# Patient Record
Sex: Male | Born: 1950
Health system: Southern US, Community
[De-identification: ages and names within clinical notes are randomized; demographics above are authoritative.]

## PROBLEM LIST (undated history)

## (undated) DIAGNOSIS — H9319 Tinnitus, unspecified ear: Secondary | ICD-10-CM

## (undated) DIAGNOSIS — K219 Gastro-esophageal reflux disease without esophagitis: Secondary | ICD-10-CM

## (undated) DIAGNOSIS — L989 Disorder of the skin and subcutaneous tissue, unspecified: Secondary | ICD-10-CM

## (undated) DIAGNOSIS — I4891 Unspecified atrial fibrillation: Secondary | ICD-10-CM

## (undated) DIAGNOSIS — J45909 Unspecified asthma, uncomplicated: Secondary | ICD-10-CM

## (undated) DIAGNOSIS — H409 Unspecified glaucoma: Secondary | ICD-10-CM

## (undated) DIAGNOSIS — R519 Headache, unspecified: Secondary | ICD-10-CM

## (undated) DIAGNOSIS — K439 Ventral hernia without obstruction or gangrene: Secondary | ICD-10-CM

## (undated) DIAGNOSIS — R51 Headache: Secondary | ICD-10-CM

## (undated) DIAGNOSIS — E785 Hyperlipidemia, unspecified: Secondary | ICD-10-CM

## (undated) DIAGNOSIS — I428 Other cardiomyopathies: Secondary | ICD-10-CM

## (undated) DIAGNOSIS — I454 Nonspecific intraventricular block: Secondary | ICD-10-CM

## (undated) DIAGNOSIS — G459 Transient cerebral ischemic attack, unspecified: Secondary | ICD-10-CM

## (undated) HISTORY — DX: Tinnitus, unspecified ear: H93.19

## (undated) HISTORY — DX: Unspecified glaucoma: H40.9

## (undated) HISTORY — DX: Unspecified asthma, uncomplicated: J45.909

## (undated) HISTORY — DX: Unspecified atrial fibrillation: I48.91

## (undated) HISTORY — DX: Nonspecific intraventricular block: I45.4

## (undated) HISTORY — PX: TONSILLECTOMY: SUR1361

## (undated) HISTORY — DX: Gastro-esophageal reflux disease without esophagitis: K21.9

## (undated) HISTORY — DX: Disorder of the skin and subcutaneous tissue, unspecified: L98.9

## (undated) HISTORY — PX: MOLE REMOVAL: SHX2046

---
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
## 1998-11-02 ENCOUNTER — Encounter: Payer: Self-pay | Admitting: Emergency Medicine

## 1998-11-02 ENCOUNTER — Inpatient Hospital Stay (HOSPITAL_COMMUNITY): Admission: EM | Admit: 1998-11-02 | Discharge: 1998-11-11 | Payer: Self-pay | Admitting: Emergency Medicine

## 1998-11-03 ENCOUNTER — Encounter: Payer: Self-pay | Admitting: Pulmonary Disease

## 1998-11-05 ENCOUNTER — Encounter: Payer: Self-pay | Admitting: Neurology

## 1998-11-05 ENCOUNTER — Encounter: Payer: Self-pay | Admitting: Pulmonary Disease

## 1999-02-27 ENCOUNTER — Ambulatory Visit (HOSPITAL_COMMUNITY): Admission: RE | Admit: 1999-02-27 | Discharge: 1999-02-27 | Payer: Self-pay | Admitting: Unknown Physician Specialty

## 2003-07-28 ENCOUNTER — Emergency Department (HOSPITAL_COMMUNITY): Admission: EM | Admit: 2003-07-28 | Discharge: 2003-07-28 | Payer: Self-pay | Admitting: Emergency Medicine

## 2003-07-28 ENCOUNTER — Encounter: Payer: Self-pay | Admitting: Emergency Medicine

## 2016-08-04 DIAGNOSIS — Z23 Encounter for immunization: Secondary | ICD-10-CM | POA: Diagnosis not present

## 2016-12-11 HISTORY — PX: OTHER SURGICAL HISTORY: SHX169

## 2017-01-21 DIAGNOSIS — H527 Unspecified disorder of refraction: Secondary | ICD-10-CM | POA: Diagnosis not present

## 2017-01-21 DIAGNOSIS — H524 Presbyopia: Secondary | ICD-10-CM | POA: Diagnosis not present

## 2017-05-27 DIAGNOSIS — K439 Ventral hernia without obstruction or gangrene: Secondary | ICD-10-CM | POA: Diagnosis not present

## 2017-06-25 ENCOUNTER — Other Ambulatory Visit: Payer: Self-pay | Admitting: General Surgery

## 2017-06-25 DIAGNOSIS — K429 Umbilical hernia without obstruction or gangrene: Secondary | ICD-10-CM | POA: Diagnosis not present

## 2017-07-01 DIAGNOSIS — Z0000 Encounter for general adult medical examination without abnormal findings: Secondary | ICD-10-CM | POA: Diagnosis not present

## 2017-07-01 DIAGNOSIS — Z1329 Encounter for screening for other suspected endocrine disorder: Secondary | ICD-10-CM | POA: Diagnosis not present

## 2017-07-01 DIAGNOSIS — E559 Vitamin D deficiency, unspecified: Secondary | ICD-10-CM | POA: Diagnosis not present

## 2017-07-01 DIAGNOSIS — E785 Hyperlipidemia, unspecified: Secondary | ICD-10-CM | POA: Diagnosis not present

## 2017-07-01 DIAGNOSIS — N400 Enlarged prostate without lower urinary tract symptoms: Secondary | ICD-10-CM | POA: Diagnosis not present

## 2017-07-09 DIAGNOSIS — R972 Elevated prostate specific antigen [PSA]: Secondary | ICD-10-CM | POA: Diagnosis not present

## 2017-07-17 NOTE — Pre-Procedure Instructions (Signed)
Randall Best  07/17/2017     No Pharmacies Listed   Your procedure is scheduled on July 22, 2017.  Report to [facility] Admitting at 800 AM.  Call this number if you have problems the morning of surgery:  [phone]   Remember:  Do not eat food or drink liquids after midnight.  Take these medicines the morning of surgery with A SIP OF WATER diphendhydramine (benadryl)-if needed, fluticasone (flonase) nasal spray-if needed  7 days prior to surgery STOP taking any Aspirin, Aleve, Naproxen, Ibuprofen, Motrin, Advil, Goody's, BC's, all herbal medications, fish oil, and all vitamins   Do not wear jewelry, make-up or nail polish.  Do not wear lotions, powders, or perfumes, or deoderant.             Men may shave face and neck.  Do not bring valuables to the hospital.  [facility] is not responsible for any belongings or valuables.  Contacts, dentures or bridgework may not be worn into surgery.  Leave your suitcase in the car.  After surgery it may be brought to your room.  For patients admitted to the hospital, discharge time will be determined by your treatment team.  Patients discharged the day of surgery will not be allowed to drive home.   Special instructions:   - Preparing For Surgery  Before surgery, you can play an important role. Because skin is not sterile, your skin needs to be as free of germs as possible. You can reduce the number of germs on your skin by washing with CHG (chlorahexidine gluconate) Soap before surgery.  CHG is an antiseptic cleaner which kills germs and bonds with the skin to continue killing germs even after washing.  Please do not use if you have an allergy to CHG or antibacterial soaps. If your skin becomes reddened/irritated stop using the CHG.  Do not shave (including legs and underarms) for at least 48 hours prior to first CHG shower. It is OK to shave your face.  Please follow these instructions  carefully.   1. Shower the NIGHT BEFORE SURGERY and the MORNING OF SURGERY with CHG.   2. If you chose to wash your hair, wash your hair first as usual with your normal shampoo.  3. After you shampoo, rinse your hair and body thoroughly to remove the shampoo.  4. Use CHG as you would any other liquid soap. You can apply CHG directly to the skin and wash gently with a scrungie or a clean washcloth.   5. Apply the CHG Soap to your body ONLY FROM THE NECK DOWN.  Do not use on open wounds or open sores. Avoid contact with your eyes, ears, mouth and genitals (private parts). Wash genitals (private parts) with your normal soap.  6. Wash thoroughly, paying special attention to the area where your surgery will be performed.  7. Thoroughly rinse your body with warm water from the neck down.  8. DO NOT shower/wash with your normal soap after using and rinsing off the CHG Soap.  9. Pat yourself dry with a CLEAN TOWEL.   10. Wear CLEAN PAJAMAS   11. Place CLEAN SHEETS on your bed the night of your first shower and DO NOT SLEEP WITH PETS.    Day of Surgery: Do not apply any deodorants/lotions. Please wear clean clothes to the hospital/surgery center.     Please read over the following fact sheets that you were given. Pain Booklet, Coughing and Deep Breathing and  Surgical Site Infection Prevention

## 2017-07-20 ENCOUNTER — Encounter (HOSPITAL_COMMUNITY)
Admission: RE | Admit: 2017-07-20 | Discharge: 2017-07-20 | Disposition: A | Discharge status: Home / Self Care | Payer: BLUE CROSS/BLUE SHIELD | Source: Ambulatory Visit | Attending: General Surgery | Admitting: General Surgery

## 2017-07-20 ENCOUNTER — Encounter (HOSPITAL_COMMUNITY): Payer: Self-pay

## 2017-07-20 DIAGNOSIS — Z888 Allergy status to other drugs, medicaments and biological substances status: Secondary | ICD-10-CM | POA: Diagnosis not present

## 2017-07-20 DIAGNOSIS — Z23 Encounter for immunization: Secondary | ICD-10-CM | POA: Diagnosis not present

## 2017-07-20 DIAGNOSIS — K567 Ileus, unspecified: Secondary | ICD-10-CM | POA: Diagnosis not present

## 2017-07-20 DIAGNOSIS — K439 Ventral hernia without obstruction or gangrene: Secondary | ICD-10-CM | POA: Diagnosis not present

## 2017-07-20 DIAGNOSIS — G8918 Other acute postprocedural pain: Secondary | ICD-10-CM | POA: Diagnosis not present

## 2017-07-20 DIAGNOSIS — E785 Hyperlipidemia, unspecified: Secondary | ICD-10-CM | POA: Diagnosis not present

## 2017-07-20 HISTORY — DX: Hyperlipidemia, unspecified: E78.5

## 2017-07-20 HISTORY — DX: Headache, unspecified: R51.9

## 2017-07-20 HISTORY — DX: Headache: R51

## 2017-07-20 LAB — CBC
HCT: 44.5 % (ref 39.0–52.0)
Hemoglobin: 14.3 g/dL (ref 13.0–17.0)
MCH: 29.2 pg (ref 26.0–34.0)
MCHC: 32.1 g/dL (ref 30.0–36.0)
MCV: 91 fL (ref 78.0–100.0)
PLATELETS: 191 10*3/uL (ref 150–400)
RBC: 4.89 MIL/uL (ref 4.22–5.81)
RDW: 13.6 % (ref 11.5–15.5)
WBC: 7.1 10*3/uL (ref 4.0–10.5)

## 2017-07-20 LAB — BASIC METABOLIC PANEL
Anion gap: 7 (ref 5–15)
BUN: 12 mg/dL (ref 6–20)
CO2: 25 mmol/L (ref 22–32)
CREATININE: 1.04 mg/dL (ref 0.61–1.24)
Calcium: 9.1 mg/dL (ref 8.9–10.3)
Chloride: 108 mmol/L (ref 101–111)
GFR calc Af Amer: >60 mL/min (ref >60)
GLUCOSE: 103 mg/dL — AB (ref 65–99)
Potassium: 4 mmol/L (ref 3.5–5.1)
SODIUM: 140 mmol/L (ref 135–145)

## 2017-07-20 NOTE — Pre-Procedure Instructions (Addendum)
Randall Best  07/20/2017     No Pharmacies Listed   Your procedure is scheduled on July 22, 2017.  Report to [facility] Admitting at 800 AM.  Call this number if you have problems the morning of surgery:  [phone]   Remember:  Do not eat food or drink liquids after midnight. drink boost breeze drink am of surgery at 600am  Take these medicines the morning of surgery with A SIP OF WATER diphendhydramine (benadryl)-if needed, fluticasone (flonase) nasal spray-if needed  Prior surgery STOP(today) taking any Aspirin, Aleve, Naproxen, Ibuprofen, Motrin, Advil, Goody's, BC's, all herbal medications, fish oil, and all vitamins   Do not wear jewelry, make-up or nail polish.  Do not wear lotions, powders, or perfumes, or deoderant.             Men may shave face and neck.  Do not bring valuables to the hospital.  [facility] is not responsible for any belongings or valuables.  Contacts, dentures or bridgework may not be worn into surgery.  Leave your suitcase in the car.  After surgery it may be brought to your room.  For patients admitted to the hospital, discharge time will be determined by your treatment team.  Patients discharged the day of surgery will not be allowed to drive home.   Special instructions:   [place]- Preparing For Surgery  Before surgery, you can play an important role. Because skin is not sterile, your skin needs to be as free of germs as possible. You can reduce the number of germs on your skin by washing with CHG (chlorahexidine gluconate) Soap before surgery.  CHG is an antiseptic cleaner which kills germs and bonds with the skin to continue killing germs even after washing.  Please do not use if you have an allergy to CHG or antibacterial soaps. If your skin becomes reddened/irritated stop using the CHG.  Do not shave (including legs and underarms) for at least 48 hours prior to first CHG shower. It is OK to shave your  face.  Please follow these instructions carefully.   1. Shower the NIGHT BEFORE SURGERY and the MORNING OF SURGERY with CHG.   2. If you chose to wash your hair, wash your hair first as usual with your normal shampoo.  3. After you shampoo, rinse your hair and body thoroughly to remove the shampoo.  4. Use CHG as you would any other liquid soap. You can apply CHG directly to the skin and wash gently with a scrungie or a clean washcloth.   5. Apply the CHG Soap to your body ONLY FROM THE NECK DOWN.  Do not use on open wounds or open sores. Avoid contact with your eyes, ears, mouth and genitals (private parts). Wash genitals (private parts) with your normal soap.  6. Wash thoroughly, paying special attention to the area where your surgery will be performed.  7. Thoroughly rinse your body with warm water from the neck down.  8. DO NOT shower/wash with your normal soap after using and rinsing off the CHG Soap.  9. Pat yourself dry with a CLEAN TOWEL.   10. Wear CLEAN PAJAMAS   11. Place CLEAN SHEETS on your bed the night of your first shower and DO NOT SLEEP WITH PETS.    Day of Surgery: Do not apply any deodorants/lotions. Please wear clean clothes to the hospital/surgery center.     Please read over the following fact sheets that you were given. Pain  Booklet, Coughing and Deep Breathing and Surgical Site Infection Prevention

## 2017-07-21 ENCOUNTER — Encounter (HOSPITAL_COMMUNITY): Payer: Self-pay

## 2017-07-21 NOTE — Progress Notes (Signed)
Anesthesia Chart Review:  Pt is a 66 year old male scheduled for open ventral hernia repair with mesh on 07/22/2017 with [name], MD  - PCP is Starlyn Skeans, PA  PMH includes:  Headache. Never smoker. BMI 30  Medications reviewed.  BP 117/84   Pulse 73   Temp 36.8 C (Oral)   Resp 18   Ht 5\' 10"  (1.778 m)   Wt 207 lb 4 oz (94 kg)   SpO2 100%   BMI 29.74 kg/m    Preoperative labs reviewed.  EKG 07/20/17: Sinus bradycardia (50 bpm) with 1st degree A-V block. Incomplete LBBB. Nonspecific T wave abnormality  If no changes, I anticipate pt can proceed with surgery as scheduled.   Willeen Cass, FNP-BC [facility] Short Stay Surgical Center/Anesthesiology Phone: [phone] 07/21/2017 10:14 AM

## 2017-07-22 ENCOUNTER — Inpatient Hospital Stay (HOSPITAL_COMMUNITY): Payer: BLUE CROSS/BLUE SHIELD | Admitting: Emergency Medicine

## 2017-07-22 ENCOUNTER — Encounter (HOSPITAL_COMMUNITY)
Admission: RE | Disposition: A | Discharge status: Home / Self Care | Payer: Self-pay | Source: Ambulatory Visit | Attending: General Surgery

## 2017-07-22 ENCOUNTER — Inpatient Hospital Stay (HOSPITAL_COMMUNITY)
Admission: RE | Admit: 2017-07-22 | Discharge: 2017-07-27 | DRG: 354 | Disposition: A | Discharge status: Home / Self Care | Payer: BLUE CROSS/BLUE SHIELD | Source: Ambulatory Visit | Attending: General Surgery | Admitting: General Surgery

## 2017-07-22 ENCOUNTER — Inpatient Hospital Stay (HOSPITAL_COMMUNITY): Payer: BLUE CROSS/BLUE SHIELD | Admitting: Anesthesiology

## 2017-07-22 ENCOUNTER — Encounter (HOSPITAL_COMMUNITY): Payer: Self-pay

## 2017-07-22 DIAGNOSIS — K567 Ileus, unspecified: Secondary | ICD-10-CM | POA: Diagnosis not present

## 2017-07-22 DIAGNOSIS — Z888 Allergy status to other drugs, medicaments and biological substances status: Secondary | ICD-10-CM | POA: Diagnosis not present

## 2017-07-22 DIAGNOSIS — Z23 Encounter for immunization: Secondary | ICD-10-CM

## 2017-07-22 DIAGNOSIS — G8918 Other acute postprocedural pain: Secondary | ICD-10-CM | POA: Diagnosis not present

## 2017-07-22 DIAGNOSIS — K439 Ventral hernia without obstruction or gangrene: Principal | ICD-10-CM | POA: Diagnosis present

## 2017-07-22 DIAGNOSIS — E785 Hyperlipidemia, unspecified: Secondary | ICD-10-CM | POA: Diagnosis present

## 2017-07-22 HISTORY — DX: Ventral hernia without obstruction or gangrene: K43.9

## 2017-07-22 HISTORY — PX: VENTRAL HERNIA REPAIR: SHX424

## 2017-07-22 HISTORY — PX: INSERTION OF MESH: SHX5868

## 2017-07-22 SURGERY — REPAIR, HERNIA, VENTRAL
Anesthesia: General | Site: Abdomen

## 2017-07-22 MED ORDER — SUGAMMADEX SODIUM 200 MG/2ML IV SOLN
INTRAVENOUS | Status: DC | PRN
  Administered 2017-07-22: 350 mg via INTRAVENOUS

## 2017-07-22 MED ORDER — LACTATED RINGERS IV SOLN
INTRAVENOUS | Status: DC | PRN
  Administered 2017-07-22 (×2): via INTRAVENOUS

## 2017-07-22 MED ORDER — MIDAZOLAM HCL 5 MG/5ML IJ SOLN
INTRAMUSCULAR | Status: DC | PRN
  Administered 2017-07-22: 2 mg via INTRAVENOUS

## 2017-07-22 MED ORDER — OXYCODONE HCL 5 MG PO TABS: 5.0000 mg | ORAL_TABLET | Freq: Once | ORAL | Status: DC | PRN

## 2017-07-22 MED ORDER — LIDOCAINE 2% (20 MG/ML) 5 ML SYRINGE
INTRAMUSCULAR | Status: AC
  Filled 2017-07-22: qty 25

## 2017-07-22 MED ORDER — PHENYLEPHRINE 40 MCG/ML (10ML) SYRINGE FOR IV PUSH (FOR BLOOD PRESSURE SUPPORT)
PREFILLED_SYRINGE | INTRAVENOUS | Status: AC
  Filled 2017-07-22: qty 20

## 2017-07-22 MED ORDER — ONDANSETRON 4 MG PO TBDP: 4.0000 mg | ORAL_TABLET | Freq: Four times a day (QID) | ORAL | Status: DC | PRN

## 2017-07-22 MED ORDER — ONDANSETRON HCL 4 MG/2ML IJ SOLN
INTRAMUSCULAR | Status: AC
  Filled 2017-07-22: qty 8

## 2017-07-22 MED ORDER — GABAPENTIN 300 MG PO CAPS
300.0000 mg | ORAL_CAPSULE | ORAL | Status: AC
  Administered 2017-07-22: 300 mg via ORAL
  Filled 2017-07-22: qty 1

## 2017-07-22 MED ORDER — FENTANYL CITRATE (PF) 100 MCG/2ML IJ SOLN
INTRAMUSCULAR | Status: AC
  Administered 2017-07-22: 100 ug
  Filled 2017-07-22: qty 2

## 2017-07-22 MED ORDER — SIMETHICONE 80 MG PO CHEW
40.0000 mg | CHEWABLE_TABLET | Freq: Four times a day (QID) | ORAL | Status: DC | PRN
  Administered 2017-07-23 – 2017-07-24 (×2): 40 mg via ORAL
  Filled 2017-07-22 (×2): qty 1

## 2017-07-22 MED ORDER — PHENYLEPHRINE HCL 10 MG/ML IJ SOLN
INTRAMUSCULAR | Status: DC | PRN
  Administered 2017-07-22 (×2): 80 ug via INTRAVENOUS

## 2017-07-22 MED ORDER — ONDANSETRON HCL 4 MG/2ML IJ SOLN
INTRAMUSCULAR | Status: DC | PRN
  Administered 2017-07-22: 4 mg via INTRAVENOUS

## 2017-07-22 MED ORDER — FLUTICASONE PROPIONATE 50 MCG/ACT NA SUSP: 1.0000 | Freq: Every day | NASAL | Status: DC | PRN

## 2017-07-22 MED ORDER — MIDAZOLAM HCL 2 MG/2ML IJ SOLN
INTRAMUSCULAR | Status: AC
  Filled 2017-07-22: qty 2

## 2017-07-22 MED ORDER — PROPOFOL 10 MG/ML IV BOLUS
INTRAVENOUS | Status: DC | PRN
  Administered 2017-07-22: 160 mg via INTRAVENOUS

## 2017-07-22 MED ORDER — 0.9 % SODIUM CHLORIDE (POUR BTL) OPTIME
TOPICAL | Status: DC | PRN
  Administered 2017-07-22: 1000 mL

## 2017-07-22 MED ORDER — OXYCODONE HCL 5 MG PO TABS
5.0000 mg | ORAL_TABLET | ORAL | Status: DC | PRN
  Administered 2017-07-23 – 2017-07-26 (×14): 10 mg via ORAL
  Filled 2017-07-22 (×16): qty 2

## 2017-07-22 MED ORDER — FENTANYL CITRATE (PF) 100 MCG/2ML IJ SOLN
INTRAMUSCULAR | Status: DC | PRN
  Administered 2017-07-22: 100 ug via INTRAVENOUS
  Administered 2017-07-22 (×3): 50 ug via INTRAVENOUS

## 2017-07-22 MED ORDER — CEFAZOLIN SODIUM-DEXTROSE 2-4 GM/100ML-% IV SOLN
2.0000 g | INTRAVENOUS | Status: AC
  Administered 2017-07-22: 2 g via INTRAVENOUS
  Filled 2017-07-22: qty 100

## 2017-07-22 MED ORDER — LIDOCAINE HCL (CARDIAC) 20 MG/ML IV SOLN
INTRAVENOUS | Status: DC | PRN
  Administered 2017-07-22: 50 mg via INTRAVENOUS

## 2017-07-22 MED ORDER — ROCURONIUM BROMIDE 100 MG/10ML IV SOLN
INTRAVENOUS | Status: DC | PRN
  Administered 2017-07-22 (×2): 10 mg via INTRAVENOUS
  Administered 2017-07-22: 50 mg via INTRAVENOUS

## 2017-07-22 MED ORDER — DEXAMETHASONE SODIUM PHOSPHATE 10 MG/ML IJ SOLN
INTRAMUSCULAR | Status: AC
  Filled 2017-07-22: qty 3

## 2017-07-22 MED ORDER — MORPHINE SULFATE (PF) 4 MG/ML IV SOLN
2.0000 mg | INTRAVENOUS | Status: DC | PRN
  Administered 2017-07-22 – 2017-07-23 (×8): 2 mg via INTRAVENOUS
  Filled 2017-07-22 (×8): qty 1

## 2017-07-22 MED ORDER — DEXAMETHASONE SODIUM PHOSPHATE 10 MG/ML IJ SOLN
INTRAMUSCULAR | Status: DC | PRN
  Administered 2017-07-22 (×2): 10 mg via INTRAVENOUS

## 2017-07-22 MED ORDER — CHLORHEXIDINE GLUCONATE CLOTH 2 % EX PADS
6.0000 | MEDICATED_PAD | Freq: Once | CUTANEOUS | Status: DC
  Administered 2017-07-22: 6 via TOPICAL

## 2017-07-22 MED ORDER — BUPIVACAINE-EPINEPHRINE (PF) 0.5% -1:200000 IJ SOLN
INTRAMUSCULAR | Status: DC | PRN
  Administered 2017-07-22 (×2): 20 mL via PERINEURAL

## 2017-07-22 MED ORDER — SODIUM CHLORIDE 0.9 % IV SOLN
INTRAVENOUS | Status: DC
  Administered 2017-07-22: 13:00:00 via INTRAVENOUS

## 2017-07-22 MED ORDER — HEPARIN SODIUM (PORCINE) 5000 UNIT/ML IJ SOLN
5000.0000 [IU] | Freq: Once | INTRAMUSCULAR | Status: AC
  Administered 2017-07-22: 5000 [IU] via SUBCUTANEOUS
  Filled 2017-07-22: qty 1

## 2017-07-22 MED ORDER — EPHEDRINE 5 MG/ML INJ
INTRAVENOUS | Status: AC
  Filled 2017-07-22: qty 20

## 2017-07-22 MED ORDER — SODIUM CHLORIDE 0.9 % IJ SOLN
INTRAMUSCULAR | Status: AC
  Filled 2017-07-22: qty 10

## 2017-07-22 MED ORDER — ACETAMINOPHEN 500 MG PO TABS
1000.0000 mg | ORAL_TABLET | Freq: Four times a day (QID) | ORAL | Status: DC
  Administered 2017-07-22 – 2017-07-27 (×20): 1000 mg via ORAL
  Filled 2017-07-22 (×18): qty 2

## 2017-07-22 MED ORDER — ACETAMINOPHEN 500 MG PO TABS
1000.0000 mg | ORAL_TABLET | ORAL | Status: AC
  Administered 2017-07-22: 1000 mg via ORAL
  Filled 2017-07-22: qty 2

## 2017-07-22 MED ORDER — FENTANYL CITRATE (PF) 250 MCG/5ML IJ SOLN
INTRAMUSCULAR | Status: AC
  Filled 2017-07-22: qty 5

## 2017-07-22 MED ORDER — ONDANSETRON HCL 4 MG/2ML IJ SOLN: 4.0000 mg | Freq: Four times a day (QID) | INTRAMUSCULAR | Status: DC | PRN

## 2017-07-22 MED ORDER — FENTANYL CITRATE (PF) 100 MCG/2ML IJ SOLN
25.0000 ug | INTRAMUSCULAR | Status: DC | PRN
  Administered 2017-07-22 (×4): 25 ug via INTRAVENOUS

## 2017-07-22 MED ORDER — OXYCODONE HCL 5 MG/5ML PO SOLN: 5.0000 mg | Freq: Once | ORAL | Status: DC | PRN

## 2017-07-22 MED ORDER — METHOCARBAMOL 500 MG PO TABS: 500.0000 mg | ORAL_TABLET | Freq: Four times a day (QID) | ORAL | Status: DC | PRN

## 2017-07-22 MED ORDER — ENOXAPARIN SODIUM 40 MG/0.4ML ~~LOC~~ SOLN
40.0000 mg | SUBCUTANEOUS | Status: DC
  Administered 2017-07-22 – 2017-07-27 (×5): 40 mg via SUBCUTANEOUS
  Filled 2017-07-22 (×5): qty 0.4

## 2017-07-22 MED ORDER — PROPOFOL 10 MG/ML IV BOLUS
INTRAVENOUS | Status: AC
  Filled 2017-07-22: qty 20

## 2017-07-22 MED ORDER — ENSURE PRE-SURGERY PO LIQD
592.0000 mL | Freq: Once | ORAL | Status: DC
  Filled 2017-07-22: qty 592

## 2017-07-22 SURGICAL SUPPLY — 38 items
BINDER ABDOMINAL 12 ML 46-62 (SOFTGOODS) ×2 IMPLANT
BIOPATCH RED 1 DISK 7.0 (GAUZE/BANDAGES/DRESSINGS) ×2 IMPLANT
BLADE CLIPPER SURG (BLADE) ×2 IMPLANT
BLADE SURG 11 STRL SS (BLADE) ×2 IMPLANT
CANISTER SUCT 3000ML PPV (MISCELLANEOUS) ×2 IMPLANT
CHLORAPREP W/TINT 26ML (MISCELLANEOUS) ×2 IMPLANT
COVER SURGICAL LIGHT HANDLE (MISCELLANEOUS) ×2 IMPLANT
DERMABOND ADVANCED (GAUZE/BANDAGES/DRESSINGS) ×1
DERMABOND ADVANCED .7 DNX12 (GAUZE/BANDAGES/DRESSINGS) ×1 IMPLANT
DEVICE TROCAR PUNCTURE CLOSURE (ENDOMECHANICALS) ×2 IMPLANT
DRAIN CHANNEL 19F RND (DRAIN) ×2 IMPLANT
DRAPE LAPAROSCOPIC ABDOMINAL (DRAPES) ×2 IMPLANT
DRSG OPSITE POSTOP 4X8 (GAUZE/BANDAGES/DRESSINGS) ×2 IMPLANT
DRSG TEGADERM 4X4.75 (GAUZE/BANDAGES/DRESSINGS) ×4 IMPLANT
ELECT CAUTERY BLADE 6.4 (BLADE) ×2 IMPLANT
ELECT REM PT RETURN 9FT ADLT (ELECTROSURGICAL) ×2
ELECTRODE REM PT RTRN 9FT ADLT (ELECTROSURGICAL) ×1 IMPLANT
EVACUATOR SILICONE 100CC (DRAIN) ×2 IMPLANT
GLOVE BIO SURGEON STRL SZ7 (GLOVE) ×2 IMPLANT
GLOVE BIOGEL PI IND STRL 7.5 (GLOVE) ×1 IMPLANT
GLOVE BIOGEL PI INDICATOR 7.5 (GLOVE) ×1
GOWN STRL REUS W/ TWL LRG LVL3 (GOWN DISPOSABLE) ×2 IMPLANT
GOWN STRL REUS W/TWL LRG LVL3 (GOWN DISPOSABLE) ×2
KIT BASIN OR (CUSTOM PROCEDURE TRAY) ×2 IMPLANT
KIT ROOM TURNOVER OR (KITS) ×2 IMPLANT
MESH HERNIA 10X14 SHEET (Mesh General) ×2 IMPLANT
NS IRRIG 1000ML POUR BTL (IV SOLUTION) ×2 IMPLANT
PACK GENERAL/GYN (CUSTOM PROCEDURE TRAY) ×2 IMPLANT
PAD ARMBOARD 7.5X6 YLW CONV (MISCELLANEOUS) ×2 IMPLANT
STAPLER VISISTAT 35W (STAPLE) ×2 IMPLANT
SUT ETHIBOND NAB CT1 #1 30IN (SUTURE) ×4 IMPLANT
SUT ETHILON 2 0 FS 18 (SUTURE) ×2 IMPLANT
SUT PDS AB 1 TP1 96 (SUTURE) ×4 IMPLANT
SUT VIC AB 0 CT1 27 (SUTURE) ×6
SUT VIC AB 0 CT1 27XBRD ANBCTR (SUTURE) ×6 IMPLANT
SUT VIC AB 3-0 SH 18 (SUTURE) ×2 IMPLANT
TOWEL OR 17X24 6PK STRL BLUE (TOWEL DISPOSABLE) ×2 IMPLANT
TRAY FOLEY W/METER SILVER 14FR (SET/KITS/TRAYS/PACK) ×2 IMPLANT

## 2017-07-22 NOTE — Anesthesia Procedure Notes (Signed)
Procedure Name: Intubation Date/Time: 07/22/2017 8:43 AM Performed by: Neldon Newport Pre-anesthesia Checklist: Timeout performed, Patient being monitored, Suction available, Emergency Drugs available and Patient identified Patient Re-evaluated:Patient Re-evaluated prior to induction Oxygen Delivery Method: Circle system utilized Preoxygenation: Pre-oxygenation with 100% oxygen Induction Type: IV induction Ventilation: Mask ventilation without difficulty and Oral airway inserted - appropriate to patient size Laryngoscope Size: Mac and 3 Grade View: Grade I Tube type: Oral Tube size: 7.5 mm Number of attempts: 1 Placement Confirmation: breath sounds checked- equal and bilateral,  positive ETCO2 and ETT inserted through vocal cords under direct vision Secured at: 22 cm Tube secured with: Tape Dental Injury: Teeth and Oropharynx as per pre-operative assessment

## 2017-07-22 NOTE — Anesthesia Postprocedure Evaluation (Signed)
Anesthesia Post Note  Patient: Randall Best  Procedure(s) Performed: Procedure(s) (LRB): OPEN VENTRAL HERNIA REPAIR WITH MESH ERAS PATHWAY (N/A) INSERTION OF MESH (N/A)     Patient location during evaluation: PACU Anesthesia Type: General Level of consciousness: awake and alert Pain management: pain level controlled Vital Signs Assessment: post-procedure vital signs reviewed and stable Respiratory status: spontaneous breathing, nonlabored ventilation, respiratory function stable and patient connected to nasal cannula oxygen Cardiovascular status: blood pressure returned to baseline and stable Postop Assessment: no signs of nausea or vomiting Anesthetic complications: no    Last Vitals:  Vitals:   07/22/17 1208 07/22/17 1252  BP: 123/87 (!) 117/101  Pulse: (!) 49 94  Resp: 11   Temp:  36.7 C  SpO2: 95% 94%    Last Pain:  Vitals:   07/22/17 1252  TempSrc: Oral  PainSc: [place]

## 2017-07-22 NOTE — Interval H&P Note (Signed)
History and Physical Interval Note:  07/22/2017 7:44 AM  Randall Best  has presented today for surgery, with the diagnosis of ventral hernia  The various methods of treatment have been discussed with the patient and family. After consideration of risks, benefits and other options for treatment, the patient has consented to  Procedure(s) with comments: [place] (N/A) - BILATERAL TAP BLOCK INSERTION OF MESH (N/A) - BILATERAL TAP BLOCK as a surgical intervention .  The patient's history has been reviewed, patient examined, no change in status, stable for surgery.  I have reviewed the patient's chart and labs.  Questions were answered to the patient's satisfaction.     Johannes Everage

## 2017-07-22 NOTE — H&P (Signed)
Randall Best is an 66 y.o. male.   Chief Complaint: ventral hernia HPI: 58 yom referred by Randall Best for ventral hernia. this has been present for over one year. this has gotten bigger and is causing discomfort. having bms, no issues with n/v. usually reduces when lying down. he is not a smoker. has no prior abdominal surgery   Past Medical History:  Diagnosis Date  . Headache    hx  . Hyperlipidemia   . Ventral hernia     Past Surgical History:  Procedure Laterality Date  . TONSILLECTOMY      History reviewed. No pertinent family history. Social History:  reports that he has never smoked. He has never used smokeless tobacco. He reports that he drinks alcohol. He reports that he does not use drugs.  Allergies:  Allergies  Allergen Reactions  . Ezetimibe Other (See Comments)    Broke out in sores  . Omeprazole Nausea And Vomiting  . Statins Other (See Comments)    Muscle pains    Medications Prior to Admission  Medication Sig Dispense Refill  . Cholecalciferol (VITAMIN D-3) 5000 units TABS Take 5,000 Units by mouth daily.    . diphenhydrAMINE (BENADRYL) 25 MG tablet Take 25 mg by mouth daily as needed (for fluid behind eardrum).    . fluticasone (FLONASE) 50 MCG/ACT nasal spray Place 1 spray into both nostrils daily as needed (for fluid behing eardrum).    . Omega-3 Fatty Acids (FISH OIL) 1200 MG CAPS Take 1,200 mg by mouth daily.      Results for orders placed or performed during the hospital encounter of 07/20/17 (from the past 48 hour(s))  CBC     Status: None   Collection Time: 07/20/17  9:45 AM  Result Value Ref Range   WBC 7.1 4.0 - 10.5 K/uL   RBC 4.89 4.22 - 5.81 MIL/uL   Hemoglobin 14.3 13.0 - 17.0 g/dL   HCT 44.5 39.0 - 52.0 %   MCV 91.0 78.0 - 100.0 fL   MCH 29.2 26.0 - 34.0 pg   MCHC 32.1 30.0 - 36.0 g/dL   RDW 13.6 11.5 - 15.5 %   Platelets 191 150 - 400 K/uL  Basic metabolic panel     Status: Abnormal   Collection Time: 07/20/17  9:45 AM   Result Value Ref Range   Sodium 140 135 - 145 mmol/L   Potassium 4.0 3.5 - 5.1 mmol/L   Chloride 108 101 - 111 mmol/L   CO2 25 22 - 32 mmol/L   Glucose, Bld 103 (H) 65 - 99 mg/dL   BUN 12 6 - 20 mg/dL   Creatinine, Ser 1.04 0.61 - 1.24 mg/dL   Calcium 9.1 8.9 - 10.3 mg/dL   GFR calc non Af Amer >60 >60 mL/min   GFR calc Af Amer >60 >60 mL/min    Comment: (NOTE) The eGFR has been calculated using the CKD EPI equation. This calculation has not been validated in all clinical situations. eGFR's persistently <60 mL/min signify possible Chronic Kidney Disease.    Anion gap 7 5 - 15   No results found.  ROS Review of Systems ([name] RMA; 06/25/2017 1:53 PM) General Not Present- Appetite Loss, Chills, Fatigue, Fever, Night Sweats, Weight Gain and Weight Loss. Skin Present- Non-Healing Wounds. Not Present- Change in Wart/Mole, Dryness, Hives, Jaundice, New Lesions, Rash and Ulcer. HEENT Present- Earache, Ringing in the Ears, Seasonal Allergies, Sinus Pain and Wears glasses/contact lenses. Not Present- Hearing Loss, Hoarseness, Nose  Bleed, Oral Ulcers, Sore Throat, Visual Disturbances and Yellow Eyes. Respiratory Not Present- Bloody sputum, Chronic Cough, Difficulty Breathing, Snoring and Wheezing. Cardiovascular Not Present- Chest Pain, Difficulty Breathing Lying Down, Leg Cramps, Palpitations, Rapid Heart Rate, Shortness of Breath and Swelling of Extremities. Gastrointestinal Not Present- Abdominal Pain, Bloating, Bloody Stool, Change in Bowel Habits, Chronic diarrhea, Constipation, Difficulty Swallowing, Excessive gas, Gets full quickly at meals, Hemorrhoids, Indigestion, Nausea, Rectal Pain and Vomiting. Male Genitourinary Not Present- Blood in Urine, Change in Urinary Stream, Frequency, Impotence, Nocturia, Painful Urination, Urgency and Urine Leakage. Musculoskeletal Not Present- Back Pain, Joint Pain, Joint Stiffness, Muscle Pain, Muscle Weakness and Swelling of  Extremities. Neurological Not Present- Decreased Memory, Fainting, Headaches, Numbness, Seizures, Tingling, Tremor, Trouble walking and Weakness. Psychiatric Not Present- Anxiety, Bipolar, Change in Sleep Pattern, Depression, Fearful and Frequent crying. Endocrine Not Present- Cold Intolerance, Excessive Hunger, Hair Changes, Heat Intolerance, Hot flashes and New Diabetes. Hematology Not Present- Blood Thinners, Easy Bruising, Excessive bleeding, Gland problems, HIV and Persistent Infections.  Blood pressure 116/81, pulse 63, temperature 98.7 F (37.1 C), temperature source Oral, resp. rate 18, height '5\' 10"'$  (1.778 m), weight 93.9 kg (207 lb), SpO2 99 %. Physical Exam  Vitals ([name] RMA; 06/25/2017 1:54 PM) 06/25/2017 1:53 PM Weight: 207.2 lb Height: 70in Body Surface Area: 2.12 m Body Mass Index: 29.73 kg/m  Temp.: 97.71F  Pulse: 79 (Regular)  P.OX: 97% (Room air) BP: 122/84 (Sitting, Left Arm, Standard Physical Exam [name] MD; 06/25/2017 2:29 PM) General Mental Status-Alert. Orientation-Oriented X3. Eye Sclera/Conjunctiva - Bilateral-No scleral icterus. Chest and Lung Exam Chest and lung exam reveals -quiet, even and easy respiratory effort with no use of accessory muscles and on auscultation, normal breath sounds, no adventitious sounds and normal vocal resonance. Cardiovascular Cardiovascular examination reveals -normal heart sounds, regular rate and rhythm with no murmurs. Abdomen Note: 4 cm reducible nontender supraumbilical hernia diastasis recti  Assessment/Plan UMBILICAL HERNIA WITHOUT OBSTRUCTION OR GANGRENE (K42.9) Story: Open retrorectus repair of ventral hernia with mesh. We discussed options for repair including laparoscopy vs open repair. I think he does need mesh repair given size of hernia. this is pretty symptomatic now so needs to be repaired although weight loss would be ideal. I discussed open repair with retrorectus  mesh placement. recovery discussed as well as hospital stay. risks include but not limited to bleeding, infection, recurrence. will proceed in next several weeks   [name], MD 07/22/2017, 7:42 AM

## 2017-07-22 NOTE — Transfer of Care (Signed)
Immediate Anesthesia Transfer of Care Note  Patient: Randall Best  Procedure(s) Performed: Procedure(s) with comments: OPEN VENTRAL HERNIA REPAIR WITH MESH ERAS PATHWAY (N/A) - BILATERAL TAP BLOCK INSERTION OF MESH (N/A) - BILATERAL TAP BLOCK  Patient Location: PACU  Anesthesia Type:General  Level of Consciousness: awake, alert  and oriented  Airway & Oxygen Therapy: Patient Spontanous Breathing and Patient connected to nasal cannula oxygen  Post-op Assessment: Report given to RN  Post vital signs: Reviewed and stable  Last Vitals:  Vitals:   07/22/17 0654  BP: 116/81  Pulse: 63  Resp: 18  Temp: 37.1 C  SpO2: 99%    Last Pain:  Vitals:   07/22/17 0737  TempSrc:   PainSc: 2       Patients Stated Pain Goal: 0 (33/54/56 2563)  Complications: No apparent anesthesia complications

## 2017-07-22 NOTE — Anesthesia Preprocedure Evaluation (Addendum)
Anesthesia Evaluation  Patient identified by MRN, date of birth, ID band Patient awake    Reviewed: Allergy & Precautions, H&P , NPO status , Patient's Chart, lab work & pertinent test results  Airway Mallampati: II  TM Distance: >3 FB Neck ROM: full    Dental  (+) Teeth Intact, Dental Advidsory Given, Caps Caps on front teeth.:   Pulmonary neg pulmonary ROS,    breath sounds clear to auscultation       Cardiovascular negative cardio ROS   Rhythm:regular Rate:Normal     Neuro/Psych  Headaches,    GI/Hepatic   Endo/Other    Renal/GU      Musculoskeletal   Abdominal   Peds  Hematology   Anesthesia Other Findings   Reproductive/Obstetrics                            Anesthesia Physical Anesthesia Plan  ASA: II  Anesthesia Plan: General   Post-op Pain Management:  Regional for Post-op pain   Induction: Intravenous  PONV Risk Score and Plan: 2 and Ondansetron, Dexamethasone, Midazolam and Treatment may vary due to age or medical condition  Airway Management Planned: Oral ETT  Additional Equipment:   Intra-op Plan:   Post-operative Plan: Extubation in OR  Informed Consent: I have reviewed the patients History and Physical, chart, labs and discussed the procedure including the risks, benefits and alternatives for the proposed anesthesia with the patient or authorized representative who has indicated his/her understanding and acceptance.   Dental Advisory Given  Plan Discussed with: CRNA, Anesthesiologist and Surgeon  Anesthesia Plan Comments:        Anesthesia Quick Evaluation

## 2017-07-22 NOTE — Progress Notes (Signed)
Pt and wife sts that he never received the Ensure prep that was ordered, but he did receive the Boost Breeze for ERAS.

## 2017-07-22 NOTE — Op Note (Signed)
Preoperative diagnosis: ventral hernia Postoperative diagnosis: same as above Procedure: open retrorectus repair ventral hernia with mesh Surgeon: Dr Serita Grammes Anesthesia: general with bilateral TAP block Specimens none Complications none Drains 19 Fr blake drain to retrorectus space ebl per anesthesia record Sponge and needle count correct at end of operation  Indications: This is a 23 yom with a symptomatic ventral hernia. We discussed options and elected to proceed with open ventral hernia repair with mesh.  Procedure: after informed consent was obtained patient was taken to the OR.  He was given antibiotics. SCDs were in place.  He was placed under general anesthesia without complication.  A foley was placed.  He was prepped and draped in standard sterile surgical fashion. Timeout was performed.  I made a midline incision around the hernia.  I carried this down to the fascia. There was a large hernia sac and about a 4x4 cm hernia with diastasis accompanying it.  I entered into the peritoneal cavity and reduced the hernia in its entirety.  This was a supraumbilical hernia.  I then excised the entire hernia sac.  I made an incision on the posterior rectus sheath bilaterally and created this space on both sides.  I connected this inferiorly and superiorly as well. I did not carry this all the way to the xyphoid.  I then closed the peritoneum with 0 vicryl sutures and repaired some holes with 3-0 vicryl sutures.  This was closed completely with any defects and there was little tension when closed.  I then fashioned a piece of bard polypropylene mesh to about 22x 15 cm in size.  I placed 0 vicryl sutures in the four cardinal positions around the mesh and inserted this into the retrorectus space.  I used the endoclose device to pull the sutures up through the abdominal wall. I placed an additional suture in the right lower quadrant to get the mesh to lie flat.  This appeared to be in good position.   I then placed a 19 Fr Blake drain in this space and secured this with a 2-0 nylon.  Eventually a biopatch and tegaderm were placed over the drain.  I then closed the fascia without any tension with #1 PDS suture.  The skin was closed with staples and a dressing placed.  I closed the suture sites with glue.  He tolerated well was extubated and transferred to recovery stable.

## 2017-07-22 NOTE — Anesthesia Procedure Notes (Signed)
Anesthesia Regional Block: TAP block   Pre-Anesthetic Checklist: ,, timeout performed, Correct Patient, Correct Site, Correct Laterality, Correct Procedure, Correct Position, site marked, Risks and benefits discussed,  Surgical consent,  Pre-op evaluation,  At surgeon's request and post-op pain management  Laterality: Left and Right  Prep: chloraprep       Needles:  Injection technique: Single-shot  Needle Type: Echogenic Needle     Needle Length: 9cm  Needle Gauge: 21     Additional Needles:   Procedures: ultrasound guided,,,,,,,,  Narrative:  Start time: 07/22/2017 8:09 AM End time: 07/22/2017 8:26 AM Injection made incrementally with aspirations every 5 mL.  Performed by: Personally  Anesthesiologist: Marc Leichter  Additional Notes: Pt tolerated the procedure well.

## 2017-07-23 ENCOUNTER — Encounter (HOSPITAL_COMMUNITY): Payer: Self-pay | Admitting: General Surgery

## 2017-07-23 LAB — BASIC METABOLIC PANEL
ANION GAP: 7 (ref 5–15)
BUN: 14 mg/dL (ref 6–20)
CALCIUM: 8.6 mg/dL — AB (ref 8.9–10.3)
CO2: 28 mmol/L (ref 22–32)
Chloride: 101 mmol/L (ref 101–111)
Creatinine, Ser: 1.25 mg/dL — ABNORMAL HIGH (ref 0.61–1.24)
GFR, EST NON AFRICAN AMERICAN: 59 mL/min — AB (ref >60)
Glucose, Bld: 125 mg/dL — ABNORMAL HIGH (ref 65–99)
Potassium: 3.4 mmol/L — ABNORMAL LOW (ref 3.5–5.1)
SODIUM: 136 mmol/L (ref 135–145)

## 2017-07-23 MED ORDER — DEXTROSE 5 % IV SOLN
500.0000 mg | Freq: Once | INTRAVENOUS | Status: AC
  Administered 2017-07-23: 500 mg via INTRAVENOUS
  Filled 2017-07-23: qty 5

## 2017-07-23 MED ORDER — METHOCARBAMOL 1000 MG/10ML IJ SOLN
500.0000 mg | Freq: Three times a day (TID) | INTRAVENOUS | Status: DC | PRN
  Administered 2017-07-23 – 2017-07-24 (×4): 500 mg via INTRAVENOUS
  Filled 2017-07-23 (×4): qty 5

## 2017-07-23 MED ORDER — KCL IN DEXTROSE-NACL 20-5-0.45 MEQ/L-%-% IV SOLN
INTRAVENOUS | Status: DC
  Administered 2017-07-23 – 2017-07-27 (×5): via INTRAVENOUS
  Filled 2017-07-23 (×7): qty 1000

## 2017-07-23 MED ORDER — KETOROLAC TROMETHAMINE 15 MG/ML IJ SOLN: 15.0000 mg | Freq: Three times a day (TID) | INTRAMUSCULAR | Status: DC | PRN

## 2017-07-23 MED ORDER — MORPHINE SULFATE (PF) 4 MG/ML IV SOLN
2.0000 mg | INTRAVENOUS | Status: DC | PRN
  Administered 2017-07-23 – 2017-07-25 (×9): 4 mg via INTRAVENOUS
  Filled 2017-07-23 (×9): qty 1

## 2017-07-23 MED ORDER — KETOROLAC TROMETHAMINE 15 MG/ML IJ SOLN
15.0000 mg | Freq: Once | INTRAMUSCULAR | Status: AC
  Administered 2017-07-23: 15 mg via INTRAVENOUS
  Filled 2017-07-23: qty 1

## 2017-07-23 NOTE — Progress Notes (Signed)
Wife assisted Pt with bath.

## 2017-07-23 NOTE — Progress Notes (Signed)
1 Day Post-Op   Subjective/Chief Complaint: Pain not well controlled, some nausea, no emesis, voiding, has been oob   Objective: Vital signs in last 24 hours: Temp:  [97.8 F (36.6 C)-98.1 F (36.7 C)] 97.9 F (36.6 C) (09/13 1058) Pulse Rate:  [47-94] 56 (09/13 1058) Resp:  [8-20] 20 (09/13 1058) BP: (116-141)/(78-101) 128/90 (09/13 1058) SpO2:  [94 %-99 %] 98 % (09/13 1058) Last BM Date: 07/22/17  Intake/Output from previous day: 09/12 0701 - 09/13 0700 In: 1766 [I.V.:1616] Out: 940 [Urine:650; Drains:190; Blood:100] Intake/Output this shift: Total I/O In: -  Out: 450 [Urine:400; Drains:50]  Resp: clear to auscultation bilaterally Cardio: regular rate and rhythm GI: mild distended wound clean drain serosang few bs  Lab Results:  No results for input(s): WBC, HGB, HCT, PLT in the last 72 hours. BMET No results for input(s): NA, K, CL, CO2, GLUCOSE, BUN, CREATININE, CALCIUM in the last 72 hours. PT/INR No results for input(s): LABPROT, INR in the last 72 hours. ABG No results for input(s): PHART, HCO3 in the last 72 hours.  Invalid input(s): PCO2, PO2  Studies/Results: No results found.  Anti-infectives: Anti-infectives    Start     Dose/Rate Route Frequency Ordered Stop   07/22/17 0708  ceFAZolin (ANCEF) IVPB 2g/100 mL premix     2 g 200 mL/hr over 30 Minutes Intravenous On call to O.R. 07/22/17 0092 07/22/17 0856      Assessment/Plan: POD 1 open repair ventral hernia with mesh  1. Will add toradol and robaxin today and increase pain control 2. pulm toilet, ics 3. Continue clear liquids today, do not advance 4. Check bmet due to toradol, cr normal preop 5. lovenox scds   [facility] 07/23/2017

## 2017-07-24 LAB — CBC
HEMATOCRIT: 42.4 % (ref 39.0–52.0)
HEMOGLOBIN: 13.9 g/dL (ref 13.0–17.0)
MCH: 30.2 pg (ref 26.0–34.0)
MCHC: 32.8 g/dL (ref 30.0–36.0)
MCV: 92.2 fL (ref 78.0–100.0)
Platelets: 196 10*3/uL (ref 150–400)
RBC: 4.6 MIL/uL (ref 4.22–5.81)
RDW: 13.7 % (ref 11.5–15.5)
WBC: 11.9 10*3/uL — ABNORMAL HIGH (ref 4.0–10.5)

## 2017-07-24 LAB — BASIC METABOLIC PANEL
Anion gap: 5 (ref 5–15)
BUN: 12 mg/dL (ref 6–20)
CO2: 27 mmol/L (ref 22–32)
Calcium: 8.5 mg/dL — ABNORMAL LOW (ref 8.9–10.3)
Chloride: 101 mmol/L (ref 101–111)
Creatinine, Ser: 1.15 mg/dL (ref 0.61–1.24)
GFR calc Af Amer: >60 mL/min (ref >60)
GFR calc non Af Amer: >60 mL/min (ref >60)
Glucose, Bld: 112 mg/dL — ABNORMAL HIGH (ref 65–99)
Potassium: 3.9 mmol/L (ref 3.5–5.1)
Sodium: 133 mmol/L — ABNORMAL LOW (ref 135–145)

## 2017-07-24 MED ORDER — DIPHENHYDRAMINE HCL 25 MG PO CAPS: 50.0000 mg | ORAL_CAPSULE | Freq: Four times a day (QID) | ORAL | Status: DC | PRN

## 2017-07-24 NOTE — Progress Notes (Signed)
2 Days Post-Op   Subjective/Chief Complaint: Back itching    Objective: Vital signs in last 24 hours: Temp:  [96.7 F (35.9 C)-98 F (36.7 C)] 98 F (36.7 C) (09/14 0519) Pulse Rate:  [50-65] 57 (09/14 0519) Resp:  [17-20] 18 (09/14 0519) BP: (112-133)/(86-97) 124/86 (09/14 0519) SpO2:  [94 %-98 %] 94 % (09/14 0519) Last BM Date: 07/22/17  Intake/Output from previous day: 09/13 0701 - 09/14 0700 In: 1588.8 [P.O.:240; I.V.:1293.8; IV Piggyback:55] Out: 7703 [Urine:1140; Drains:265] Intake/Output this shift: No intake/output data recorded.  Incision/Wound:incision CDI   Minimal drainage sore   Lab Results:   Recent Labs  07/24/17 0439  WBC 11.9*  HGB 13.9  HCT 42.4  PLT 196   BMET  Recent Labs  07/23/17 1441 07/24/17 0439  NA 136 133*  K 3.4* 3.9  CL 101 101  CO2 28 27  GLUCOSE 125* 112*  BUN 14 12  CREATININE 1.25* 1.15  CALCIUM 8.6* 8.5*   PT/INR No results for input(s): LABPROT, INR in the last 72 hours. ABG No results for input(s): PHART, HCO3 in the last 72 hours.  Invalid input(s): PCO2, PO2  Studies/Results: No results found.  Anti-infectives: Anti-infectives    Start     Dose/Rate Route Frequency Ordered Stop   07/22/17 0708  ceFAZolin (ANCEF) IVPB 2g/100 mL premix     2 g 200 mL/hr over 30 Minutes Intravenous On call to O.R. 07/22/17 0708 07/22/17 0856      Assessment/Plan: s/p Procedure(s) with comments: OPEN VENTRAL HERNIA REPAIR WITH MESH ERAS PATHWAY (N/A) - BILATERAL TAP BLOCK INSERTION OF MESH (N/A) - BILATERAL TAP BLOCK OOB AMBULATE  ADV DIET SL IV   LOS: 2 days    Randall Homewood A. 07/24/2017

## 2017-07-25 MED ORDER — SENNA 8.6 MG PO TABS
2.0000 | ORAL_TABLET | Freq: Two times a day (BID) | ORAL | Status: DC
  Administered 2017-07-25 – 2017-07-27 (×5): 17.2 mg via ORAL
  Filled 2017-07-25 (×4): qty 2

## 2017-07-25 MED ORDER — DIPHENHYDRAMINE HCL 50 MG/ML IJ SOLN: 50.0000 mg | Freq: Three times a day (TID) | INTRAMUSCULAR | Status: DC | PRN

## 2017-07-25 NOTE — Progress Notes (Signed)
3 Days Post-Op  Subjective: Stable and alert .Passing flatus but no stool. Complains of itching of back and arms Doing better with diet Creatinine 1.15, better.  Potassium 3.9.  WBC 11,900.  Hemoglobin 13.9.  Glucose 112.  Objective: Vital signs in last 24 hours: Temp:  [97.9 F (36.6 C)-98.2 F (36.8 C)] 97.9 F (36.6 C) (09/15 0552) Pulse Rate:  [62-100] 62 (09/15 0552) Resp:  [18] 18 (09/15 0552) BP: (104-110)/(72-80) 104/80 (09/15 0552) SpO2:  [91 %-96 %] 96 % (09/15 0552) Last BM Date: 07/22/17  Intake/Output from previous day: 09/14 0701 - 09/15 0700 In: 768.8 [P.O.:180; I.V.:588.8] Out: 625 [Urine:450; Drains:175] Intake/Output this shift: No intake/output data recorded.    EXAM: General appearance: alert.  Pleasant.  No distress.  Mental status normal. Resp: clear to auscultation bilaterally GI: soft.  Wound clean.A little distended.  Bowel sounds present.  JP drainage thin, serosanguineous.175 mL per 24 hours.  Lab Results:  No results found for this or any previous visit (from the past 24 hour(s)).   Studies/Results: No results found.  [name] acetaminophen  1,000 mg Oral Q6H  . enoxaparin (LOVENOX) injection  40 mg Subcutaneous Q24H  . senna  2 tablet Oral BID     Assessment/Plan: s/p Procedure(s): OPEN VENTRAL HERNIA REPAIR WITH MESH ERAS PATHWAY INSERTION OF MESH   Stable. Still distended.  Await ileus resolution Diet as tolerated Add Senokot stimulate GI function Check labs tomorrow, monitor WBC and creatinine.  @PROBHOSP @  LOS: 3 days    Bergen Magner M 07/25/2017  . .prob

## 2017-07-25 NOTE — Progress Notes (Signed)
Pt ambulating independently on unit. Walked multiple laps yesterday and today with no concerns reported

## 2017-07-26 LAB — CBC
HEMATOCRIT: 42.3 % (ref 39.0–52.0)
Hemoglobin: 13.5 g/dL (ref 13.0–17.0)
MCH: 29.1 pg (ref 26.0–34.0)
MCHC: 31.9 g/dL (ref 30.0–36.0)
MCV: 91.2 fL (ref 78.0–100.0)
Platelets: 200 10*3/uL (ref 150–400)
RBC: 4.64 MIL/uL (ref 4.22–5.81)
RDW: 13.8 % (ref 11.5–15.5)
WBC: 7.9 10*3/uL (ref 4.0–10.5)

## 2017-07-26 LAB — BASIC METABOLIC PANEL
Anion gap: 5 (ref 5–15)
BUN: 10 mg/dL (ref 6–20)
CALCIUM: 8.5 mg/dL — AB (ref 8.9–10.3)
CO2: 28 mmol/L (ref 22–32)
CREATININE: 1.03 mg/dL (ref 0.61–1.24)
Chloride: 105 mmol/L (ref 101–111)
GFR calc Af Amer: >60 mL/min (ref >60)
GFR calc non Af Amer: >60 mL/min (ref >60)
Glucose, Bld: 102 mg/dL — ABNORMAL HIGH (ref 65–99)
Potassium: 3.3 mmol/L — ABNORMAL LOW (ref 3.5–5.1)
Sodium: 138 mmol/L (ref 135–145)

## 2017-07-26 MED ORDER — POTASSIUM CHLORIDE 10 MEQ/100ML IV SOLN
10.0000 meq | INTRAVENOUS | Status: AC
  Administered 2017-07-26 (×6): 10 meq via INTRAVENOUS
  Filled 2017-07-26: qty 100

## 2017-07-26 NOTE — Progress Notes (Signed)
4 Days Post-Op  Subjective: Pleasant.  Stable.  Alert.Ambulating a lot Passing lots of flatus but still no stool and not very hungry. Itching seems to have resolved Labs reveal potassium 3.3.  WBC 7900.   Objective: Vital signs in last 24 hours: Temp:  [98 F (36.7 C)-98.9 F (37.2 C)] 98 F (36.7 C) (09/16 0535) Pulse Rate:  [53-100] 100 (09/16 0535) Resp:  [16-18] 18 (09/16 0535) BP: (113-129)/(82-93) 113/89 (09/16 0535) SpO2:  [95 %-97 %] 97 % (09/16 0535) Last BM Date: 07/22/17  Intake/Output from previous day: 09/15 0701 - 09/16 0700 In: 420 [P.O.:420] Out: 410 [Urine:200; Drains:210] Intake/Output this shift: Total I/O In: 420 [P.O.:420] Out: 120 [Drains:120]    EXAM: General appearance: alert.  Pleasant.  No distress.  Mental status normal. Resp: clear to auscultation bilaterally GI: soft.  Wound clean.A little distended.  Bowel sounds present.  JP drainage thin, serosanguineous. 210 cc.   Lab Results:  Results for orders placed or performed during the hospital encounter of 07/22/17 (from the past 24 hour(s))  CBC     Status: None   Collection Time: 07/26/17  3:51 AM  Result Value Ref Range   WBC 7.9 4.0 - 10.5 K/uL   RBC 4.64 4.22 - 5.81 MIL/uL   Hemoglobin 13.5 13.0 - 17.0 g/dL   HCT 42.3 39.0 - 52.0 %   MCV 91.2 78.0 - 100.0 fL   MCH 29.1 26.0 - 34.0 pg   MCHC 31.9 30.0 - 36.0 g/dL   RDW 13.8 11.5 - 15.5 %   Platelets 200 150 - 400 K/uL  Basic metabolic panel     Status: Abnormal   Collection Time: 07/26/17  3:51 AM  Result Value Ref Range   Sodium 138 135 - 145 mmol/L   Potassium 3.3 (L) 3.5 - 5.1 mmol/L   Chloride 105 101 - 111 mmol/L   CO2 28 22 - 32 mmol/L   Glucose, Bld 102 (H) 65 - 99 mg/dL   BUN 10 6 - 20 mg/dL   Creatinine, Ser 1.03 0.61 - 1.24 mg/dL   Calcium 8.5 (L) 8.9 - 10.3 mg/dL   GFR calc non Af Amer >60 >60 mL/min   GFR calc Af Amer >60 >60 mL/min   Anion gap 5 5 - 15     Studies/Results: No results found.  [name]  acetaminophen  1,000 mg Oral Q6H  . enoxaparin (LOVENOX) injection  40 mg Subcutaneous Q24H  . senna  2 tablet Oral BID     Assessment/Plan: s/p Procedure(s): OPEN VENTRAL HERNIA REPAIR WITH MESH ERAS PATHWAY INSERTION OF MESH  Stable. Still a little distended.  Await ileus resolution Diet as tolerated Supplement potassium Add Senokot stimulate GI function   @PROBHOSP @  LOS: 4 days    Vaughn Frieze M 07/26/2017  . .prob

## 2017-07-26 NOTE — Plan of Care (Signed)
Problem: Pain Management: Goal: General experience of comfort will improve Outcome: Progressing Patient pain score 6 out of 10

## 2017-07-27 LAB — BASIC METABOLIC PANEL
Anion gap: 5 (ref 5–15)
BUN: 11 mg/dL (ref 6–20)
CALCIUM: 8.8 mg/dL — AB (ref 8.9–10.3)
CHLORIDE: 106 mmol/L (ref 101–111)
CO2: 27 mmol/L (ref 22–32)
CREATININE: 1.11 mg/dL (ref 0.61–1.24)
GFR calc non Af Amer: >60 mL/min (ref >60)
Glucose, Bld: 107 mg/dL — ABNORMAL HIGH (ref 65–99)
Potassium: 4.1 mmol/L (ref 3.5–5.1)
SODIUM: 138 mmol/L (ref 135–145)

## 2017-07-27 MED ORDER — OXYCODONE HCL 5 MG PO TABS: 5.0000 mg | ORAL_TABLET | Freq: Four times a day (QID) | ORAL | 0 refills | Status: DC | PRN

## 2017-07-27 MED ORDER — METHOCARBAMOL 750 MG PO TABS: 500.0000 mg | ORAL_TABLET | Freq: Three times a day (TID) | ORAL | 0 refills | Status: DC | PRN

## 2017-07-27 MED ORDER — METHOCARBAMOL 1000 MG/10ML IJ SOLN: 500.0000 mg | Freq: Three times a day (TID) | INTRAVENOUS | 0 refills | Status: DC | PRN

## 2017-07-27 MED ORDER — PNEUMOCOCCAL 13-VAL CONJ VACC IM SUSP
0.5000 mL | Freq: Once | INTRAMUSCULAR | Status: AC
  Administered 2017-07-27: 0.5 mL via INTRAMUSCULAR
  Filled 2017-07-27: qty 0.5

## 2017-07-27 NOTE — Progress Notes (Signed)
5 Days Post-Op   Subjective/Chief Complaint: Having flatus, having bms, walking plenty, tolerating diet, sore but pain controlled   Objective: Vital signs in last 24 hours: Temp:  [97.9 F (36.6 C)-98.3 F (36.8 C)] 98 F (36.7 C) (09/17 0458) Pulse Rate:  [56-83] 56 (09/17 0458) Resp:  [17-18] 18 (09/17 0458) BP: (104-120)/(73-85) 104/73 (09/17 0458) SpO2:  [96 %-97 %] 97 % (09/17 0458) Last BM Date: 07/26/17  Intake/Output from previous day: 09/16 0701 - 09/17 0700 In: 440 [P.O.:440] Out: 105 [Drains:105] Intake/Output this shift: No intake/output data recorded.  GI: drain serous, dressing removed today incision clean soft nondistended  Lab Results:   Recent Labs  07/26/17 0351  WBC 7.9  HGB 13.5  HCT 42.3  PLT 200   BMET  Recent Labs  07/26/17 0351 07/27/17 0324  NA 138 138  K 3.3* 4.1  CL 105 106  CO2 28 27  GLUCOSE 102* 107*  BUN 10 11  CREATININE 1.03 1.11  CALCIUM 8.5* 8.8*    Anti-infectives: Anti-infectives    Start     Dose/Rate Route Frequency Ordered Stop   07/22/17 0708  ceFAZolin (ANCEF) IVPB 2g/100 mL premix     2 g 200 mL/hr over 30 Minutes Intravenous On call to O.R. 07/22/17 0708 07/22/17 0856      Assessment/Plan: POD 5 open vh repair  Po pain meds Dc home today Will call to remove drain hopefully later this week  [facility] 07/27/2017

## 2017-07-27 NOTE — Discharge Instructions (Signed)
[facility]      [facility] [phone]  OPEN ABDOMINAL SURGERY: POST OP INSTRUCTIONS  Always review your discharge instruction sheet given to you by the facility where your surgery was performed.  IF YOU HAVE DISABILITY OR FAMILY LEAVE FORMS, YOU MUST BRING THEM TO THE OFFICE FOR PROCESSING.  PLEASE DO NOT GIVE THEM TO YOUR DOCTOR.  1. A prescription for pain medication may be given to you upon discharge.  Take your pain medication as prescribed, if needed.  If narcotic pain medicine is not needed, then you may take acetaminophen (Tylenol) or ibuprofen (Advil) as needed. 2. Take your usually prescribed medications unless otherwise directed. 3. If you need a refill on your pain medication, please contact your pharmacy. They will contact our office to request authorization.  Prescriptions will not be filled after 5pm or on week-ends. 4. You should follow a light diet the first few days after arrival home, such as soup and crackers, pudding, etc.unless your doctor has advised otherwise. A high-fiber, low fat diet can be resumed as tolerated.   Be sure to include lots of fluids daily. Most patients will experience some swelling and bruising on the chest and neck area.  Ice packs will help.  Swelling and bruising can take several days to resolve 5. Most patients will experience some swelling and bruising in the area of the incision. Ice pack will help. Swelling and bruising can take several days to resolve..  6. It is common to experience some constipation if taking pain medication after surgery.  Increasing fluid intake and taking a stool softener will usually help or prevent this problem from occurring.  A mild laxative (Milk of Magnesia or Miralax) should be taken according to package directions if there are no bowel movements after 48 hours. 7.  You may have steri-strips (small skin tapes) in place directly over the incision.  These strips should be left on the skin for 7-10 days.  If your  surgeon used skin glue on the incision, you may shower in 24 hours.  The glue will flake off over the next 2-3 weeks.  Any sutures or staples will be removed at the office during your follow-up visit. You may find that a light gauze bandage over your incision may keep your staples from being rubbed or pulled. You may shower and replace the bandage daily. 8. ACTIVITIES:  You may resume regular (light) daily activities beginning the next day--such as daily self-care, walking, climbing stairs--gradually increasing activities as tolerated.  You may have sexual intercourse when it is comfortable.  Refrain from any heavy lifting or straining until approved by your doctor. a. You may drive when you no longer are taking prescription pain medication, you can comfortably wear a seatbelt, and you can safely maneuver your car and apply brakes b. Return to Work: ___________________________________ 9. You should see your doctor in the office for a follow-up appointment approximately two weeks after your surgery.  Make sure that you call for this appointment within a day or two after you arrive home to insure a convenient appointment time. OTHER INSTRUCTIONS:  _____________________________________________________________ _____________________________________________________________  WHEN TO CALL YOUR DOCTOR: 1. Fever over 101.0 2. Inability to urinate 3. Nausea and/or vomiting 4. Extreme swelling or bruising 5. Continued bleeding from incision. 6. Increased pain, redness, or drainage from the incision. 7. Difficulty swallowing or breathing 8. Muscle cramping or spasms. 9. Numbness or tingling in hands or feet or around lips.  The clinic staff is available to   answer your questions during regular business hours.  Please don't hesitate to call and ask to speak to one of the nurses if you have concerns.  For further questions, please visit [email or web address]   

## 2017-07-27 NOTE — Progress Notes (Signed)
Patient ID: Randall Best, male   DOB: Jul 01, 1951, 66 y.o.   MRN: [id number] [facility] reviewed day of discharge

## 2017-07-27 NOTE — Discharge Summary (Signed)
Physician Discharge Summary  Patient ID: Randall Best MRN: [id number] DOB/AGE: [birth year] 67 y.o.  Admit date: 07/22/2017 Discharge date: 07/27/2017  Admission Diagnoses: Ventral hernia  Discharge Diagnoses:  Active Problems:   Ventral hernia without obstruction or gangrene   Discharged Condition: good  Hospital Course: 24 yom who underwent open retrorectus repair of ventral hernia with mesh.  He remained for pain control and ileus. This has resolved and he will be discharged  Consults: None  Significant Diagnostic Studies: none  Treatments: surgery: open ventral hernia repair with mesh    Disposition:    Allergies as of 07/27/2017      Reactions   Ezetimibe Other (See Comments)   Broke out in sores   Omeprazole Nausea And Vomiting   Statins Other (See Comments)   Muscle pains      Medication List    TAKE these medications   diphenhydrAMINE 25 MG tablet Commonly known as:  BENADRYL Take 25 mg by mouth daily as needed (for fluid behind eardrum).   Fish Oil 1200 MG Caps Take 1,200 mg by mouth daily.   fluticasone 50 MCG/ACT nasal spray Commonly known as:  FLONASE Place 1 spray into both nostrils daily as needed (for fluid behing eardrum).   methocarbamol 500 mg in dextrose 5 % 50 mL Inject 500 mg into the vein every 8 (eight) hours as needed.   oxyCODONE 5 MG immediate release tablet Commonly known as:  Oxy IR/ROXICODONE Take 1 tablet (5 mg total) by mouth every 6 (six) hours as needed for moderate pain.   Vitamin D-3 5000 units Tabs Take 5,000 Units by mouth daily.            Discharge Care Instructions        Start     Ordered   07/27/17 0000  methocarbamol 500 mg in dextrose 5 % 50 mL  Every 8 hours PRN     07/27/17 0725   07/27/17 0000  oxyCODONE (OXY IR/ROXICODONE) 5 MG immediate release tablet  Every 6 hours PRN     07/27/17 0725     Follow-up Information    [name], MD Follow up in 1 week(s).   Specialty:  General  Surgery Why:  we will call you Contact information: [address] [place] [address] [phone]           Signed: [name] 07/27/2017, 7:30 AM

## 2017-08-04 ENCOUNTER — Inpatient Hospital Stay (HOSPITAL_COMMUNITY)
Admission: EM | Admit: 2017-08-04 | Discharge: 2017-08-06 | DRG: 392 | Disposition: A | Discharge status: Home / Self Care | Payer: BLUE CROSS/BLUE SHIELD | Attending: General Surgery | Admitting: General Surgery

## 2017-08-04 ENCOUNTER — Encounter (HOSPITAL_COMMUNITY): Payer: Self-pay | Admitting: Emergency Medicine

## 2017-08-04 ENCOUNTER — Emergency Department (HOSPITAL_COMMUNITY): Payer: BLUE CROSS/BLUE SHIELD

## 2017-08-04 ENCOUNTER — Observation Stay (HOSPITAL_COMMUNITY): Payer: BLUE CROSS/BLUE SHIELD

## 2017-08-04 DIAGNOSIS — R109 Unspecified abdominal pain: Secondary | ICD-10-CM

## 2017-08-04 DIAGNOSIS — K567 Ileus, unspecified: Secondary | ICD-10-CM

## 2017-08-04 DIAGNOSIS — R110 Nausea: Secondary | ICD-10-CM | POA: Diagnosis not present

## 2017-08-04 DIAGNOSIS — R066 Hiccough: Secondary | ICD-10-CM | POA: Diagnosis not present

## 2017-08-04 DIAGNOSIS — Z888 Allergy status to other drugs, medicaments and biological substances status: Secondary | ICD-10-CM | POA: Diagnosis not present

## 2017-08-04 DIAGNOSIS — Z79899 Other long term (current) drug therapy: Secondary | ICD-10-CM | POA: Diagnosis not present

## 2017-08-04 DIAGNOSIS — Z9889 Other specified postprocedural states: Secondary | ICD-10-CM

## 2017-08-04 DIAGNOSIS — R51 Headache: Secondary | ICD-10-CM | POA: Diagnosis present

## 2017-08-04 DIAGNOSIS — R112 Nausea with vomiting, unspecified: Secondary | ICD-10-CM | POA: Diagnosis present

## 2017-08-04 DIAGNOSIS — K760 Fatty (change of) liver, not elsewhere classified: Secondary | ICD-10-CM | POA: Diagnosis not present

## 2017-08-04 DIAGNOSIS — R197 Diarrhea, unspecified: Principal | ICD-10-CM | POA: Diagnosis present

## 2017-08-04 DIAGNOSIS — R1110 Vomiting, unspecified: Secondary | ICD-10-CM

## 2017-08-04 DIAGNOSIS — K9189 Other postprocedural complications and disorders of digestive system: Secondary | ICD-10-CM

## 2017-08-04 LAB — COMPREHENSIVE METABOLIC PANEL
ALK PHOS: 61 U/L (ref 38–126)
ALT: 28 U/L (ref 17–63)
ANION GAP: 9 (ref 5–15)
AST: 18 U/L (ref 15–41)
Albumin: 3.8 g/dL (ref 3.5–5.0)
BILIRUBIN TOTAL: 0.9 mg/dL (ref 0.3–1.2)
BUN: 22 mg/dL — ABNORMAL HIGH (ref 6–20)
CALCIUM: 10.1 mg/dL (ref 8.9–10.3)
CO2: 27 mmol/L (ref 22–32)
Chloride: 99 mmol/L — ABNORMAL LOW (ref 101–111)
Creatinine, Ser: 1.16 mg/dL (ref 0.61–1.24)
Glucose, Bld: 120 mg/dL — ABNORMAL HIGH (ref 65–99)
Potassium: 4.5 mmol/L (ref 3.5–5.1)
SODIUM: 135 mmol/L (ref 135–145)
TOTAL PROTEIN: 6.9 g/dL (ref 6.5–8.1)

## 2017-08-04 LAB — CBC
HCT: 42.9 % (ref 39.0–52.0)
HEMATOCRIT: 41.3 % (ref 39.0–52.0)
HEMOGLOBIN: 13.8 g/dL (ref 13.0–17.0)
HEMOGLOBIN: 13.6 g/dL (ref 13.0–17.0)
MCH: 28.5 pg (ref 26.0–34.0)
MCH: 29.2 pg (ref 26.0–34.0)
MCHC: 32.2 g/dL (ref 30.0–36.0)
MCHC: 32.9 g/dL (ref 30.0–36.0)
MCV: 88.6 fL (ref 78.0–100.0)
MCV: 88.8 fL (ref 78.0–100.0)
Platelets: 288 10*3/uL (ref 150–400)
Platelets: 275 10*3/uL (ref 150–400)
RBC: 4.84 MIL/uL (ref 4.22–5.81)
RBC: 4.65 MIL/uL (ref 4.22–5.81)
RDW: 13.3 % (ref 11.5–15.5)
RDW: 13.1 % (ref 11.5–15.5)
WBC: 16.6 10*3/uL — ABNORMAL HIGH (ref 4.0–10.5)
WBC: 15.7 10*3/uL — ABNORMAL HIGH (ref 4.0–10.5)

## 2017-08-04 LAB — CREATININE, SERUM
Creatinine, Ser: 1.02 mg/dL (ref 0.61–1.24)
GFR calc Af Amer: >60 mL/min (ref >60)

## 2017-08-04 LAB — LIPASE, BLOOD: Lipase: 25 U/L (ref 11–51)

## 2017-08-04 MED ORDER — DIPHENHYDRAMINE HCL 50 MG/ML IJ SOLN: 12.5000 mg | Freq: Four times a day (QID) | INTRAMUSCULAR | Status: DC | PRN

## 2017-08-04 MED ORDER — ENOXAPARIN SODIUM 40 MG/0.4ML ~~LOC~~ SOLN
40.0000 mg | SUBCUTANEOUS | Status: DC
  Administered 2017-08-04 – 2017-08-05 (×2): 40 mg via SUBCUTANEOUS
  Filled 2017-08-04 (×2): qty 0.4

## 2017-08-04 MED ORDER — SODIUM CHLORIDE 0.9 % IV SOLN
INTRAVENOUS | Status: DC
  Administered 2017-08-04: 16:00:00 via INTRAVENOUS
  Administered 2017-08-05: 1000 mL via INTRAVENOUS

## 2017-08-04 MED ORDER — IOPAMIDOL (ISOVUE-300) INJECTION 61%
INTRAVENOUS | Status: AC
  Administered 2017-08-04: 100 mL
  Filled 2017-08-04: qty 30

## 2017-08-04 MED ORDER — SODIUM CHLORIDE 0.9 % IV BOLUS (SEPSIS)
500.0000 mL | Freq: Once | INTRAVENOUS | Status: AC
  Administered 2017-08-04: 500 mL via INTRAVENOUS

## 2017-08-04 MED ORDER — ONDANSETRON 4 MG PO TBDP: 4.0000 mg | ORAL_TABLET | Freq: Four times a day (QID) | ORAL | Status: DC | PRN

## 2017-08-04 MED ORDER — IOPAMIDOL (ISOVUE-300) INJECTION 61%
INTRAVENOUS | Status: AC
  Filled 2017-08-04: qty 100

## 2017-08-04 MED ORDER — HEPARIN SODIUM (PORCINE) 5000 UNIT/ML IJ SOLN: 5000.0000 [IU] | Freq: Three times a day (TID) | INTRAMUSCULAR | Status: DC

## 2017-08-04 MED ORDER — ONDANSETRON HCL 4 MG/2ML IJ SOLN
INTRAMUSCULAR | Status: AC
  Administered 2017-08-04: 4 mg via INTRAVENOUS
  Filled 2017-08-04: qty 2

## 2017-08-04 MED ORDER — DIPHENHYDRAMINE HCL 12.5 MG/5ML PO ELIX: 12.5000 mg | ORAL_SOLUTION | Freq: Four times a day (QID) | ORAL | Status: DC | PRN

## 2017-08-04 MED ORDER — ONDANSETRON HCL 4 MG/2ML IJ SOLN
4.0000 mg | Freq: Four times a day (QID) | INTRAMUSCULAR | Status: DC | PRN
  Administered 2017-08-04: 4 mg via INTRAVENOUS

## 2017-08-04 MED ORDER — MORPHINE SULFATE (PF) 4 MG/ML IV SOLN
1.0000 mg | INTRAVENOUS | Status: DC | PRN
  Administered 2017-08-04 – 2017-08-05 (×4): 3 mg via INTRAVENOUS
  Filled 2017-08-04 (×4): qty 1

## 2017-08-04 NOTE — Progress Notes (Signed)
Patient trasfered from ED to [phone] via stretcher; alert and oriented x 4; complaints of abdominal pain ; IV in RAC running fluids@100cc /hr; Orient patient to room and unit; gave patient care guide; instructed how to use the call bell and  fall risk precautions. Will continue to monitor the patient.

## 2017-08-04 NOTE — ED Notes (Signed)
Patient transported to X-ray 

## 2017-08-04 NOTE — Progress Notes (Signed)
Patient ID: Randall Best, male   DOB: [birth year], 66 y.o.   MRN: [id number] Was doing well after mesh retrorectus repair of ventral hernia until Sunday and started with diarrhea that continued until Monday night when he began vomiting. Also with hiccups.  Afebrile, abdomen is soft and not tender now, drain is serous and put out 200 yesterday, incision clean.  Xray nonspecific, wbc up.  He does not appear ill.  Will get ct tonight, check c diff

## 2017-08-04 NOTE — Progress Notes (Signed)
Patient ID: Randall Best, male   DOB: 06/28/1951, 66 y.o.   MRN: [id number] Ct read and reviewed, appears to have ileus and not an issue with hernia repair although bowel is near repair where ileus appears to be.  He is still having some nausea and diarrhea. Will follow up c diff and continue iv fluids. Discussed with patient

## 2017-08-04 NOTE — ED Provider Notes (Signed)
[facility] DEPT Provider Note   CSN: [id number] Arrival date & time: 08/04/17  2458  History   Chief Complaint Chief Complaint  Patient presents with  . Abdominal Pain    HPI Randall Best is a 66 y.o. male.  HPI    Patient comes to the ER for evaluation of nausea, vomiting and diarrhea. He had an open retrorectus repair of his ventral hernia with Mesh on 07/22/2017. He did have complications of ileus and hypokalemia post operatively. He eventually went on a few days later 07/27/2017. He called the surgical office this morning because he was having N/V/D and was therefore recommended to go to the ER.    Past Medical History:  Diagnosis Date  . Headache    hx  . Hyperlipidemia   . Ventral hernia     Patient Active Problem List   Diagnosis Date Noted  . Nausea vomiting and diarrhea 08/04/2017  . Ventral hernia without obstruction or gangrene 07/22/2017    Past Surgical History:  Procedure Laterality Date  . INSERTION OF MESH N/A 07/22/2017   Procedure: INSERTION OF MESH;  Surgeon: [name], MD;  Location: [facility];  Service: General;  Laterality: N/A;  BILATERAL TAP BLOCK  . TONSILLECTOMY    . VENTRAL HERNIA REPAIR N/A 07/22/2017   Procedure: OPEN VENTRAL HERNIA REPAIR WITH MESH ERAS PATHWAY;  Surgeon: [name], MD;  Location: [facility];  Service: General;  Laterality: N/A;  BILATERAL TAP BLOCK       Home Medications    Prior to Admission medications   Medication Sig Start Date End Date Taking? Authorizing Provider  Cholecalciferol (VITAMIN D-3) 5000 units TABS Take 5,000 Units by mouth daily.    [provider]  diphenhydrAMINE (BENADRYL) 25 MG tablet Take 25 mg by mouth daily as needed (for fluid behind eardrum).    [provider]  fluticasone (FLONASE) 50 MCG/ACT nasal spray Place 1 spray into both nostrils daily as needed (for fluid behing eardrum).    [provider]  methocarbamol (ROBAXIN) 750 MG tablet Take 0.5  tablets (375 mg total) by mouth every 8 (eight) hours as needed (use for muscle cramps/pain). 07/27/17   [name], MD  methocarbamol (ROBAXIN) 750 MG tablet Take 0.5 tablets (375 mg total) by mouth every 8 (eight) hours as needed (use for muscle cramps/pain). 07/27/17   [name], MD  methocarbamol 500 mg in dextrose 5 % 50 mL Inject 500 mg into the vein every 8 (eight) hours as needed. 07/27/17   [name], MD  Omega-3 Fatty Acids (FISH OIL) 1200 MG CAPS Take 1,200 mg by mouth daily.    [provider]  oxyCODONE (OXY IR/ROXICODONE) 5 MG immediate release tablet Take 1 tablet (5 mg total) by mouth every 6 (six) hours as needed for moderate pain. 07/27/17   [name], MD    Family History No family history on file.  Social History Social History  Substance Use Topics  . Smoking status: Never Smoker  . Smokeless tobacco: Never Used  . Alcohol use Yes     Comment: occ     Allergies   Ezetimibe; Omeprazole; and Statins   Review of Systems Review of Systems   Physical Exam Updated Vital Signs BP 121/82   Pulse 95   Temp 98.7 F (37.1 C) (Oral)   Resp 17   SpO2 99%   Physical Exam  Constitutional: He appears well-developed and well-nourished. No distress.  HENT:  Head: Normocephalic and atraumatic.  Right Ear:  Tympanic membrane and ear canal normal.  Left Ear: Tympanic membrane and ear canal normal.  Nose: Nose normal.  Mouth/Throat: Uvula is midline, oropharynx is clear and moist and mucous membranes are normal.  Eyes: Pupils are equal, round, and reactive to light.  Neck: Normal range of motion. Neck supple.  Cardiovascular: Normal rate and regular rhythm.   Pulmonary/Chest: Effort normal.  Abdominal: Soft.  Wound drain with a large blood clot, otherwise serosanguinous fluid.  Surgical staples in place, clean and dry  Tenderness to the abdomen, general.  Musculoskeletal:  No LE swelling  Neurological: He is alert.    Acting at baseline  Skin: Skin is warm and dry. No rash noted.  Nursing note and vitals reviewed.    ED Treatments / Results  Labs (all labs ordered are listed, but only abnormal results are displayed) Labs Reviewed  COMPREHENSIVE METABOLIC PANEL - Abnormal; Notable for the following:       Result Value   Chloride 99 (*)    Glucose, Bld 120 (*)    BUN 22 (*)    All other components within normal limits  CBC - Abnormal; Notable for the following:    WBC 16.6 (*)    All other components within normal limits  C DIFFICILE QUICK SCREEN W PCR REFLEX  LIPASE, BLOOD  URINALYSIS, ROUTINE W REFLEX MICROSCOPIC  CBC  CREATININE, SERUM  URINALYSIS, ROUTINE W REFLEX MICROSCOPIC    EKG  EKG Interpretation None       Radiology Dg Abd 2 Views  Result Date: 08/04/2017 CLINICAL DATA:  Abdominal pain, nausea and vomiting, recent surgery for hernia repair on 07/22/2017 EXAM: ABDOMEN - 2 VIEW COMPARISON:  None. FINDINGS: I drainage tube coils over the low mid abdomen with the tip extending the right abdomen. No definite bowel obstruction is seen. However with slight gaseous distention of small bowel, and very little large bowel gas, a developing partial small bowel obstruction would be a consideration. No bony abnormality is seen. IMPRESSION: Slightly prominent small bowel loops. Cannot exclude developing partial SBO. Recommend CT the abdomen pelvis or followup plain films with supine and erect images to assess further . Electronically Signed   By: Ivar Drape M.D.   On: 08/04/2017 14:35    Procedures Procedures (including critical care time)  Medications Ordered in ED Medications  heparin injection 5,000 Units (not administered)  0.9 %  sodium chloride infusion (not administered)  morphine 4 MG/ML injection 1-3 mg (not administered)  diphenhydrAMINE (BENADRYL) 12.5 MG/5ML elixir 12.5 mg (not administered)    Or  diphenhydrAMINE (BENADRYL) injection 12.5 mg (not administered)   ondansetron (ZOFRAN-ODT) disintegrating tablet 4 mg (not administered)    Or  ondansetron (ZOFRAN) injection 4 mg (not administered)     Initial Impression / Assessment and Plan / ED Course  I have reviewed the triage vital signs and the nursing notes.  Pertinent labs & imaging results that were available during my care of the patient were reviewed by me and considered in my medical decision making (see chart for details).     I spoke with Modena Jansky, PA-C with Gen Surg, he has come to evaluate the patient. Overall his labs are okay. The abdominal xrays report a concern of possible early SBO. CT AP ordered by surgery.    3:34 pm Patient handed off to Dr. Maryan Rued, awaiting Surgeries recommendation on disposition, admission, obs, vs dc home.  Final Clinical Impressions(s) / ED Diagnoses   Final diagnoses:  Nausea  Vomiting  Abdominal pain    New Prescriptions New Prescriptions   No medications on file     Delos Haring, Hershal Coria 08/04/17 [place], Grayce Sessions, MD 08/06/17 [phone]

## 2017-08-04 NOTE — ED Triage Notes (Signed)
States has had n/v/d since Sunday cramps , had surgery hernia surgery on hernia on 9/12 has jackson pratt drain

## 2017-08-04 NOTE — Consult Note (Signed)
Reason for Consult:  Nausea vomiting and diarrhea Referring Physician:  CC:  Nausea, vomiting and diarrhea  Randall Best is an 66 y.o. male.  HPI: Pt is a 66 y/o who underwent Open retrorectus repair of his ventral hernia with Mesh on 07/22/17.  Post op he had some ileus and hypokalemia.  This resolved and he went home on 07/27/17.  He called the office this AM with complaints of nausea, vomiting and diarrhea.  He was sent to the ED for evaluation.  Work up in the ED shows: he is afebrile, BP low normal 112/50, HR 123.  K+ 4.5, glucose up some at 120, BUN is up to 22, otherwise normal CMP.  WBC is 16.6, H/H 13.8/42.9.  2 view abdominal films shows:  Slightly prominent small bowel loops. Cannot exclude developing partial SBO. Recommend CT the abdomen pelvis, or abdominal films.   Past Medical History:  Diagnosis Date  . Headache    hx  . Hyperlipidemia   . Ventral hernia     Past Surgical History:  Procedure Laterality Date  . INSERTION OF MESH N/A 07/22/2017   Procedure: INSERTION OF MESH;  Surgeon: [name], MD;  Location: [facility];  Service: General;  Laterality: N/A;  BILATERAL TAP BLOCK  . TONSILLECTOMY    . VENTRAL HERNIA REPAIR N/A 07/22/2017   Procedure: OPEN VENTRAL HERNIA REPAIR WITH MESH ERAS PATHWAY;  Surgeon: [name], MD;  Location: [place] [facility];  Service: General;  Laterality: N/A;  BILATERAL TAP BLOCK    No family history on file.  Social History:  reports that he has never smoked. He has never used smokeless tobacco. He reports that he drinks alcohol. He reports that he does not use drugs.  Allergies:  Allergies  Allergen Reactions  . Ezetimibe Other (See Comments)    Broke out in sores  . Omeprazole Nausea And Vomiting  . Statins Other (See Comments)    Muscle pains   Prior to Admission medications   Medication Sig Start Date End Date Taking? Authorizing Provider  Cholecalciferol (VITAMIN D-3) 5000 units TABS Take 5,000 Units by mouth daily.     [provider]  diphenhydrAMINE (BENADRYL) 25 MG tablet Take 25 mg by mouth daily as needed (for fluid behind eardrum).    [provider]  fluticasone (FLONASE) 50 MCG/ACT nasal spray Place 1 spray into both nostrils daily as needed (for fluid behing eardrum).    [provider]  methocarbamol (ROBAXIN) 750 MG tablet Take 0.5 tablets (375 mg total) by mouth every 8 (eight) hours as needed (use for muscle cramps/pain). 07/27/17   [name], MD  methocarbamol (ROBAXIN) 750 MG tablet Take 0.5 tablets (375 mg total) by mouth every 8 (eight) hours as needed (use for muscle cramps/pain). 07/27/17   [name], MD  methocarbamol 500 mg in dextrose 5 % 50 mL Inject 500 mg into the vein every 8 (eight) hours as needed. 07/27/17   [name], MD  Omega-3 Fatty Acids (FISH OIL) 1200 MG CAPS Take 1,200 mg by mouth daily.    [provider]  oxyCODONE (OXY IR/ROXICODONE) 5 MG immediate release tablet Take 1 tablet (5 mg total) by mouth every 6 (six) hours as needed for moderate pain. 07/27/17   [name], MD     Results for orders placed or performed during the hospital encounter of 08/04/17 (from the past 48 hour(s))  Lipase, blood     Status: None   Collection Time: 08/04/17 10:29 AM  Result Value  Ref Range   Lipase 25 11 - 51 U/L  Comprehensive metabolic panel     Status: Abnormal   Collection Time: 08/04/17 10:29 AM  Result Value Ref Range   Sodium 135 135 - 145 mmol/L   Potassium 4.5 3.5 - 5.1 mmol/L   Chloride 99 (L) 101 - 111 mmol/L   CO2 27 22 - 32 mmol/L   Glucose, Bld 120 (H) 65 - 99 mg/dL   BUN 22 (H) 6 - 20 mg/dL   Creatinine, Ser 9.77 0.61 - 1.24 mg/dL   Calcium 71.5 8.9 - 42.9 mg/dL   Total Protein 6.9 6.5 - 8.1 g/dL   Albumin 3.8 3.5 - 5.0 g/dL   AST 18 15 - 41 U/L   ALT 28 17 - 63 U/L   Alkaline Phosphatase 61 38 - 126 U/L   Total Bilirubin 0.9 0.3 - 1.2 mg/dL   GFR calc non Af Amer >60 >60 mL/min   GFR calc Af  Amer >60 >60 mL/min    Comment: (NOTE) The eGFR has been calculated using the CKD EPI equation. This calculation has not been validated in all clinical situations. eGFR's persistently <60 mL/min signify possible Chronic Kidney Disease.    Anion gap 9 5 - 15  CBC     Status: Abnormal   Collection Time: 08/04/17 10:29 AM  Result Value Ref Range   WBC 16.6 (H) 4.0 - 10.5 K/uL   RBC 4.84 4.22 - 5.81 MIL/uL   Hemoglobin 13.8 13.0 - 17.0 g/dL   HCT 85.9 03.4 - 18.8 %   MCV 88.6 78.0 - 100.0 fL   MCH 28.5 26.0 - 34.0 pg   MCHC 32.2 30.0 - 36.0 g/dL   RDW 85.4 18.2 - 58.2 %   Platelets 288 150 - 400 K/uL    No results found.  Review of Systems  Constitutional: Positive for malaise/fatigue and weight loss. Negative for fever.  HENT: Negative.   Eyes: Negative.   Respiratory: Positive for cough. Negative for hemoptysis, sputum production, shortness of breath and wheezing.   Cardiovascular: Negative.   Gastrointestinal: Positive for abdominal pain, diarrhea, nausea and vomiting. Negative for blood in stool, constipation, heartburn and melena.  Genitourinary: Positive for flank pain.       DECREASED URINE, Easier to void after loose stools  Skin: Negative.   Neurological: Positive for headaches (has  a headache).  Endo/Heme/Allergies: Negative.   Psychiatric/Behavioral: Negative.    Blood pressure (!) 112/50, pulse (!) 123, temperature 98.2 F (36.8 C), resp. rate (!) 98, SpO2 96 %. Physical Exam  Constitutional: He is oriented to person, place, and time. He appears well-developed and well-nourished. No distress.  He rates pain as and 8-9/10, he does not look like he is having that much pain.  HENT:  Head: Normocephalic and atraumatic.  Mouth/Throat: No oropharyngeal exudate.  Eyes: Right eye exhibits no discharge. Left eye exhibits no discharge. No scleral icterus.  Pupils are equal  Neck: Normal range of motion. Neck supple. No JVD present. No tracheal deviation present. No  thyromegaly present.  Cardiovascular: Normal rate, regular rhythm, normal heart sounds and intact distal pulses.   No murmur heard. Respiratory: Effort normal and breath sounds normal. No respiratory distress. He has no wheezes. He has no rales.  GI: Soft. Bowel sounds are normal. He exhibits no distension and no mass. There is no tenderness. There is no rebound and no guarding.  JP bulb is clear serosanguinous fluid.  Drainage is up since sx started  08/02/17  70-80 ml per day  Incision looks fine.  Musculoskeletal: He exhibits no edema or tenderness.  Lymphadenopathy:    He has no cervical adenopathy.  Neurological: He is alert and oriented to person, place, and time. No cranial nerve deficit.  Skin: Skin is warm and dry. No rash noted. He is not diaphoretic. No erythema. No pallor.  Psychiatric: He has a normal mood and affect. His behavior is normal. Judgment and thought content normal.    Assessment/Plan: Nausea, vomiting and diarrhea S/p Open retrorectus ventral hernia repair with drain placement 07/22/17 Headaches Hyperlipidemia  Plan:  Admit to observation.  Hydrate, CT scan with contrast, recheck labs in AM.  JENNINGS,WILLARD 08/04/2017, 12:59 PM

## 2017-08-04 NOTE — ED Notes (Signed)
General surgery PA called, states is aware of this patient and would like to have abdominal xray completed while waiting in ER. Verbal orders given for 2 view abd. Will Creig Hines PA wants ED providers to know that general surgery is aware patient is here.

## 2017-08-05 ENCOUNTER — Observation Stay (HOSPITAL_COMMUNITY): Payer: BLUE CROSS/BLUE SHIELD

## 2017-08-05 DIAGNOSIS — R066 Hiccough: Secondary | ICD-10-CM | POA: Diagnosis present

## 2017-08-05 DIAGNOSIS — Z79899 Other long term (current) drug therapy: Secondary | ICD-10-CM | POA: Diagnosis not present

## 2017-08-05 DIAGNOSIS — Z888 Allergy status to other drugs, medicaments and biological substances status: Secondary | ICD-10-CM | POA: Diagnosis not present

## 2017-08-05 DIAGNOSIS — R112 Nausea with vomiting, unspecified: Secondary | ICD-10-CM | POA: Diagnosis present

## 2017-08-05 DIAGNOSIS — K567 Ileus, unspecified: Secondary | ICD-10-CM | POA: Diagnosis not present

## 2017-08-05 DIAGNOSIS — R51 Headache: Secondary | ICD-10-CM | POA: Diagnosis present

## 2017-08-05 DIAGNOSIS — Z9889 Other specified postprocedural states: Secondary | ICD-10-CM | POA: Diagnosis not present

## 2017-08-05 DIAGNOSIS — R197 Diarrhea, unspecified: Secondary | ICD-10-CM | POA: Diagnosis present

## 2017-08-05 DIAGNOSIS — R110 Nausea: Secondary | ICD-10-CM | POA: Diagnosis present

## 2017-08-05 LAB — URINALYSIS, ROUTINE W REFLEX MICROSCOPIC
BILIRUBIN URINE: NEGATIVE
Glucose, UA: NEGATIVE mg/dL
HGB URINE DIPSTICK: NEGATIVE
Ketones, ur: NEGATIVE mg/dL
Leukocytes, UA: NEGATIVE
NITRITE: NEGATIVE
PROTEIN: NEGATIVE mg/dL
Specific Gravity, Urine: 1.028 (ref 1.005–1.030)
pH: 5 (ref 5.0–8.0)

## 2017-08-05 LAB — C DIFFICILE QUICK SCREEN W PCR REFLEX
C DIFFICILE (CDIFF) TOXIN: NEGATIVE
C DIFFICLE (CDIFF) ANTIGEN: NEGATIVE
C Diff interpretation: NOT DETECTED

## 2017-08-05 LAB — BASIC METABOLIC PANEL
Anion gap: 8 (ref 5–15)
BUN: 17 mg/dL (ref 6–20)
CALCIUM: 8.1 mg/dL — AB (ref 8.9–10.3)
CHLORIDE: 104 mmol/L (ref 101–111)
CO2: 25 mmol/L (ref 22–32)
CREATININE: 1.03 mg/dL (ref 0.61–1.24)
GFR calc Af Amer: >60 mL/min (ref >60)
GFR calc non Af Amer: >60 mL/min (ref >60)
GLUCOSE: 88 mg/dL (ref 65–99)
Potassium: 3.5 mmol/L (ref 3.5–5.1)
Sodium: 137 mmol/L (ref 135–145)

## 2017-08-05 LAB — CBC
HCT: 37.4 % — ABNORMAL LOW (ref 39.0–52.0)
Hemoglobin: 11.9 g/dL — ABNORMAL LOW (ref 13.0–17.0)
MCH: 28.6 pg (ref 26.0–34.0)
MCHC: 31.8 g/dL (ref 30.0–36.0)
MCV: 89.9 fL (ref 78.0–100.0)
PLATELETS: 246 10*3/uL (ref 150–400)
RBC: 4.16 MIL/uL — ABNORMAL LOW (ref 4.22–5.81)
RDW: 13.3 % (ref 11.5–15.5)
WBC: 11.7 10*3/uL — ABNORMAL HIGH (ref 4.0–10.5)

## 2017-08-05 MED ORDER — LOPERAMIDE HCL 2 MG PO CAPS: 2.0000 mg | ORAL_CAPSULE | ORAL | Status: DC | PRN

## 2017-08-05 MED ORDER — LOPERAMIDE HCL 2 MG PO CAPS
2.0000 mg | ORAL_CAPSULE | Freq: Once | ORAL | Status: AC
Start: 2017-08-05 — End: 2017-08-05
  Administered 2017-08-05: 2 mg via ORAL
  Filled 2017-08-05: qty 1

## 2017-08-05 MED ORDER — FAMOTIDINE IN NACL 20-0.9 MG/50ML-% IV SOLN
20.0000 mg | Freq: Two times a day (BID) | INTRAVENOUS | Status: DC
  Administered 2017-08-05 – 2017-08-06 (×4): 20 mg via INTRAVENOUS
  Filled 2017-08-05 (×6): qty 50

## 2017-08-05 MED ORDER — OXYCODONE-ACETAMINOPHEN 5-325 MG PO TABS
1.0000 | ORAL_TABLET | ORAL | Status: DC | PRN
  Administered 2017-08-05: 1 via ORAL
  Administered 2017-08-05 – 2017-08-06 (×5): 2 via ORAL
  Filled 2017-08-05 (×2): qty 2
  Filled 2017-08-05: qty 1
  Filled 2017-08-05 (×3): qty 2

## 2017-08-05 MED ORDER — SODIUM CHLORIDE 0.9 % IV SOLN
12.5000 mg | Freq: Three times a day (TID) | INTRAVENOUS | Status: DC | PRN
  Administered 2017-08-05: 12.5 mg via INTRAVENOUS
  Filled 2017-08-05 (×2): qty 0.5

## 2017-08-05 MED ORDER — KCL IN DEXTROSE-NACL 20-5-0.45 MEQ/L-%-% IV SOLN
INTRAVENOUS | Status: DC
  Administered 2017-08-05: 23:00:00 via INTRAVENOUS
  Administered 2017-08-05: 1000 mL via INTRAVENOUS
  Filled 2017-08-05 (×3): qty 1000

## 2017-08-05 NOTE — Progress Notes (Signed)
Pt resting in bed quietly. Easily aroused. C/o abdominal pain with pain management administered and effective. Abdominal assessment completed with noted #14 staples that is OTA and dry. bp was noted as 83/60 machine and manual 86/64. Bolus administered per order. Stool collected and sent to lab at 2245. Urine collected and sent to lab at 0218. Ambulatory and steady. Able to make needs known. Pleasant. callbell within reach. Safety maintained. Will continue to monitor.

## 2017-08-05 NOTE — Progress Notes (Signed)
Subjective/Chief Complaint: Having a lot of diarrhea still, some nausea but better, some cramping, c diff negative   Objective: Vital signs in last 24 hours: Temp:  [98.1 F (36.7 C)-98.7 F (37.1 C)] 98.2 F (36.8 C) (09/26 0454) Pulse Rate:  [74-128] 74 (09/26 0454) Resp:  [16-98] 17 (09/26 0454) BP: (83-121)/(50-89) 94/74 (09/26 0454) SpO2:  [93 %-99 %] 98 % (09/26 0454) Weight:  [91.8 kg (202 lb 4.8 oz)] 91.8 kg (202 lb 4.8 oz) (09/25 1846) Last BM Date: 08/05/17  Intake/Output from previous day: 09/25 0701 - 09/26 0700 In: 0  Out: 170 [Drains:170] Intake/Output this shift: Total I/O In: -  Out: 150 [Drains:150]  Resp: clear to auscultation bilaterally Cardio: regular rate and rhythm GI: drain serous, bs present, incision clean without infection  Lab Results:   Recent Labs  08/04/17 1914 08/05/17 0346  WBC 15.7* 11.7*  HGB 13.6 11.9*  HCT 41.3 37.4*  PLT 275 246   BMET  Recent Labs  08/04/17 1029 08/04/17 1914 08/05/17 0346  NA 135  --  137  K 4.5  --  3.5  CL 99*  --  104  CO2 27  --  25  GLUCOSE 120*  --  88  BUN 22*  --  17  CREATININE 1.16 1.02 1.03  CALCIUM 10.1  --  8.1*   PT/INR No results for input(s): LABPROT, INR in the last 72 hours. ABG No results for input(s): PHART, HCO3 in the last 72 hours.  Invalid input(s): PCO2, PO2  Studies/Results: Ct Abdomen Pelvis W Contrast  Result Date: 08/04/2017 CLINICAL DATA:  Abdominal pain. Hernia surgery 07/22/2017 with surgical drain in place. EXAM: CT ABDOMEN AND PELVIS WITH CONTRAST TECHNIQUE: Multidetector CT imaging of the abdomen and pelvis was performed using the standard protocol following bolus administration of intravenous contrast. CONTRAST:  149mL ISOVUE-300 IOPAMIDOL (ISOVUE-300) INJECTION 61% COMPARISON:  Radiographs earlier this day. FINDINGS: Lower chest: Streaky lung base atelectasis. No significant pleural fluid. Hepatobiliary: Diffusely decreased density consistent with  steatosis. No evidence of focal hepatic lesion. Gallbladder physiologically distended without calcified gallstone. Prominence of the common bile duct measuring 10 mm at the porta hepatis, normal tapering distally. Pancreas: No ductal dilatation or inflammation. Spleen: Normal in size without focal abnormality. Adrenals/Urinary Tract: Adrenal glands are unremarkable. Kidneys are normal, without renal calculi, focal lesion, or hydronephrosis. Bladder is unremarkable. Stomach/Bowel: Mild distal esophageal wall thickening with minimal enteric contrast in the distal esophagus. Mild gastric distension. Dilated contrast filled proximal small bowel spanning 3.9 cm greatest dimension. 2.6 cm low-density rounded structure in the proximal small bowel in the left abdomen surrounded by contrast image 48 series 3 is likely ingested contents. Fecalization of small bowel contents in the mid abdomen just subjacent to postsurgical change of the anterior abdominal wall. Minimal associated small bowel wall thickening and mesenteric edema. There is relative transition in this region to nondilated distal small bowel which is fluid-filled. Appendix not confidently visualized. The colon is decompressed. Vascular/Lymphatic: Trace distal abdominal bi-iliac atherosclerosis. No aneurysm. No adenopathy. Reproductive: Heterogeneous prostate gland. Other: Postsurgical change the ventral abdominal wall hernia with surgical drain in place. Only minimal fluid associated with surgical drain. No recurrent hernia. Small amount of dependent free fluid in the pelvis without loculated intra-abdominal fluid collection or abscess. There is no free air. Midline skin staples in place without subcutaneous fluid collection. Fat within the left inguinal canal. Musculoskeletal: There are no acute or suspicious osseous abnormalities. IMPRESSION: 1. Proximal small bowel dilatation, with  gradual transition in the region of recent ventral abdominal wall hernia repair  in the anterior mid abdomen. Findings may reflect postoperative ileus versus developing small bowel obstruction. No evidence of recurrent hernia or focal abscess. Surgical drain remains in place. 2. Prominent common bile duct with normal tapering distally, likely spurious in the setting of normal LFTs. 3. Hepatic steatosis. 4. Minimal distal aorta bi-iliac atherosclerosis. Electronically Signed   By: Jeb Levering M.D.   On: 08/04/2017 21:32   Dg Abd 2 Views  Result Date: 08/04/2017 CLINICAL DATA:  Abdominal pain, nausea and vomiting, recent surgery for hernia repair on 07/22/2017 EXAM: ABDOMEN - 2 VIEW COMPARISON:  None. FINDINGS: I drainage tube coils over the low mid abdomen with the tip extending the right abdomen. No definite bowel obstruction is seen. However with slight gaseous distention of small bowel, and very little large bowel gas, a developing partial small bowel obstruction would be a consideration. No bony abnormality is seen. IMPRESSION: Slightly prominent small bowel loops. Cannot exclude developing partial SBO. Recommend CT the abdomen pelvis or followup plain films with supine and erect images to assess further . Electronically Signed   By: Ivar Drape M.D.   On: 08/04/2017 14:35   Dg Abd Portable 1v  Result Date: 08/05/2017 CLINICAL DATA:  Ileus.  Recent abdominal surgery. EXAM: PORTABLE ABDOMEN - 1 VIEW COMPARISON:  CT from yesterday FINDINGS: Unchanged proximal small bowel gaseous dilatation. Oral contrast administered yesterday is distributed throughout nondilated colon, also seen in the bladder. No concerning mass effect or gas collection. Surgical drain is in place. IMPRESSION: Negative for high grade bowel obstruction. Oral contrast administered yesterday is distributed throughout nondilated colon. Unchanged proximal small bowel dilatation which may be focal ileus. Electronically Signed   By: Monte Fantasia M.D.   On: 08/05/2017 07:37    Anti-infectives: Anti-infectives     None      Assessment/Plan: S/p ventral hernia repair with mesh  Ct doesn't show anything concerning, I think likely has enteritis, c diff is negative Will let him have clears today. Xray shows possible ileus contrast from ct in colon immodium for cramping Lovenox, scds Hopefully home 1-2 days  [facility] 08/05/2017

## 2017-08-06 LAB — CBC
HCT: 37.3 % — ABNORMAL LOW (ref 39.0–52.0)
Hemoglobin: 11.8 g/dL — ABNORMAL LOW (ref 13.0–17.0)
MCH: 28.7 pg (ref 26.0–34.0)
MCHC: 31.6 g/dL (ref 30.0–36.0)
MCV: 90.8 fL (ref 78.0–100.0)
PLATELETS: 231 10*3/uL (ref 150–400)
RBC: 4.11 MIL/uL — ABNORMAL LOW (ref 4.22–5.81)
RDW: 13.2 % (ref 11.5–15.5)
WBC: 6.8 10*3/uL (ref 4.0–10.5)

## 2017-08-06 NOTE — Discharge Instructions (Signed)
[facility]      [facility] [phone]  OPEN ABDOMINAL SURGERY: POST OP INSTRUCTIONS  Always review your discharge instruction sheet given to you by the facility where your surgery was performed.  IF YOU HAVE DISABILITY OR FAMILY LEAVE FORMS, YOU MUST BRING THEM TO THE OFFICE FOR PROCESSING.  PLEASE DO NOT GIVE THEM TO YOUR DOCTOR.  1. A prescription for pain medication may be given to you upon discharge.  Take your pain medication as prescribed, if needed.  If narcotic pain medicine is not needed, then you may take acetaminophen (Tylenol) or ibuprofen (Advil) as needed. 2. Take your usually prescribed medications unless otherwise directed. 3. If you need a refill on your pain medication, please contact your pharmacy. They will contact our office to request authorization.  Prescriptions will not be filled after 5pm or on week-ends. 4. You should follow a light diet the first few days after arrival home, such as soup and crackers, pudding, etc.unless your doctor has advised otherwise. A high-fiber, low fat diet can be resumed as tolerated.   Be sure to include lots of fluids daily. Most patients will experience some swelling and bruising on the chest and neck area.  Ice packs will help.  Swelling and bruising can take several days to resolve 5. Most patients will experience some swelling and bruising in the area of the incision. Ice pack will help. Swelling and bruising can take several days to resolve..  6. It is common to experience some constipation if taking pain medication after surgery.  Increasing fluid intake and taking a stool softener will usually help or prevent this problem from occurring.  A mild laxative (Milk of Magnesia or Miralax) should be taken according to package directions if there are no bowel movements after 48 hours. 7.  You may have steri-strips (small skin tapes) in place directly over the incision.  These strips should be left on the skin for 7-10 days.  If your  surgeon used skin glue on the incision, you may shower in 24 hours.  The glue will flake off over the next 2-3 weeks.  Any sutures or staples will be removed at the office during your follow-up visit. You may find that a light gauze bandage over your incision may keep your staples from being rubbed or pulled. You may shower and replace the bandage daily. 8. ACTIVITIES:  You may resume regular (light) daily activities beginning the next day--such as daily self-care, walking, climbing stairs--gradually increasing activities as tolerated.  You may have sexual intercourse when it is comfortable.  Refrain from any heavy lifting or straining until approved by your doctor. a. You may drive when you no longer are taking prescription pain medication, you can comfortably wear a seatbelt, and you can safely maneuver your car and apply brakes b. Return to Work: ___________________________________ 9. You should see your doctor in the office for a follow-up appointment approximately two weeks after your surgery.  Make sure that you call for this appointment within a day or two after you arrive home to insure a convenient appointment time. OTHER INSTRUCTIONS:  _____________________________________________________________ _____________________________________________________________  WHEN TO CALL YOUR DOCTOR: 1. Fever over 101.0 2. Inability to urinate 3. Nausea and/or vomiting 4. Extreme swelling or bruising 5. Continued bleeding from incision. 6. Increased pain, redness, or drainage from the incision. 7. Difficulty swallowing or breathing 8. Muscle cramping or spasms. 9. Numbness or tingling in hands or feet or around lips.  The clinic staff is available to   answer your questions during regular business hours.  Please don't hesitate to call and ask to speak to one of the nurses if you have concerns.  For further questions, please visit [email or web address]   

## 2017-08-06 NOTE — Progress Notes (Signed)
Randall Best to be D/C'd home per MD order.  Discussed with the patient and all questions fully answered.  VSS, Skin clean, dry and intact without evidence of skin break down, no evidence of skin tears noted. Patient had abdominal staples removed, steri strips applied, & jackson pratt redressed. IV catheter discontinued intact. Site without signs and symptoms of complications. Dressing and pressure applied.  An After Visit Summary was printed and given to the patient. Patient received prescription.  D/c education completed with patient/family including follow up instructions, medication list, d/c activities limitations if indicated, with other d/c instructions as indicated by MD - patient able to verbalize understanding, all questions fully answered.   Patient instructed to return to ED, call 911, or call MD for any changes in condition.   Patient escorted via [place], and D/C home via private auto.  [name] 08/06/2017 8:07 PM

## 2017-08-06 NOTE — Progress Notes (Addendum)
Subjective/Chief Complaint: Feels much better, has appetite back, having flatus and a more normal stool, no n/v   Objective: Vital signs in last 24 hours: Temp:  [97.6 F (36.4 C)-98 F (36.7 C)] 97.8 F (36.6 C) (09/27 0448) Pulse Rate:  [70-98] 98 (09/27 0448) Resp:  [17-18] 17 (09/27 0448) BP: (95-126)/(62-89) 109/81 (09/27 0448) SpO2:  [99 %] 99 % (09/27 0448) Last BM Date: 08/05/17  Intake/Output from previous day: 09/26 0701 - 09/27 0700 In: 4182.5 [P.O.:700; I.V.:3307.5; IV Piggyback:175] Out: 1800 [Urine:1500; Drains:300] Intake/Output this shift: No intake/output data recorded.  Resp: clear to auscultation bilaterally Cardio: regular rate and rhythm GI: soft mildly tender incision clean drain serous  Lab Results:   Recent Labs  08/05/17 0346 08/06/17 0330  WBC 11.7* 6.8  HGB 11.9* 11.8*  HCT 37.4* 37.3*  PLT 246 231   BMET  Recent Labs  08/04/17 1029 08/04/17 1914 08/05/17 0346  NA 135  --  137  K 4.5  --  3.5  CL 99*  --  104  CO2 27  --  25  GLUCOSE 120*  --  88  BUN 22*  --  17  CREATININE 1.16 1.02 1.03  CALCIUM 10.1  --  8.1*   PT/INR No results for input(s): LABPROT, INR in the last 72 hours. ABG No results for input(s): PHART, HCO3 in the last 72 hours.  Invalid input(s): PCO2, PO2  Studies/Results: Ct Abdomen Pelvis W Contrast  Result Date: 08/04/2017 CLINICAL DATA:  Abdominal pain. Hernia surgery 07/22/2017 with surgical drain in place. EXAM: CT ABDOMEN AND PELVIS WITH CONTRAST TECHNIQUE: Multidetector CT imaging of the abdomen and pelvis was performed using the standard protocol following bolus administration of intravenous contrast. CONTRAST:  112mL ISOVUE-300 IOPAMIDOL (ISOVUE-300) INJECTION 61% COMPARISON:  Radiographs earlier this day. FINDINGS: Lower chest: Streaky lung base atelectasis. No significant pleural fluid. Hepatobiliary: Diffusely decreased density consistent with steatosis. No evidence of focal hepatic lesion.  Gallbladder physiologically distended without calcified gallstone. Prominence of the common bile duct measuring 10 mm at the porta hepatis, normal tapering distally. Pancreas: No ductal dilatation or inflammation. Spleen: Normal in size without focal abnormality. Adrenals/Urinary Tract: Adrenal glands are unremarkable. Kidneys are normal, without renal calculi, focal lesion, or hydronephrosis. Bladder is unremarkable. Stomach/Bowel: Mild distal esophageal wall thickening with minimal enteric contrast in the distal esophagus. Mild gastric distension. Dilated contrast filled proximal small bowel spanning 3.9 cm greatest dimension. 2.6 cm low-density rounded structure in the proximal small bowel in the left abdomen surrounded by contrast image 48 series 3 is likely ingested contents. Fecalization of small bowel contents in the mid abdomen just subjacent to postsurgical change of the anterior abdominal wall. Minimal associated small bowel wall thickening and mesenteric edema. There is relative transition in this region to nondilated distal small bowel which is fluid-filled. Appendix not confidently visualized. The colon is decompressed. Vascular/Lymphatic: Trace distal abdominal bi-iliac atherosclerosis. No aneurysm. No adenopathy. Reproductive: Heterogeneous prostate gland. Other: Postsurgical change the ventral abdominal wall hernia with surgical drain in place. Only minimal fluid associated with surgical drain. No recurrent hernia. Small amount of dependent free fluid in the pelvis without loculated intra-abdominal fluid collection or abscess. There is no free air. Midline skin staples in place without subcutaneous fluid collection. Fat within the left inguinal canal. Musculoskeletal: There are no acute or suspicious osseous abnormalities. IMPRESSION: 1. Proximal small bowel dilatation, with gradual transition in the region of recent ventral abdominal wall hernia repair in the anterior mid abdomen. Findings  may  reflect postoperative ileus versus developing small bowel obstruction. No evidence of recurrent hernia or focal abscess. Surgical drain remains in place. 2. Prominent common bile duct with normal tapering distally, likely spurious in the setting of normal LFTs. 3. Hepatic steatosis. 4. Minimal distal aorta bi-iliac atherosclerosis. Electronically Signed   By: Jeb Levering M.D.   On: 08/04/2017 21:32   Dg Abd 2 Views  Result Date: 08/04/2017 CLINICAL DATA:  Abdominal pain, nausea and vomiting, recent surgery for hernia repair on 07/22/2017 EXAM: ABDOMEN - 2 VIEW COMPARISON:  None. FINDINGS: I drainage tube coils over the low mid abdomen with the tip extending the right abdomen. No definite bowel obstruction is seen. However with slight gaseous distention of small bowel, and very little large bowel gas, a developing partial small bowel obstruction would be a consideration. No bony abnormality is seen. IMPRESSION: Slightly prominent small bowel loops. Cannot exclude developing partial SBO. Recommend CT the abdomen pelvis or followup plain films with supine and erect images to assess further . Electronically Signed   By: Ivar Drape M.D.   On: 08/04/2017 14:35   Dg Abd Portable 1v  Result Date: 08/05/2017 CLINICAL DATA:  Ileus.  Recent abdominal surgery. EXAM: PORTABLE ABDOMEN - 1 VIEW COMPARISON:  CT from yesterday FINDINGS: Unchanged proximal small bowel gaseous dilatation. Oral contrast administered yesterday is distributed throughout nondilated colon, also seen in the bladder. No concerning mass effect or gas collection. Surgical drain is in place. IMPRESSION: Negative for high grade bowel obstruction. Oral contrast administered yesterday is distributed throughout nondilated colon. Unchanged proximal small bowel dilatation which may be focal ileus. Electronically Signed   By: Monte Fantasia M.D.   On: 08/05/2017 07:37    Anti-infectives: Anti-infectives    None      Assessment/Plan: POD 13  open vh repair with mesh, likely viral illness  I think this is likely viral. His wbc was elevated with n/v/d on admission. This is all better with iv fluids basically and wbc now normal and he feels better. Will give fulls today and if does well can go home later today Will leave drain in place. This output is still high    [facility] 08/06/2017

## 2017-08-06 NOTE — Progress Notes (Signed)
Staples removed from abdominal incision, and steri strips applied, per MD orders, pt ambulated in hall, doing well, no nausea, or excessive pain. Patient to be discharged home per MD orders

## 2017-08-10 NOTE — Discharge Summary (Signed)
Physician Discharge Summary  Patient ID: Randall Best MRN: [id number] DOB/AGE: 65-Oct-1952 65 y.o.  Admit date: 08/04/2017 Discharge date: 08/10/2017  Admission Diagnoses: Viral illness vs ileus S/p open ventral hernia repair  Discharge Diagnoses:  Active Problems:   Nausea vomiting and diarrhea   Discharged Condition: good  Hospital Course: 66 yom s/p open retrorectus repair of ventral hernia.  He was discharged home after a few days.  He did well until right before readmission when he had diarrhea associated eventually with n/v. He was admitted. Wbc was elevated but normalized during admission without abx. He was negative for c diff.  He underwent hydration and had diet advanced with resolution of  Symptoms.    Consults: None  Significant Diagnostic Studies: ct scan  Treatments: IV hydration   Disposition: 01-Home or Self Care   Allergies as of 08/06/2017      Reactions   Ezetimibe Other (See Comments)   Broke out in sores   Omeprazole Nausea And Vomiting   Statins Other (See Comments)   Muscle pains      Medication List    STOP taking these medications   methocarbamol 500 mg in dextrose 5 % 50 mL     TAKE these medications   bismuth subsalicylate 009 QZ/30QT suspension Commonly known as:  PEPTO BISMOL Take 30 mLs by mouth every 6 (six) hours as needed for indigestion.   Fish Oil 1200 MG Caps Take 1,200 mg by mouth daily.   methocarbamol 750 MG tablet Commonly known as:  ROBAXIN Take 0.5 tablets (375 mg total) by mouth every 8 (eight) hours as needed (use for muscle cramps/pain).   methocarbamol 750 MG tablet Commonly known as:  ROBAXIN Take 0.5 tablets (375 mg total) by mouth every 8 (eight) hours as needed (use for muscle cramps/pain).   oxyCODONE 5 MG immediate release tablet Commonly known as:  Oxy IR/ROXICODONE Take 1 tablet (5 mg total) by mouth every 6 (six) hours as needed for moderate pain.      Follow-up Information    [name],  MD Follow up in 2 week(s).   Specialty:  General Surgery Contact information: [place] [address] [phone]           Signed: [name] 08/10/2017, 2:39 PM

## 2017-09-28 ENCOUNTER — Encounter: Payer: Self-pay | Admitting: Internal Medicine

## 2017-11-24 ENCOUNTER — Ambulatory Visit (AMBULATORY_SURGERY_CENTER): Payer: Self-pay

## 2017-11-24 VITALS — Ht 69.0 in | Wt 209.0 lb

## 2017-11-24 DIAGNOSIS — Z23 Encounter for immunization: Secondary | ICD-10-CM | POA: Diagnosis not present

## 2017-11-24 DIAGNOSIS — Z1211 Encounter for screening for malignant neoplasm of colon: Secondary | ICD-10-CM

## 2017-11-24 MED ORDER — NA SULFATE-K SULFATE-MG SULF 17.5-3.13-1.6 GM/177ML PO SOLN: 1.0000 | Freq: Once | ORAL | 0 refills | Status: AC

## 2017-11-24 NOTE — Progress Notes (Signed)
Per pt, no allergies to soy or egg products.Pt not taking any weight loss meds or using  O2 at home.  Pt refused emmi video. 

## 2017-11-27 ENCOUNTER — Encounter: Payer: Self-pay | Admitting: Family Medicine

## 2017-12-02 ENCOUNTER — Encounter: Payer: Self-pay | Admitting: Internal Medicine

## 2017-12-08 ENCOUNTER — Ambulatory Visit (AMBULATORY_SURGERY_CENTER): Payer: BLUE CROSS/BLUE SHIELD | Admitting: Internal Medicine

## 2017-12-08 ENCOUNTER — Other Ambulatory Visit: Payer: Self-pay

## 2017-12-08 ENCOUNTER — Encounter: Payer: Self-pay | Admitting: Internal Medicine

## 2017-12-08 VITALS — BP 119/87 | HR 80 | Temp 98.2°F | Resp 13 | Ht 69.0 in | Wt 209.0 lb

## 2017-12-08 DIAGNOSIS — D123 Benign neoplasm of transverse colon: Secondary | ICD-10-CM

## 2017-12-08 DIAGNOSIS — Z1212 Encounter for screening for malignant neoplasm of rectum: Secondary | ICD-10-CM | POA: Diagnosis not present

## 2017-12-08 DIAGNOSIS — I4891 Unspecified atrial fibrillation: Secondary | ICD-10-CM

## 2017-12-08 DIAGNOSIS — Z1211 Encounter for screening for malignant neoplasm of colon: Secondary | ICD-10-CM

## 2017-12-08 HISTORY — DX: Unspecified atrial fibrillation: I48.91

## 2017-12-08 MED ORDER — SODIUM CHLORIDE 0.9 % IV SOLN: 500.0000 mL | Freq: Once | INTRAVENOUS | Status: DC

## 2017-12-08 NOTE — Patient Instructions (Addendum)
YOU HAD AN ENDOSCOPIC PROCEDURE TODAY AT [facility]:   Refer to the procedure report that was given to you for any specific questions about what was found during the examination.  If the procedure report does not answer your questions, please call your gastroenterologist to clarify.  If you requested that your care partner not be given the details of your procedure findings, then the procedure report has been included in a sealed envelope for you to review at your convenience later.  YOU SHOULD EXPECT: Some feelings of bloating in the abdomen. Passage of more gas than usual.  Walking can help get rid of the air that was put into your GI tract during the procedure and reduce the bloating. If you had a lower endoscopy (such as a colonoscopy or flexible sigmoidoscopy) you may notice spotting of blood in your stool or on the toilet paper. If you underwent a bowel prep for your procedure, you may not have a normal bowel movement for a few days.  Please Note:  You might notice some irritation and congestion in your nose or some drainage.  This is from the oxygen used during your procedure.  There is no need for concern and it should clear up in a day or so.  SYMPTOMS TO REPORT IMMEDIATELY:   Following lower endoscopy (colonoscopy or flexible sigmoidoscopy):  Excessive amounts of blood in the stool  Significant tenderness or worsening of abdominal pains  Swelling of the abdomen that is new, acute  Fever of 100F or higher  For urgent or emergent issues, a gastroenterologist can be reached at any hour by calling [phone].   DIET:  We do recommend a small meal at first, but then you may proceed to your regular diet.  Drink plenty of fluids but you should avoid alcoholic beverages for 24 hours.  MEDICATIONS: Continue present medications. Also, start baby aspirin 81 mg tablet by mouth daily.  FOLLOW UP: Follow up with cardiology clinic for evaluation of new onset atrial  fibrillation.  Please see handouts given to you by your recovery nurse.   FOLLOW UP: Patient has new onset atrial fibrillation. Appointment made at [facility] with Dr. Ellyn Hack for tomorow 12/09/17. Arrive at 10:20am for a 10:40am appointment. [address], [address]. Call [phone] for any changes in appointment.  ACTIVITY:  You should plan to take it easy for the rest of today and you should NOT DRIVE or use heavy machinery until tomorrow (because of the sedation medicines used during the test).    FOLLOW UP: Our staff will call the number listed on your records the next business day following your procedure to check on you and address any questions or concerns that you may have regarding the information given to you following your procedure. If we do not reach you, we will leave a message.  However, if you are feeling well and you are not experiencing any problems, there is no need to return our call.  We will assume that you have returned to your regular daily activities without incident.  If any biopsies were taken you will be contacted by phone or by letter within the next 1-3 weeks.  Please call us at [phone] if you have not heard about the biopsies in 3 weeks.   Thank you for allowing [place] to provide for your healthcare needs today.  SIGNATURES/CONFIDENTIALITY: You and/or your care partner have signed paperwork which will be entered into your electronic medical  record.  These signatures attest to the fact that that the information above on your After Visit Summary has been reviewed and is understood.  Full responsibility of the confidentiality of this discharge information lies with you and/or your care-partner. 

## 2017-12-08 NOTE — Op Note (Signed)
[facility] Patient Name: Randall Best Procedure Date: 12/08/2017 10:47 AM MRN: [id number] Endoscopist: Docia Chuck. Henrene Pastor , MD Age: 67 Referring MD:  Date of Birth: [birth year] Gender: Male Account #: [id number] Procedure:                Colonoscopy, with cold snare polypectomy x 1 Indications:              Screening for colorectal malignant neoplasm Medicines:                Monitored Anesthesia Care Procedure:                Pre-Anesthesia Assessment:                           - Prior to the procedure, a History and Physical                            was performed, and patient medications and                            allergies were reviewed. The patient's tolerance of                            previous anesthesia was also reviewed. The risks                            and benefits of the procedure and the sedation                            options and risks were discussed with the patient.                            All questions were answered, and informed consent                            was obtained. Prior Anticoagulants: The patient has                            taken no previous anticoagulant or antiplatelet                            agents. ASA Grade Assessment: II - A patient with                            mild systemic disease. After reviewing the risks                            and benefits, the patient was deemed in                            satisfactory condition to undergo the procedure.                           After obtaining informed consent, the colonoscope  was passed under direct vision. Throughout the                            procedure, the patient's blood pressure, pulse, and                            oxygen saturations were monitored continuously. The                            Model CF-HQ190L [id number] scope was introduced                            through the anus and advanced to the the cecum,                identified by appendiceal orifice and ileocecal                            valve. The ileocecal valve, appendiceal orifice,                            and rectum were photographed. The quality of the                            bowel preparation was excellent. The colonoscopy                            was performed without difficulty. The patient                            tolerated the procedure well. The bowel preparation                            used was SUPREP. Scope In: 10:54:36 AM Scope Out: 11:09:31 AM Scope Withdrawal Time: 0 hours 11 minutes 50 seconds  Total Procedure Duration: 0 hours 14 minutes 55 seconds  Findings:                 A 4 mm polyp was found in the transverse colon. The                            polyp was removed with a cold snare. Resection and                            retrieval were complete.                           Internal hemorrhoids were found during                            retroflexion. Incidental hypertrophic anal papilla.                           The exam was otherwise without abnormality on  direct and retroflexion views. Complications:            No immediate complications. Estimated blood loss:                            None. Estimated Blood Loss:     Estimated blood loss: none. Impression:               - One 4 mm polyp in the transverse colon, removed                            with a cold snare. Resected and retrieved.                           - Internal hemorrhoids.                           - The examination was otherwise normal on direct                            and retroflexion views. Recommendation:           - Repeat colonoscopy in 5 years for surveillance,                            if polyp adenomatous.                           - Patient has a contact number available for                            emergencies. The signs and symptoms of potential                            delayed  complications were discussed with the                            patient. Return to normal activities tomorrow.                            Written discharge instructions were provided to the                            patient.                           - Resume previous diet.                           - Continue present medications.                           - Await pathology results.                           NOTE: The patient was in rate controlled atrial  fibrillation upon arrival. Asymptomatic. Heart rate                            between 90 and 110. Blood pressure normal. New                            diagnosis                           1. Start baby aspirin 81 mg daily                           2. Cardiology clinic appointment this week. Please                            schedule today before patient leaves the endoscopy                            [name], MD 12/08/2017 11:16:10 AM This report has been signed electronically.

## 2017-12-08 NOTE — Progress Notes (Signed)
A and O x3. Report to RN. Tolerated MAC anesthesia well.

## 2017-12-08 NOTE — Progress Notes (Signed)
Pt's states no medical or surgical changes since previsit or office visit. 

## 2017-12-08 NOTE — Progress Notes (Signed)
Called to room to assist during endoscopic procedure.  Patient ID and intended procedure confirmed with present staff. Received instructions for my participation in the procedure from the performing physician.  

## 2017-12-09 ENCOUNTER — Telehealth: Payer: Self-pay | Admitting: *Deleted

## 2017-12-09 ENCOUNTER — Ambulatory Visit (INDEPENDENT_AMBULATORY_CARE_PROVIDER_SITE_OTHER): Payer: BLUE CROSS/BLUE SHIELD | Admitting: Cardiology

## 2017-12-09 VITALS — BP 108/86 | HR 107 | Ht 70.0 in | Wt 206.6 lb

## 2017-12-09 DIAGNOSIS — R079 Chest pain, unspecified: Secondary | ICD-10-CM | POA: Diagnosis not present

## 2017-12-09 DIAGNOSIS — I4819 Other persistent atrial fibrillation: Secondary | ICD-10-CM | POA: Insufficient documentation

## 2017-12-09 DIAGNOSIS — I4891 Unspecified atrial fibrillation: Secondary | ICD-10-CM

## 2017-12-09 DIAGNOSIS — R5383 Other fatigue: Secondary | ICD-10-CM | POA: Insufficient documentation

## 2017-12-09 DIAGNOSIS — R0609 Other forms of dyspnea: Secondary | ICD-10-CM | POA: Diagnosis not present

## 2017-12-09 MED ORDER — DILTIAZEM HCL ER COATED BEADS 120 MG PO CP24: 120.0000 mg | ORAL_CAPSULE | Freq: Every day | ORAL | 6 refills | Status: DC

## 2017-12-09 NOTE — Patient Instructions (Signed)
MEDICATION INSTRUCTIONS  --START DILTIAZEM CD/XR 120 MG CAPSULES ONCE DAILY .     SCHEDULE BOTH  AT [place] has requested that you have an echocardiogram. Echocardiography is a painless test that uses sound waves to create images of your heart. It provides your doctor with information about the size and shape of your heart and how well your heart's chambers and valves are working. This procedure takes approximately one hour. There are no restrictions for this procedure.  AND  Your physician has recommended that you wear an event monitor FOR 30 DAYS. Event monitors are medical devices that record the heart's electrical activity. Doctors most often [place] these monitors to diagnose arrhythmias. Arrhythmias are problems with the speed or rhythm of the heartbeat. The monitor is a small, portable device. You can wear one while you do your normal daily activities. This is usually used to diagnose what is causing palpitations/syncope (passing out).    SCHEDULE AT [place] has requested that you have a lexiscan myoview. For further information please visit [email or web address]. Please follow instruction sheet, as given.      Your physician recommends that you schedule a follow-up appointment in 7-8 [place].   If you need a refill on your cardiac medications before your next appointment, please call your pharmacy.

## 2017-12-09 NOTE — Telephone Encounter (Signed)
  Follow up Call-  Call back number 12/08/2017  Post procedure Call Back phone  # [phone]  Permission to leave phone message Yes  Some recent data might be hidden     Patient questions:  Do you have a fever, pain , or abdominal swelling? No. Pain Score  0 *  Have you tolerated food without any problems? Yes.    Have you been able to return to your normal activities? Yes.    Do you have any questions about your discharge instructions: Diet   No. Medications  No. Follow up visit  No.  Do you have questions or concerns about your Care? No.  Actions: * If pain score is 4 or above: No action needed, pain <4.

## 2017-12-09 NOTE — Progress Notes (Signed)
PCP: Starlyn Skeans, PA-C  Clinic Note: Chief Complaint  Patient presents with  . New Patient (Initial Visit)    fatigue, DOE, chest discomfort.  . Atrial Fibrillation    HPI: Randall Best is a 67 y.o. male who is being seen today for the evaluation of atrial fibrillation at the request of Starlyn Skeans, [place].  At the suggestion of his gastroenterologist Dr. Scarlette Shorts  Jovi Zavadil was undergoing colonoscopy on January 29 by Dr. Henrene Pastor.  He was noted to be in atrial fibrillation with mildly rapid ventricular response.  He was hemodynamically stable and therefore they proceeded with the procedure.  He is now referred for new diagnosis of atrial fibrillation.  Recent Hospitalizations:   September 2018 --> underwent open repair of ventral hernia, then was re-admitted for what appeared to be abdominal ileus  Studies Personally Reviewed - (if available, images/films reviewed: From Epic Chart or Care Everywhere)  None  Colonoscopy January 29 with polypectomy x1  Interval History: Mr. presents today not 100% sure of the reason why, but he does note that for the last few months he has been noticing easy fatigue as well as shortness of breath occasionally.  Usually with exertion.  He generally feels very low energy sometimes worse than others.  He has not had any sensation of irregular heartbeat, noted but he does note that his heart seems to be racing intermittently.  He has not noticed any real chest pain, but he has noticed mild discomfort or tightness/pressure in his chest, usually associated with exertional dyspnea. No PND, orthopnea or edema.  No lightheadedness, dizziness, weakness or syncope/near syncope. No TIA/amaurosis fugax symptoms. No melena, hematochezia, hematuria, or epstaxis. No claudication.  ROS: A comprehensive was performed. Review of Systems  Constitutional: Positive for malaise/fatigue (Has not even noticed having increasing energy with taking more  vitamins or drinking coffee.).  HENT: Negative for congestion and sinus pain.   Respiratory: Positive for shortness of breath.   Cardiovascular:       Per HPI  Gastrointestinal: Negative for nausea and vomiting.       His GI symptoms see him to have resolved  Musculoskeletal: Negative for joint pain.  Neurological: Positive for dizziness (Occasionally when he sits up quickly.). Negative for headaches.  Psychiatric/Behavioral: Negative for memory loss. The patient is not nervous/anxious and does not have insomnia.   All other systems reviewed and are negative.  I have reviewed and (if needed) personally updated the patient's problem list, medications, allergies, past medical and surgical history, social and family history.   Past Medical History:  Diagnosis Date  . Asthma    as child  . Atrial fibrillation, rapid ([place]) 12/08/2017   Diagnosed during colonoscopy -presumably new onset  . GERD (gastroesophageal reflux disease)    use meds prn  . Glaucoma   . Headache    hx  . Hyperlipidemia   . Ventral hernia     Past Surgical History:  Procedure Laterality Date  . INSERTION OF MESH N/A 07/22/2017   Procedure: INSERTION OF MESH;  Surgeon: [name], MD;  Location: [facility];  Service: General;  Laterality: N/A;  BILATERAL TAP BLOCK  . TONSILLECTOMY    . VENTRAL HERNIA REPAIR N/A 07/22/2017   Procedure: OPEN VENTRAL HERNIA REPAIR WITH MESH ERAS PATHWAY;  Surgeon: [name], MD;  Location: [facility];  Service: General;  Laterality: N/A;  BILATERAL TAP BLOCK    Current Meds  Medication Sig  . Cholecalciferol (VITAMIN D3) 3000 units TABS  Take by mouth. Take 5,000 units dailt  . Omega-3 Fatty Acids (FISH OIL) 1200 MG CAPS Take 1,200 mg by mouth daily.  [name] Specialty Vitamins Products (ULTRA MAN PO) Take by mouth daily.   Current Facility-Administered Medications for the 12/09/17 encounter (Office Visit) with [name], MD  Medication  . 0.9 %  sodium chloride infusion      Allergies  Allergen Reactions  . Ezetimibe Other (See Comments)    Broke out in sores/ muscle pain  . Omeprazole Nausea And Vomiting  . Statins Other (See Comments)    Muscle pains, mouth sores    Social History   Tobacco Use  . Smoking status: Never Smoker  . Smokeless tobacco: Never Used  Substance Use Topics  . Alcohol use: Yes    Comment: Rarely  . Drug use: No   Social History   Social History Narrative   He is a married father of 37, grandfather of 1.  Lives with his wife.   He does exercise regularly most days of the week for about 30 minutes.  He walks about 1-3 miles a day.  He will occasionally also walk on the treadmill.  He notes his energy is better if he remains active.    family history includes Diabetes in his mother; Heart failure in his father; Hypertension in his brother.  Wt Readings from Last 3 Encounters:  12/09/17 206 lb 9.6 oz (93.7 kg)  12/08/17 209 lb (94.8 kg)  11/24/17 209 lb (94.8 kg)    PHYSICAL EXAM BP 108/86 (BP Location: Right Arm, Patient Position: Sitting, Cuff Size: Normal)   Pulse (!) 107   Ht 5\' 10"  (1.778 m)   Wt 206 lb 9.6 oz (93.7 kg)   BMI 29.64 kg/m  Physical Exam  Constitutional: He is oriented to person, place, and time. He appears well-developed and well-nourished. No distress.  Healthy-appearing.  Well-groomed  HENT:  Head: Normocephalic and atraumatic.  Mouth/Throat: No oropharyngeal exudate.  Eyes: Conjunctivae and EOM are normal. Pupils are equal, round, and reactive to light. No scleral icterus.  Neck: Normal range of motion. Neck supple. No hepatojugular reflux and no JVD present. Carotid bruit is not present. No thyromegaly present.  Cardiovascular: Normal heart sounds and normal pulses. An irregularly irregular rhythm present. Tachycardia present. PMI is not displaced. Exam reveals no gallop.  No murmur heard. Pulmonary/Chest: Effort normal and breath sounds normal. No respiratory distress. He has no  wheezes. He has no rales.  Abdominal: Soft. Bowel sounds are normal. He exhibits no distension. There is no tenderness.  Musculoskeletal: Normal range of motion. He exhibits no edema or deformity.  Neurological: He is alert and oriented to person, place, and time. No cranial nerve deficit.  Skin: Skin is warm and dry. No rash noted. No erythema.  Psychiatric: He has a normal mood and affect. His behavior is normal. Judgment and thought content normal.  Nursing note and vitals reviewed.    Adult ECG Report  Rate: 107;  Rhythm: atrial fibrillation and Rapid ventricular response.  Nonspecific ST-T wave changes.;  Otherwise normal axis, intervals and durations  Narrative Interpretation: A. fib RVR   Other studies Reviewed: Additional studies/ records that were reviewed today include:  Recent Labs:   Lab Results  Component Value Date   CREATININE 1.03 08/05/2017   BUN 17 08/05/2017   NA 137 08/05/2017   K 3.5 08/05/2017   CL 104 08/05/2017   CO2 25 08/05/2017   No results found for: CHOL,  HDL, LDLCALC, LDLDIRECT, TRIG, CHOLHDL  ASSESSMENT / PLAN: Problem List Items Addressed This Visit    Chest pain with moderate risk for cardiac etiology    He describes it is more of a discomfort or tightness associated with exertional dyspnea and fatigue.  Cannot exclude ischemic etiology for onset of A. fib.  His only real risk factor is hyperlipidemia but his father also had some type of heart disease that he is not sure of. Plan: Myoview stress test and 2D echo      Relevant Orders   ECHOCARDIOGRAM COMPLETE   MYOCARDIAL PERFUSION IMAGING   EKG 12-Lead (Completed)   Exertional dyspnea    Likely related to A. fib, but cannot exclude ischemic etiology. Plan: Check 2D echocardiogram and Myoview      Relevant Orders   ECHOCARDIOGRAM COMPLETE   MYOCARDIAL PERFUSION IMAGING   CARDIAC EVENT MONITOR   EKG 12-Lead (Completed)   Fatigue (Chronic)    This is the main symptom that he was having  for the last month or 2.  I suspect this was probably with the onset of his A. fib, Event monitor to determine A. fib burden to determine if it is persistent versus paroxysmal, and durations of paroxysmal's.  This will help determine plan for either rhythm or rate control. Checking 2D echocardiogram and Myoview to exclude cardiomyopathy or CAD.      Relevant Orders   ECHOCARDIOGRAM COMPLETE   EKG 12-Lead (Completed)   New onset atrial fibrillation (HCC) - Primary (Chronic)    New diagnosis of atrial fibrillation rapid response.  Main symptom is his fatigue but he also has exertional dyspnea with some chest discomfort.  Plan:   Check 2D echocardiogram and have him wear a 30-day event monitor to determine his A. fib burden.  Depending on what the echo shows, and the duration of A. fib, may consider attempted TEE cardioversion with short-term anticoagulation versus a more rate control option.  Needed no more cardiac data to fully assess his risk.  But for now would only recommend aspirin and will start low-dose diltiazem.      Relevant Medications   diltiazem (CARDIZEM CD) 120 MG 24 hr capsule   Other Relevant Orders   ECHOCARDIOGRAM COMPLETE   CARDIAC EVENT MONITOR   EKG 12-Lead (Completed)      Current medicines are reviewed at length with the patient today. (+/- concerns) concerned about may be having to be on blood thinners The following changes have been made:See below  This patients CHA2DS2-VASc Score and unadjusted Ischemic Stroke Rate (% per year) is equal to 0.6 % stroke rate/year from a score of 1 -- has not had sufficient Cardiac Evaluation to fully assess. Above score calculated as 1 point each if present [CHF, HTN, DM, Vascular=MI/PAD/Aortic Plaque (only trace plaque noted on CT Abdomen - not adequate for counting), Age if 65-74, or Male]; 2 points each if present [Age > 75, or Stroke/TIA/TE]    Patient Instructions  MEDICATION INSTRUCTIONS  --START DILTIAZEM CD/XR  120 MG CAPSULES ONCE DAILY .     SCHEDULE BOTH  AT [place] has requested that you have an echocardiogram. Echocardiography is a painless test that uses sound waves to create images of your heart. It provides your doctor with information about the size and shape of your heart and how well your heart's chambers and valves are working. This procedure takes approximately one hour. There are no restrictions for this procedure.  AND  Your physician has recommended that you wear an event monitor FOR 30 DAYS. Event monitors are medical devices that record the heart's electrical activity. Doctors most often [place] these monitors to diagnose arrhythmias. Arrhythmias are problems with the speed or rhythm of the heartbeat. The monitor is a small, portable device. You can wear one while you do your normal daily activities. This is usually used to diagnose what is causing palpitations/syncope (passing out).    SCHEDULE AT [facility] has requested that you have a lexiscan myoview. For further information please visit [email or web address]. Please follow instruction sheet, as given.      Your physician recommends that you schedule a follow-up appointment in 7-8 [place].   If you need a refill on your cardiac medications before your next appointment, please call your pharmacy.      Studies Ordered:   Orders Placed This Encounter  Procedures  . MYOCARDIAL PERFUSION IMAGING  . CARDIAC EVENT MONITOR  . EKG 12-Lead  . ECHOCARDIOGRAM COMPLETE      [name], M.D., M.S. Interventional Cardiologist   Pager # [phone] Phone # [phone] [place], [place] [address]   Thank you for choosing [facility]!!

## 2017-12-10 ENCOUNTER — Encounter: Payer: Self-pay | Admitting: Internal Medicine

## 2017-12-11 HISTORY — PX: NM MYOVIEW LTD: HXRAD82

## 2017-12-11 HISTORY — PX: TRANSTHORACIC ECHOCARDIOGRAM: SHX275

## 2017-12-12 ENCOUNTER — Encounter: Payer: Self-pay | Admitting: Cardiology

## 2017-12-12 NOTE — Assessment & Plan Note (Signed)
He describes it is more of a discomfort or tightness associated with exertional dyspnea and fatigue.  Cannot exclude ischemic etiology for onset of A. fib.  His only real risk factor is hyperlipidemia but his father also had some type of heart disease that he is not sure of. Plan: Myoview stress test and 2D echo

## 2017-12-12 NOTE — Assessment & Plan Note (Signed)
This is the main symptom that he was having for the last month or 2.  I suspect this was probably with the onset of his A. fib, Event monitor to determine A. fib burden to determine if it is persistent versus paroxysmal, and durations of paroxysmal's.  This will help determine plan for either rhythm or rate control. Checking 2D echocardiogram and Myoview to exclude cardiomyopathy or CAD.

## 2017-12-12 NOTE — Assessment & Plan Note (Signed)
New diagnosis of atrial fibrillation rapid response.  Main symptom is his fatigue but he also has exertional dyspnea with some chest discomfort.  Plan:   Check 2D echocardiogram and have him wear a 30-day event monitor to determine his A. fib burden.  Depending on what the echo shows, and the duration of A. fib, may consider attempted TEE cardioversion with short-term anticoagulation versus a more rate control option.  Needed no more cardiac data to fully assess his risk.  But for now would only recommend aspirin and will start low-dose diltiazem.

## 2017-12-12 NOTE — Assessment & Plan Note (Signed)
Likely related to A. fib, but cannot exclude ischemic etiology. Plan: Check 2D echocardiogram and Myoview

## 2017-12-16 ENCOUNTER — Telehealth (HOSPITAL_COMMUNITY): Payer: Self-pay

## 2017-12-16 NOTE — Telephone Encounter (Signed)
Encounter complete. 

## 2017-12-18 ENCOUNTER — Ambulatory Visit (HOSPITAL_COMMUNITY)
Admission: RE | Admit: 2017-12-18 | Discharge: 2017-12-18 | Disposition: A | Discharge status: Home / Self Care | Payer: BLUE CROSS/BLUE SHIELD | Source: Ambulatory Visit | Attending: Cardiology | Admitting: Cardiology

## 2017-12-18 ENCOUNTER — Encounter (HOSPITAL_COMMUNITY): Payer: Self-pay

## 2017-12-18 DIAGNOSIS — I4891 Unspecified atrial fibrillation: Secondary | ICD-10-CM | POA: Diagnosis not present

## 2017-12-18 DIAGNOSIS — R0609 Other forms of dyspnea: Secondary | ICD-10-CM

## 2017-12-18 DIAGNOSIS — R079 Chest pain, unspecified: Secondary | ICD-10-CM | POA: Insufficient documentation

## 2017-12-18 LAB — MYOCARDIAL PERFUSION IMAGING
CSEPPHR: 114 {beats}/min
LVDIAVOL: 130 mL (ref 62–150)
LVSYSVOL: 74 mL
Rest HR: 100 {beats}/min
SDS: 1
SRS: 3
SSS: 4
TID: 1.08

## 2017-12-18 MED ORDER — AMINOPHYLLINE 25 MG/ML IV SOLN
75.0000 mg | Freq: Once | INTRAVENOUS | Status: AC
  Administered 2017-12-18: 75 mg via INTRAVENOUS

## 2017-12-18 MED ORDER — REGADENOSON 0.4 MG/5ML IV SOLN
0.4000 mg | Freq: Once | INTRAVENOUS | Status: AC
  Administered 2017-12-18: 0.4 mg via INTRAVENOUS

## 2017-12-18 MED ORDER — TECHNETIUM TC 99M TETROFOSMIN IV KIT
10.5000 | PACK | Freq: Once | INTRAVENOUS | Status: AC | PRN
  Administered 2017-12-18: 10.5 via INTRAVENOUS
  Filled 2017-12-18: qty 11

## 2017-12-18 MED ORDER — TECHNETIUM TC 99M TETROFOSMIN IV KIT
32.1000 | PACK | Freq: Once | INTRAVENOUS | Status: AC | PRN
  Administered 2017-12-18: 32.1 via INTRAVENOUS
  Filled 2017-12-18: qty 33

## 2017-12-18 NOTE — Progress Notes (Unsigned)
Pt had experienced episodes of dizziness short in duration at appointment.  11:40 am Pt BP was sitting ---146/86 and a heart rate of 92 bpm.  11:41 am Pt was checked to see if he was orthostatic however his standing systolic BP was 297 and HR was 92-110 bpm however pt was in AFIB at that time showing a variant consistent with his chronic AFIB. 11:43 am Pt did not have any changes in symptoms and showed no signs of being orthostatic.  11:46 am Pt glucose was checked and was 80 mg/dl after he was given a cup of orange juice 30 minutes prior to episode when getting off camera table at end of study.   11:58 Pt was given 2nd juice and crackers w/peanut butter. MD Ellyn Hack notified.  12:05 am Pt became symptom free. Per MD Ellyn Hack pt ok to be discharged home. 12:10 Pt assisted with dressing and monitored during ambulation to wife in waiting room. Pt denies symptoms. Pt and wife are going to eat lunch prior to drive home.

## 2017-12-22 ENCOUNTER — Ambulatory Visit (INDEPENDENT_AMBULATORY_CARE_PROVIDER_SITE_OTHER): Payer: BLUE CROSS/BLUE SHIELD

## 2017-12-22 ENCOUNTER — Other Ambulatory Visit: Payer: Self-pay

## 2017-12-22 ENCOUNTER — Ambulatory Visit (HOSPITAL_COMMUNITY): Payer: BLUE CROSS/BLUE SHIELD | Attending: Cardiology

## 2017-12-22 ENCOUNTER — Telehealth: Payer: Self-pay | Admitting: Cardiology

## 2017-12-22 DIAGNOSIS — I77819 Aortic ectasia, unspecified site: Secondary | ICD-10-CM | POA: Insufficient documentation

## 2017-12-22 DIAGNOSIS — R079 Chest pain, unspecified: Secondary | ICD-10-CM | POA: Diagnosis not present

## 2017-12-22 DIAGNOSIS — I4891 Unspecified atrial fibrillation: Secondary | ICD-10-CM | POA: Diagnosis not present

## 2017-12-22 DIAGNOSIS — R0609 Other forms of dyspnea: Secondary | ICD-10-CM | POA: Diagnosis not present

## 2017-12-22 DIAGNOSIS — R5383 Other fatigue: Secondary | ICD-10-CM | POA: Diagnosis not present

## 2017-12-22 DIAGNOSIS — I517 Cardiomegaly: Secondary | ICD-10-CM | POA: Insufficient documentation

## 2017-12-22 NOTE — Telephone Encounter (Signed)
Carol ( [employer] ) is calling with a Critical EKG .[name] Thanks

## 2017-12-22 NOTE — Telephone Encounter (Signed)
Spoke with carol, baseline hookup shows atrial fib. Dr harding aware, nothing to do at this time but monitor.

## 2017-12-24 ENCOUNTER — Telehealth: Payer: Self-pay | Admitting: *Deleted

## 2017-12-24 NOTE — Telephone Encounter (Signed)
Spoke to patient. Result given . Verbalized understanding  

## 2017-12-24 NOTE — Telephone Encounter (Signed)
-----   Message from [name], MD sent at 12/23/2017  9:34 PM EST ----- Echocardiogram results shows low normal pump function.  No regional wall motion normalities.  No significant valve lesions.  The results of this test showing relatively normal pump function are more accurate as an assessment of the pump function than the nuclear stress test is.  Especially in the setting of atrial fibrillation.  Overall good news.[name], MD  Please forward to:Starlyn Skeans, PA-C

## 2017-12-30 ENCOUNTER — Telehealth: Payer: Self-pay | Admitting: Cardiology

## 2017-12-30 NOTE — Telephone Encounter (Signed)
Received fax notification from [employer] of AF w/RVR - HR 140bpm around 8:24pm CT on 12/29/17.   MD is aware of known AF per previous notes. Per MD note: CHA2DS2-VASc score and unadjusted Ischemic Stroke Rate (% per year) is equal to 0.6 % stroke rate/year from a score of 1 -- has not had sufficient Cardiac Evaluation to fully assess.  Called patient to inquire about any symptoms around this time (6:30pm). He reports he was not doing anything strenuous at this time - he was sitting down watching sports. He has more shortness of breath today and is more fatigued today. He takes diltiazem QHS and does not monitor BP or HR at home.   Will route to primary cardiologist for recommendations.

## 2017-12-30 NOTE — Telephone Encounter (Signed)
So, it looks as though his Afib is pretty persistent on monitor.  I would like to start him on a DOAC with the plan to pursue Cardioversion as we had discussed. Can probably have him return next week to discuss TEE/DCCV vs. 4 wks DOAC & rate control. (with possible attempt at chemical cardioversion).  ? Can he come this week or next.  For now - lets start Xarelto 20 mg daily (@ dinner) & increase his Diltiazem dose to 2 tabs.   [name], MD

## 2017-12-31 MED ORDER — DILTIAZEM HCL ER COATED BEADS 120 MG PO CP24: 240.0000 mg | ORAL_CAPSULE | Freq: Every day | ORAL | 5 refills | Status: DC

## 2017-12-31 MED ORDER — RIVAROXABAN 20 MG PO TABS: 20.0000 mg | ORAL_TABLET | Freq: Every day | ORAL | 5 refills | Status: DC

## 2017-12-31 NOTE — Telephone Encounter (Signed)
Patient called with MD recommendations. He agreed with plan to increase diltiazem and start on xarelto. He had hesitation about starting anticoagulant for fear of bleeding issues but I explained that the medication itself will not cause bleeding but could exacerbate any underlying issues, such as GI ulcers, hemorrhoids. He would like to discuss TEE/DCCV vs DCCV with his wife and will call back today or tomorrow if he wishes to schedule a sooner appt with MD. At the time of call, MD had availability for office visits next week. Explained each procedure to patient in detail. Notified patient that I will leave a free 30 day voucher (and samples, if any availably, and co-pay card, if he qualifies) to be picked up. He is on his wife's [employer] plan.

## 2018-01-01 ENCOUNTER — Telehealth: Payer: Self-pay | Admitting: Cardiology

## 2018-01-01 DIAGNOSIS — R972 Elevated prostate specific antigen [PSA]: Secondary | ICD-10-CM | POA: Diagnosis not present

## 2018-01-01 DIAGNOSIS — E559 Vitamin D deficiency, unspecified: Secondary | ICD-10-CM | POA: Diagnosis not present

## 2018-01-01 NOTE — Telephone Encounter (Signed)
Attempted to call x2. Line busy

## 2018-01-01 NOTE — Telephone Encounter (Signed)
New message ° °Pt verbalized that he is returning call for RN °

## 2018-01-01 NOTE — Telephone Encounter (Signed)
Received [employer] notification dated 12/30/2017 from 5:24pm CT of AF w/RVR - HR 170bpm. Medication changes have already been made by MD

## 2018-01-04 NOTE — Telephone Encounter (Signed)
Left message for pt to call.

## 2018-01-06 ENCOUNTER — Ambulatory Visit (INDEPENDENT_AMBULATORY_CARE_PROVIDER_SITE_OTHER): Payer: BLUE CROSS/BLUE SHIELD | Admitting: Cardiology

## 2018-01-06 ENCOUNTER — Encounter: Payer: Self-pay | Admitting: Cardiology

## 2018-01-06 VITALS — BP 110/76 | HR 65 | Ht 70.0 in | Wt 214.6 lb

## 2018-01-06 DIAGNOSIS — R079 Chest pain, unspecified: Secondary | ICD-10-CM

## 2018-01-06 DIAGNOSIS — R0609 Other forms of dyspnea: Secondary | ICD-10-CM

## 2018-01-06 DIAGNOSIS — Z01811 Encounter for preprocedural respiratory examination: Secondary | ICD-10-CM

## 2018-01-06 DIAGNOSIS — I4819 Other persistent atrial fibrillation: Secondary | ICD-10-CM

## 2018-01-06 DIAGNOSIS — Z01818 Encounter for other preprocedural examination: Secondary | ICD-10-CM

## 2018-01-06 DIAGNOSIS — I481 Persistent atrial fibrillation: Secondary | ICD-10-CM | POA: Diagnosis not present

## 2018-01-06 LAB — CBC
Hematocrit: 42.7 % (ref 37.5–51.0)
Hemoglobin: 14.1 g/dL (ref 13.0–17.7)
MCH: 29.6 pg (ref 26.6–33.0)
MCHC: 33 g/dL (ref 31.5–35.7)
MCV: 90 fL (ref 79–97)
PLATELETS: 224 10*3/uL (ref 150–379)
RBC: 4.76 x10E6/uL (ref 4.14–5.80)
RDW: 14.8 % (ref 12.3–15.4)
WBC: 9.1 10*3/uL (ref 3.4–10.8)

## 2018-01-06 LAB — BASIC METABOLIC PANEL
BUN / CREAT RATIO: 16 (ref 10–24)
BUN: 15 mg/dL (ref 8–27)
CO2: 24 mmol/L (ref 20–29)
CREATININE: 0.96 mg/dL (ref 0.76–1.27)
Calcium: 9.4 mg/dL (ref 8.6–10.2)
Chloride: 103 mmol/L (ref 96–106)
GFR calc non Af Amer: 83 mL/min/{1.73_m2} (ref >59)
GFR, EST AFRICAN AMERICAN: 95 mL/min/{1.73_m2} (ref >59)
GLUCOSE: 86 mg/dL (ref 65–99)
Potassium: 4.4 mmol/L (ref 3.5–5.2)
SODIUM: 142 mmol/L (ref 134–144)

## 2018-01-06 MED ORDER — FLECAINIDE ACETATE 50 MG PO TABS: 50.0000 mg | ORAL_TABLET | Freq: Two times a day (BID) | ORAL | 6 refills | Status: DC

## 2018-01-06 NOTE — Progress Notes (Signed)
PCP: Starlyn Skeans, PA-C  Clinic Note: Chief Complaint  Patient presents with  . Follow-up    Atrial fibrillation  . Edema    slight swelling in right leg   . Numbness    In left arm, on going for a while     HPI: Randall Best is a 67 y.o. male who is being seen today for the evaluation of atrial fibrillation at the request of Starlyn Skeans, [place].  At the suggestion of his gastroenterologist Dr. Scarlette Shorts when he noted atrial fibrillation during colonoscopy procedure.  Felice Deem was initially seen for consultation on December 09, 2017.  He was relatively asymptomatic just noticing some fatigue and exertional dyspnea.  Occasionally felt his heart rate go up, but nothing significant and no real symptoms to suggest he knew he was  in atrial fibrillation.  We ordered a event monitor, Myoview and followed up echo.  I started him on low-dose diltiazem.  Recent Hospitalizations:   September 2018 --> underwent open repair of ventral hernia, then was re-admitted for what appeared to be abdominal ileus  Studies Personally Reviewed - (if available, images/films reviewed: From Epic Chart or Care Everywhere)  Event monitor showed persistent Afib -monitor return in early because of persistent A. fib.  Several episodes of severe RVR with rates in the 170s were noted.   December 18, 2017 Myoview: Shows reduced EF, but no evidence of ischemia or infarction.  EF 40 and 45%.  INTERMEDIATE RISK due to reduced function.  December 22, 2017 Transthoracic Echo: EF 50 and 55%.  No regional wall motion normalities.  Interval History: Randall Best presents today for follow-up earlier than initially anticipated.  For initial evaluation,I had him wear an event monitor to determine his A. fib burden, and a seemingly was in A. fib the entire time.  We also assessed with a Myoview which that showed reduced EF (likely related to A. Fib) but follow-up echocardiogram showed just low normal EF.   Given  his persistent A. fib an evidence of likely reduction in EF secondary to A. fib, I felt it was prudent to go ahead and have him come in to discuss possible TEE cardioversion with initiation of possible antiarrhythmic agent and at a minimum more rhythm control.  Randall Best does indicate that he is noticing some exertional dyspnea and often sometimes at rest as well.  May be some mild orthopnea.  He has felt his heart rate improved notably since we increased his diltiazem, he is not had any further fast heart rate spells since we made this adjustment about a week ago.  He has not had any lightheadedness or dizziness associated with his irregular heartbeats.  He does not really notice any chest tightness or pressure, just has a little bit of exertional dyspnea.  No real PND or orthopnea symptoms.  No TIA or amaurosis fugax symptoms.  The most notable symptoms he feels is that he just feels tired with less energy than he usually would have.  Since increasing the diltiazem, that has improved.  He was noting some chest discomfort when his heart rate would go fast, but this is essentially resolved with increased dose of diltiazem.  He was started on rivaroxaban a week ago when it became apparent that he had persistent A. fib and the plan was going to be to move forward with cardioversion.  He has not had any issues with melena, hematochezia, hematuria, epistaxis or easy bruising/bleeding. No claudication.   ROS: A comprehensive was performed. Review  of Systems  Constitutional: Positive for malaise/fatigue (Improved with increased diltiazem dose).  HENT: Negative for congestion and sinus pain.   Respiratory: Positive for shortness of breath.   Cardiovascular: Positive for leg swelling (A little bit more in the right leg than the left.).       Per HPI  Gastrointestinal: Negative for nausea and vomiting.       His GI symptoms see him to have resolved  Musculoskeletal: Negative for joint pain.       He has  noted some mild numbness and discomfort in his left arm that comes and goes.  Is been going on for a while  Neurological: Positive for dizziness (Occasionally when he sits up quickly.). Negative for headaches.  Psychiatric/Behavioral: Negative for memory loss. The patient is not nervous/anxious and does not have insomnia.   All other systems reviewed and are negative.  I have reviewed and (if needed) personally updated the patient's problem list, medications, allergies, past medical and surgical history, social and family history.   Past Medical History:  Diagnosis Date  . Asthma    as child  . Atrial fibrillation, rapid ([place]) 12/08/2017   Diagnosed during colonoscopy -presumably new onset  . GERD (gastroesophageal reflux disease)    use meds prn  . Glaucoma   . Headache    hx  . Hyperlipidemia   . Ventral hernia     Past Surgical History:  Procedure Laterality Date  . INSERTION OF MESH N/A 07/22/2017   Procedure: INSERTION OF MESH;  Surgeon: [name], MD;  Location: [facility];  Service: General;  Laterality: N/A;  BILATERAL TAP BLOCK  . TONSILLECTOMY    . VENTRAL HERNIA REPAIR N/A 07/22/2017   Procedure: OPEN VENTRAL HERNIA REPAIR WITH MESH ERAS PATHWAY;  Surgeon: [name], MD;  Location: [facility];  Service: General;  Laterality: N/A;  BILATERAL TAP BLOCK    Current Meds  Medication Sig  . Cholecalciferol (VITAMIN D3) 5000 units TABS Take 1 tablet by mouth daily. Take 5,000 units dailt   . diltiazem (CARDIZEM CD) 120 MG 24 hr capsule Take 2 capsules (240 mg total) by mouth daily.  . Omega-3 Fatty Acids (FISH OIL) 1200 MG CAPS Take 1,200 mg by mouth daily.  . rivaroxaban (XARELTO) 20 MG TABS tablet Take 1 tablet (20 mg total) by mouth daily with supper.  [name] Specialty Vitamins Products (ULTRA MAN PO) Take 1 tablet by mouth daily.    Current Facility-Administered Medications for the 01/06/18 encounter (Office Visit) with [name], MD  Medication  . 0.9 %   sodium chloride infusion    Allergies  Allergen Reactions  . Ezetimibe Other (See Comments)    Broke out in sores/ muscle pain  . Omeprazole Nausea And Vomiting  . Statins Other (See Comments)    Muscle pains, mouth sores    Social History   Tobacco Use  . Smoking status: Never Smoker  . Smokeless tobacco: Never Used  Substance Use Topics  . Alcohol use: Yes    Comment: Rarely  . Drug use: No   Social History   Social History Narrative   He is a married father of 32, grandfather of 1.  Lives with his wife.   He does exercise regularly most days of the week for about 30 minutes.  He walks about 1-3 miles a day.  He will occasionally also walk on the treadmill.  He notes his energy is better if he remains active.    family history  includes Diabetes in his mother; Heart failure in his father; Hypertension in his brother.  Wt Readings from Last 3 Encounters:  01/06/18 214 lb 9.6 oz (97.3 kg)  12/18/17 206 lb (93.4 kg)  12/09/17 206 lb 9.6 oz (93.7 kg)    PHYSICAL EXAM BP 110/76 (BP Location: Right Arm, Patient Position: Sitting, Cuff Size: Normal)   Pulse 65   Ht 5\' 10"  (1.778 m)   Wt 214 lb 9.6 oz (97.3 kg)   BMI 30.79 kg/m  Physical Exam  Constitutional: He is oriented to person, place, and time. He appears well-developed and well-nourished. No distress.  Healthy-appearing.  Well-groomed  HENT:  Head: Normocephalic and atraumatic.  Mouth/Throat: No oropharyngeal exudate.  Eyes: EOM are normal.  Neck: Normal range of motion. Neck supple. No hepatojugular reflux and no JVD present. Carotid bruit is not present. No thyromegaly present.  Cardiovascular: Normal rate, normal heart sounds and normal pulses. An irregularly irregular rhythm present. PMI is not displaced. Exam reveals no gallop.  No murmur heard. Pulmonary/Chest: Effort normal and breath sounds normal. No respiratory distress. He has no wheezes. He has no rales.  Abdominal: Soft. Bowel sounds are normal. He  exhibits no distension. There is no tenderness.  Musculoskeletal: Normal range of motion. He exhibits no edema.  Neurological: He is alert and oriented to person, place, and time. No cranial nerve deficit.  Skin: Skin is warm and dry.  Psychiatric: He has a normal mood and affect. His behavior is normal. Judgment and thought content normal.  Nursing note and vitals reviewed.    Adult ECG Report No EKG done   Other studies Reviewed: Additional studies/ records that were reviewed today include:  Recent Labs:   Lab Results  Component Value Date   CREATININE 0.96 01/06/2018   BUN 15 01/06/2018   NA 142 01/06/2018   K 4.4 01/06/2018   CL 103 01/06/2018   CO2 24 01/06/2018   No results found for: CHOL, HDL, LDLCALC, LDLDIRECT, TRIG, CHOLHDL  ASSESSMENT / PLAN: Problem List Items Addressed This Visit    Chest pain with moderate risk for cardiac etiology    This seems to be more related to when he has his fast heart spells.  He has had more time to come to reflect on his symptoms, and when he notes his heart rate going up is when he starts feeling the discomfort.    Clarification on this is that when we increased his diltiazem, his rate improved and he felt better.  No further chest pain.      Exertional dyspnea    Certainly this is probably related to A. fib.  Were not able to determine diastolic function on an echocardiogram during A. fib so he may very well have some diastolic dysfunction, but with his relatively fast heart rates on occasion that would explain his exertional dyspnea since his heart rates have been going up quite high.  Notably his heart rate is improved on the increased dose of diltiazem which we will continue and then use flecainide for hopeful rhythm control once her cardioverting him.  Otherwise the echo and nuclear stress test were relatively reassuring --being low likelihood of being cardiomyopathy or ischemic CAD.      Relevant Orders   EKG 12-Lead  (Completed)   ECHO TEE   ELECTRICAL CARDIOVERSION   Basic metabolic panel (Completed)   CBC (Completed)   Persistent atrial fibrillation (HCC) - Primary (Chronic)    Initially diagnosed during colonoscopy now has been persistently  in A. fib since his colonoscopy.  He is now been on anticoagulation for 1 week.  We talked about the game plan going forward and what I would like to do is try to chemically cardiovert with flecainide 200 mg, but we will proceed with scheduling TEE cardioversion for next week  In order to minimize his time in A. fib.  We discussed continuing on Xarelto for 3 weeks and coming in for standard cardioversion, but this would then have him being cardioverted after his monitor is turned down.  But I would like to do is cardiovert him while still wearing the monitor after which we can then follow how long he stays out of A. Fib.  Relatively normal echocardiogram and  Myoview stress test.  The Myoview study suggested reduced EF that was not confirmed by echo.  Therefore unlikely that there is any structural or ischemic abnormality of the heart.  Okay to treat with class I antiarrhythmics.  Plan:   Start flecainide 200 mg daily for 2 days then 50 mg twice daily going forward.    Continue Xarelto --depending on recurrence, we can decide whether he stays on Xarelto long-term or only for 1 month post cardioversion.  Continue current dose of diltiazem, but we will ask him to reduce the dose to 120 mg on the day of the cardioversion to avoid significant bradycardia postconversion.  Schedule TEE/DC CV      Relevant Medications   flecainide (TAMBOCOR) 50 MG tablet   Other Relevant Orders   EKG 12-Lead (Completed)   ECHO TEE   ELECTRICAL CARDIOVERSION   Basic metabolic panel (Completed)   CBC (Completed)    Other Visit Diagnoses    Pre-op testing       Relevant Orders   Basic metabolic panel (Completed)   CBC (Completed)   Pre-op chest exam          Current medicines  are reviewed at length with the patient today. (+/- concerns) concerned about may be having to be on blood thinners The following changes have been made:See below  This patients CHA2DS2-VASc Score and unadjusted Ischemic Stroke Rate (% per year) is equal to 0.6 % stroke rate/year from a score of 1 -- has not had sufficient Cardiac Evaluation to fully assess. Above score calculated as 1 point each if present [CHF, HTN, DM, Vascular=MI/PAD/Aortic Plaque (only trace plaque noted on CT Abdomen - not adequate for counting), Age if 65-74, or Male]; 2 points each if present [Age > 75, or Stroke/TIA/TE]    Patient Instructions  MEDICATIONS  INSTRUCTIONS  ---START FLECAINIDE TODAY TAKE 4 TABLETS ( TOTAL 200 MG )  THE STARTING TOMORROW  50 MG TWICE  A DAY  ( ABOUT 12 HOURS APART)   LABS FOR PROCEDURE - TEE/CARDIOVERSION. CBC BMP  SCHEDULE  AT [place] TEST ONLY TAKE 1 TABLET OF DILTIAZEM THAT MORNING Your physician has recommended that you have a  TEE/Cardioversion (DCCV). Electrical Cardioversion uses a jolt of electricity to your heart either through paddles or wired patches attached to your chest. This is a controlled, usually prescheduled, procedure. Defibrillation is done under light anesthesia in the hospital, and you usually go home the day of the procedure. This is done to get your heart back into a normal rhythm. You are not awake for the procedure. Please see the instruction sheet given to you today.     Your physician recommends that you schedule a follow-up appointment in April 2019  Studies Ordered:   Orders Placed This Encounter  Procedures  . ELECTRICAL CARDIOVERSION  . Basic metabolic panel  . CBC  . EKG 12-Lead  . ECHO TEE      [name], M.D., M.S. Interventional Cardiologist   Pager # [phone] Phone # [phone] [address] [place], [place] [address]   Thank you for choosing [facility]!!

## 2018-01-06 NOTE — Patient Instructions (Signed)
MEDICATIONS  INSTRUCTIONS  ---START FLECAINIDE TODAY TAKE 4 TABLETS ( TOTAL 200 MG )  THE STARTING TOMORROW  50 MG TWICE  A DAY  ( ABOUT 12 HOURS APART)   LABS FOR PROCEDURE - TEE/CARDIOVERSION. CBC BMP  SCHEDULE  AT [place] TEST ONLY TAKE 1 TABLET OF DILTIAZEM THAT MORNING Your physician has recommended that you have a  TEE/Cardioversion (DCCV). Electrical Cardioversion uses a jolt of electricity to your heart either through paddles or wired patches attached to your chest. This is a controlled, usually prescheduled, procedure. Defibrillation is done under light anesthesia in the hospital, and you usually go home the day of the procedure. This is done to get your heart back into a normal rhythm. You are not awake for the procedure. Please see the instruction sheet given to you today.     Your physician recommends that you schedule a follow-up appointment in April 2019

## 2018-01-06 NOTE — Telephone Encounter (Signed)
Returned call to patient, who was seen by MD today. He was calling last week to schedule/confirm an appointment with MD. No further assistance needed at this time

## 2018-01-07 ENCOUNTER — Encounter: Payer: Self-pay | Admitting: Cardiology

## 2018-01-07 NOTE — Assessment & Plan Note (Addendum)
Initially diagnosed during colonoscopy now has been persistently in A. fib since his colonoscopy.  He is now been on anticoagulation for 1 week.  We talked about the game plan going forward and what I would like to do is try to chemically cardiovert with flecainide 200 mg, but we will proceed with scheduling TEE cardioversion for next week  In order to minimize his time in A. fib.  We discussed continuing on Xarelto for 3 weeks and coming in for standard cardioversion, but this would then have him being cardioverted after his monitor is turned down.  But I would like to do is cardiovert him while still wearing the monitor after which we can then follow how long he stays out of A. Fib.  Relatively normal echocardiogram and  Myoview stress test.  The Myoview study suggested reduced EF that was not confirmed by echo.  Therefore unlikely that there is any structural or ischemic abnormality of the heart.  Okay to treat with class I antiarrhythmics.  Plan:   Start flecainide 200 mg daily for 2 days then 50 mg twice daily going forward.    Continue Xarelto --depending on recurrence, we can decide whether he stays on Xarelto long-term or only for 1 month post cardioversion.  Continue current dose of diltiazem, but we will ask him to reduce the dose to 120 mg on the day of the cardioversion to avoid significant bradycardia postconversion.  Schedule TEE/DC CV

## 2018-01-07 NOTE — Assessment & Plan Note (Signed)
This seems to be more related to when he has his fast heart spells.  He has had more time to come to reflect on his symptoms, and when he notes his heart rate going up is when he starts feeling the discomfort.    Clarification on this is that when we increased his diltiazem, his rate improved and he felt better.  No further chest pain.

## 2018-01-07 NOTE — Assessment & Plan Note (Signed)
Certainly this is probably related to A. fib.  Were not able to determine diastolic function on an echocardiogram during A. fib so he may very well have some diastolic dysfunction, but with his relatively fast heart rates on occasion that would explain his exertional dyspnea since his heart rates have been going up quite high.  Notably his heart rate is improved on the increased dose of diltiazem which we will continue and then use flecainide for hopeful rhythm control once her cardioverting him.  Otherwise the echo and nuclear stress test were relatively reassuring --being low likelihood of being cardiomyopathy or ischemic CAD.

## 2018-01-08 ENCOUNTER — Telehealth: Payer: Self-pay | Admitting: Cardiology

## 2018-01-08 NOTE — Telephone Encounter (Signed)
Incoming call from [place]. She stated that the patient had a run of rapid afib with a rate of 160 bpm at 12:17 central time.  Patient has been called. He is asymptomatic and did not realize that he had a heart rate of 160. He is scheduled to have a cardioversion on 3/6. Current medications: Diltiazem 240 mg Flecainide 50 mg  bid Xarelto 20 mg  He did state that he was having nausea and diarrhea since starting the flecainide. The nausea has ended and the diarrhea has eased off. He has been advised to give it a few more days and to call back if it does not stop.

## 2018-01-12 NOTE — Telephone Encounter (Signed)
Follow up    Patient states he is filling better and doesn't know if he needs to have the cardioversion , will Dr Debara Pickett check his heart to see if he still needs to have cardioversion done?

## 2018-01-12 NOTE — Telephone Encounter (Signed)
Spoke to patient no strips available . Follow instruction for tomorrow.

## 2018-01-12 NOTE — Telephone Encounter (Addendum)
Spoke to patient, patient thinks he may be in regular rhythm. Patient is schedule -cardioversion  01/13/18.  patient states he has energy and walked 6 miles.  Patient is still wearing event monitor. Instructed patient to send a strip to [facility] to see rhythm. , patient is not able to come to office for ekg. RN CONTACTED [facility]- per represetative -strip has not been upload as of yet. Last reading was @4am  central time - atrial flutter. Patient aware  Will call back later, otherwise  Keep cardioversion appointment tomorrow.

## 2018-01-13 ENCOUNTER — Encounter (HOSPITAL_COMMUNITY): Payer: Self-pay | Admitting: *Deleted

## 2018-01-13 ENCOUNTER — Other Ambulatory Visit: Payer: Self-pay

## 2018-01-13 ENCOUNTER — Observation Stay (HOSPITAL_COMMUNITY)
Admission: RE | Admit: 2018-01-13 | Discharge: 2018-01-14 | Disposition: A | Discharge status: Home / Self Care | Payer: BLUE CROSS/BLUE SHIELD | Source: Ambulatory Visit | Attending: Internal Medicine | Admitting: Internal Medicine

## 2018-01-13 ENCOUNTER — Ambulatory Visit (HOSPITAL_COMMUNITY): Payer: BLUE CROSS/BLUE SHIELD

## 2018-01-13 ENCOUNTER — Ambulatory Visit (HOSPITAL_COMMUNITY): Payer: BLUE CROSS/BLUE SHIELD | Admitting: Certified Registered Nurse Anesthetist

## 2018-01-13 ENCOUNTER — Ambulatory Visit (HOSPITAL_BASED_OUTPATIENT_CLINIC_OR_DEPARTMENT_OTHER): Payer: BLUE CROSS/BLUE SHIELD

## 2018-01-13 ENCOUNTER — Encounter (HOSPITAL_COMMUNITY)
Admission: RE | Disposition: A | Discharge status: Home / Self Care | Payer: Self-pay | Source: Ambulatory Visit | Attending: Internal Medicine

## 2018-01-13 DIAGNOSIS — G43109 Migraine with aura, not intractable, without status migrainosus: Secondary | ICD-10-CM

## 2018-01-13 DIAGNOSIS — H409 Unspecified glaucoma: Secondary | ICD-10-CM | POA: Diagnosis not present

## 2018-01-13 DIAGNOSIS — I081 Rheumatic disorders of both mitral and tricuspid valves: Secondary | ICD-10-CM | POA: Diagnosis not present

## 2018-01-13 DIAGNOSIS — Z66 Do not resuscitate: Secondary | ICD-10-CM | POA: Diagnosis not present

## 2018-01-13 DIAGNOSIS — I4891 Unspecified atrial fibrillation: Secondary | ICD-10-CM

## 2018-01-13 DIAGNOSIS — I4819 Other persistent atrial fibrillation: Secondary | ICD-10-CM | POA: Diagnosis present

## 2018-01-13 DIAGNOSIS — K439 Ventral hernia without obstruction or gangrene: Secondary | ICD-10-CM | POA: Diagnosis not present

## 2018-01-13 DIAGNOSIS — I481 Persistent atrial fibrillation: Principal | ICD-10-CM | POA: Insufficient documentation

## 2018-01-13 DIAGNOSIS — K219 Gastro-esophageal reflux disease without esophagitis: Secondary | ICD-10-CM | POA: Insufficient documentation

## 2018-01-13 DIAGNOSIS — Z8673 Personal history of transient ischemic attack (TIA), and cerebral infarction without residual deficits: Secondary | ICD-10-CM | POA: Insufficient documentation

## 2018-01-13 DIAGNOSIS — R29818 Other symptoms and signs involving the nervous system: Secondary | ICD-10-CM | POA: Diagnosis not present

## 2018-01-13 DIAGNOSIS — E785 Hyperlipidemia, unspecified: Secondary | ICD-10-CM | POA: Diagnosis not present

## 2018-01-13 DIAGNOSIS — Z7901 Long term (current) use of anticoagulants: Secondary | ICD-10-CM | POA: Insufficient documentation

## 2018-01-13 DIAGNOSIS — Z8249 Family history of ischemic heart disease and other diseases of the circulatory system: Secondary | ICD-10-CM | POA: Insufficient documentation

## 2018-01-13 DIAGNOSIS — R531 Weakness: Secondary | ICD-10-CM | POA: Diagnosis not present

## 2018-01-13 DIAGNOSIS — J45909 Unspecified asthma, uncomplicated: Secondary | ICD-10-CM | POA: Insufficient documentation

## 2018-01-13 DIAGNOSIS — Z01818 Encounter for other preprocedural examination: Secondary | ICD-10-CM

## 2018-01-13 DIAGNOSIS — R0609 Other forms of dyspnea: Secondary | ICD-10-CM

## 2018-01-13 DIAGNOSIS — G459 Transient cerebral ischemic attack, unspecified: Secondary | ICD-10-CM

## 2018-01-13 HISTORY — DX: Transient cerebral ischemic attack, unspecified: G45.9

## 2018-01-13 HISTORY — PX: CARDIOVERSION: SHX1299

## 2018-01-13 HISTORY — PX: TEE WITHOUT CARDIOVERSION: SHX5443

## 2018-01-13 LAB — TROPONIN I

## 2018-01-13 LAB — GLUCOSE, CAPILLARY: GLUCOSE-CAPILLARY: 72 mg/dL (ref 65–99)

## 2018-01-13 SURGERY — ECHOCARDIOGRAM, TRANSESOPHAGEAL
Anesthesia: General

## 2018-01-13 MED ORDER — PROPOFOL 500 MG/50ML IV EMUL
INTRAVENOUS | Status: DC | PRN
  Administered 2018-01-13: 100 ug/kg/min via INTRAVENOUS

## 2018-01-13 MED ORDER — RIVAROXABAN 20 MG PO TABS: 20.0000 mg | ORAL_TABLET | Freq: Every day | ORAL | Status: DC

## 2018-01-13 MED ORDER — LIDOCAINE 2% (20 MG/ML) 5 ML SYRINGE
INTRAMUSCULAR | Status: DC | PRN
  Administered 2018-01-13: 60 mg via INTRAVENOUS

## 2018-01-13 MED ORDER — ACETAMINOPHEN 160 MG/5ML PO SOLN: 650.0000 mg | ORAL | Status: DC | PRN

## 2018-01-13 MED ORDER — ACETAMINOPHEN 325 MG PO TABS: 650.0000 mg | ORAL_TABLET | ORAL | Status: DC | PRN

## 2018-01-13 MED ORDER — SODIUM CHLORIDE 0.9 % IV SOLN
INTRAVENOUS | Status: DC
  Administered 2018-01-13: 20:00:00 via INTRAVENOUS

## 2018-01-13 MED ORDER — ACETAMINOPHEN 650 MG RE SUPP: 650.0000 mg | RECTAL | Status: DC | PRN

## 2018-01-13 MED ORDER — OMEGA-3-ACID ETHYL ESTERS 1 G PO CAPS
1.0000 g | ORAL_CAPSULE | Freq: Every day | ORAL | Status: DC
  Administered 2018-01-14: 1 g via ORAL
  Filled 2018-01-13: qty 1

## 2018-01-13 MED ORDER — IOPAMIDOL (ISOVUE-370) INJECTION 76%
50.0000 mL | Freq: Once | INTRAVENOUS | Status: AC | PRN
  Administered 2018-01-13: 50 mL via INTRAVENOUS

## 2018-01-13 MED ORDER — STROKE: EARLY STAGES OF RECOVERY BOOK: Freq: Once | Status: DC

## 2018-01-13 MED ORDER — GLYCOPYRROLATE 0.2 MG/ML IJ SOLN
INTRAMUSCULAR | Status: DC | PRN
  Administered 2018-01-13: 0.2 mg via INTRAVENOUS

## 2018-01-13 MED ORDER — ASPIRIN 325 MG PO TABS
325.0000 mg | ORAL_TABLET | Freq: Every day | ORAL | Status: DC
  Administered 2018-01-13 – 2018-01-14 (×2): 325 mg via ORAL
  Filled 2018-01-13 (×2): qty 1

## 2018-01-13 MED ORDER — SODIUM CHLORIDE 0.9 % IV SOLN
INTRAVENOUS | Status: DC
  Administered 2018-01-13: 12:00:00 via INTRAVENOUS

## 2018-01-13 MED ORDER — PROPOFOL 10 MG/ML IV BOLUS
INTRAVENOUS | Status: DC | PRN
  Administered 2018-01-13: 40 mg via INTRAVENOUS

## 2018-01-13 MED ORDER — SODIUM CHLORIDE 0.9 % IV SOLN: INTRAVENOUS | Status: DC

## 2018-01-13 MED ORDER — ASPIRIN 300 MG RE SUPP: 300.0000 mg | Freq: Every day | RECTAL | Status: DC

## 2018-01-13 MED ORDER — BUTAMBEN-TETRACAINE-BENZOCAINE 2-2-14 % EX AERO
INHALATION_SPRAY | CUTANEOUS | Status: DC | PRN
  Administered 2018-01-13: 2 via TOPICAL

## 2018-01-13 MED ORDER — SENNOSIDES-DOCUSATE SODIUM 8.6-50 MG PO TABS: 1.0000 | ORAL_TABLET | Freq: Every evening | ORAL | Status: DC | PRN

## 2018-01-13 MED ORDER — METOCLOPRAMIDE HCL 5 MG/ML IJ SOLN: 10.0000 mg | Freq: Once | INTRAMUSCULAR | Status: DC

## 2018-01-13 MED ORDER — DILTIAZEM HCL ER COATED BEADS 240 MG PO CP24
240.0000 mg | ORAL_CAPSULE | Freq: Every day | ORAL | Status: DC
  Administered 2018-01-14: 240 mg via ORAL
  Filled 2018-01-13: qty 1

## 2018-01-13 MED ORDER — FLECAINIDE ACETATE 50 MG PO TABS
50.0000 mg | ORAL_TABLET | Freq: Two times a day (BID) | ORAL | Status: DC
  Administered 2018-01-13 – 2018-01-14 (×2): 50 mg via ORAL
  Filled 2018-01-13 (×4): qty 1

## 2018-01-13 NOTE — Anesthesia Postprocedure Evaluation (Addendum)
Anesthesia Post Note  Patient: Randall Best  Procedure(s) Performed: TRANSESOPHAGEAL ECHOCARDIOGRAM (TEE) (N/A ) CARDIOVERSION (N/A )     Patient location during evaluation: PACU Anesthesia Type: General Level of consciousness: awake and alert Pain management: pain level controlled Vital Signs Assessment: post-procedure vital signs reviewed and stable Respiratory status: spontaneous breathing, nonlabored ventilation, respiratory function stable and patient connected to nasal cannula oxygen Cardiovascular status: blood pressure returned to baseline and stable Postop Assessment: no apparent nausea or vomiting Anesthetic complications: no Comments: Shortly after seeing pt in recovery pt developed chest pain, left sided numbness and garbled speech. Code stroke activated and pt sent down to CT scanner.    Last Vitals:  Vitals:   01/13/18 1213 01/13/18 1400  BP: (!) 131/97 115/65  Pulse: 90 100  Resp: (!) 90 19  Temp: 36.4 C 36.6 C  SpO2: 99% 99%    Last Pain:  Vitals:   01/13/18 1400  TempSrc: Axillary                 Tiajuana Amass

## 2018-01-13 NOTE — Anesthesia Preprocedure Evaluation (Signed)
Anesthesia Evaluation  Patient identified by MRN, date of birth, ID band Patient awake    Reviewed: Allergy & Precautions, NPO status , Patient's Chart, lab work & pertinent test results  Airway Mallampati: II  TM Distance: >3 FB Neck ROM: Full    Dental   Pulmonary asthma ,    Pulmonary exam normal        Cardiovascular + dysrhythmias Atrial Fibrillation  Rhythm:Irregular Rate:Normal     Neuro/Psych negative neurological ROS     GI/Hepatic Neg liver ROS, GERD  ,  Endo/Other  negative endocrine ROS  Renal/GU negative Renal ROS     Musculoskeletal   Abdominal   Peds  Hematology negative hematology ROS (+)   Anesthesia Other Findings   Reproductive/Obstetrics                             Lab Results  Component Value Date   WBC 9.1 01/06/2018   HGB 14.1 01/06/2018   HCT 42.7 01/06/2018   MCV 90 01/06/2018   PLT 224 01/06/2018   Lab Results  Component Value Date   CREATININE 0.96 01/06/2018   BUN 15 01/06/2018   NA 142 01/06/2018   K 4.4 01/06/2018   CL 103 01/06/2018   CO2 24 01/06/2018    Anesthesia Physical Anesthesia Plan  ASA: III  Anesthesia Plan: General   Post-op Pain Management:    Induction: Intravenous  PONV Risk Score and Plan: 2 and Ondansetron, Propofol infusion and Treatment may vary due to age or medical condition  Airway Management Planned: Natural Airway, Nasal Cannula and Mask  Additional Equipment:   Intra-op Plan:   Post-operative Plan:   Informed Consent:   Plan Discussed with: CRNA  Anesthesia Plan Comments:         Anesthesia Quick Evaluation

## 2018-01-13 NOTE — H&P (Signed)
History and Physical    Randall Best DGL:[id number] DOB: Jun 27, 1951 DOA: 01/13/2018  PCP: Starlyn Skeans, PA-C Consultants:  Ellyn Hack - cardiology Patient coming from: Home - lives with wife; NOK: Wife, [phone]  Chief Complaint:  Acute onset of neurologic symptoms  HPI: Randall Best is a 67 y.o. male with medical history significant of HLD and afib on Xarelto presenting for routine TEE with cardioversion.  He had the cardioversion and went back into afib.  He then developed acute onset of numbness on the left face, arm and leg.  His face felt very tight on the left.  He had some difficulty with his speech.  He thinks it was related to prolonged effects from the anesthesia.  Symptoms lasted a few minutes and resolved completely.  Denies h/o strokes.  He reports walking 10 miles and 61 flights of stairs yesterday.  He is compliant with his Xarelto.  He does currently have a right-sided frontal headache.  He acknowledges some chest discomfort.   ED Course:  Routine TEE.  Patient awakening with TIA-like symptoms.  Dr. Leonel Ramsay consulted - alternating neuro exam.  CT and CTA negative.  ?complicated migraine vs. Seizure vs. Conversion.  Less likely stroke.  Needs MRI/EEG and telemetry overnight for observation.  He has been anticoagulated on Xarelto and no clots seen on TEE.  Review of Systems: As per HPI; otherwise review of systems reviewed and negative.   Ambulatory Status:  Ambulates without assistance  Past Medical History:  Diagnosis Date  . Asthma    as child  . Atrial fibrillation, rapid ([place]) 12/08/2017   Diagnosed during colonoscopy -presumably new onset  . GERD (gastroesophageal reflux disease)    use meds prn  . Glaucoma   . Headache    remote h/o migraines, none in years  . Hyperlipidemia   . TIA (transient ischemic attack) 01/13/2018  . Ventral hernia     Past Surgical History:  Procedure Laterality Date  . INSERTION OF MESH N/A 07/22/2017   Procedure:  INSERTION OF MESH;  Surgeon: [name], MD;  Location: [facility];  Service: General;  Laterality: N/A;  BILATERAL TAP BLOCK  . TEE WITH CARDIOVERSION  01/13/2018  . TONSILLECTOMY    . VENTRAL HERNIA REPAIR N/A 07/22/2017   Procedure: OPEN VENTRAL HERNIA REPAIR WITH MESH ERAS PATHWAY;  Surgeon: [name], MD;  Location: [facility];  Service: General;  Laterality: N/A;  BILATERAL TAP BLOCK    Social History   Socioeconomic History  . Marital status: Married    Spouse name: Not on file  . Number of children: Not on file  . Years of education: 66  . Highest education level: High school graduate  Social Needs  . Financial resource strain: Not on file  . Food insecurity - worry: Not on file  . Food insecurity - inability: Not on file  . Transportation needs - medical: Not on file  . Transportation needs - non-medical: Not on file  Occupational History  . Occupation: Retired  Tobacco Use  . Smoking status: Never Smoker  . Smokeless tobacco: Never Used  Substance and Sexual Activity  . Alcohol use: Yes    Comment: Rarely  . Drug use: No  . Sexual activity: Not on file  Other Topics Concern  . Not on file  Social History Narrative   He is a married father of 18, grandfather of 1.  Lives with his wife.   He does exercise regularly most days of the week for about  30 minutes.  He walks about 1-3 miles a day.  He will occasionally also walk on the treadmill.  He notes his energy is better if he remains active.    Allergies  Allergen Reactions  . Ezetimibe Other (See Comments)    Broke out in sores/ muscle pain  . Omeprazole Nausea And Vomiting  . Statins Other (See Comments)    Muscle pains, mouth sores    Family History  Problem Relation Age of Onset  . Diabetes Mother        Died at 55  . Heart failure Father        05/07/2023 have had A. fib, died at 87  . Hypertension Brother        He is in his 72s  . CVA Maternal Grandmother        53s  . Colon cancer Neg Hx   .  Esophageal cancer Neg Hx   . Rectal cancer Neg Hx   . Stomach cancer Neg Hx     Prior to Admission medications   Medication Sig Start Date End Date Taking? Authorizing Provider  Cholecalciferol (VITAMIN D3) 5000 units TABS Take 1 tablet by mouth daily. Take 5,000 units dailt    Yes [provider]  diltiazem (CARDIZEM CD) 120 MG 24 hr capsule Take 2 capsules (240 mg total) by mouth daily. 12/31/17  Yes [name], MD  flecainide (TAMBOCOR) 50 MG tablet Take 1 tablet (50 mg total) by mouth 2 (two) times daily. 01/06/18  Yes [name], MD  Omega-3 Fatty Acids (FISH OIL) 1200 MG CAPS Take 1,200 mg by mouth daily.   Yes [provider]  rivaroxaban (XARELTO) 20 MG TABS tablet Take 1 tablet (20 mg total) by mouth daily with supper. 12/31/17  Yes [name], MD  Specialty Vitamins Products (ULTRA MAN PO) Take 1 tablet by mouth daily.    Yes [provider]    Physical Exam: Vitals:   01/13/18 1500 01/13/18 1505 01/13/18 1520 01/13/18 1550  BP:  (!) 154/78 (!) 125/93 (!) 128/96  Pulse:   91 84  Resp:   13 14  Temp: (!) 97.5 F (36.4 C)     TempSrc:      SpO2:   100% 100%  Weight:      Height:         General:  Appears calm and comfortable and is NAD Eyes:  EOMI, normal lids, iris ENT:  grossly normal hearing, lips & tongue, mmm Neck:  no LAD, masses or thyromegaly; no carotid bruits Cardiovascular:  Irregularly irregular, no m/r/g. No LE edema.  Respiratory:   CTA bilaterally with no wheezes/rales/rhonchi.  Normal respiratory effort. Abdomen:  soft, NT, ND, NABS Skin:  no rash or induration seen on limited exam Musculoskeletal:  grossly normal tone BUE/BLE, good ROM, no bony abnormality - apparent inconsistent effort at times, but with distraction appears to have equal strength bilaterally Lower extremity:  R LE edema surrounding the ankle - reports having had a significant injury after a fall several months ago.   2+ distal pulses.   Compression stockings in place. Psychiatric:  grossly normal mood and affect, speech fluent and appropriate, AOx3.  At the end of the evaluation, he reported having some word finding difficulty and mild slurring that was not present earlier.  This appeared to improve with notification that he may not be able to eat/drink with ongoing dysarthria and he reported that he thought his symptoms would improve  further with a drink. Neurologic:  CN 2-12 grossly intact throughout the evaluation and at the end he again reported left facial droop adjacent to his mouth.  He also was able to close both eyes but reported inability to squeeze them shut and inability to lift bilateral eyebrows.  He reports marked decrease in sensation along the entire left face and body.    Radiological Exams on Admission: Ct Angio Head W Or Wo Contrast  Result Date: 01/13/2018 CLINICAL DATA:  Left-sided weakness. EXAM: CT ANGIOGRAPHY HEAD AND NECK TECHNIQUE: Multidetector CT imaging of the head and neck was performed using the standard protocol during bolus administration of intravenous contrast. Multiplanar CT image reconstructions and MIPs were obtained to evaluate the vascular anatomy. Carotid stenosis measurements (when applicable) are obtained utilizing NASCET criteria, using the distal internal carotid diameter as the denominator. CONTRAST:  1mL ISOVUE-370 IOPAMIDOL (ISOVUE-370) INJECTION 76% COMPARISON:  Noncontrast head CT earlier today. FINDINGS: CTA NECK FINDINGS Aortic arch: Mild atherosclerotic calcification. Three vessel branching. Right carotid system: Vessels are smooth and widely patent. No atheromatous changes. Left carotid system: Vessels are smooth and widely patent. No atheromatous changes. Vertebral arteries: No proximal subclavian stenosis or ulceration. Mild right vertebral artery dominance. Both vertebral arteries are tortuous but patent and non beaded. Skeleton: No acute or aggressive finding Other neck: Negative  Upper chest: Negative Review of the MIP images confirms the above findings CTA HEAD FINDINGS Anterior circulation: Right ICA is larger than the left in the setting of left A1 hypoplasia. Intact anterior communicating artery. No branch occlusion or flow limiting stenosis. No noted atheromatous changes. Posterior circulation: Vertebrobasilar arteries are tortuous but smooth, patent, and nondilated. Moderate narrowing of the right mid P2 segment. This is beyond the expected location of the thalamoperforators; there is no visible thalamic infarct. Negative for aneurysm or beading. Venous sinuses: Patent Anatomic variants: As above Delayed phase: Not obtained in the emergent setting. Review of the MIP images confirms the above findings IMPRESSION: 1. No emergent large vessel occlusion. 2. No atherosclerosis or stenosis in the neck. 3. Moderate right P2 segment narrowing, nonspecific in the absence of other atherosclerotic manifestations. Electronically Signed   By: Monte Fantasia M.D.   On: 01/13/2018 15:17   Ct Angio Neck W Or Wo Contrast  Result Date: 01/13/2018 CLINICAL DATA:  Left-sided weakness. EXAM: CT ANGIOGRAPHY HEAD AND NECK TECHNIQUE: Multidetector CT imaging of the head and neck was performed using the standard protocol during bolus administration of intravenous contrast. Multiplanar CT image reconstructions and MIPs were obtained to evaluate the vascular anatomy. Carotid stenosis measurements (when applicable) are obtained utilizing NASCET criteria, using the distal internal carotid diameter as the denominator. CONTRAST:  32mL ISOVUE-370 IOPAMIDOL (ISOVUE-370) INJECTION 76% COMPARISON:  Noncontrast head CT earlier today. FINDINGS: CTA NECK FINDINGS Aortic arch: Mild atherosclerotic calcification. Three vessel branching. Right carotid system: Vessels are smooth and widely patent. No atheromatous changes. Left carotid system: Vessels are smooth and widely patent. No atheromatous changes. Vertebral  arteries: No proximal subclavian stenosis or ulceration. Mild right vertebral artery dominance. Both vertebral arteries are tortuous but patent and non beaded. Skeleton: No acute or aggressive finding Other neck: Negative Upper chest: Negative Review of the MIP images confirms the above findings CTA HEAD FINDINGS Anterior circulation: Right ICA is larger than the left in the setting of left A1 hypoplasia. Intact anterior communicating artery. No branch occlusion or flow limiting stenosis. No noted atheromatous changes. Posterior circulation: Vertebrobasilar arteries are tortuous but smooth, patent, and nondilated. Moderate  narrowing of the right mid P2 segment. This is beyond the expected location of the thalamoperforators; there is no visible thalamic infarct. Negative for aneurysm or beading. Venous sinuses: Patent Anatomic variants: As above Delayed phase: Not obtained in the emergent setting. Review of the MIP images confirms the above findings IMPRESSION: 1. No emergent large vessel occlusion. 2. No atherosclerosis or stenosis in the neck. 3. Moderate right P2 segment narrowing, nonspecific in the absence of other atherosclerotic manifestations. Electronically Signed   By: Monte Fantasia M.D.   On: 01/13/2018 15:17   Ct Head Code Stroke Wo Contrast  Result Date: 01/13/2018 CLINICAL DATA:  Code stroke. 67 year old male with left side weakness. EXAM: CT HEAD WITHOUT CONTRAST TECHNIQUE: Contiguous axial images were obtained from the base of the skull through the vertex without intravenous contrast. COMPARISON:  None. FINDINGS: Brain: Cerebral volume is within normal limits for age. No midline shift, ventriculomegaly, mass effect, evidence of mass lesion, intracranial hemorrhage or evidence of cortically based acute infarction. Gray-white matter differentiation is within normal limits throughout the brain. No cortical encephalomalacia identified. Vascular: Mild Calcified atherosclerosis at the skull base. No  suspicious intracranial vascular hyperdensity. Skull: Intact. Sinuses/Orbits: Clear. Other: Visualized orbits and scalp soft tissues are within normal limits. ASPECTS [facility] Stroke Program Early CT Score) - Ganglionic level infarction (caudate, lentiform nuclei, internal capsule, insula, M1-M3 cortex): 7 - Supraganglionic infarction (M4-M6 cortex): 3 Total score (0-10 with 10 being normal): 10 IMPRESSION: 1. Normal for age noncontrast CT appearance of the brain. 2. ASPECTS is 10. 3. These results were communicated to Dr. Leonel Ramsay at 2:52 pmon 3/6/2019by text page via the [facility] messaging system. Electronically Signed   By: Genevie Ann M.D.   On: 01/13/2018 14:52    EKG: Independently reviewed.  Afib with rate 89; nonspecific ST changes with no evidence of acute ischemia   Labs on Admission: I have personally reviewed the available labs and imaging studies at the time of the admission.  Pertinent labs:   Normal CBC and BMP on 2/27 Glucose today 72   Assessment/Plan Principal Problem:   Transient neurological symptoms Active Problems:   Persistent atrial fibrillation (HCC)   Hyperlipidemia   Transient neurologic symptoms -Patient with acute onset of neurologic symptoms following TEE -He initially reported to me resolution of the symptoms and did not have visible deficits but then his exam was inconsistent with a number of possible deficits -Code stroke initially called, but negative CT, CTA head/neck -Low suspicion for CVA/TIA -Neurology has been consulted -Will observe overnight on telemetry -Will obtain MRI but hold additional CVA work-up at this time -Will request EEG, although seizure would be atypical with this presentation -Atypical migraine, conversion d/o are possible alternative diagnoses -Slightly elevated glucose periodically on chest review, will check A1c  Afib -Patient presented for TEE with cardioversion and this was performed without difficulty -Unfortunately, the  patient reverted to afib following the procedure -He is rate controlled with Diltiazem and also takes BID Flecainide, will continue -He reports compliance with Xarelto, will continue.  Of note, the literature reports miniscule risk of CVA after cardioversion, particularly in an adequately anti-coagulated patient. -Will trend troponin although it is likely to be mildly elevated given the shock administered  HLD -Continue fish oil -Check FLP     DVT prophylaxis: Xarelto Code Status:  DNR - confirmed with patient/family Family Communication: Wife present throughout evaluation  Disposition Plan:  Home once clinically improved Consults called: Cardiology, neurology  Admission status: It is my clinical  opinion that referral for OBSERVATION is reasonable and necessary in this patient based on the above information provided. The aforementioned taken together are felt to place the patient at high risk for further clinical deterioration. However it is anticipated that the patient may be medically stable for discharge from the hospital within 24 to 48 hours.    Karmen Bongo MD [facility]  If note is complete, please contact covering daytime or nighttime physician. [email or web address] Password [facility]  01/13/2018, 4:32 PM

## 2018-01-13 NOTE — H&P (Signed)
   INTERVAL PROCEDURE H&P  History and Physical Interval Note:  01/13/2018 11:58 AM  Randall Best has presented today for their planned procedure. The various methods of treatment have been discussed with the patient and family. After consideration of risks, benefits and other options for treatment, the patient has consented to the procedure.  The patients' outpatient history has been reviewed, patient examined, and no change in status from most recent office note within the past 30 days. I have reviewed the patients' chart and labs and will proceed as planned. Questions were answered to the patient's satisfaction.   Randall Casino, MD, [facility], [place] Director of the Advanced Lipid Disorders &  Cardiovascular Risk Reduction Clinic Diplomate of the American Board of Clinical Lipidology Attending Cardiologist  Direct Dial: [phone]  Fax: [phone]  Website:  [email or web address] 01/13/2018, 11:58 AM

## 2018-01-13 NOTE — Progress Notes (Signed)
Patient hard to arouse from procedure.  After patient made spontaneous movement, patient stated "My chest hurts.  I'm having some chest pain and my arm is numb." Patient was pointing to left arm.  Called Dr. Debara Pickett to bedside who performed a quick assessment.  Patient stated "my face is numb on my left side." Stroke team to be called to the room.  12-Lead EKG taken.  Will continue to monitor patient.

## 2018-01-13 NOTE — Progress Notes (Signed)
Called to the endoscopy recovery area - the patient seemed to be still recovering from anesthesia. He is complaining of chest discomfort and left facial numbness. He also reports left hand and leg weakness. I performed a rapid beside neuro exam and noted 2/5 left hand grip strength and 4/5 right hand strength, left foot extensor strength is also 2/5. Right foot extensor is 4/5. He was noted to drift in an out of alertness - he did have some garbled speech, which became coherent. After a few minutes he was more awake and spontaneous moving his left side and resisting gravity with his left arm. He complained of headache, but quickly said it was present before the procedure. Based on his recurrent findings, I called a CODE STROKE for more rapid evaluation. I also ordered and personally reviewed his EKG which does not show ST elevation. He had been therapeutically anticoagulated on Xarelto. D/w Dr. Ellyn Hack, anesthesia staff and neurology.  Pixie Casino, MD, [facility], [place] Director of the Advanced Lipid Disorders &  Cardiovascular Risk Reduction Clinic Diplomate of the American Board of Clinical Lipidology Attending Cardiologist  Direct Dial: [phone]  Fax: [phone]  Website:  [email or web address]

## 2018-01-13 NOTE — CV Procedure (Signed)
TEE/CARDIOVERSION NOTE  TRANSESOPHAGEAL ECHOCARDIOGRAM (TEE):  Indictation: Atrial Fibrillation  Consent:   Informed consent was obtained prior to the procedure. The risks, benefits and alternatives for the procedure were discussed and the patient comprehended these risks.  Risks include, but are not limited to, cough, sore throat, vomiting, nausea, somnolence, esophageal and stomach trauma or perforation, bleeding, low blood pressure, aspiration, pneumonia, infection, trauma to the teeth and death.    Time Out: Verified patient identification, verified procedure, site/side was marked, verified correct patient position, special equipment/implants available, medications/allergies/relevent history reviewed, required imaging and test results available. Performed  Procedure:  After a procedural time-out, the patient was given Propofol per anesthesia for sedation. The patient's heart rate, blood pressure, and oxygen saturation are monitored continuously during the procedure. The oropharynx was anesthetized 10 cc of topical 1% viscous lidocaine and 2 cetacaine sprays.  The transesophageal probe was inserted in the esophagus and stomach without difficulty and multiple views were obtained. Agitated microbubble saline contrast was not administered.  Complications:    Complications: None Patient did tolerate procedure well.  Findings:  1. LEFT VENTRICLE: The left ventricular wall thickness is mildly increased.  The left ventricular cavity is normal in size. Wall motion is normal.  LVEF is 55-60%.  2. RIGHT VENTRICLE:  The right ventricle is normal in structure and function without any thrombus or masses.    3. LEFT ATRIUM:  The left atrium is mildly dilated in size without any thrombus or masses.  There is not spontaneous echo contrast ("smoke") in the left atrium consistent with a low flow state.  4. LEFT ATRIAL APPENDAGE:  The left atrial appendage is free of any thrombus or masses. The  appendage has single lobes. Pulse doppler indicates moderate flow in the appendage.  5. ATRIAL SEPTUM:  The atrial septum appears intact and is free of thrombus and/or masses.  There is no evidence for interatrial shunting by color doppler.  6. RIGHT ATRIUM:  The right atrium is normal in size and function without any thrombus or masses.  7. MITRAL VALVE:  The mitral valve is normal in structure and function with trace to mild regurgitation.  There were no vegetations or stenosis.  8. AORTIC VALVE:  The aortic valve is trileaflet, normal in structure and function with no regurgitation.  There were no vegetations or stenosis  9. TRICUSPID VALVE:  The tricuspid valve is normal in structure and function with trivial regurgitation.  There were no vegetations or stenosis  10.  PULMONIC VALVE:  The pulmonic valve is normal in structure and function with trivial regurgitation.  There were no vegetations or stenosis.   11. AORTIC ARCH, ASCENDING AND DESCENDING AORTA:  There was no significant Ron Parker et. Al, 1992) atherosclerosis of the ascending aorta, aortic arch, or proximal descending aorta.  12. PULMONARY VEINS: Anomalous pulmonary venous return was not noted.  13. PERICARDIUM: The pericardium appeared normal and non-thickened.  There is no pericardial effusion.  CARDIOVERSION:     Second Time Out: Verified patient identification, verified procedure, site/side was marked, verified correct patient position, special equipment/implants available, medications/allergies/relevent history reviewed, required imaging and test results available.  Performed  Procedure:  1. Patient placed on cardiac monitor, pulse oximetry, supplemental oxygen as necessary.  2. Sedation administered per anesthesia 3. Pacer pads placed anterior and posterior chest. 4. Cardioverted 3 time(s).  5. Cardioverted at 150J, 200J x 2 biphasic.  Complications:  Complications: None - only a brief 3 beats of sinus rhythm was  noted  Patient did tolerate procedure well.  Impression:  1. No LAA thrombus 2. Negative for PFO 3. No significant valvular disease 4. LVEF 55-60% 5. Unsuccessful DCCV  Recommendations:  1. D/w Dr. Ellyn Hack - will plan to increase flecainide to 100 mg BID for several weeks and consider repeat cardioversion.  Time Spent Directly with the Patient:  45 minutes   Pixie Casino, MD, [facility], [place] Director of the Advanced Lipid Disorders &  Cardiovascular Risk Reduction Clinic Diplomate of the American Board of Clinical Lipidology Attending Cardiologist  Direct Dial: [phone]  Fax: [phone]  Website:  [email or web address]  Nadean Corwin Paxtyn Boyar 01/13/2018, 2:20 PM

## 2018-01-13 NOTE — Code Documentation (Signed)
67yo male s/p TEE and cardioversion today.  Patient is anticoagulated with Xarelto with last dose this morning.  LKW 1330.  Post-procedure patient with c/o chest pain and left face and arm numbness which progressed to left sided weakness.  Code stroke activated.  Stroke team to the bedside.  Patient with improvement and delay in CT d/t patient refusing the scan.  Patient began to worsen and patient transported to CT by team.  While transporting patient having episodes of left gaze followed by right gaze both of which resolved with stimulation.  Then patient noted to have head and upper body tremors which also quickly resolved.  Dr. Leonel Ramsay made aware.  CT completed followed by CTA head and neck.  Patient tolerated well.  Patient transported by to endoscopy.  NIHSS 3, see documentation for details and code stroke times.  Patient with face twisted to the right when asked to smile, does not puff out cheeks.  Patient with left arm drift that resolved with distraction.  Patient continues to report numbness in the left face and arm.  Patient with mild dysarthria.  Patient is contraindicated for treatment with tPA d/t Xarelto dose this morning.  Patient is not an endovascular candidate per MD.  Patient and spouse updated on plan of care at the bedside.  Bedside handoff with Anderson Malta, RN.

## 2018-01-13 NOTE — Consult Note (Signed)
Neurology Consultation Reason for Consult: Left Sided weakness Referring Physician: Hilty, K  CC: Left Sided weakness  History is obtained from:patient, referring MD  HPI: Randall Best is a 67 y.o. male who underwent TEE cardioversion today. As he was awakening from anesthesia. He was noted to have left sided weakness.  He subsequently improved and on my initial evaluation was asymptomatic.  He then re-worsen, becoming drowsy and had disconjugate gaze.  When asked to smile he held his mouth in an unusual position, not clearly facial droop but not smiling correctly either.  His left arm give way weakness.  He was taken for stat CT/CTA given the recent cardioversion which was negative for large vessel occlusion.  Following this, he again improved.  He states that he has been having mild headache since before the procedure began.  Of note, he has a history of migraines, and in the past he would get vertigo as well as diplopia with his migraines.   LKW: 1:45 PM tpa given?: no, anticoagulated   ROS: A 14 point ROS was performed and is negative except as noted in the HPI.   Past Medical History:  Diagnosis Date  . Asthma    as child  . Atrial fibrillation, rapid ([place]) 12/08/2017   Diagnosed during colonoscopy -presumably new onset  . GERD (gastroesophageal reflux disease)    use meds prn  . Glaucoma   . Headache    hx  . Hyperlipidemia   . Ventral hernia      Family History  Problem Relation Age of Onset  . Diabetes Mother        Died at 53  . Heart failure Father        May 03, 2023 have had A. fib, died at 4  . Hypertension Brother        He is in his 26s  . Colon cancer Neg Hx   . Esophageal cancer Neg Hx   . Rectal cancer Neg Hx   . Stomach cancer Neg Hx      Social History:  reports that  has never smoked. he has never used smokeless tobacco. He reports that he drinks alcohol. He reports that he does not use drugs.   Exam: Current vital signs: BP 115/65   Pulse  100   Temp 97.8 F (36.6 C) (Axillary)   Resp 19   Ht 5\' 10"  (1.778 m)   Wt 97.1 kg (214 lb)   SpO2 99%   BMI 30.71 kg/m  Vital signs in last 24 hours: Temp:  [97.6 F (36.4 C)-97.8 F (36.6 C)] 97.8 F (36.6 C) (03/06 1400) Pulse Rate:  [90-100] 100 (03/06 1400) Resp:  [19-90] 19 (03/06 1400) BP: (115-131)/(65-97) 115/65 (03/06 1400) SpO2:  [99 %] 99 % (03/06 1400) Weight:  [97.1 kg (214 lb)] 97.1 kg (214 lb) (03/06 1213)   Physical Exam  Constitutional: Appears well-developed and well-nourished.  Psych: Affect appropriate to situation Eyes: No scleral injection HENT: No OP obstrucion Head: Normocephalic.  Cardiovascular: Normal rate and irregular Respiratory: Effort normal, non-labored breathing GI: Soft.  No distension. There is no tenderness.  Skin: WDI  Neuro: Initial, prior to worsening Mental Status: Patient is awake, alert, oriented to person, place, month, year, and situation. Patient is able to give a clear and coherent history. No signs of aphasia or neglect Cranial Nerves: II: Visual Fields are full. Pupils are equal, round, and reactive to light.   III,IV, VI: EOMI without ptosis or diploplia.  V: Facial sensation  is symmetric to temperature VII: Facial movement is symmetric.  VIII: hearing is intact to voice X: Uvula elevates symmetrically XI: Shoulder shrug is symmetric. XII: tongue is midline without atrophy or fasciculations.  Motor: Tone is normal. Bulk is normal. 5/5 strength was present in all four extremities.  Sensory: Sensation is symmetric to light touch and temperature in the arms and legs. Deep Tendon Reflexes: 2+ and symmetric in the biceps and patellae.  Plantars: Toes are downgoing bilaterally.  Cerebellar: FNF and HKS are intact bilaterally  Following worsening, he simply moved his jaw to the side when asked to smile, he did have disconjugate gaze with right eye hourly deviated compared to his left, he had prominent give way  weakness initially given full strength in his left arm and then suddenly giving way this was also present in his left leg.  He complained of decreased sensation on the left side.  Exam was inconsistent.   I have reviewed labs in epic and the results pertinent to this consultation are: BMP from 2/27-unremarkable  I have reviewed the images obtained: CT/CTA-negative  Impression: 67 yo M with transient neurological symptoms of unclear etiology. I feel that stroke/TIA is low likelihood and would not pursue a stroke workup unless MRI is positive.   He has a history of complicated migraine with posterior circulation symptoms (vertigo/diploplia) and this is a possibility. Seizure could be a possibility as well though also I feel unlikely.  Recommendations: 1) MRI brain 2) EEG 3) Reglan 10 mg x1 for possible migraine  4) Will follow.    Roland Rack, MD [facility] [phone]  If 7pm- 7am, please page neurology on call as listed in [place].

## 2018-01-13 NOTE — Anesthesia Procedure Notes (Signed)
Procedure Name: MAC Date/Time: 01/13/2018 1:30 PM Performed by: Lowella Dell, CRNA Pre-anesthesia Checklist: Patient identified, Emergency Drugs available, Suction available, Patient being monitored and Timeout performed Patient Re-evaluated:Patient Re-evaluated prior to induction Oxygen Delivery Method: Nasal cannula Induction Type: IV induction Placement Confirmation: positive ETCO2 Dental Injury: Teeth and Oropharynx as per pre-operative assessment

## 2018-01-13 NOTE — Addendum Note (Signed)
Addendum  created 01/13/18 1442 by Suzette Battiest, MD   Sign clinical note

## 2018-01-13 NOTE — Progress Notes (Signed)
Arrived back to endo 55 from CT.

## 2018-01-13 NOTE — Progress Notes (Signed)
New Admission Note:  Arrival Method: on wheelchair from [place] & oriented x4 Telemetry: M09 Assessment: Completed Skin: intact IV: R AC; R hand Pain:7/10; pt is not requesting intervention for headache.  Safety Measures: Safety Fall Prevention Plan was given, discussed. Admission: Completed 3W34: Patient has been orientated to the room, unit and the staff. Family: wife updated  STROKE/TIA report sheet began and handed off to night RN   Orders have been reviewed and implemented. Will continue to monitor the patient. Call light has been placed within reach and bed alarm has been activated.   Arta Silence ,RN

## 2018-01-13 NOTE — Transfer of Care (Signed)
Immediate Anesthesia Transfer of Care Note  Patient: Randall Best  Procedure(s) Performed: TRANSESOPHAGEAL ECHOCARDIOGRAM (TEE) (N/A ) CARDIOVERSION (N/A )  Patient Location: PACU and Endoscopy Unit  Anesthesia Type:MAC  Level of Consciousness: drowsy and patient cooperative  Airway & Oxygen Therapy: Patient Spontanous Breathing and Patient connected to nasal cannula oxygen  Post-op Assessment: Report given to RN and Post -op Vital signs reviewed and stable  Post vital signs: Reviewed and stable  Last Vitals:  Vitals:   01/13/18 1213  BP: (!) 131/97  Pulse: 90  Resp: (!) 90  Temp: 36.4 C  SpO2: 99%    Last Pain:  Vitals:   01/13/18 1213  TempSrc: Oral         Complications: No apparent anesthesia complications

## 2018-01-13 NOTE — Progress Notes (Signed)
  Echocardiogram Echocardiogram Transesophageal has been performed.  Randall Best L Androw 01/13/2018, 1:57 PM

## 2018-01-14 ENCOUNTER — Observation Stay (HOSPITAL_COMMUNITY): Payer: BLUE CROSS/BLUE SHIELD

## 2018-01-14 ENCOUNTER — Encounter (HOSPITAL_COMMUNITY): Payer: Self-pay | Admitting: Internal Medicine

## 2018-01-14 DIAGNOSIS — I481 Persistent atrial fibrillation: Secondary | ICD-10-CM

## 2018-01-14 DIAGNOSIS — E785 Hyperlipidemia, unspecified: Secondary | ICD-10-CM | POA: Diagnosis not present

## 2018-01-14 DIAGNOSIS — Z7901 Long term (current) use of anticoagulants: Secondary | ICD-10-CM | POA: Diagnosis not present

## 2018-01-14 DIAGNOSIS — Z8673 Personal history of transient ischemic attack (TIA), and cerebral infarction without residual deficits: Secondary | ICD-10-CM | POA: Diagnosis not present

## 2018-01-14 DIAGNOSIS — R29818 Other symptoms and signs involving the nervous system: Secondary | ICD-10-CM | POA: Diagnosis not present

## 2018-01-14 DIAGNOSIS — K219 Gastro-esophageal reflux disease without esophagitis: Secondary | ICD-10-CM | POA: Diagnosis not present

## 2018-01-14 DIAGNOSIS — G459 Transient cerebral ischemic attack, unspecified: Secondary | ICD-10-CM | POA: Diagnosis not present

## 2018-01-14 DIAGNOSIS — H409 Unspecified glaucoma: Secondary | ICD-10-CM | POA: Diagnosis not present

## 2018-01-14 DIAGNOSIS — G43109 Migraine with aura, not intractable, without status migrainosus: Secondary | ICD-10-CM

## 2018-01-14 DIAGNOSIS — Z8249 Family history of ischemic heart disease and other diseases of the circulatory system: Secondary | ICD-10-CM | POA: Diagnosis not present

## 2018-01-14 DIAGNOSIS — Z66 Do not resuscitate: Secondary | ICD-10-CM | POA: Diagnosis not present

## 2018-01-14 DIAGNOSIS — J45909 Unspecified asthma, uncomplicated: Secondary | ICD-10-CM | POA: Diagnosis not present

## 2018-01-14 LAB — HEMOGLOBIN A1C
HEMOGLOBIN A1C: 5.6 % (ref 4.8–5.6)
MEAN PLASMA GLUCOSE: 114.02 mg/dL

## 2018-01-14 LAB — TROPONIN I
Troponin I: <0.03 ng/mL (ref <0.03)
Troponin I: <0.03 ng/mL (ref <0.03)

## 2018-01-14 LAB — LIPID PANEL
Cholesterol: 153 mg/dL (ref 0–200)
HDL: 32 mg/dL — ABNORMAL LOW (ref >40)
LDL Cholesterol: 102 mg/dL — ABNORMAL HIGH (ref 0–99)
TRIGLYCERIDES: 97 mg/dL (ref <150)
Total CHOL/HDL Ratio: 4.8 RATIO
VLDL: 19 mg/dL (ref 0–40)

## 2018-01-14 LAB — HIV ANTIBODY (ROUTINE TESTING W REFLEX): HIV Screen 4th Generation wRfx: NONREACTIVE

## 2018-01-14 MED ORDER — PROCHLORPERAZINE EDISYLATE 5 MG/ML IJ SOLN
10.0000 mg | Freq: Once | INTRAMUSCULAR | Status: AC
  Administered 2018-01-14: 10 mg via INTRAVENOUS
  Filled 2018-01-14: qty 2

## 2018-01-14 MED ORDER — KETOROLAC TROMETHAMINE 30 MG/ML IJ SOLN
30.0000 mg | Freq: Once | INTRAMUSCULAR | Status: AC
  Administered 2018-01-14: 30 mg via INTRAVENOUS
  Filled 2018-01-14: qty 1

## 2018-01-14 NOTE — Progress Notes (Signed)
OT Cancellation Note  Patient Details Name: Randall Best MRN: [id number] DOB: 1951-06-20   Cancelled Treatment:    Reason Eval/Treat Not Completed: Active bedrest order. Please update activity orders to initiate therapy evals. Thanks  [place], OT/L  [phone] 01/14/2018 01/14/2018, 9:27 AM

## 2018-01-14 NOTE — Progress Notes (Signed)
Pt off unit for MRI, discussion had with patient/daughter of the importance of taking medication to manage pain symptoms

## 2018-01-14 NOTE — Progress Notes (Signed)
EEG completed, results pending. 

## 2018-01-14 NOTE — Procedures (Signed)
ELECTROENCEPHALOGRAM REPORT  Date of Study: 01/14/2018  Patient's Name: NASHID PELLUM MRN: [id number] Date of Birth: September 21, 1951  Referring Provider: Dr. Karmen Bongo  Clinical History: This is a 67 year old man with episodes of left gaze followed by right gaze, head and upper body tremors  Medications: Cardizem Xarelto Tylenol Aspirin  Technical Summary: A multichannel digital EEG recording measured by the international 10-20 system with electrodes applied with paste and impedances below 5000 ohms performed in our laboratory with EKG monitoring in an awake and asleep patient.  Hyperventilation was not performed. Photic stimulation was performed.  The digital EEG was referentially recorded, reformatted, and digitally filtered in a variety of bipolar and referential montages for optimal display.    Description: The patient is awake and asleep during the recording.  During maximal wakefulness, there is a symmetric, medium voltage 8-9 Hz posterior dominant rhythm that attenuates with eye opening.  The record is symmetric.  During drowsiness and sleep, there is an increase in theta slowing of the background.  Vertex waves and symmetric sleep spindles were seen.  Photic stimulation did not elicit any abnormalities.  There were no epileptiform discharges or electrographic seizures seen.    EKG lead showed irregular rhythm.  Impression: This awake and asleep EEG is normal.    Clinical Correlation: A normal EEG does not exclude a clinical diagnosis of epilepsy. Clinical correlation is advised.   Ellouise Newer, M.D.

## 2018-01-14 NOTE — Progress Notes (Signed)
Patient discharged home. Discharge instructions were reviewed with the patient. Patient verbalized understanding.  

## 2018-01-14 NOTE — Progress Notes (Addendum)
Progress Note  Patient Name: Randall Best Date of Encounter: 01/14/2018  Primary Cardiologist: [name], MD   Subjective   Still with mild left-sided frontal headache and numbness at the corner of the left side of his mouth. Weakness has improved. Denies chest pain, SOB, or palpitations.  Inpatient Medications    Scheduled Meds: .  stroke: mapping our early stages of recovery book   Does not apply Once  . aspirin  300 mg Rectal Daily   Or  . aspirin  325 mg Oral Daily  . diltiazem  240 mg Oral Daily  . flecainide  50 mg Oral BID  . metoCLOPramide (REGLAN) injection  10 mg Intravenous Once  . omega-3 acid ethyl esters  1 g Oral Daily  . rivaroxaban  20 mg Oral Q supper   Continuous Infusions: . sodium chloride 50 mL/hr at 01/13/18 1943   PRN Meds: acetaminophen **OR** acetaminophen (TYLENOL) oral liquid 160 mg/5 mL **OR** acetaminophen, senna-docusate   Vital Signs    Vitals:   01/13/18 2200 01/14/18 0000 01/14/18 0454 01/14/18 1044  BP: 102/84 94/79 101/86 117/86  Pulse: 84 (!) 59 63   Resp: 18 17 18    Temp: 98.6 F (37 C) 98.4 F (36.9 C) 98.1 F (36.7 C)   TempSrc:  Oral Oral   SpO2: 97% 94% 96%   Weight:      Height:        Intake/Output Summary (Last 24 hours) at 01/14/2018 1158 Last data filed at 01/14/2018 0558 Gross per 24 hour  Intake 714.17 ml  Output 1050 ml  Net -335.83 ml   Filed Weights   01/13/18 1213  Weight: 214 lb (97.1 kg)    Telemetry    Atrial fibrillation, no RVR, occasional pauses - Personally Reviewed  Physical Exam   GEN: Laying in bed in no acute distress.   Neck: No JVD, no carotid bruits Cardiac: IRRR, no murmurs, rubs, or gallops.  Respiratory: Clear to auscultation bilaterally, no wheezes/ rales/ rhonchi GI: NABS, Soft, nontender, non-distended  MS: No edema; No deformity. Neuro:  Nonfocal, moving all extremities spontaneously Psych: Normal affect   Labs    ChemistryNo results for input(s): NA, K, CL,  CO2, GLUCOSE, BUN, CREATININE, CALCIUM, PROT, ALBUMIN, AST, ALT, ALKPHOS, BILITOT, GFRNONAA, GFRAA, ANIONGAP in the last 168 hours.   HematologyNo results for input(s): WBC, RBC, HGB, HCT, MCV, MCH, MCHC, RDW, PLT in the last 168 hours.  Cardiac Enzymes Recent Labs  Lab 01/13/18 1838 01/14/18 0017 01/14/18 0631  TROPONINI <0.03 <0.03 <0.03   No results for input(s): TROPIPOC in the last 168 hours.   BNPNo results for input(s): BNP, PROBNP in the last 168 hours.   DDimer No results for input(s): DDIMER in the last 168 hours.   Radiology    Ct Angio Head W Or Wo Contrast  Result Date: 01/13/2018 CLINICAL DATA:  Left-sided weakness. EXAM: CT ANGIOGRAPHY HEAD AND NECK TECHNIQUE: Multidetector CT imaging of the head and neck was performed using the standard protocol during bolus administration of intravenous contrast. Multiplanar CT image reconstructions and MIPs were obtained to evaluate the vascular anatomy. Carotid stenosis measurements (when applicable) are obtained utilizing NASCET criteria, using the distal internal carotid diameter as the denominator. CONTRAST:  57mL ISOVUE-370 IOPAMIDOL (ISOVUE-370) INJECTION 76% COMPARISON:  Noncontrast head CT earlier today. FINDINGS: CTA NECK FINDINGS Aortic arch: Mild atherosclerotic calcification. Three vessel branching. Right carotid system: Vessels are smooth and widely patent. No atheromatous changes. Left carotid system: Vessels are  smooth and widely patent. No atheromatous changes. Vertebral arteries: No proximal subclavian stenosis or ulceration. Mild right vertebral artery dominance. Both vertebral arteries are tortuous but patent and non beaded. Skeleton: No acute or aggressive finding Other neck: Negative Upper chest: Negative Review of the MIP images confirms the above findings CTA HEAD FINDINGS Anterior circulation: Right ICA is larger than the left in the setting of left A1 hypoplasia. Intact anterior communicating artery. No branch occlusion  or flow limiting stenosis. No noted atheromatous changes. Posterior circulation: Vertebrobasilar arteries are tortuous but smooth, patent, and nondilated. Moderate narrowing of the right mid P2 segment. This is beyond the expected location of the thalamoperforators; there is no visible thalamic infarct. Negative for aneurysm or beading. Venous sinuses: Patent Anatomic variants: As above Delayed phase: Not obtained in the emergent setting. Review of the MIP images confirms the above findings IMPRESSION: 1. No emergent large vessel occlusion. 2. No atherosclerosis or stenosis in the neck. 3. Moderate right P2 segment narrowing, nonspecific in the absence of other atherosclerotic manifestations. Electronically Signed   By: Monte Fantasia M.D.   On: 01/13/2018 15:17   Ct Angio Neck W Or Wo Contrast  Result Date: 01/13/2018 CLINICAL DATA:  Left-sided weakness. EXAM: CT ANGIOGRAPHY HEAD AND NECK TECHNIQUE: Multidetector CT imaging of the head and neck was performed using the standard protocol during bolus administration of intravenous contrast. Multiplanar CT image reconstructions and MIPs were obtained to evaluate the vascular anatomy. Carotid stenosis measurements (when applicable) are obtained utilizing NASCET criteria, using the distal internal carotid diameter as the denominator. CONTRAST:  93mL ISOVUE-370 IOPAMIDOL (ISOVUE-370) INJECTION 76% COMPARISON:  Noncontrast head CT earlier today. FINDINGS: CTA NECK FINDINGS Aortic arch: Mild atherosclerotic calcification. Three vessel branching. Right carotid system: Vessels are smooth and widely patent. No atheromatous changes. Left carotid system: Vessels are smooth and widely patent. No atheromatous changes. Vertebral arteries: No proximal subclavian stenosis or ulceration. Mild right vertebral artery dominance. Both vertebral arteries are tortuous but patent and non beaded. Skeleton: No acute or aggressive finding Other neck: Negative Upper chest: Negative Review of  the MIP images confirms the above findings CTA HEAD FINDINGS Anterior circulation: Right ICA is larger than the left in the setting of left A1 hypoplasia. Intact anterior communicating artery. No branch occlusion or flow limiting stenosis. No noted atheromatous changes. Posterior circulation: Vertebrobasilar arteries are tortuous but smooth, patent, and nondilated. Moderate narrowing of the right mid P2 segment. This is beyond the expected location of the thalamoperforators; there is no visible thalamic infarct. Negative for aneurysm or beading. Venous sinuses: Patent Anatomic variants: As above Delayed phase: Not obtained in the emergent setting. Review of the MIP images confirms the above findings IMPRESSION: 1. No emergent large vessel occlusion. 2. No atherosclerosis or stenosis in the neck. 3. Moderate right P2 segment narrowing, nonspecific in the absence of other atherosclerotic manifestations. Electronically Signed   By: Monte Fantasia M.D.   On: 01/13/2018 15:17   Mr Brain Wo Contrast  Result Date: 01/14/2018 CLINICAL DATA:  TIA, initial exam. EXAM: MRI HEAD WITHOUT CONTRAST TECHNIQUE: Multiplanar, multiecho pulse sequences of the brain and surrounding structures were obtained without intravenous contrast. COMPARISON:  Head CT and CTA from yesterday FINDINGS: Brain: No acute infarction, hemorrhage, hydrocephalus, extra-axial collection or mass lesion. Three or 4 small FLAIR hyperintensities in the cerebral white, allowable for age. Normal brain volume. Vascular: Major flow voids are preserved. Skull and upper cervical spine: No evidence of marrow lesion. Sinuses/Orbits: Negative Other: Progressively motion degraded  exam. IMPRESSION: Unremarkable exam for age.  Negative for infarct. Electronically Signed   By: Monte Fantasia M.D.   On: 01/14/2018 10:19   Ct Head Code Stroke Wo Contrast  Result Date: 01/13/2018 CLINICAL DATA:  Code stroke. 67 year old male with left side weakness. EXAM: CT HEAD  WITHOUT CONTRAST TECHNIQUE: Contiguous axial images were obtained from the base of the skull through the vertex without intravenous contrast. COMPARISON:  None. FINDINGS: Brain: Cerebral volume is within normal limits for age. No midline shift, ventriculomegaly, mass effect, evidence of mass lesion, intracranial hemorrhage or evidence of cortically based acute infarction. Gray-white matter differentiation is within normal limits throughout the brain. No cortical encephalomalacia identified. Vascular: Mild Calcified atherosclerosis at the skull base. No suspicious intracranial vascular hyperdensity. Skull: Intact. Sinuses/Orbits: Clear. Other: Visualized orbits and scalp soft tissues are within normal limits. ASPECTS [facility] Stroke Program Early CT Score) - Ganglionic level infarction (caudate, lentiform nuclei, internal capsule, insula, M1-M3 cortex): 7 - Supraganglionic infarction (M4-M6 cortex): 3 Total score (0-10 with 10 being normal): 10 IMPRESSION: 1. Normal for age noncontrast CT appearance of the brain. 2. ASPECTS is 10. 3. These results were communicated to Dr. Leonel Ramsay at 2:52 pmon 3/6/2019by text page via the [facility] messaging system. Electronically Signed   By: Genevie Ann M.D.   On: 01/13/2018 14:52    Cardiac Studies   Echocardiogram 12/22/17: Study Conclusions  - Left ventricle: The cavity size was normal. Systolic function was   normal. The estimated ejection fraction was in the range of 50%   to 55%. Wall motion was normal; there were no regional wall   motion abnormalities. The study was not technically sufficient to   allow evaluation of LV diastolic dysfunction due to atrial   fibrillation. - Aorta: Aortic root dimension: 38 mm (ED). Ascending aorta   diameter: 43 mm (ED). - Aortic root: The aortic root was mildly dilated. - Mitral valve: There was trivial regurgitation. - Left atrium: The atrium was mildly dilated. - Pulmonary arteries: Systolic pressure could not be  accurately   estimated.  TEE Cardioversion 01/13/18: Study Conclusions  - Left ventricle: The cavity size was normal. There was mild   concentric hypertrophy. Systolic function was normal. The   estimated ejection fraction was in the range of 55% to 60%. Wall   motion was normal; there were no regional wall motion   abnormalities. No evidence of thrombus. - Aortic valve: No evidence of vegetation. - Aorta: Dilated ascending aorta to 4.2 cm. - Mitral valve: There was trivial regurgitation. - Left atrium: The atrium was dilated. No evidence of thrombus in   the atrial cavity or appendage. - Right atrium: No evidence of thrombus in the atrial cavity or   appendage. - Atrial septum: No defect or patent foramen ovale was identified.  Impressions:  - Unsuccessful cardioversion. No cardiac source of emboli was   indentified.   Patient Profile     67 y.o. male with PMH of HLD and afib on Xarelto presenting for routine TEE with cardioversion on 01/13/18. Unfortunately cardioversion was unsuccessful and complicated by TIA-like symptoms post-procedure. Admitted for further work-up and observation.   Assessment & Plan    1. Atrial fibrillation: patient presented for planned TEE cardioversion 01/13/18 which was unsuccessful. Troponin negative x3 post-procedure.  - Continue home diltiazem, flecainide, and xarelto - Was on an event monitor with plans to observer response to cardioversion post-procedure. Given unsuccessful cardioversion, okay to discontinue. Patient plans to drop off device at the clinic  this week.   2. Transient neurologic symptoms: post TEE cardioversion patient had a brief episode of left sided weakness, garbled speech, and left-sided facial numbness. Also with HA which was present prior to surgery. Code stroke was activated - CT/CTA negative, MRI negative. EEG pending. Neurology consulted - possible symptoms 2/2 migraine. - Continue management per Neurology/primary team  3.  HLD: LDL 102. Allergies to statins and zetia noted - Continue fish oil - Consider referral to lipid clinic for consideration of PCSK9 inhibitor  Anticipate patient is stable for discharge from a cardiology standpoint. Should follow-up with Dr. Ellyn Hack in 1-2 weeks.   For questions or updates, please contact [place] Please consult [email or web address] for contact info under Cardiology/STEMI.      Signed, Abigail Butts, PA-C  01/14/2018, 11:58 AM   [phone]  Attending Note:   The patient was seen and examined.  Agree with assessment and plan as noted above.  Changes made to the above note as needed.  Patient seen and independently examined with Roby Lofts, PA .   We discussed all aspects of the encounter. I agree with the assessment and plan as stated above.  1.   Atrial fib the patient remains in atrial fibrillation.  He went back to the atrial for ablation after a successful cardioversion.  He had a neurologic event following the cardioversion which was originally thought to be a TIA but now is thought to be a complex migraine.  From our standpoint he is very stable.  His heart rate and blood pressure are well controlled.  I would continue with current medications.  He will follow-up with Dr. Ellyn Hack.   He is stable for DC from a  Cardiology standpoint   I have spent a total of 40 minutes with patient reviewing hospital  notes , telemetry, EKGs, labs and examining patient as well as establishing an assessment and plan that was discussed with the patient. > 50% of time was spent in direct patient care.    Thayer Headings, Brooke Bonito., MD, [facility] 01/14/2018, 1:14 PM [address],  [place] Pager [phone]

## 2018-01-14 NOTE — Discharge Summary (Addendum)
Physician Discharge Summary  SOLOMON Randall Best QPY:[id number] DOB: August 20, 1951 DOA: 01/13/2018  PCP: Starlyn Skeans, PA-C  Admit date: 01/13/2018 Discharge date: 01/14/2018  Admitted From: home  Disposition:  home   Recommendations for Outpatient Follow-up:  1. F/u with neurology as needed  Discharge Condition:  stable   CODE STATUS:  DNR   Consultations:  Neuro  Cardiology     Discharge Diagnoses:  Active Problems:   Persistent atrial fibrillation (HCC)   Hyperlipidemia   Complicated migraine    HPI: Randall Best LODES is a 67 y.o. male with medical history significant of HLD and afib on Xarelto presenting for routine TEE with cardioversion.  He had the cardioversion and went back into afib. He then developed acute onset of numbness on the left face, arm and leg.  His face felt very tight on the left.  He had some difficulty with his speech.  He thinks it was related to prolonged effects from the anesthesia.  Symptoms lasted a few minutes and resolved completely.  Denies h/o strokes.  He reports walking 10 miles and 61 flights of stairs yesterday.  He is compliant with his Xarelto.  He does currently have a right-sided frontal headache.       Hospital Course:   Left sided weakness, left facial numbness and droop- possibly complicated Migraine - being followed by Neurology - work up with CT head, CTA head and neck,  MRI and EEG unrevealing - he continues to have a headache today along with left lip numbness and very mild left sided weakness- He was treated by Neuro for a complicated Migraine with Toradol and Compazine and reports that his symptoms have resolved- Neuro does not recommend any changes in medications at this point and therefore no new prescriptions have been given  A-fib - unfortunately, cardioversion was not effective - has been seen by cardiology- recommendations are to continue Diltiazem, Flecainide and Xarelto- he will follow up as outpt   Discharge  Exam: Vitals:   01/14/18 1044 01/14/18 1231  BP: 117/86 94/69  Pulse:  (!) 56  Resp:  16  Temp:  98.4 F (36.9 C)  SpO2:  93%   Vitals:   01/14/18 0000 01/14/18 0454 01/14/18 1044 01/14/18 1231  BP: 94/79 101/86 117/86 94/69  Pulse: (!) 59 63  (!) 56  Resp: 17 18  16   Temp: 98.4 F (36.9 C) 98.1 F (36.7 C)  98.4 F (36.9 C)  TempSrc: Oral Oral  Axillary  SpO2: 94% 96%  93%  Weight:      Height:        General: Pt is alert, awake, not in acute distress Cardiovascular: RRR, S1/S2 +, no rubs, no gallops Respiratory: CTA bilaterally, no wheezing, no rhonchi Abdominal: Soft, NT, ND, bowel sounds + Extremities: no edema, no cyanosis   Discharge Instructions  Discharge Instructions    Diet - low sodium heart healthy   Complete by:  As directed    Increase activity slowly   Complete by:  As directed      Allergies as of 01/14/2018      Reactions   Ezetimibe Other (See Comments)   Broke out in sores/ muscle pain   Omeprazole Nausea And Vomiting   Statins Other (See Comments)   Muscle pains, mouth sores      Medication List    TAKE these medications   diltiazem 120 MG 24 hr capsule Commonly known as:  CARDIZEM CD Take 2 capsules (240 mg total) by mouth daily.  Fish Oil 1200 MG Caps Take 1,200 mg by mouth daily.   flecainide 50 MG tablet Commonly known as:  TAMBOCOR Take 1 tablet (50 mg total) by mouth 2 (two) times daily.   rivaroxaban 20 MG Tabs tablet Commonly known as:  XARELTO Take 1 tablet (20 mg total) by mouth daily with supper.   ULTRA MAN PO Take 1 tablet by mouth daily.   Vitamin D3 5000 units Tabs Take 1 tablet by mouth daily. Take 5,000 units dailt       Allergies  Allergen Reactions  . Ezetimibe Other (See Comments)    Broke out in sores/ muscle pain  . Omeprazole Nausea And Vomiting  . Statins Other (See Comments)    Muscle pains, mouth sores     Procedures/Studies:  EEG: This awake and asleep EEG is normal  Ct Angio Head W  Or Randall Best Contrast  Result Date: 01/13/2018 CLINICAL DATA:  Left-sided weakness. EXAM: CT ANGIOGRAPHY HEAD AND NECK TECHNIQUE: Multidetector CT imaging of the head and neck was performed using the standard protocol during bolus administration of intravenous contrast. Multiplanar CT image reconstructions and MIPs were obtained to evaluate the vascular anatomy. Carotid stenosis measurements (when applicable) are obtained utilizing NASCET criteria, using the distal internal carotid diameter as the denominator. CONTRAST:  75mL ISOVUE-370 IOPAMIDOL (ISOVUE-370) INJECTION 76% COMPARISON:  Noncontrast head CT earlier today. FINDINGS: CTA NECK FINDINGS Aortic arch: Mild atherosclerotic calcification. Three vessel branching. Right carotid system: Vessels are smooth and widely patent. No atheromatous changes. Left carotid system: Vessels are smooth and widely patent. No atheromatous changes. Vertebral arteries: No proximal subclavian stenosis or ulceration. Mild right vertebral artery dominance. Both vertebral arteries are tortuous but patent and non beaded. Skeleton: No acute or aggressive finding Other neck: Negative Upper chest: Negative Review of the MIP images confirms the above findings CTA HEAD FINDINGS Anterior circulation: Right ICA is larger than the left in the setting of left A1 hypoplasia. Intact anterior communicating artery. No branch occlusion or flow limiting stenosis. No noted atheromatous changes. Posterior circulation: Vertebrobasilar arteries are tortuous but smooth, patent, and nondilated. Moderate narrowing of the right mid P2 segment. This is beyond the expected location of the thalamoperforators; there is no visible thalamic infarct. Negative for aneurysm or beading. Venous sinuses: Patent Anatomic variants: As above Delayed phase: Not obtained in the emergent setting. Review of the MIP images confirms the above findings IMPRESSION: 1. No emergent large vessel occlusion. 2. No atherosclerosis or stenosis  in the neck. 3. Moderate right P2 segment narrowing, nonspecific in the absence of other atherosclerotic manifestations. Electronically Signed   By: Monte Fantasia M.D.   On: 01/13/2018 15:17   Ct Angio Neck W Or Randall Best Contrast  Result Date: 01/13/2018 CLINICAL DATA:  Left-sided weakness. EXAM: CT ANGIOGRAPHY HEAD AND NECK TECHNIQUE: Multidetector CT imaging of the head and neck was performed using the standard protocol during bolus administration of intravenous contrast. Multiplanar CT image reconstructions and MIPs were obtained to evaluate the vascular anatomy. Carotid stenosis measurements (when applicable) are obtained utilizing NASCET criteria, using the distal internal carotid diameter as the denominator. CONTRAST:  54mL ISOVUE-370 IOPAMIDOL (ISOVUE-370) INJECTION 76% COMPARISON:  Noncontrast head CT earlier today. FINDINGS: CTA NECK FINDINGS Aortic arch: Mild atherosclerotic calcification. Three vessel branching. Right carotid system: Vessels are smooth and widely patent. No atheromatous changes. Left carotid system: Vessels are smooth and widely patent. No atheromatous changes. Vertebral arteries: No proximal subclavian stenosis or ulceration. Mild right vertebral artery dominance. Both vertebral arteries  are tortuous but patent and non beaded. Skeleton: No acute or aggressive finding Other neck: Negative Upper chest: Negative Review of the MIP images confirms the above findings CTA HEAD FINDINGS Anterior circulation: Right ICA is larger than the left in the setting of left A1 hypoplasia. Intact anterior communicating artery. No branch occlusion or flow limiting stenosis. No noted atheromatous changes. Posterior circulation: Vertebrobasilar arteries are tortuous but smooth, patent, and nondilated. Moderate narrowing of the right mid P2 segment. This is beyond the expected location of the thalamoperforators; there is no visible thalamic infarct. Negative for aneurysm or beading. Venous sinuses: Patent  Anatomic variants: As above Delayed phase: Not obtained in the emergent setting. Review of the MIP images confirms the above findings IMPRESSION: 1. No emergent large vessel occlusion. 2. No atherosclerosis or stenosis in the neck. 3. Moderate right P2 segment narrowing, nonspecific in the absence of other atherosclerotic manifestations. Electronically Signed   By: Monte Fantasia M.D.   On: 01/13/2018 15:17   Mr Brain Randall Best Contrast  Result Date: 01/14/2018 CLINICAL DATA:  TIA, initial exam. EXAM: MRI HEAD WITHOUT CONTRAST TECHNIQUE: Multiplanar, multiecho pulse sequences of the brain and surrounding structures were obtained without intravenous contrast. COMPARISON:  Head CT and CTA from yesterday FINDINGS: Brain: No acute infarction, hemorrhage, hydrocephalus, extra-axial collection or mass lesion. Three or 4 small FLAIR hyperintensities in the cerebral white, allowable for age. Normal brain volume. Vascular: Major flow voids are preserved. Skull and upper cervical spine: No evidence of marrow lesion. Sinuses/Orbits: Negative Other: Progressively motion degraded exam. IMPRESSION: Unremarkable exam for age.  Negative for infarct. Electronically Signed   By: Monte Fantasia M.D.   On: 01/14/2018 10:19   Ct Head Code Stroke Randall Best Contrast  Result Date: 01/13/2018 CLINICAL DATA:  Code stroke. 67 year old male with left side weakness. EXAM: CT HEAD WITHOUT CONTRAST TECHNIQUE: Contiguous axial images were obtained from the base of the skull through the vertex without intravenous contrast. COMPARISON:  None. FINDINGS: Brain: Cerebral volume is within normal limits for age. No midline shift, ventriculomegaly, mass effect, evidence of mass lesion, intracranial hemorrhage or evidence of cortically based acute infarction. Gray-white matter differentiation is within normal limits throughout the brain. No cortical encephalomalacia identified. Vascular: Mild Calcified atherosclerosis at the skull base. No suspicious  intracranial vascular hyperdensity. Skull: Intact. Sinuses/Orbits: Clear. Other: Visualized orbits and scalp soft tissues are within normal limits. ASPECTS [facility] Stroke Program Early CT Score) - Ganglionic level infarction (caudate, lentiform nuclei, internal capsule, insula, M1-M3 cortex): 7 - Supraganglionic infarction (M4-M6 cortex): 3 Total score (0-10 with 10 being normal): 10 IMPRESSION: 1. Normal for age noncontrast CT appearance of the brain. 2. ASPECTS is 10. 3. These results were communicated to Dr. Leonel Ramsay at 2:52 pmon 3/6/2019by text page via the [facility] messaging system. Electronically Signed   By: Genevie Ann M.D.   On: 01/13/2018 14:52     The results of significant diagnostics from this hospitalization (including imaging, microbiology, ancillary and laboratory) are listed below for reference.     Microbiology: No results found for this or any previous visit (from the past 240 hour(s)).   Labs: BNP (last 3 results) No results for input(s): BNP in the last 8760 hours. Basic Metabolic Panel: No results for input(s): NA, K, CL, CO2, GLUCOSE, BUN, CREATININE, CALCIUM, MG, PHOS in the last 168 hours. Liver Function Tests: No results for input(s): AST, ALT, ALKPHOS, BILITOT, PROT, ALBUMIN in the last 168 hours. No results for input(s): LIPASE, AMYLASE in the last 168  hours. No results for input(s): AMMONIA in the last 168 hours. CBC: No results for input(s): WBC, NEUTROABS, HGB, HCT, MCV, PLT in the last 168 hours. Cardiac Enzymes: Recent Labs  Lab 01/13/18 1838 01/14/18 0017 01/14/18 0631  TROPONINI <0.03 <0.03 <0.03   BNP: Invalid input(s): POCBNP CBG: Recent Labs  Lab 01/13/18 1530  GLUCAP 72   D-Dimer No results for input(s): DDIMER in the last 72 hours. Hgb A1c Recent Labs    01/14/18 0017  HGBA1C 5.6   Lipid Profile Recent Labs    01/14/18 0631  CHOL 153  HDL 32*  LDLCALC 102*  TRIG 97  CHOLHDL 4.8   Thyroid function studies No results for  input(s): TSH, T4TOTAL, T3FREE, THYROIDAB in the last 72 hours.  Invalid input(s): FREET3 Anemia work up No results for input(s): VITAMINB12, FOLATE, FERRITIN, TIBC, IRON, RETICCTPCT in the last 72 hours. Urinalysis    Component Value Date/Time   COLORURINE YELLOW 08/05/2017 0218   APPEARANCEUR CLEAR 08/05/2017 0218   LABSPEC 1.028 08/05/2017 0218   PHURINE 5.0 08/05/2017 0218   GLUCOSEU NEGATIVE 08/05/2017 0218   HGBUR NEGATIVE 08/05/2017 0218   BILIRUBINUR NEGATIVE 08/05/2017 0218   KETONESUR NEGATIVE 08/05/2017 0218   PROTEINUR NEGATIVE 08/05/2017 0218   NITRITE NEGATIVE 08/05/2017 0218   LEUKOCYTESUR NEGATIVE 08/05/2017 0218   Sepsis Labs Invalid input(s): PROCALCITONIN,  WBC,  LACTICIDVEN Microbiology No results found for this or any previous visit (from the past 240 hour(s)).   Time coordinating discharge: Over 30 minutes  SIGNED:   Debbe Odea, MD  [facility] 01/14/2018, 4:16 PM Pager   If 7PM-7AM, please contact night-coverage [email or web address] Password [email or web address]

## 2018-01-14 NOTE — Care Management Note (Signed)
Case Management Note  Patient Details  Name: Randall Best MRN: [id number] Date of Birth: November 14, 1950  Subjective/Objective:      Pt admitted with TIA after TEE/DCCV. He is from home with his spouse.              Action/Plan: Awaiting PT/OT evals. CM following for d/c needs, physician orders.  Expected Discharge Date:                  Expected Discharge Plan:     In-House Referral:     Discharge planning Services     Post Acute Care Choice:    Choice offered to:     DME Arranged:    DME Agency:     HH Arranged:    HH Agency:     Status of Service:  In process, will continue to follow  If discussed at Long Length of Stay Meetings, dates discussed:    Additional Comments:  Pollie Friar, RN 01/14/2018, 12:15 PM

## 2018-01-14 NOTE — Progress Notes (Signed)
Subjective: Still complains of mild left facial numbness  Exam: Vitals:   01/14/18 1044 01/14/18 1231  BP: 117/86 94/69  Pulse:  (!) 56  Resp:  16  Temp:  98.4 F (36.9 C)  SpO2:  93%   Gen: In bed, NAD Resp: non-labored breathing, no acute distress Abd: soft, nt  Neuro: MS: Awake, alert, interactive and appropriate CN: Pupils equal round and reactive, visual fields full, he does not raise his left face is much when smiling, also does not puff it out as much Motor: He has mild 4+/5 strength of left arm and leg, 5/5 in the right Sensory: Diminished in the left  Pertinent Labs: LDL 102  Impression: 67 year old male with abnormal neurological deficits following TEE.  With a history of diplopia and vertigo after migraines in the past, I suspect that this represents persistent migraine aura.  With a negative MRI and EEG, I think that other etiology is much less likely.  Conversion disorder remains a possibility, but with a history, I think migraine is more likely.  I think that TIA/stroke is much less likely, without any signs of atherosclerotic disease and in the setting of his TEE even if this was TIA I would not favor atherosclerosis as an etiology and do not think that aggressive statin therapy would be needed.  Anticoagulation would be the treatment which is already being undertaken with Xarelto.  Recommendations: 1) Compazine/Toradol x1 for possible migraine 2) no further recommendations following this, if he continues to have problems could consider PT  Roland Rack, MD [facility] [phone]  If 7pm- 7am, please page neurology on call as listed in [place].

## 2018-02-01 ENCOUNTER — Telehealth: Payer: Self-pay | Admitting: Cardiology

## 2018-02-01 NOTE — Telephone Encounter (Signed)
Spoke with patient and reviewed medications. Advised to continue same dose of medications until follow up in April. Patient should have refills at [facility], he will call and refill.

## 2018-02-01 NOTE — Telephone Encounter (Signed)
°  Pt c/o medication issue:  1. Name of Medication: Diltiazem 120 mg//2x days  and Flecainide 50 mg// 2x days  2. How are you currently taking this medication (dosage and times per day)?   3. Are you having a reaction (difficulty breathing--STAT)? No   4. What is your medication issue? Patient would like to verify if he needs to continue taking these medications. Patient states that if he is not available to please leave a detailed message.

## 2018-02-02 ENCOUNTER — Ambulatory Visit: Payer: Medicare Other | Admitting: Cardiology

## 2018-02-03 DIAGNOSIS — R21 Rash and other nonspecific skin eruption: Secondary | ICD-10-CM | POA: Diagnosis not present

## 2018-02-03 DIAGNOSIS — L97511 Non-pressure chronic ulcer of other part of right foot limited to breakdown of skin: Secondary | ICD-10-CM | POA: Diagnosis not present

## 2018-02-03 DIAGNOSIS — R609 Edema, unspecified: Secondary | ICD-10-CM | POA: Diagnosis not present

## 2018-02-04 ENCOUNTER — Telehealth: Payer: Self-pay | Admitting: Cardiology

## 2018-02-04 NOTE — Telephone Encounter (Signed)
Randall Best is calling because he had a rash and his feet were swollen real bad . Went to his PCP and she gave him a Cortizone shot and some cream . He is taking Diltiazem, Felecinide , Xarelto and Furosemide , when he went to the pharmacy on yesterday the pharmacist told him that there is a high risk of internal bleeding by taking all of those medications together. He is concerned and is wanting to speak with a nurse about it . Please call   Thanks

## 2018-02-04 NOTE — Telephone Encounter (Signed)
Patient saw PCP yesterday for whelps, itching, and legs swelling.  Only on legs from knee to ankle. Did have some itching on his hands, no whelps. Rx for Furosemide 20 mg daily and Betamethasone 0.5 % cream given and cortisone injection in office. He has noticed much improvement. He was told by PCP could be related to medications he is taking. Also when he went to get Rx's from pharmacy he was told he was at high risk for internal bleeding secondary to his medications and he is concerned. Patient is having darker stools last few days but they are not black, he will continue to monitor. Will forward to Dr Ellyn Hack for review. Patient has follow up 03/08/18.

## 2018-02-07 NOTE — Telephone Encounter (Signed)
Does not likely result of her allergic reaction.  Probably not related to diltiazem or Xarelto.  Will review with our pharmacy team as to whether or not this is a common reaction seen with flecainide.  As for the high risk for internal bleeding.  This is a complete overreaction and unfair overstatement from his drugstore pharmacist.  Either 1 of the anticoagulation medications would potentially have him at risk for internal bleeding as we discussed when I talked to him about starting Xarelto.  Since he has been noticing some darker stools we can check a CBC to see that he is stable from a hemoglobin standpoint.  I have yet to see him back since his brief hospitalization following attempted cardioversion.  He should be seen in close follow-up, if not by me potentially in the A. fib clinic to discuss further treatment.  [name], MD

## 2018-02-10 NOTE — Telephone Encounter (Signed)
Spoke to patient rash has improved,  Aware to continue  Medication  And keep appointment on 03/08/18

## 2018-02-11 DIAGNOSIS — L97511 Non-pressure chronic ulcer of other part of right foot limited to breakdown of skin: Secondary | ICD-10-CM | POA: Diagnosis not present

## 2018-02-11 DIAGNOSIS — R609 Edema, unspecified: Secondary | ICD-10-CM | POA: Diagnosis not present

## 2018-02-15 ENCOUNTER — Ambulatory Visit (INDEPENDENT_AMBULATORY_CARE_PROVIDER_SITE_OTHER): Payer: BLUE CROSS/BLUE SHIELD | Admitting: Podiatry

## 2018-02-15 ENCOUNTER — Ambulatory Visit (INDEPENDENT_AMBULATORY_CARE_PROVIDER_SITE_OTHER): Payer: BLUE CROSS/BLUE SHIELD

## 2018-02-15 ENCOUNTER — Encounter: Payer: Self-pay | Admitting: Podiatry

## 2018-02-15 DIAGNOSIS — L97519 Non-pressure chronic ulcer of other part of right foot with unspecified severity: Secondary | ICD-10-CM | POA: Diagnosis not present

## 2018-02-15 DIAGNOSIS — L97512 Non-pressure chronic ulcer of other part of right foot with fat layer exposed: Secondary | ICD-10-CM

## 2018-02-15 DIAGNOSIS — I83015 Varicose veins of right lower extremity with ulcer other part of foot: Secondary | ICD-10-CM | POA: Diagnosis not present

## 2018-02-15 DIAGNOSIS — L309 Dermatitis, unspecified: Secondary | ICD-10-CM | POA: Diagnosis not present

## 2018-02-15 MED ORDER — GENTAMICIN SULFATE 0.1 % EX CREA: 1.0000 "application " | TOPICAL_CREAM | Freq: Three times a day (TID) | CUTANEOUS | 1 refills | Status: DC

## 2018-02-19 DIAGNOSIS — R945 Abnormal results of liver function studies: Secondary | ICD-10-CM | POA: Diagnosis not present

## 2018-02-19 DIAGNOSIS — L97512 Non-pressure chronic ulcer of other part of right foot with fat layer exposed: Secondary | ICD-10-CM | POA: Diagnosis not present

## 2018-02-22 NOTE — Progress Notes (Signed)
   Subjective:  67 year old male presents the office today for evaluation of an ulcer to the right great toe.  The patient states that he is not diabetic.  He has had this ulcer for the past month.  He states that now his toe is red and swollen.  He has been applying iodine with sugar on the toe.  Currently he is taking doxycycline 100 mg 2 times daily.  He presents for further treatment and evaluation.    Past Medical History:  Diagnosis Date  . Asthma    as child  . Atrial fibrillation, rapid ([place]) 12/08/2017   Diagnosed during colonoscopy -presumably new onset  . GERD (gastroesophageal reflux disease)    use meds prn  . Glaucoma   . Headache    remote h/o migraines, none in years  . Hyperlipidemia   . TIA (transient ischemic attack) 01/13/2018  . Ventral hernia      Objective/Physical Exam General: The patient is alert and oriented x3 in no acute distress.  Dermatology:  Wound #1 noted to the right great toe measuring 0.7 x 0.7 x 0.1 cm (LxWxD).   To the noted ulceration(s), there is no eschar. There is a moderate amount of slough, fibrin, and necrotic tissue noted. Granulation tissue and wound base is red. There is a minimal amount of serosanguineous drainage noted. There is no exposed bone muscle-tendon ligament or joint. There is no malodor. Periwound integrity is intact. Skin is warm, dry and supple bilateral lower extremities.  Vascular: Palpable pedal pulses bilaterally. Mild edema noted.  There is also some erythema noted to the right great toe which is localized and does not extend to the foot.  Capillary refill within normal limits. Varicosities noted bilateral lower extremities.   Neurological: Epicritic and protective threshold absent bilaterally.   Musculoskeletal Exam: Range of motion within normal limits to all pedal and ankle joints bilateral. Muscle strength 5/5 in all groups bilateral.   Assessment: #1  Ulcer right great toe secondary to venous insufficiency #2  varicosities bilateral lower extremities  Plan of Care:  #1 Patient was evaluated. #2 medically necessary excisional debridement including subcutaneous tissue was performed using a tissue nipper and a chisel blade. Excisional debridement of all the necrotic nonviable tissue down to healthy bleeding viable tissue was performed with post-debridement measurements same as pre-. #3 the wound was cleansed with normal saline.  Dry sterile dressing applied #4  Prescription for gentamicin cream to be applied with a Band-Aid daily.. #4  Recommend Epsom salt soaks daily #5 return to clinic in 2 weeks  *Patient walks 12-15 miles per day for heart health. Pt has A-fib.    Edrick Kins, DPM [facility]  Dr. Edrick Kins, [place]                                        [place], [place] [address]                Office [phone]  Fax [phone]

## 2018-02-25 ENCOUNTER — Other Ambulatory Visit: Payer: Self-pay

## 2018-02-25 MED ORDER — DILTIAZEM HCL ER COATED BEADS 120 MG PO CP24: 240.0000 mg | ORAL_CAPSULE | Freq: Every day | ORAL | 5 refills | Status: DC

## 2018-03-01 ENCOUNTER — Ambulatory Visit: Payer: BLUE CROSS/BLUE SHIELD | Admitting: Podiatry

## 2018-03-08 ENCOUNTER — Encounter: Payer: Self-pay | Admitting: Cardiology

## 2018-03-08 ENCOUNTER — Ambulatory Visit (INDEPENDENT_AMBULATORY_CARE_PROVIDER_SITE_OTHER): Payer: BLUE CROSS/BLUE SHIELD | Admitting: Cardiology

## 2018-03-08 VITALS — BP 124/82 | HR 78 | Ht 70.0 in | Wt 205.8 lb

## 2018-03-08 DIAGNOSIS — R0609 Other forms of dyspnea: Secondary | ICD-10-CM

## 2018-03-08 DIAGNOSIS — I481 Persistent atrial fibrillation: Secondary | ICD-10-CM

## 2018-03-08 DIAGNOSIS — R079 Chest pain, unspecified: Secondary | ICD-10-CM | POA: Diagnosis not present

## 2018-03-08 DIAGNOSIS — I4819 Other persistent atrial fibrillation: Secondary | ICD-10-CM

## 2018-03-08 DIAGNOSIS — I872 Venous insufficiency (chronic) (peripheral): Secondary | ICD-10-CM | POA: Insufficient documentation

## 2018-03-08 DIAGNOSIS — E782 Mixed hyperlipidemia: Secondary | ICD-10-CM

## 2018-03-08 MED ORDER — DILTIAZEM HCL ER COATED BEADS 240 MG PO CP24: 240.0000 mg | ORAL_CAPSULE | Freq: Every day | ORAL | 3 refills | Status: DC

## 2018-03-08 MED ORDER — RIVAROXABAN 20 MG PO TABS: 20.0000 mg | ORAL_TABLET | Freq: Every day | ORAL | 3 refills | Status: DC

## 2018-03-08 NOTE — Assessment & Plan Note (Signed)
Pretty much proving to be persistent/permanent A. fib.  Probably been going on lot longer than we thought.  He probably has been long-standing. He is not overly symptomatic and therefore I am reluctant to really push antiarrhythmic agents or attempt another cardioversion (especially given his most recent episode. Plan: We can stop flecainide for now since it seems to not been successful in cardioverting him. Continue anticoagulation despite having low score. Continue diltiazem, but will switch dose to evening which may help him sleep.

## 2018-03-08 NOTE — Patient Instructions (Addendum)
MEDICATION INSTRUCTIONS  STOP FLECAINIDE  START DILTIAZEM CD 240 MG  ONE TABLET DAILY    KEEP WEARING SUPPORT SOCKS , KNEE HI  KEEP FEET ELEVATED WHEN SITTING.     Your physician wants you to follow-up in [place].You will receive a reminder letter in the mail two months in advance. If you don't receive a letter, please call our office to schedule the follow-up appointment.   If you need a refill on your cardiac medications before your next appointment, please call your pharmacy.

## 2018-03-08 NOTE — Progress Notes (Signed)
PCP: Starlyn Skeans, PA-C  Clinic Note: Chief Complaint  Patient presents with  . Follow-up    pt complaints of slight chest pain, HR is higher and drops down espically at night, swelling in right leg, slight SOB    HPI: Randall Best is a 67 y.o. male who is being seen today for follow-up evaluation of persistent atrial fibrillation.  He was originally seen on December 09, 2017 at the request of Starlyn Skeans, [place].  His gastroenterologist Dr. Scarlette Shorts when he noted atrial fibrillation during colonoscopy procedure.  Randall Best was last seen on January 06, 2018.  He had a pretty significant A. fib burden noted on monitor so we felt it prudent to go ahead and discuss TEE cardioversion and potential initiation of antiarrhythmic agent.  He was noticed some exertional dyspnea and mild orthopnea.  His heart rate responsiveness did improve with increased dose of diltiazem.  No bleeding issues on Xarelto.  Recent Hospitalizations:   TEE-DCCV 01/13/18: Post cardioversion he developed acute onset left-sided facial numbness concerning with possible stroke.  He was observed overnight. -->  MRI negative for CVA  Seen by podiatry for right great toe ulcer.  Thought to be related to venous insufficiency.  Studies Personally Reviewed - (if available, images/films reviewed: From Epic Chart or Care Everywhere)  TEE-DCCV 01/13/18: Unfortunately DCCV not successful.  EF 55 to 60% with no regional wall motion Abnormality.  No left atrial appendage.  Interval History: [name] presents today for follow-up overall indicating that he actually feels pretty good.  He does not really notice his heart beating irregular at baseline, but if he does try to exert himself he will feel a little bit short of breath.  Would really notes from his discharge in the hospital that he started feeling tired.  The other thing he is noted is some swelling more in the right leg versus the left --> seen by podiatry  for right great toe ulcer.  What he often notes is that he does not sleep well.  He wakes up several times during the course of the night, not because of shortness of breath or dizziness or dyspnea, just has a hard time falling asleep.  He then also notes that he sometimes feels a little bit dizzy when he wakes up. He has a little residual left-sided facial numbness from his hospitalization with possible TIA versus migraine.  Otherwise no recurrent TIA or amaurosis fugax symptoms.  No melena, hematochezia, hematuria. He has not had any chest tightness pressure with rest or exertion.  No exertional dyspnea.  No PND, orthopnea and only unilateral edema.   ROS: A comprehensive was performed. Review of Systems  Constitutional: Positive for malaise/fatigue (Improved with increased diltiazem dose).  HENT: Negative for congestion and sinus pain.   Respiratory: Positive for shortness of breath.   Cardiovascular: Positive for leg swelling (A little bit more in the right leg than the left.).       Per HPI  Gastrointestinal: Negative for abdominal pain, nausea and vomiting.       His GI symptoms see him to have resolved  Musculoskeletal: Negative for joint pain.       He has noted some mild numbness and discomfort in his left arm that comes and goes.  Is been going on for a while  Neurological: Positive for dizziness (Occasionally when he sits up quickly.). Negative for headaches.  Psychiatric/Behavioral: Negative for memory loss. The patient is not nervous/anxious and does not have  insomnia.   All other systems reviewed and are negative.  I have reviewed and (if needed) personally updated the patient's problem list, medications, allergies, past medical and surgical history, social and family history.   Past Medical History:  Diagnosis Date  . Asthma    as child  . Atrial fibrillation, rapid ([place]) 12/08/2017   Diagnosed during colonoscopy -presumably new onset  . GERD (gastroesophageal reflux  disease)    use meds prn  . Glaucoma   . Headache    remote h/o migraines, none in years  . Hyperlipidemia   . TIA (transient ischemic attack) 01/13/2018  . Ventral hernia     Past Surgical History:  Procedure Laterality Date  . CARDIAC EVENT MONITOR  12/2016   showed persistent Afib -monitor return in early because of persistent A. fib.  Several episodes of severe RVR with rates in the 170s were noted.   [name] CARDIOVERSION N/A 01/13/2018   Procedure: CARDIOVERSION;  Surgeon: Pixie Casino, MD;  Location: [facility];  Service: Cardiovascular;  Laterality: N/A; unsuccessful.-->  Post-cardioversion, the patient had left-sided facial numbness and weakness.  Ruled out for stroke.  . INSERTION OF MESH N/A 07/22/2017   Procedure: INSERTION OF MESH;  Surgeon: [name], MD;  Location: [place];  Service: General;  Laterality: N/A;  BILATERAL TAP BLOCK  . NM MYOVIEW LTD  12/2017   Shows reduced EF, but no evidence of ischemia or infarction.  EF 40 and 45%.  INTERMEDIATE RISK due to reduced function.  EF notably better on echo (50 and 55%)  . TEE WITHOUT CARDIOVERSION N/A 01/13/2018   Procedure: TRANSESOPHAGEAL ECHOCARDIOGRAM (TEE) with cardioversion - ;  Surgeon: Pixie Casino, MD;  Location[facility];  Service: Cardiovascular;  Laterality: N/A;  . TONSILLECTOMY    . TRANSTHORACIC ECHOCARDIOGRAM  12/2017    EF 50 and 55%.  No regional wall motion normalities.  [name] VENTRAL HERNIA REPAIR N/A 07/22/2017   Procedure: OPEN VENTRAL HERNIA REPAIR WITH MESH ERAS PATHWAY;  Surgeon: [name], MD;  Location: [place];  Service: General;  Laterality: N/A;  BILATERAL TAP BLOCK    Current Meds  Medication Sig  . augmented betamethasone dipropionate (DIPROLENE-AF) 0.05 % cream APPLY TO AFFECTED AREA EVERY DAY FOR 10 DAYS  . Cholecalciferol (VITAMIN D3) 5000 units TABS Take 1 tablet by mouth daily. Take 5,000 units dailt   . furosemide (LASIX) 20 MG tablet Take 20 mg by mouth daily.  [name]  gentamicin cream (GARAMYCIN) 0.1 % Apply 1 application topically 3 (three) times daily.  . Omega-3 Fatty Acids (FISH OIL) 1200 MG CAPS Take 1,200 mg by mouth daily.  . rivaroxaban (XARELTO) 20 MG TABS tablet Take 1 tablet (20 mg total) by mouth daily with supper.  [name] Specialty Vitamins Products (ULTRA MAN PO) Take 1 tablet by mouth daily.   . [DISCONTINUED] diltiazem (CARDIZEM CD) 120 MG 24 hr capsule Take 2 capsules (240 mg total) by mouth daily.  . [DISCONTINUED] flecainide (TAMBOCOR) 50 MG tablet Take 1 tablet (50 mg total) by mouth 2 (two) times daily.  . [DISCONTINUED] rivaroxaban (XARELTO) 20 MG TABS tablet Take 1 tablet (20 mg total) by mouth daily with supper.    Allergies  Allergen Reactions  . Ezetimibe Other (See Comments)    Broke out in sores/ muscle pain  . Omeprazole Nausea And Vomiting  . Statins Other (See Comments)    Muscle pains, mouth sores    Social History   Tobacco Use  . Smoking status:  Never Smoker  . Smokeless tobacco: Never Used  Substance Use Topics  . Alcohol use: Yes    Comment: Rarely  . Drug use: No   Social History   Social History Narrative   He is a married father of 95, grandfather of 1.  Lives with his wife.   He does exercise regularly most days of the week for about 30 minutes.  He walks about 1-3 miles a day.  He will occasionally also walk on the treadmill.  He notes his energy is better if he remains active.    family history includes CVA in his maternal grandmother; Diabetes in his mother; Heart failure in his father; Hypertension in his brother.  Wt Readings from Last 3 Encounters:  03/08/18 205 lb 12.8 oz (93.4 kg)  01/13/18 214 lb (97.1 kg)  01/06/18 214 lb 9.6 oz (97.3 kg)    PHYSICAL EXAM BP 124/82 (BP Location: Left Arm)   Pulse 78   Ht 5\' 10"  (1.778 m)   Wt 205 lb 12.8 oz (93.4 kg)   BMI 29.53 kg/m  Physical Exam  Constitutional: He is oriented to person, place, and time. He appears well-developed and well-nourished.  No distress.  Healthy-appearing.  Well-groomed  HENT:  Head: Normocephalic and atraumatic.  Neck: Neck supple. No hepatojugular reflux and no JVD present. Carotid bruit is not present.  Cardiovascular: Normal rate, normal heart sounds and normal pulses. An irregularly irregular rhythm present. PMI is not displaced. Exam reveals no gallop.  No murmur heard. Pulmonary/Chest: Effort normal and breath sounds normal. No respiratory distress. He has no wheezes. He has no rales.  Abdominal: Soft. Bowel sounds are normal. He exhibits no distension. There is no tenderness.  Musculoskeletal: Normal range of motion. He exhibits no edema.  Neurological: He is alert and oriented to person, place, and time. No cranial nerve deficit.  Skin: Skin is warm and dry.  On the plantar surface of the right great toe there is a roughly half centimeter diameter ulcer of the appears to be healing, but slowly.  No erythema.  It actually has the appearance of a callus or plantars wart  Psychiatric: He has a normal mood and affect. His behavior is normal. Judgment and thought content normal.  Nursing note and vitals reviewed.    Adult ECG Report A. fib, rate 70 bpm.  Nonspecific ST-T wave changes.  No change   Other studies Reviewed: Additional studies/ records that were reviewed today include:  Recent Labs:   Lab Results  Component Value Date   CREATININE 0.96 01/06/2018   BUN 15 01/06/2018   NA 142 01/06/2018   K 4.4 01/06/2018   CL 103 01/06/2018   CO2 24 01/06/2018   Lab Results  Component Value Date   CHOL 153 01/14/2018   HDL 32 (L) 01/14/2018   LDLCALC 102 (H) 01/14/2018   TRIG 97 01/14/2018   CHOLHDL 4.8 01/14/2018    ASSESSMENT / PLAN: Problem List Items Addressed This Visit    Venous insufficiency of right lower extremity > Left (Chronic)    Diagnosed by podiatry with his foot ulcer.  I suspect that this is probably exacerbated by A. fib.  Unusual to do the right versus the left.  If it  persists, would consider venous duplex Dopplers, but for now would recommend foot elevation, support stockings and low-dose as needed Lasix.      Relevant Medications   diltiazem (CARDIZEM CD) 240 MG 24 hr capsule   rivaroxaban (XARELTO) 20 MG  TABS tablet   Persistent atrial fibrillation ([place]): CHA2DS2-VASc Score 1 - Primary (Chronic)    Pretty much proving to be persistent/permanent A. fib.  Probably been going on lot longer than we thought.  He probably has been long-standing. He is not overly symptomatic and therefore I am reluctant to really push antiarrhythmic agents or attempt another cardioversion (especially given his most recent episode. Plan: We can stop flecainide for now since it seems to not been successful in cardioverting him. Continue anticoagulation despite having low score. Continue diltiazem, but will switch dose to evening which may help him sleep.      Relevant Medications   diltiazem (CARDIZEM CD) 240 MG 24 hr capsule   rivaroxaban (XARELTO) 20 MG TABS tablet   Hyperlipidemia (Chronic)   Relevant Medications   diltiazem (CARDIZEM CD) 240 MG 24 hr capsule   rivaroxaban (XARELTO) 20 MG TABS tablet   Exertional dyspnea (Chronic)    This is probably is related to his A. fib, but he is now starting to get some more used to it.  Unfortunately, I think we may have to settle with rate control as opposed to rhythm control unless we were to go to a more potent antiarrhythmics.  He would prefer not to.  I doubt that Multaq would be any more effective than flecainide. Otherwise for now would simply monitor, and if exacerbated to the point of heart failure, may consider EP referral for possible ablation      Chest pain with low risk for cardiac etiology (Chronic)    Negative Myoview.  Not really having chest pain.  Unlikely to be ACS related. Is symptomatic with RVR. --Better with higher dose of diltiazem.>          Current medicines are reviewed at length with the patient  today. (+/- concerns) concerned about may be having to be on blood thinners The following changes have been made:See below   Patient Instructions  MEDICATION INSTRUCTIONS  STOP FLECAINIDE  START DILTIAZEM CD 240 MG  ONE TABLET DAILY    KEEP WEARING SUPPORT SOCKS , KNEE HI  KEEP FEET ELEVATED WHEN SITTING.     Your physician wants you to follow-up in [place].You will receive a reminder letter in the mail two months in advance. If you don't receive a letter, please call our office to schedule the follow-up appointment.   If you need a refill on your cardiac medications before your next appointment, please call your pharmacy.    Studies Ordered:   No orders of the defined types were placed in this encounter.     [name], M.D., M.S. Interventional Cardiologist   Pager # [phone] Phone # [phone] [address] [place], [place] [address]   Thank you for choosing Heartcare at [facility]!!

## 2018-03-08 NOTE — Assessment & Plan Note (Signed)
This is probably is related to his A. fib, but he is now starting to get some more used to it.  Unfortunately, I think we may have to settle with rate control as opposed to rhythm control unless we were to go to a more potent antiarrhythmics.  He would prefer not to.  I doubt that Multaq would be any more effective than flecainide. Otherwise for now would simply monitor, and if exacerbated to the point of heart failure, may consider EP referral for possible ablation

## 2018-03-08 NOTE — Assessment & Plan Note (Signed)
Negative Myoview.  Not really having chest pain.  Unlikely to be ACS related. Is symptomatic with RVR. --Better with higher dose of diltiazem.>

## 2018-03-08 NOTE — Assessment & Plan Note (Signed)
Diagnosed by podiatry with his foot ulcer.  I suspect that this is probably exacerbated by A. fib.  Unusual to do the right versus the left.  If it persists, would consider venous duplex Dopplers, but for now would recommend foot elevation, support stockings and low-dose as needed Lasix.

## 2018-05-29 DIAGNOSIS — H43812 Vitreous degeneration, left eye: Secondary | ICD-10-CM | POA: Diagnosis not present

## 2018-07-02 DIAGNOSIS — Z0000 Encounter for general adult medical examination without abnormal findings: Secondary | ICD-10-CM | POA: Diagnosis not present

## 2018-07-02 DIAGNOSIS — N400 Enlarged prostate without lower urinary tract symptoms: Secondary | ICD-10-CM | POA: Diagnosis not present

## 2018-07-02 DIAGNOSIS — E782 Mixed hyperlipidemia: Secondary | ICD-10-CM | POA: Diagnosis not present

## 2018-08-09 DIAGNOSIS — Z23 Encounter for immunization: Secondary | ICD-10-CM | POA: Diagnosis not present

## 2018-09-08 ENCOUNTER — Encounter: Payer: Self-pay | Admitting: Cardiology

## 2018-09-08 ENCOUNTER — Ambulatory Visit (INDEPENDENT_AMBULATORY_CARE_PROVIDER_SITE_OTHER): Payer: BLUE CROSS/BLUE SHIELD | Admitting: Cardiology

## 2018-09-08 VITALS — BP 116/62 | HR 76 | Ht 69.0 in | Wt 219.2 lb

## 2018-09-08 DIAGNOSIS — E782 Mixed hyperlipidemia: Secondary | ICD-10-CM | POA: Diagnosis not present

## 2018-09-08 DIAGNOSIS — R0609 Other forms of dyspnea: Secondary | ICD-10-CM

## 2018-09-08 DIAGNOSIS — R5383 Other fatigue: Secondary | ICD-10-CM

## 2018-09-08 DIAGNOSIS — R079 Chest pain, unspecified: Secondary | ICD-10-CM | POA: Diagnosis not present

## 2018-09-08 DIAGNOSIS — I4819 Other persistent atrial fibrillation: Secondary | ICD-10-CM

## 2018-09-08 DIAGNOSIS — I872 Venous insufficiency (chronic) (peripheral): Secondary | ICD-10-CM

## 2018-09-08 MED ORDER — FUROSEMIDE 20 MG PO TABS: ORAL_TABLET | ORAL | 3 refills | Status: DC

## 2018-09-08 NOTE — Patient Instructions (Addendum)
Medication Instructions:  CONTINUE WITH CURRENT MEDICATIONS  [name]  DAILY IF YOU HAVE INCREASE SWELLING. If you need a refill on your cardiac medications before your next appointment, please call your pharmacy.   Lab work: Not needed If you have labs (blood work) drawn today and your tests are completely normal, you will receive your results only by: [name] MyChart Message (if you have MyChart) OR . A paper copy in the mail If you have any lab test that is abnormal or we need to change your treatment, we will call you to review the results.  Testing/Procedures: THINK ABOUT WHETHER YOU WOULD LIKE TO HAVE AFIB ABLATION CONTACT OFFICE  Follow-Up: At [facility], you and your health needs are our priority.  As part of our continuing mission to provide you with exceptional heart care, we have created designated Provider Care Teams.  These Care Teams include your primary Cardiologist (physician) and Advanced Practice Providers (APPs -  Physician Assistants and Nurse Practitioners) who all work together to provide you with the care you need, when you need it. You will need a follow up appointment in 6 months.  Please call our office 2 months in advance to schedule this appointment.  You may see [name], MD or one of the following Advanced Practice Providers on your designated Care Team:   Rosaria Ferries, PA-C . Jory Sims, DNP, ANP  Any Other Special Instructions Will Be Listed Below (If Applicable).  [place]

## 2018-09-08 NOTE — Progress Notes (Signed)
PCP: Camille Bal, PA-C  Clinic Note: Chief Complaint  Patient presents with  . Follow-up  . Edema    RIGHT FOOT AND ANKLE  . Atrial Fibrillation    Persistent/chronic    HPI: Randall Best is a 67 y.o. male with a PMH of Persistent/PAF who presents today for 6 month f/u. Marland KitchenORRIE Best was last seen on April 29 - Hospital f/u for AFib & ? TIA (was actually his 2nd visit - initally seen in Jan for Afib noted during colonoscopy).  TEE/DCCV on 01/13/18 - unsuccessful.  He noted a tired feeling with Afib. Difficulty sleepoing. Diltiazem dose increased. D/c Flecainide.  Planned rate control. Xarelto for [place].  Recent Hospitalizations: none  Studies Personally Reviewed - (if available, images/films reviewed: From Epic Chart or Care Everywhere)  TEE DCCV on January 13, 2018 --> unsuccessful cardioversion. Mild concentric LVH.  EF 55 to 60%.  No R WMA.  Mild ascending aortic dilation of 4.2 cm.  Dilated left atrium.  No thrombus.  Interval History: Randall Best returns today overall feeling quite well from a cardiac standpoint.  He really does not notice being in A. fib at night.  Has not had any rapid heart rate breakthrough spells.  Has not had any bleeding issues with Xarelto.  The only complaint he has is the right leg swelling and tender.  He is got some other musculoskeletal complaints but nothing a cardiac standpoint.  He is not symptomatic with his A. fib, but it just bothers him having it.  He is wanting to know how he can come off some medications. His weight has been much better controlled with the increased dose of diltiazem.  No chest pain or pressure with rest or exertion.  No exertional dyspnea unless he overdoes it.  No PND, orthopnea.  No syncope/near syncope or TIA/amaurosis fugax.  No claudication.  ROS: A comprehensive was performed. Review of Systems  Constitutional: Negative for malaise/fatigue and weight loss (He actually gained some weight in the last 6 months.   ).  HENT: Negative for congestion and nosebleeds.   Cardiovascular: Positive for leg swelling (Mostly right foot lower leg and ankle.).  Musculoskeletal: Positive for joint pain. Negative for back pain.       The swollen foot and ankle is somewhat tender, but not hot/warm. He has a right shoulder fatty "mass" roughly half dollar diameter.  Neurological: Positive for dizziness (Occasionally when he stands up).  Psychiatric/Behavioral: Negative for depression. The patient is not nervous/anxious.   All other systems reviewed and are negative.  I have reviewed and (if needed) personally updated the patient's problem list, medications, allergies, past medical and surgical history, social and family history.   Past Medical History:  Diagnosis Date  . Asthma    as child  . Atrial fibrillation, rapid ([place]) 12/08/2017   Diagnosed during colonoscopy -presumably new onset  . GERD (gastroesophageal reflux disease)    use meds prn  . Glaucoma   . Headache    remote h/o migraines, none in years  . Hyperlipidemia   . TIA (transient ischemic attack) 01/13/2018  . Ventral hernia     Past Surgical History:  Procedure Laterality Date  . CARDIAC EVENT MONITOR  12/2016   showed persistent Afib -monitor return in early because of persistent A. fib.  Several episodes of severe RVR with rates in the 170s were noted.   [name] CARDIOVERSION N/A 01/13/2018   Procedure: CARDIOVERSION;  Surgeon: Pixie Casino, MD;  Location:  [facility];  Service: Cardiovascular;  Laterality: N/A; unsuccessful.-->  Post-cardioversion, the patient had left-sided facial numbness and weakness.  Ruled out for stroke.  . INSERTION OF MESH N/A 07/22/2017   Procedure: INSERTION OF MESH;  Surgeon: [name], MD;  Location: [place];  Service: General;  Laterality: N/A;  BILATERAL TAP BLOCK  . NM MYOVIEW LTD  12/2017   Shows reduced EF, but no evidence of ischemia or infarction.  EF 40 and 45%.  INTERMEDIATE RISK due to reduced  function.  EF notably better on echo (50 and 55%)  . TEE WITHOUT CARDIOVERSION N/A 01/13/2018   Procedure: TRANSESOPHAGEAL ECHOCARDIOGRAM (TEE) with cardioversion - ;  Surgeon: Pixie Casino, MD;  Location[facility] ENDOSCOPY;  Service: Cardiovascular;  Laterality: N/A;  Mild concentric LVH.  EF 55 to 60%.  No R WMA.  Mild ascending aortic dilation of 4.2 cm.  Dilated left atrium.  No thrombus.  . TONSILLECTOMY    . TRANSTHORACIC ECHOCARDIOGRAM  12/2017    EF 50 and 55%.  No regional wall motion normalities.  [name] VENTRAL HERNIA REPAIR N/A 07/22/2017   Procedure: OPEN VENTRAL HERNIA REPAIR WITH MESH ERAS PATHWAY;  Surgeon: [name], MD;  Location: [place];  Service: General;  Laterality: N/A;  BILATERAL TAP BLOCK    Current Meds  Medication Sig  . Chlorpheniramine Maleate (ALLERGY PO) Take by mouth.  . Cholecalciferol (VITAMIN D3) 5000 units TABS Take 1 tablet by mouth daily. Take 5,000 units dailt   . rivaroxaban (XARELTO) 20 MG TABS tablet Take 1 tablet (20 mg total) by mouth daily with supper.  [name] Specialty Vitamins Products (ULTRA MAN PO) Take 1 tablet by mouth daily.   . [DISCONTINUED] furosemide (LASIX) 20 MG tablet Take 20 mg by mouth daily.    Allergies  Allergen Reactions  . Ezetimibe Other (See Comments)    Broke out in sores/ muscle pain  . Omeprazole Nausea And Vomiting  . Statins Other (See Comments)    Muscle pains, mouth sores    Social History   Tobacco Use  . Smoking status: Never Smoker  . Smokeless tobacco: Never Used  Substance Use Topics  . Alcohol use: Yes    Comment: Rarely  . Drug use: No   Social History   Social History Narrative   He is a married father of 46, grandfather of 1.  Lives with his wife.   He does exercise regularly most days of the week for about 30 minutes.  He walks about 1-3 miles a day.  He will occasionally also walk on the treadmill.  He notes his energy is better if he remains active.    family history includes CVA in his  maternal grandmother; Diabetes in his mother; Heart failure in his father; Hypertension in his brother.  Wt Readings from Last 3 Encounters:  09/08/18 219 lb 3.2 oz (99.4 kg)  03/08/18 205 lb 12.8 oz (93.4 kg)  01/13/18 214 lb (97.1 kg)    PHYSICAL EXAM BP 116/62   Pulse 76   Ht 5\' 9"  (1.753 m)   Wt 219 lb 3.2 oz (99.4 kg)   BMI 32.37 kg/m  Physical Exam  Constitutional: He is oriented to person, place, and time. He appears well-developed and well-nourished. No distress.  Healthy appearing  HENT:  Head: Normocephalic and atraumatic.  Neck: Normal range of motion. Neck supple. No JVD present.  Cardiovascular: Normal rate, normal heart sounds and intact distal pulses. An irregularly irregular rhythm present. PMI is not displaced.  Exam reveals no gallop and no friction rub.  No murmur heard. Pulmonary/Chest: Effort normal and breath sounds normal. No respiratory distress. He has no wheezes. He has no rales.  Abdominal: Soft. Bowel sounds are normal. He exhibits no distension. There is no tenderness. There is no rebound.  Musculoskeletal: Normal range of motion. He exhibits no edema.  Neurological: He is alert and oriented to person, place, and time.  Psychiatric: He has a normal mood and affect. His behavior is normal. Judgment and thought content normal.  Vitals reviewed.     Adult ECG Report  Rate: 76;  Rhythm: atrial fibrillation and Rightward axis deviation.  Nonspecific T wave inversions in lateral leads.  Otherwise normal voltage, intervals and durations.;   Narrative Interpretation: Stable EKG.   Other studies Reviewed: Additional studies/ records that were reviewed today include:  Recent Labs:    Lab Results  Component Value Date   CHOL 153 01/14/2018   HDL 32 (L) 01/14/2018   LDLCALC 102 (H) 01/14/2018   TRIG 97 01/14/2018   CHOLHDL 4.8 01/14/2018    K PN 07/02/2018: TC 223, TG 183, LDL 151, HDL 46.   ASSESSMENT / PLAN: Problem List Items Addressed This Visit     Chest pain with low risk for cardiac etiology (Chronic)    No further episodes of chest pain.  Negative Myoview.      Relevant Orders   EKG 12-Lead (Completed)   Exertional dyspnea (Chronic)    His dyspnea is better now that his rate is better controlled.  Probably related to smoking.  He had quit and then he went back to smoking.  I think that is probably the problem.  Discussed importance of smoking sensation.      Fatigue (Chronic)    Doing better with diltiazem and then beta-blocker.      Hyperlipidemia (Chronic)   Relevant Medications   furosemide (LASIX) 20 MG tablet   Persistent atrial fibrillation ([place]): CHA2DS2-VASc Score 2 - Primary (Chronic)    Persistent A. fib.  Unable to convert.  He wants to think about referral for possible ablation.  At this point he is not that concerned.  Rate control with diltiazem, anticoagulation with Xarelto.  No bleeding issues.  He will get back to assess whether or not he would like to be referred for possible A. fib ablation.  For now he still wants to stay as it is.      Relevant Medications   furosemide (LASIX) 20 MG tablet   Other Relevant Orders   EKG 12-Lead (Completed)   Venous insufficiency of right lower extremity > Left (Chronic)    I think his swelling is probably related to venous insufficiency and not necessarily help with the diltiazem.  Diltiazem seem to work better from a energy level however.  Discussed importance of using support stockings. Would prefer not to use too much Lasix, but he is taking 20 mg and okay to take additional dose here and there.      Relevant Medications   furosemide (LASIX) 20 MG tablet     This patients CHA2DS2-VASc Score and unadjusted Ischemic Stroke Rate (% per year) is equal to 2.2 % stroke rate/year from a score of 2  Above score calculated as 1 point each if present [CHF, HTN, DM, Vascular=MI/PAD/Aortic Plaque, Age if 65-74, or Male] Above score calculated as 2 points each if  present [Age > 75, or Stroke/TIA/TE]    Current medicines are reviewed at length with the patient today.  (+/-  concerns) none The following changes have been made:  Okay to use additional furosemide  Patient Instructions  Medication Instructions:  CONTINUE WITH CURRENT MEDICATIONS  [name]  DAILY IF YOU HAVE INCREASE SWELLING. If you need a refill on your cardiac medications before your next appointment, please call your pharmacy.   Lab work: Not needed If you have labs (blood work) drawn today and your tests are completely normal, you will receive your results only by: [name] MyChart Message (if you have MyChart) OR . A paper copy in the mail If you have any lab test that is abnormal or we need to change your treatment, we will call you to review the results.  Testing/Procedures: THINK ABOUT WHETHER YOU WOULD LIKE TO HAVE AFIB ABLATION CONTACT OFFICE  Follow-Up: At [facility], you and your health needs are our priority.  As part of our continuing mission to provide you with exceptional heart care, we have created designated Provider Care Teams.  These Care Teams include your primary Cardiologist (physician) and Advanced Practice Providers (APPs -  Physician Assistants and Nurse Practitioners) who all work together to provide you with the care you need, when you need it. You will need a follow up appointment in 6 months.  Please call our office 2 months in advance to schedule this appointment.  You may see [name], MD or one of the following Advanced Practice Providers on your designated Care Team:   Rosaria Ferries, PA-C . Jory Sims, DNP, ANP  Any Other Special Instructions Will Be Listed Below (If Applicable).  CONTINUE WEAR SUPPORT SOCKS/HOSE     Studies Ordered:   Orders Placed This Encounter  Procedures  . EKG 12-Lead      [name], M.D., M.S. Interventional Cardiologist   Pager # [phone] Phone #  [phone] [address] [place], [place] [address]   Thank you for choosing Heartcare at [facility]!!

## 2018-09-09 ENCOUNTER — Encounter: Payer: Self-pay | Admitting: Cardiology

## 2018-09-09 NOTE — Assessment & Plan Note (Signed)
Persistent A. fib.  Unable to convert.  He wants to think about referral for possible ablation.  At this point he is not that concerned.  Rate control with diltiazem, anticoagulation with Xarelto.  No bleeding issues.  He will get back to assess whether or not he would like to be referred for possible A. fib ablation.  For now he still wants to stay as it is.

## 2018-09-09 NOTE — Assessment & Plan Note (Signed)
His dyspnea is better now that his rate is better controlled.  Probably related to smoking.  He had quit and then he went back to smoking.  I think that is probably the problem.  Discussed importance of smoking sensation.

## 2018-09-09 NOTE — Assessment & Plan Note (Signed)
I think his swelling is probably related to venous insufficiency and not necessarily help with the diltiazem.  Diltiazem seem to work better from a energy level however.  Discussed importance of using support stockings. Would prefer not to use too much Lasix, but he is taking 20 mg and okay to take additional dose here and there.

## 2018-09-09 NOTE — Assessment & Plan Note (Signed)
Doing better with diltiazem and then beta-blocker.

## 2018-09-09 NOTE — Assessment & Plan Note (Signed)
No further episodes of chest pain.  Negative Myoview.

## 2018-10-01 DIAGNOSIS — D1721 Benign lipomatous neoplasm of skin and subcutaneous tissue of right arm: Secondary | ICD-10-CM | POA: Diagnosis not present

## 2018-10-01 DIAGNOSIS — D485 Neoplasm of uncertain behavior of skin: Secondary | ICD-10-CM | POA: Diagnosis not present

## 2018-10-01 DIAGNOSIS — L738 Other specified follicular disorders: Secondary | ICD-10-CM | POA: Diagnosis not present

## 2018-10-01 DIAGNOSIS — L918 Other hypertrophic disorders of the skin: Secondary | ICD-10-CM | POA: Diagnosis not present

## 2018-10-01 DIAGNOSIS — D235 Other benign neoplasm of skin of trunk: Secondary | ICD-10-CM | POA: Diagnosis not present

## 2018-10-01 DIAGNOSIS — L820 Inflamed seborrheic keratosis: Secondary | ICD-10-CM | POA: Diagnosis not present

## 2019-01-03 DIAGNOSIS — L570 Actinic keratosis: Secondary | ICD-10-CM | POA: Diagnosis not present

## 2019-01-03 DIAGNOSIS — L821 Other seborrheic keratosis: Secondary | ICD-10-CM | POA: Diagnosis not present

## 2019-01-03 DIAGNOSIS — D1801 Hemangioma of skin and subcutaneous tissue: Secondary | ICD-10-CM | POA: Diagnosis not present

## 2019-01-03 DIAGNOSIS — L820 Inflamed seborrheic keratosis: Secondary | ICD-10-CM | POA: Diagnosis not present

## 2019-01-03 DIAGNOSIS — D225 Melanocytic nevi of trunk: Secondary | ICD-10-CM | POA: Diagnosis not present

## 2019-01-03 DIAGNOSIS — L814 Other melanin hyperpigmentation: Secondary | ICD-10-CM | POA: Diagnosis not present

## 2019-01-12 ENCOUNTER — Telehealth: Payer: Self-pay | Admitting: Cardiology

## 2019-01-12 NOTE — Telephone Encounter (Signed)
Spoke to patient. Noticing  bright red blood stool  Yesterday . Also it occured this morning  He states he has hemorrhoid not sure if that is the issue. Patient states he halso contact primary but has return call RN informed patient is correct to contact primary about the bleeding , but will contact patient about whether to hold xarelto   forward to doctor and cvrr

## 2019-01-12 NOTE — Telephone Encounter (Signed)
New Message   Patient is calling because he has been passing blood in his stool and its not stopping. He is not sure if its a result of his medication or not.

## 2019-01-12 NOTE — Telephone Encounter (Signed)
Spoke to pharmacist-  Information given to her about patient . Per pharmacist, hold Xarelto  For tonight . If blood is only with bowel movement.  RN spoke to patient - informed patient to hold xarelto dose tonight  Per pharmacist ,and contact primary tomorrow or [facility].  patient states primaryy called back and informed him to go to the er . Patient states the bleeding is not that bad he will wait to recall them in the morning.

## 2019-01-17 NOTE — Telephone Encounter (Signed)
LMOM asking patient to call back and confirm that bleeding issue has resolved.

## 2019-03-03 ENCOUNTER — Telehealth: Payer: Self-pay

## 2019-03-03 NOTE — Telephone Encounter (Signed)
Left message for patient to call office back to change office visit to virtual visit due to covid 19.

## 2019-03-03 NOTE — Telephone Encounter (Signed)
Patient returning call, please call back.

## 2019-03-03 NOTE — Telephone Encounter (Signed)
Virtual Visit Pre-Appointment Phone Call  "(Name), I am calling you today to discuss your upcoming appointment. We are currently trying to limit exposure to the virus that causes COVID-19 by seeing patients at home rather than in the office."  1. "What is the BEST phone number to call the day of the visit?" [phone]  2. "Do you have or have access to (through a family member/friend) a smartphone with video capability that we can use for your visit?" a. If yes - list this number in appt notes as "cell" (if different from BEST phone #) and list the appointment type as a VIDEO visit in appointment note  3. Confirm consent - "In the setting of the current Covid19 crisis, you are scheduled for a ( video) visit with your provider on (April 27 ) at (9:20am).  Just as we do with many in-office visits, in order for you to participate in this visit, we must obtain consent.  If you'd like, I can send this to your mychart (if signed up) or email for you to review.  Otherwise, I can obtain your verbal consent now.  All virtual visits are billed to your insurance company just like a normal visit would be.  By agreeing to a virtual visit, we'd like you to understand that the technology does not allow for your provider to perform an examination, and thus may limit your provider's ability to fully assess your condition. If your provider identifies any concerns that need to be evaluated in person, we will make arrangements to do so.  Finally, though the technology is pretty good, we cannot assure that it will always work on either your or our end, and in the setting of a video visit, we may have to convert it to a phone-only visit.  In either situation, we cannot ensure that we have a secure connection.  Are you willing to proceed?" STAFF: Did the patient verbally acknowledge consent to telehealth visit? Document YES/NO here: Yes  4. Advise patient to be prepared - "Two hours prior to your appointment, go ahead  and check your blood pressure, pulse, oxygen saturation, and your weight (if you have the equipment to check those) and write them all down. When your visit starts, your provider will ask you for this information. If you have an Apple Watch or Kardia device, please plan to have heart rate information ready on the day of your appointment. Please have a pen and paper handy nearby the day of the visit as well."  5. Give patient instructions for MyChart download to smartphone OR Doximity/[email or web address] as below if video visit (depending on what platform provider is using)  6. Inform patient they will receive a phone call 15 minutes prior to their appointment time (may be from unknown caller ID) so they should be prepared to answer    TELEPHONE CALL NOTE  Randall Best has been deemed a candidate for a follow-up tele-health visit to limit community exposure during the Covid-19 pandemic. I spoke with the patient via phone to ensure availability of phone/video source, confirm preferred email & phone number, and discuss instructions and expectations.  I reminded [name] to be prepared with any vital sign and/or heart rhythm information that could potentially be obtained via home monitoring, at the time of his visit. I reminded MAKARI PORTMAN to expect a phone call prior to his visit.  Rhetta Mura, [place] 03/03/2019 2:03 PM   INSTRUCTIONS FOR DOWNLOADING THE MYCHART APP TO  SMARTPHONE  - The patient must first make sure to have activated MyChart and know their login information - If Apple, go to [employer] and type in MyChart in the search bar and download the app. If Android, ask patient to go to [employer] and type in [place] in the search bar and download the app. The app is free but as with any other app downloads, their phone may require them to verify saved payment information or Apple/Android password.  - The patient will need to then log into the app with their MyChart username and  password, and select [facility] as their healthcare provider to link the account. When it is time for your visit, go to the MyChart app, find appointments, and click Begin Video Visit. Be sure to Select Allow for your device to access the Microphone and Camera for your visit. You will then be connected, and your provider will be with you shortly.  **If they have any issues connecting, or need assistance please contact MyChart service desk [phone])**  **If using a computer, in order to ensure the best quality for their visit they will need to use either of the following Internet Browsers: [employer], or Google Chrome**  IF USING DOXIMITY or DOXY.ME - The patient will receive a link just prior to their visit by text.     FULL LENGTH CONSENT FOR TELE-HEALTH VISIT   I hereby voluntarily request, consent and authorize [facility] and its employed or contracted physicians, physician assistants, nurse practitioners or other licensed health care professionals (the Practitioner), to provide me with telemedicine health care services (the "Services") as deemed necessary by the treating Practitioner. I acknowledge and consent to receive the Services by the Practitioner via telemedicine. I understand that the telemedicine visit will involve communicating with the Practitioner through live audiovisual communication technology and the disclosure of certain medical information by electronic transmission. I acknowledge that I have been given the opportunity to request an in-person assessment or other available alternative prior to the telemedicine visit and am voluntarily participating in the telemedicine visit.  I understand that I have the right to withhold or withdraw my consent to the use of telemedicine in the course of my care at any time, without affecting my right to future care or treatment, and that the Practitioner or I may terminate the telemedicine visit at any time. I  understand that I have the right to inspect all information obtained and/or recorded in the course of the telemedicine visit and may receive copies of available information for a reasonable fee.  I understand that some of the potential risks of receiving the Services via telemedicine include:  [name] Delay or interruption in medical evaluation due to technological equipment failure or disruption; . Information transmitted may not be sufficient (e.g. poor resolution of images) to allow for appropriate medical decision making by the Practitioner; and/or  . In rare instances, security protocols could fail, causing a breach of personal health information.  Furthermore, I acknowledge that it is my responsibility to provide information about my medical history, conditions and care that is complete and accurate to the best of my ability. I acknowledge that Practitioner's advice, recommendations, and/or decision may be based on factors not within their control, such as incomplete or inaccurate data provided by me or distortions of diagnostic images or specimens that may result from electronic transmissions. I understand that the practice of medicine is not an exact science and that Practitioner makes no warranties or  guarantees regarding treatment outcomes. I acknowledge that I will receive a copy of this consent concurrently upon execution via email to the email address I last provided but may also request a printed copy by calling the office of [facility].    I understand that my insurance will be billed for this visit.   I have read or had this consent read to me. . I understand the contents of this consent, which adequately explains the benefits and risks of the Services being provided via telemedicine.  . I have been provided ample opportunity to ask questions regarding this consent and the Services and have had my questions answered to my satisfaction. . I give my informed consent for the services to be  provided through the use of telemedicine in my medical care  By participating in this telemedicine visit I agree to the above.

## 2019-03-04 ENCOUNTER — Telehealth: Payer: Self-pay | Admitting: Cardiology

## 2019-03-04 NOTE — Telephone Encounter (Signed)
smartphone [phone] consent/ my chart via text/ pre reg completed

## 2019-03-07 ENCOUNTER — Encounter: Payer: Self-pay | Admitting: Cardiology

## 2019-03-07 ENCOUNTER — Telehealth (INDEPENDENT_AMBULATORY_CARE_PROVIDER_SITE_OTHER): Payer: BLUE CROSS/BLUE SHIELD | Admitting: Cardiology

## 2019-03-07 VITALS — BP 124/84 | HR 67 | Ht 71.0 in | Wt 216.0 lb

## 2019-03-07 DIAGNOSIS — R062 Wheezing: Secondary | ICD-10-CM

## 2019-03-07 DIAGNOSIS — I4819 Other persistent atrial fibrillation: Secondary | ICD-10-CM | POA: Diagnosis not present

## 2019-03-07 DIAGNOSIS — R0601 Orthopnea: Secondary | ICD-10-CM

## 2019-03-07 MED ORDER — DILTIAZEM HCL ER COATED BEADS 120 MG PO CP24: 120.0000 mg | ORAL_CAPSULE | Freq: Every day | ORAL | 3 refills | Status: DC

## 2019-03-07 MED ORDER — DILTIAZEM HCL 60 MG PO TABS: ORAL_TABLET | ORAL | 6 refills | Status: DC

## 2019-03-07 MED ORDER — ALBUTEROL SULFATE HFA 108 (90 BASE) MCG/ACT IN AERS: 1.0000 | INHALATION_SPRAY | Freq: Four times a day (QID) | RESPIRATORY_TRACT | 6 refills | Status: DC | PRN

## 2019-03-07 NOTE — Assessment & Plan Note (Signed)
Intermittent episodes of orthopnea which was brought with concern, but not every day.  Start ablating this fully on the heart failure, however I do think we can have him adjust his Lasix as after an episode of "smothering "he can take an additional 20 mg of Lasix.  Need to get him back on the diltiazem, but on lower dose.  If symptoms continue after 2 months, would probably want to reevaluate echocardiogram and possible chest x-ray.

## 2019-03-07 NOTE — Assessment & Plan Note (Signed)
Probably he is in A. fib most of the time.  Still not ready to consider ablation at this time.  Significant edema with higher dose of diltiazem.  My plan will be to go back to 120 mg diltiazem and then use PRN 60 mg short acting diltiazem for breakthrough episodes of tachycardia.  I have been reluctant to use beta-blocker because of his history of asthma and some fatigue.  Remains on Xarelto for anticoagulation.  Had one episode of hemorrhoidal bleeding, but otherwise has been doing fine.

## 2019-03-07 NOTE — Progress Notes (Signed)
Virtual Visit via Video Note   This visit type was conducted due to national recommendations for restrictions regarding the COVID-19 Pandemic (e.g. social distancing) in an effort to limit this patient's exposure and mitigate transmission in our community.  Due to his co-morbid illnesses, this patient is at least at moderate risk for complications without adequate follow up.  This format is felt to be most appropriate for this patient at this time.  All issues noted in this document were discussed and addressed.  A limited physical exam was performed with this format.  Please refer to the patient's chart for his consent to telehealth for [facility].   Patient has given verbal permission to conduct this visit via virtual appointment and to bill insurance 03/03/2019 2:10 PM     Evaluation Performed:  Follow-up visit  Date:  03/07/2019   ID:  NAYIB REMER, DOB [birth year], MRN [id number]  Patient Location: Home Provider Location: Home  PCP:  Camille Bal, PA-C  Cardiologist:  [name], MD  Electrophysiologist:  None   Chief Complaint: 43-month follow-up, A. fib  History of Present Illness:    NORVAL SLAVEN is a 68 y.o. male with PMH notable for persistent paroxysmal A. fib (failed cardioversion in March 2019) who presents via Engineer, civil (consulting) for a telehealth visit today --as 46-month follow-up.  DENVIL CANNING was last seen in October 2019.  He was doing well at that time.  Did not seem to be noticing A. fib or any rapid breakthrough episodes.  No bleeding on Xarelto. Noted some swelling in his right foot. --Was rate controlled on diltiazem, anticoagulated with Xarelto. --Was considering asking for referral for ablation.  Interval History:  For the most part Yitzchok Carriger is been doing pretty well over the last 6 months, but for about 2 or 3 months he has been having intermittent spells of feeling where he feels a sensation of "smothering".  This usually  occurs in the morning or sometimes at night and not every day.  He has been dealing with a lot of allergy type symptoms and has noted some dry hacking cough and some wheezing.  He has noted some orthopnea during this timeframe, but again also not every day.  He recently stopped taking his diltiazem because of significant edema that is soon as he stopped taking his diltiazem with swelling went away.  He is wondering if he needs to be on a lower dose.  He really does not feel his heart rate going that fast, but yesterday his heart rate was may be a little bit fast when he was checked but he did not feel it.  Does not feel the sensation of rapid irregular heartbeats palpitations.  1 episode of feeling some tightness in his chest that occurred at night about a week ago, but is otherwise not had any symptoms of exertional chest pain or pressure.  Cardiovascular ROS: positive for - dyspnea on exertion, edema, irregular heartbeat, orthopnea and last week had brief episode of chest tightness negative for - palpitations, paroxysmal nocturnal dyspnea or syncope, near syncope; TIA/amaruosis fugax. Melena, (1 episode of hematochezia - ? hemorrhoid), no hematuria, epistaxis  The patient does have symptoms concerning for COVID-19 infection (fever, chills, cough, or new shortness of breath).  The patient is practicing social distancing.  Neighbors help with getting groceries.  ROS:  Please see the history of present illness.    Review of Systems  Constitutional: Negative for chills, fever, malaise/fatigue and weight loss.  HENT: Positive for congestion.   Respiratory: Positive for cough (dry hacking), shortness of breath (intermittent "smothering" - in AM & PM - not once he gets going.) and wheezing.   Cardiovascular: Positive for leg swelling (better since stopping Diltiazem).  Musculoskeletal: Positive for joint pain (mild).  Neurological: Negative for dizziness, focal weakness and weakness.    Endo/Heme/Allergies: Positive for environmental allergies.  Psychiatric/Behavioral: Negative.   All other systems reviewed and are negative.   Past Medical History:  Diagnosis Date   Asthma    as child   Atrial fibrillation, rapid ([place]) 12/08/2017   Diagnosed during colonoscopy -presumably new onset   GERD (gastroesophageal reflux disease)    use meds prn   Glaucoma    Headache    remote h/o migraines, none in years   Hyperlipidemia    TIA (transient ischemic attack) 01/13/2018   Ventral hernia    Past Surgical History:  Procedure Laterality Date   CARDIAC EVENT MONITOR  12/2016   showed persistent Afib -monitor return in early because of persistent A. fib.  Several episodes of severe RVR with rates in the 170s were noted.    CARDIOVERSION N/A 01/13/2018   Procedure: CARDIOVERSION;  Surgeon: Pixie Casino, MD;  Location: [facility] ENDOSCOPY;  Service: Cardiovascular;  Laterality: N/A; unsuccessful.-->  Post-cardioversion, the patient had left-sided facial numbness and weakness.  Ruled out for stroke.   INSERTION OF MESH N/A 07/22/2017   Procedure: INSERTION OF MESH;  Surgeon: [name], MD;  Location: [facility];  Service: General;  Laterality: N/A;  BILATERAL TAP BLOCK   NM MYOVIEW LTD  12/2017   Shows reduced EF, but no evidence of ischemia or infarction.  EF 40 and 45%.  INTERMEDIATE RISK due to reduced function.  EF notably better on echo (50 and 55%)   TEE WITHOUT CARDIOVERSION N/A 01/13/2018   Procedure: TRANSESOPHAGEAL ECHOCARDIOGRAM (TEE) with cardioversion - ;  Surgeon: Pixie Casino, MD;  Location: [facility] ENDOSCOPY;  Service: Cardiovascular;  Laterality: N/A;  Mild concentric LVH.  EF 55 to 60%.  No R WMA.  Mild ascending aortic dilation of 4.2 cm.  Dilated left atrium.  No thrombus.   TONSILLECTOMY     TRANSTHORACIC ECHOCARDIOGRAM  12/2017    EF 50 and 55%.  No regional wall motion normalities.   VENTRAL HERNIA REPAIR N/A 07/22/2017   Procedure: OPEN VENTRAL  HERNIA REPAIR WITH MESH ERAS PATHWAY;  Surgeon: [name], MD;  Location: [place];  Service: General;  Laterality: N/A;  BILATERAL TAP BLOCK     Current Meds  Medication Sig   acetaminophen (TYLENOL) 325 MG tablet Take 650 mg by mouth every 6 (six) hours as needed.   Cholecalciferol (VITAMIN D3) 5000 units TABS Take 1 tablet by mouth daily. Take 5,000 units dailt    furosemide (LASIX) 20 MG tablet Take 20 mg  By mouth daily , may take an additional 20 mg tablet if needed for swelling daily   Omega-3 Fatty Acids (FISH OIL) 1200 MG CAPS Take 1,200 mg by mouth daily.   rivaroxaban (XARELTO) 20 MG TABS tablet Take 1 tablet (20 mg total) by mouth daily with supper.   Specialty Vitamins Products (ULTRA MAN PO) Take 1 tablet by mouth daily.    - stopped taking diltiazem -- swelling not controlled by Furosemide (taking 20 mg - 40 mg alternating).  Allergies:   Ezetimibe; Omeprazole; and Statins   Social History   Tobacco Use   Smoking status: Never Smoker   Smokeless tobacco:  Never Used  Substance Use Topics   Alcohol use: Yes    Comment: Rarely   Drug use: No     Family Hx: The patient's family history includes CVA in his maternal grandmother; Diabetes in his mother; Heart failure in his father; Hypertension in his brother. There is no history of Colon cancer, Esophageal cancer, Rectal cancer, or Stomach cancer.   Prior CV studies:   The following studies were reviewed today:  No new  Labs/Other Tests and Data Reviewed:    EKG:  No ECG reviewed.  Recent Labs: No results found for requested labs within last 8760 hours.   Recent Lipid Panel Lab Results  Component Value Date/Time   CHOL 153 01/14/2018 06:31 AM   TRIG 97 01/14/2018 06:31 AM   HDL 32 (L) 01/14/2018 06:31 AM   CHOLHDL 4.8 01/14/2018 06:31 AM   LDLCALC 102 (H) 01/14/2018 06:31 AM    Wt Readings from Last 3 Encounters:  03/07/19 216 lb (98 kg)  09/08/18 219 lb 3.2 oz (99.4 kg)  03/08/18 205 lb  12.8 oz (93.4 kg)     Objective:    Vital Signs:  BP 124/84    Pulse 67    Ht 5\' 11"  (1.803 m)    Wt 216 lb (98 kg)    BMI 30.13 kg/m   VITAL SIGNS:  reviewed GEN:  healthy appearing, NAD RESPIRATORY:  Non-labored, no overt wheezing. Seems stable NEURO:  alert and oriented x 3, no obvious focal deficit PSYCH:  normal affect Pt notes irregular pulse (was high yesterday)  ASSESSMENT & PLAN:    Problem List Items Addressed This Visit    Orthopnea    Intermittent episodes of orthopnea which was brought with concern, but not every day.  Start ablating this fully on the heart failure, however I do think we can have him adjust his Lasix as after an episode of "smothering "he can take an additional 20 mg of Lasix.  Need to get him back on the diltiazem, but on lower dose.  If symptoms continue after 2 months, would probably want to reevaluate echocardiogram and possible chest x-ray.      Persistent atrial fibrillation ([place]): CHA2DS2-VASc Score 2 - Primary (Chronic)    Probably he is in A. fib most of the time.  Still not ready to consider ablation at this time.  Significant edema with higher dose of diltiazem.  My plan will be to go back to 120 mg diltiazem and then use PRN 60 mg short acting diltiazem for breakthrough episodes of tachycardia.  I have been reluctant to use beta-blocker because of his history of asthma and some fatigue.  Remains on Xarelto for anticoagulation.  Had one episode of hemorrhoidal bleeding, but otherwise has been doing fine.      Relevant Medications   diltiazem (CARDIZEM CD) 120 MG 24 hr capsule   diltiazem (CARDIZEM) 60 MG tablet   Wheezing on inspiration    This may be related to seasonal allergies with lots of pollen, but could also be related to some mild cardiac asthma.  He has a history of childhood asthma.  We will prescribe albuterol as well as additional PRN Lasix.         COVID-19 Education: The signs and symptoms of COVID-19 were  discussed with the patient and how to seek care for testing (follow up with PCP or arrange E-visit).   The importance of social distancing was discussed today.  Time:   Today, I have spent  25 minutes with the patient with telehealth technology discussing the above problems.     Medication Adjustments/Labs and Tests Ordered: Current medicines are reviewed at length with the patient today.  Concerns regarding medicines are outlined above.  Medication Instructions:  Stop Diltiazem 240 mg daily -- change to 120 mg daily   Will Rx Diltiazem (short acting) 60 mg PO PRN sustained HR > 110 bpm  Take the additional 20 mg Furosemide (total 40 mg) in AM if you have the "smothering" sensation as well as for swelling.    Will Rx Albuterol Inhaler PRN - for shortness of breath/wheeze/ cough.  Tests Ordered: No orders of the defined types were placed in this encounter. none  Medication Changes: Meds ordered this encounter  Medications   diltiazem (CARDIZEM CD) 120 MG 24 hr capsule    Sig: Take 1 capsule (120 mg total) by mouth daily.    Dispense:  90 capsule    Refill:  3    May use 60 mg diltiazem as needed for >110 heartrate   diltiazem (CARDIZEM) 60 MG tablet    Sig: Take 60 mg tablet by mouth as needed for sustain heartrate >110 .    Dispense:  30 tablet    Refill:  6   albuterol (VENTOLIN HFA) 108 (90 Base) MCG/ACT inhaler    Sig: Inhale 1-2 puffs into the lungs every 6 (six) hours as needed for wheezing or shortness of breath.    Dispense:  1 Inhaler    Refill:  6  see above  Change Diltiazem daily dose  New Rx for PRN Diltiazem  New Rx for Albuterol Inhaler PRN   Disposition:  Follow up in 2 month(s)   Signed, [name], MD  03/07/2019 12:09 PM    [place]

## 2019-03-07 NOTE — Assessment & Plan Note (Signed)
This may be related to seasonal allergies with lots of pollen, but could also be related to some mild cardiac asthma.  He has a history of childhood asthma.  We will prescribe albuterol as well as additional PRN Lasix.

## 2019-03-07 NOTE — Patient Instructions (Addendum)
Medication Instructions:  Stop Diltiazem 240 mg daily --     New prescription was sent for a change -- Diltiazem  120 mg  One capsule by mouth  daily    Prescription sent for Diltiazem (short acting) 60 mg by mouth PRN ( use as needed) sustained HR > 110 bpm  Take the additional 20 mg Furosemide (total 40 mg) in AM if you have the "smothering" sensation as well as for swelling.      Prescription for Albuterol Inhaler ( use as needed) - for shortness of breath/wheeze/ cough.    If you need a refill on your cardiac medications before your next appointment, please call your pharmacy.   Lab work: Not needed.   Testing/Procedures: Not needed  Follow-Up: At [facility], you and your health needs are our priority.  As part of our continuing mission to provide you with exceptional heart care, we have created designated Provider Care Teams.  These Care Teams include your primary Cardiologist (physician) and Advanced Practice Providers (APPs -  Physician Assistants and Nurse Practitioners) who all work together to provide you with the care you need, when you need it. You will need a follow up appointment in 2 months   May 2020.  Please call our office 2 months in advance to schedule this appointment.  You may see [name], MD or one of the following Advanced Practice Providers on your designated Care Team:   Rosaria Ferries, PA-C . Jory Sims, DNP, ANP  Any Other Special Instructions Will Be Listed Below (If Applicable).

## 2019-03-09 ENCOUNTER — Other Ambulatory Visit: Payer: Self-pay | Admitting: Cardiology

## 2019-03-10 ENCOUNTER — Telehealth: Payer: Self-pay | Admitting: Cardiology

## 2019-03-10 MED ORDER — RIVAROXABAN 20 MG PO TABS: ORAL_TABLET | ORAL | 1 refills | Status: DC

## 2019-03-10 MED ORDER — RIVAROXABAN 20 MG PO TABS: ORAL_TABLET | ORAL | 2 refills | Status: DC

## 2019-03-10 NOTE — Telephone Encounter (Signed)
68yo Male Scr = 1.06 per [facility]) Est.CrCl = 84ml/min televisit 03/07/2019

## 2019-03-10 NOTE — Telephone Encounter (Signed)
Per pt miss spoke and did see Xarelto on med list under Rivaroxaban Refill sent as requested and pt aware ./cy

## 2019-03-10 NOTE — Telephone Encounter (Signed)
Refill complete 

## 2019-03-10 NOTE — Telephone Encounter (Signed)
New message     Pt c/o medication issue:  1. Name of Medication: xarelto 20 mg   2. How are you currently taking this medication (dosage and times per day)? 1 time daily  3. Are you having a reaction (difficulty breathing--STAT)? No   4. What is your medication issue? Patient states that this medication is not on his list he needs to know if he should continue to take this medication? Please call.Please send send to [facility] in [place], [place] if needed.

## 2019-04-11 DIAGNOSIS — I428 Other cardiomyopathies: Secondary | ICD-10-CM

## 2019-04-11 HISTORY — DX: Other cardiomyopathies: I42.8

## 2019-05-06 ENCOUNTER — Telehealth: Payer: Self-pay | Admitting: Cardiology

## 2019-05-06 NOTE — Telephone Encounter (Signed)
LVM for pt, reminding him of his appt on 05-09-19 with Dr Ellyn Hack.

## 2019-05-09 ENCOUNTER — Encounter: Payer: Self-pay | Admitting: Cardiology

## 2019-05-09 ENCOUNTER — Ambulatory Visit (INDEPENDENT_AMBULATORY_CARE_PROVIDER_SITE_OTHER): Payer: BC Managed Care – PPO | Admitting: Cardiology

## 2019-05-09 ENCOUNTER — Other Ambulatory Visit: Payer: Self-pay

## 2019-05-09 VITALS — BP 129/84 | HR 91 | Ht 71.0 in | Wt 209.0 lb

## 2019-05-09 DIAGNOSIS — R0609 Other forms of dyspnea: Secondary | ICD-10-CM | POA: Diagnosis not present

## 2019-05-09 DIAGNOSIS — I872 Venous insufficiency (chronic) (peripheral): Secondary | ICD-10-CM

## 2019-05-09 DIAGNOSIS — R0601 Orthopnea: Secondary | ICD-10-CM | POA: Diagnosis not present

## 2019-05-09 DIAGNOSIS — I4819 Other persistent atrial fibrillation: Secondary | ICD-10-CM

## 2019-05-09 DIAGNOSIS — Z5181 Encounter for therapeutic drug level monitoring: Secondary | ICD-10-CM

## 2019-05-09 DIAGNOSIS — Z79899 Other long term (current) drug therapy: Secondary | ICD-10-CM

## 2019-05-09 DIAGNOSIS — R079 Chest pain, unspecified: Secondary | ICD-10-CM | POA: Diagnosis not present

## 2019-05-09 MED ORDER — DIGOXIN 125 MCG PO TABS: 0.1250 mg | ORAL_TABLET | Freq: Every day | ORAL | 3 refills | Status: DC

## 2019-05-09 NOTE — Progress Notes (Signed)
PCP: Camille Bal, PA-C  Clinic Note: Chief Complaint  Patient presents with   Follow-up    2 month   Atrial Fibrillation   Shortness of Breath    HPI: Randall Best is a 68 y.o. male with a PMH notable for persistent proximal A. fib who presents today for 78-month follow-up.  Randall Best was last seen via telehealth visit on March 07, 2019 as a 74-month follow-up.  He had noted about 2 to 3 months of having intermittent spells of feeling sensation of smothering usually occurring at night but not every day.  Noted a dry hacking cough.  One episode of chest pain.  Also noted exertional dyspnea.  Was not yet ready to discuss ablation.  Reluctant to use beta-blocker due to history of asthma and fatigue. --Added a as needed short acting diltiazem, and reduced standing dose of diltiazem down to 120 mg. --Okay to take PRN additional dose of Lasix -did not tolerate increased dose of diltiazem because of edema --> Plan to reassess in 2 months, and if symptoms persist, check 2D echo and possible stress test  Recent Hospitalizations: none  Studies Personally Reviewed - (if available, images/films reviewed: From Epic Chart or Care Everywhere)  none  Interval History: Randall Best returns today for follow-up still having these "smothering spells" at night when sleeping and occasional sharp chest discomfort spells that lasts only a few seconds at a time.  He denies any real exertional dyspnea, and the chest discomfort is not necessarily associate with exertion.  It oftentimes after exertion as opposed with exertion.  The smothering sensation he has with lying down is worse with lying down and improves with sitting up, but he really denies any PND type symptoms of waking up short of breath. He does exercise relatively routinely with his wife but notes that he does have some fatigue and dyspnea if he does more strenuous exercise than usual.  Unfortunately some of this is taken off  because of the COVID-19 restrictions and also the fact that his wife was recently diagnosed with breast cancer and he has been her caregiver and support her with this diagnosis and plan should issue treatment.  He does have some mild swelling, but it seems to be pretty well controlled wearing support stockings/socks.  That is improved since we backed off his diltiazem and he is not having that much in the way of any significant rapid heart rate spells.  He says maybe 3 times a week he takes the additional short acting diltiazem dose.  As for his edema he pretty much is taking of furosemide daily but is not having take any additional doses.  He is not having any bleeding issues with Xarelto.  No lightheadedness, dizziness, weakness or syncope/near syncope. No TIA/amaurosis fugax symptoms. No melena, hematochezia, hematuria, or epstaxis. No claudication.  ROS: A comprehensive was performed. Review of Systems  Constitutional: Positive for malaise/fatigue (Does not feel rested after sleeping).  HENT: Negative for congestion and sinus pain.   Respiratory: Positive for shortness of breath (See HPI with "smothering sensations ").        He indicates that he has been evaluated previously for sleep apnea and was told that he does not have sleep apnea.  He did not derive any benefit from the nasal strips.  Gastrointestinal: Positive for heartburn (Depending on what he eats). Negative for abdominal pain and constipation.  Musculoskeletal: Negative for joint pain.  Neurological: Negative for dizziness and headaches.  Psychiatric/Behavioral: Negative  for memory loss. The patient has insomnia (I am unsure if this is truly insomnia or he just is a light sleeper but does not have good sleep etiquette.  Seems like his entire family has had issues with it a good sleep.  Does not feel rested when he wakes up.). The patient is not nervous/anxious.     The patient does not have symptoms concerning for COVID-19  infection (fever, chills, cough, or new shortness of breath).  The patient is practicing social distancing.      COVID-19 Education: The signs and symptoms of COVID-19 were discussed with the patient and how to seek care for testing (follow up with PCP or arrange E-visit).   The importance of social distancing was discussed today.   I have reviewed and (if needed) personally updated the patient's problem list, medications, allergies, past medical and surgical history, social and family history.   Past Medical History:  Diagnosis Date   Asthma    as child   Atrial fibrillation, rapid ([place]) 12/08/2017   Diagnosed during colonoscopy -presumably new onset   GERD (gastroesophageal reflux disease)    use meds prn   Glaucoma    Headache    remote h/o migraines, none in years   Hyperlipidemia    TIA (transient ischemic attack) 01/13/2018   Ventral hernia     Past Surgical History:  Procedure Laterality Date   CARDIAC EVENT MONITOR  12/2016   showed persistent Afib -monitor return in early because of persistent A. fib.  Several episodes of severe RVR with rates in the 170s were noted.    CARDIOVERSION N/A 01/13/2018   Procedure: CARDIOVERSION;  Surgeon: Pixie Casino, MD;  Location: [facility] ENDOSCOPY;  Service: Cardiovascular;  Laterality: N/A; unsuccessful.-->  Post-cardioversion, the patient had left-sided facial numbness and weakness.  Ruled out for stroke.   INSERTION OF MESH N/A 07/22/2017   Procedure: INSERTION OF MESH;  Surgeon: [name], MD;  Location: [place];  Service: General;  Laterality: N/A;  BILATERAL TAP BLOCK   NM MYOVIEW LTD  12/2017   Shows reduced EF, but no evidence of ischemia or infarction.  EF 40 and 45%.  INTERMEDIATE RISK due to reduced function.  EF notably better on echo (50 and 55%)   TEE WITHOUT CARDIOVERSION N/A 01/13/2018   Procedure: TRANSESOPHAGEAL ECHOCARDIOGRAM (TEE) with cardioversion - ;  Surgeon: Pixie Casino, MD;  [facility]  ENDOSCOPY;  Service: Cardiovascular;  Laterality: N/A;  Mild concentric LVH.  EF 55 to 60%.  No R WMA.  Mild ascending aortic dilation of 4.2 cm.  Dilated left atrium.  No thrombus.   TONSILLECTOMY     TRANSTHORACIC ECHOCARDIOGRAM  12/2017    EF 50 and 55%.  No regional wall motion normalities.   VENTRAL HERNIA REPAIR N/A 07/22/2017   Procedure: OPEN VENTRAL HERNIA REPAIR WITH MESH ERAS PATHWAY;  Surgeon: [name], MD;  Location: [place];  Service: General;  Laterality: N/A;  BILATERAL TAP BLOCK    Current Meds  Medication Sig   acetaminophen (TYLENOL) 325 MG tablet Take 650 mg by mouth every 6 (six) hours as needed.   albuterol (VENTOLIN HFA) 108 (90 Base) MCG/ACT inhaler Inhale 1-2 puffs into the lungs every 6 (six) hours as needed for wheezing or shortness of breath.   Cholecalciferol (VITAMIN D3) 5000 units TABS Take 1 tablet by mouth daily. Take 5,000 units dailt    diltiazem (CARDIZEM CD) 120 MG 24 hr capsule Take 1 capsule (120 mg total) by  mouth daily.   diltiazem (CARDIZEM) 60 MG tablet Take 60 mg tablet by mouth as needed for sustain heartrate >110 .   furosemide (LASIX) 20 MG tablet Take 20 mg  By mouth daily , may take an additional 20 mg tablet if needed for swelling daily   gentamicin cream (GARAMYCIN) 0.1 % Apply 1 application topically 3 (three) times daily.   hydrocortisone 2.5 % cream APPLY A THIN LAYER TO FACE TWICE A DAY AS NEEDED PRN 1 WEEK ON, 1 WEEK OFF PRN FLARES   Omega-3 Fatty Acids (FISH OIL) 1200 MG CAPS Take 1,200 mg by mouth daily.   rivaroxaban (XARELTO) 20 MG TABS tablet TAKE 1 TABLET (20 MG TOTAL) BY MOUTH DAILY WITH SUPPER.   Specialty Vitamins Products (ULTRA MAN PO) Take 1 tablet by mouth daily.     Allergies  Allergen Reactions   Ezetimibe Other (See Comments)    Broke out in sores/ muscle pain   Omeprazole Nausea And Vomiting   Statins Other (See Comments)    Muscle pains, mouth sores    Social History   Tobacco Use    Smoking status: Never Smoker   Smokeless tobacco: Never Used  Substance Use Topics   Alcohol use: Yes    Comment: Rarely   Drug use: No   Social History   Social History Narrative   He is a married father of 67, grandfather of 1.  Lives with his wife.   He does exercise regularly most days of the week for about 30 minutes.  He walks about 1-3 miles a day.  He will occasionally also walk on the treadmill.  He notes his energy is better if he remains active.    family history includes CVA in his maternal grandmother; Diabetes in his mother; Heart failure in his father; Hypertension in his brother.  Wt Readings from Last 3 Encounters:  05/09/19 209 lb (94.8 kg)  03/07/19 216 lb (98 kg)  09/08/18 219 lb 3.2 oz (99.4 kg)    PHYSICAL EXAM BP 129/84    Pulse 91    Ht 5\' 11"  (1.803 m)    Wt 209 lb (94.8 kg)    BMI 29.15 kg/m  Physical Exam  Constitutional: He is oriented to person, place, and time. He appears well-developed and well-nourished. No distress.  Healthy-appearing.  Well-groomed  HENT:  Head: Normocephalic and atraumatic.  Eyes: EOM are normal.  Neck: Normal range of motion. Neck supple. No hepatojugular reflux and no JVD present. Carotid bruit is not present.  Cardiovascular: Normal rate, normal heart sounds and intact distal pulses. An irregularly irregular rhythm present.  No extrasystoles are present. PMI is not displaced. Exam reveals no gallop and no friction rub.  No murmur heard. Pulmonary/Chest: Effort normal and breath sounds normal. No respiratory distress. He has no wheezes. He has no rales.  Abdominal: Bowel sounds are normal. He exhibits no distension. There is no abdominal tenderness. There is no rebound.  Musculoskeletal: Normal range of motion.        General: Edema (Trivial) present.  Neurological: He is alert and oriented to person, place, and time.  Psychiatric: He has a normal mood and affect. His behavior is normal. Judgment and thought content  normal.  Vitals reviewed.    Adult ECG Report  Rate: 91 ;  Rhythm: atrial fibrillation and mild non-specific ST-T changes;   Narrative Interpretation: stable, but axis is not as rightward   Other studies Reviewed: Additional studies/ records that were reviewed today include:  Recent Labs:  n/a   ASSESSMENT / PLAN: Problem List Items Addressed This Visit    Venous insufficiency of right lower extremity > Left (Chronic)    Improved with reduced dose of diltiazem, and maintain relatively stable with daily Lasix.  Also recommend continued support stockings/socks .      Relevant Medications   digoxin (LANOXIN) 0.125 MG tablet   Persistent atrial fibrillation ([place]): CHA2DS2-VASc Score 2 - Primary (Chronic)    Pretty much now permanent persistent A. fib.  Still not ready to discuss ablation. He does have a notable improvement in his energy having switched to diltiazem, but did not tolerate the higher dose because of edema.  His Raynaud's continues to be controlled although a little bit higher with the lower dose of diltiazem and he is occasionally taking the additional dose of short acting diltiazem.  I would like to improve his rate control a little bit and potentially help with some the dyspnea that can be associated with tachycardia with the addition of digoxin.  He did not seem all that excited on the idea of a true antiarrhythmic, and given failed cardioversion, I suspect that it would take a more potent longstanding antiarrhythmic to maintain sinus rhythm.  Plan:   Continue current dose of long-acting diltiazem with PRN short acting dose.  Continue rivaroxaban for anticoagulation  Add digoxin: With loading dose over the first 2 days  Check digoxin level in 2 months.       Relevant Medications   digoxin (LANOXIN) 0.125 MG tablet   Other Relevant Orders   EKG 12-Lead (Completed)   Digoxin level   ECHOCARDIOGRAM COMPLETE   Orthopnea (Chronic)    With is now smothering  sensation with laying down I suspect this could be orthopnea, it could simply just be sleep apnea type symptoms, but I would like to reevaluate an echocardiogram to better assess his systolic and diastolic function. Adding digoxin for better rate control      Relevant Orders   EKG 12-Lead (Completed)   Digoxin level   ECHOCARDIOGRAM COMPLETE   Exertional dyspnea (Chronic)   Relevant Orders   EKG 12-Lead (Completed)   Digoxin level   ECHOCARDIOGRAM COMPLETE   Chest pain with low risk for cardiac etiology (Chronic)    He had a negative Myoview recently, and his symptoms he is feeling a reasonable sharp twinging type chest pain sensations that are not exertional in nature.  Based on the description of the symptoms, most likely more musculoskeletal and less likely cardiac      Relevant Orders   EKG 12-Lead (Completed)   Digoxin level   ECHOCARDIOGRAM COMPLETE    Other Visit Diagnoses    Encounter for monitoring digoxin therapy       Relevant Orders   Digoxin level   Medication management       Relevant Orders   Digoxin level      I spent a total of 25 minutes with the patient and chart review. >  50% of the time was spent in direct patient consultation.   Current medicines are reviewed at length with the patient today.  (+/- concerns) better with lower dose Diltiazem - less edema  The following changes have been made:  Add digoxin   Patient Instructions  Medication Instructions:  DIGOXIN  0.125 MG  DAY 1 AND 2  TAKE 2 TABLETS THEN GO TO  1 TABLET A DAY  If you need a refill on your cardiac medications before your next appointment,  please call your pharmacy.   Lab work: COME TO OFFICE [place] [facility] .  DIGOXIN LEVEL IN 2 MONTHS -- @ AUG 29 ,2020 - DO NOT TAKE THE MEDICATION THAT MORNING UNTIL YOU HAVE LAB WORK DONE .  If you have labs (blood work) drawn today and your tests are completely normal, you will receive your results only by:  [place]  (if you have MyChart) OR  A paper copy in the mail If you have any lab test that is abnormal or we need to change your treatment, we will call you to review the results.  Testing/Procedures: WILL BE SCHEDULE TO [place] [facility] Your physician has requested that you have an echocardiogram. Echocardiography is a painless test that uses sound waves to create images of your heart. It provides your doctor with information about the size and shape of your heart and how well your hearts chambers and valves are working. This procedure takes approximately one hour. There are no restrictions for this procedure.     Follow-Up: At [facility], you and your health needs are our priority.  As part of our continuing mission to provide you with exceptional heart care, we have created designated Provider Care Teams.  These Care Teams include your primary Cardiologist (physician) and Advanced Practice Providers (APPs -  Physician Assistants and Nurse Practitioners) who all work together to provide you with the care you need, when you need it.  You will need a follow up appointment in    4  Months Oct 2021.  Please call our office 2 months in advance to schedule this appointment.  You may see [name], MD or one of the following Advanced Practice Providers on your designated Care Team:    Rosaria Ferries, PA-C  Jory Sims, DNP, ANP  Any Other Special Instructions Will Be Listed Below (If Applicable).    Studies Ordered:   Orders Placed This Encounter  Procedures   Digoxin level   EKG 12-Lead   ECHOCARDIOGRAM COMPLETE      [name], M.D., M.S. Interventional Cardiologist   Pager # [phone] Phone # [phone] [address] [place], [place] [address]   Thank you for choosing Heartcare at [facility]!!

## 2019-05-09 NOTE — Patient Instructions (Addendum)
Medication Instructions:  DIGOXIN  0.125 MG  DAY 1 AND 2  TAKE 2 TABLETS THEN GO TO  1 TABLET A DAY  If you need a refill on your cardiac medications before your next appointment, please call your pharmacy.   Lab work: COME TO OFFICE [place] [address] .  DIGOXIN LEVEL IN 2 MONTHS -- @ AUG 29 ,2020 - DO NOT TAKE THE MEDICATION THAT MORNING UNTIL YOU HAVE LAB WORK DONE .  If you have labs (blood work) drawn today and your tests are completely normal, you will receive your results only by: [name] MyChart Message (if you have MyChart) OR . A paper copy in the mail If you have any lab test that is abnormal or we need to change your treatment, we will call you to review the results.  Testing/Procedures: WILL BE SCHEDULE TO [place] [address] Your physician has requested that you have an echocardiogram. Echocardiography is a painless test that uses sound waves to create images of your heart. It provides your doctor with information about the size and shape of your heart and how well your heart's chambers and valves are working. This procedure takes approximately one hour. There are no restrictions for this procedure.     Follow-Up: At [facility], you and your health needs are our priority.  As part of our continuing mission to provide you with exceptional heart care, we have created designated Provider Care Teams.  These Care Teams include your primary Cardiologist (physician) and Advanced Practice Providers (APPs -  Physician Assistants and Nurse Practitioners) who all work together to provide you with the care you need, when you need it. . You will need a follow up appointment in    4  Months Oct 2021.  Please call our office 2 months in advance to schedule this appointment.  You may see [name], MD or one of the following Advanced Practice Providers on your designated Care Team:   . Rosaria Ferries, PA-C . Jory Sims, DNP, ANP  Any Other Special  Instructions Will Be Listed Below (If Applicable).

## 2019-05-10 ENCOUNTER — Encounter: Payer: Self-pay | Admitting: Cardiology

## 2019-05-10 NOTE — Assessment & Plan Note (Signed)
Improved with reduced dose of diltiazem, and maintain relatively stable with daily Lasix.  Also recommend continued support stockings/socks .

## 2019-05-10 NOTE — Assessment & Plan Note (Signed)
Pretty much now permanent persistent A. fib.  Still not ready to discuss ablation. He does have a notable improvement in his energy having switched to diltiazem, but did not tolerate the higher dose because of edema.  His Raynaud's continues to be controlled although a little bit higher with the lower dose of diltiazem and he is occasionally taking the additional dose of short acting diltiazem.  I would like to improve his rate control a little bit and potentially help with some the dyspnea that can be associated with tachycardia with the addition of digoxin.  He did not seem all that excited on the idea of a true antiarrhythmic, and given failed cardioversion, I suspect that it would take a more potent longstanding antiarrhythmic to maintain sinus rhythm.  Plan:   Continue current dose of long-acting diltiazem with PRN short acting dose.  Continue rivaroxaban for anticoagulation  Add digoxin: With loading dose over the first 2 days  Check digoxin level in 2 months.

## 2019-05-10 NOTE — Assessment & Plan Note (Signed)
He had a negative Myoview recently, and his symptoms he is feeling a reasonable sharp twinging type chest pain sensations that are not exertional in nature.  Based on the description of the symptoms, most likely more musculoskeletal and less likely cardiac

## 2019-05-10 NOTE — Assessment & Plan Note (Signed)
With is now smothering sensation with laying down I suspect this could be orthopnea, it could simply just be sleep apnea type symptoms, but I would like to reevaluate an echocardiogram to better assess his systolic and diastolic function. Adding digoxin for better rate control

## 2019-06-11 HISTORY — PX: TRANSTHORACIC ECHOCARDIOGRAM: SHX275

## 2019-07-05 ENCOUNTER — Other Ambulatory Visit: Payer: Self-pay

## 2019-07-05 ENCOUNTER — Telehealth: Payer: Self-pay

## 2019-07-05 ENCOUNTER — Ambulatory Visit (HOSPITAL_COMMUNITY): Payer: BC Managed Care – PPO | Attending: Cardiovascular Disease

## 2019-07-05 DIAGNOSIS — R0609 Other forms of dyspnea: Secondary | ICD-10-CM | POA: Diagnosis not present

## 2019-07-05 DIAGNOSIS — I4819 Other persistent atrial fibrillation: Secondary | ICD-10-CM | POA: Diagnosis not present

## 2019-07-05 DIAGNOSIS — R079 Chest pain, unspecified: Secondary | ICD-10-CM | POA: Diagnosis not present

## 2019-07-05 DIAGNOSIS — R0601 Orthopnea: Secondary | ICD-10-CM

## 2019-07-05 NOTE — Telephone Encounter (Signed)
The patient was in for an echo today and complained of fatigue in his legs. The tech requested Nursing assistance when the patient had issues lying flat for his ultrasound and saw his EF was reduced to about 20%. Went and assessed the patient, who states he has no new issues and Dr. Ellyn Hack is aware of his "smothering sensation" when he lies down and occasional DOE.  He has had to take no Lasix for swelling. He states his legs were fatigued because he sat too long before having his ultrasound completed. The patient was able to walk the length of the office twice and did not have any symptoms.  Offered him an appointment early next week with Dr. Ellyn Hack but he declined, stating he was fine and isn't due to be seen until October.  Reiterated to him that Dr. Ellyn Hack may make medication adjustments based on echo results.  He finally agreed to schedule an appointment 9/18. He understands Dr. Ellyn Hack may wish to see him sooner than that based off results. He was grateful for assistance.

## 2019-07-05 NOTE — Telephone Encounter (Signed)
I do agree that we will probably be seeing him a whole lot sooner either with me or 1 of the apps.  We will have Ivin Booty call him about the results and what we need to do.  We want to switch him from diltiazem etc.  Probably does need to be on Lasix.  [name], MD

## 2019-07-06 ENCOUNTER — Telehealth: Payer: Self-pay | Admitting: *Deleted

## 2019-07-06 DIAGNOSIS — E785 Hyperlipidemia, unspecified: Secondary | ICD-10-CM | POA: Diagnosis not present

## 2019-07-06 DIAGNOSIS — K219 Gastro-esophageal reflux disease without esophagitis: Secondary | ICD-10-CM | POA: Diagnosis not present

## 2019-07-06 DIAGNOSIS — Z125 Encounter for screening for malignant neoplasm of prostate: Secondary | ICD-10-CM | POA: Diagnosis not present

## 2019-07-06 DIAGNOSIS — Z13220 Encounter for screening for lipoid disorders: Secondary | ICD-10-CM | POA: Diagnosis not present

## 2019-07-06 DIAGNOSIS — I480 Paroxysmal atrial fibrillation: Secondary | ICD-10-CM | POA: Diagnosis not present

## 2019-07-06 DIAGNOSIS — Z0000 Encounter for general adult medical examination without abnormal findings: Secondary | ICD-10-CM | POA: Diagnosis not present

## 2019-07-06 NOTE — Telephone Encounter (Signed)
-----   Message from [name], MD sent at 07/06/2019  8:32 AM EDT ----- Echocardiogram result is quite concerning.  Pump function is significantly reduced and this probably explains some of the symptoms.  We need to schedule him to be seen ASAP either by myself or 1 of the APP's.  At this point I think we probably need to reevaluate causes for significant drop in EF including coronary artery disease probably I think we probably need to discuss right and left heart catheterization We will need to convert from diltiazem to a different medication for his A. fib as diltiazem is not indicated with reduced ejection fraction.  [name], MD

## 2019-07-06 NOTE — Telephone Encounter (Signed)
SPOKE TO PATIENT -- RESULT GIVEN -- APPOINTMENT SCHEDULE FOR 07/11/19 PER DR HARDING ROUTED TO PRIMARY

## 2019-07-06 NOTE — Telephone Encounter (Signed)
Spoke to patient earlier today   patient aware  Abnormality with test appointment schedule for 07/11/19 at 8:20

## 2019-07-08 DIAGNOSIS — I4819 Other persistent atrial fibrillation: Secondary | ICD-10-CM | POA: Diagnosis not present

## 2019-07-08 DIAGNOSIS — R0601 Orthopnea: Secondary | ICD-10-CM | POA: Diagnosis not present

## 2019-07-08 DIAGNOSIS — Z79899 Other long term (current) drug therapy: Secondary | ICD-10-CM | POA: Diagnosis not present

## 2019-07-08 DIAGNOSIS — R0609 Other forms of dyspnea: Secondary | ICD-10-CM | POA: Diagnosis not present

## 2019-07-08 DIAGNOSIS — R079 Chest pain, unspecified: Secondary | ICD-10-CM | POA: Diagnosis not present

## 2019-07-08 LAB — DIGOXIN LEVEL: Digoxin, Serum: <0.4 ng/mL — ABNORMAL LOW (ref 0.5–0.9)

## 2019-07-11 ENCOUNTER — Other Ambulatory Visit (HOSPITAL_COMMUNITY)
Admission: RE | Admit: 2019-07-11 | Discharge: 2019-07-11 | Disposition: A | Discharge status: Home / Self Care | Payer: BC Managed Care – PPO | Source: Ambulatory Visit | Attending: Cardiology | Admitting: Cardiology

## 2019-07-11 ENCOUNTER — Ambulatory Visit (INDEPENDENT_AMBULATORY_CARE_PROVIDER_SITE_OTHER): Payer: BC Managed Care – PPO | Admitting: Cardiology

## 2019-07-11 ENCOUNTER — Encounter: Payer: Self-pay | Admitting: Cardiology

## 2019-07-11 ENCOUNTER — Other Ambulatory Visit: Payer: Self-pay

## 2019-07-11 VITALS — BP 124/78 | HR 83 | Temp 97.2°F | Ht 69.0 in | Wt 213.0 lb

## 2019-07-11 DIAGNOSIS — R0609 Other forms of dyspnea: Secondary | ICD-10-CM | POA: Diagnosis not present

## 2019-07-11 DIAGNOSIS — Z01812 Encounter for preprocedural laboratory examination: Secondary | ICD-10-CM | POA: Insufficient documentation

## 2019-07-11 DIAGNOSIS — Z20828 Contact with and (suspected) exposure to other viral communicable diseases: Secondary | ICD-10-CM | POA: Insufficient documentation

## 2019-07-11 DIAGNOSIS — I4819 Other persistent atrial fibrillation: Secondary | ICD-10-CM

## 2019-07-11 DIAGNOSIS — I872 Venous insufficiency (chronic) (peripheral): Secondary | ICD-10-CM

## 2019-07-11 DIAGNOSIS — R079 Chest pain, unspecified: Secondary | ICD-10-CM

## 2019-07-11 DIAGNOSIS — E782 Mixed hyperlipidemia: Secondary | ICD-10-CM

## 2019-07-11 DIAGNOSIS — I420 Dilated cardiomyopathy: Secondary | ICD-10-CM | POA: Diagnosis not present

## 2019-07-11 DIAGNOSIS — R0601 Orthopnea: Secondary | ICD-10-CM

## 2019-07-11 LAB — SARS CORONAVIRUS 2 (TAT 6-24 HRS): SARS Coronavirus 2: NEGATIVE

## 2019-07-11 MED ORDER — FUROSEMIDE 40 MG PO TABS: 40.0000 mg | ORAL_TABLET | Freq: Every day | ORAL | 3 refills | Status: DC

## 2019-07-11 MED ORDER — SPIRONOLACTONE 25 MG PO TABS: 12.5000 mg | ORAL_TABLET | Freq: Every day | ORAL | 3 refills | Status: DC

## 2019-07-11 MED ORDER — CARVEDILOL 3.125 MG PO TABS: 3.1250 mg | ORAL_TABLET | Freq: Two times a day (BID) | ORAL | 3 refills | Status: DC

## 2019-07-11 NOTE — Assessment & Plan Note (Addendum)
This the first time he, mentioned having exertional chest pain.  Previous Myoview about 18 months ago was negative, now he is describing pressure and tightness associated with exertion but not the sharp twinging pain he had before.  With severely reduced EF and poorly controlled lipids, cannot exclude ischemic cardiomyopathy and ischemic chest pain. Plan: Right and Left Heart Catheterization with possible PCI. Start aspirin 81 mg daily and take 324 mg morning of cath. We will have to hold Xarelto for cath.

## 2019-07-11 NOTE — H&P (View-Only) (Signed)
PCP: Camille Bal, PA-C  Clinic Note: Chief Complaint  Patient presents with   Follow-up    Post Echo. Coughing late in the eveing or at night.   Headache   Shortness of Breath    Every night while laying in bed. - sleeps sitting up    Chest Pain    LLQ pain    HPI: Randall Best is a 68 y.o. male with a PMH notable for persistent PAF (controlled with diltiazem) who presents today for follow-up of recent abnormal echocardiogram performed to evaluate progressive dyspnea --suggesting dilated Cardama.  Randall Best was last seen on 05/09/2019 noting episodes of "smothering spells at night while sleeping that time really only noted dyspnea with significant exertion.  He noted mild swelling but controlled with support stockings.  This is a chronic issue for him.  It had improved since backing off diltiazem and taking daily Lasix  Recent Hospitalizations: none  Studies Personally Reviewed - (if available, images/films reviewed: From Epic Chart or Care Everywhere)  Echo 07/05/2019: Severely reduced function with diffuse hypokinesis and septal dyskinesis.  EF <20%.  Cannot exclude LV thrombus.  Reduced RV function with mildly elevated RV P--37 mmHg.  Moderate RA and mild LA dilation.  A centimeter dilation 42 mm  Interval History: Randall Best presents here today indicating that since I last saw him, he still is having significant smothering episodes at night difficulty breathing.  He is now having to sleep pretty much upright in the chair.  He is got a worsening cough, dry and nonproductive.  He is also been noticing a little bit of edema right greater than left but that is pretty stable.  He has been noticing worsening exertional dyspnea and fatigue.  Energy intolerance.  He has occasionally had some tightness and pressure when he does exertion.  Sometimes he feels that with numbness in his left arm.  He did note that when he first started taking the digoxin, the symptoms have  improved a little bit.  Review of REMAINING CARDIAC SYMPTOMS: No palpitations, lightheadedness, dizziness, weakness or syncope/near syncope. No TIA/amaurosis fugax symptoms. No melena, hematochezia, hematuria, or epstaxis. No claudication.  ROS: A comprehensive was performed. Review of Systems  Constitutional: Positive for malaise/fatigue. Negative for weight loss (Weight is actually gone up).  HENT: Negative for congestion and nosebleeds.   Respiratory: Positive for cough and shortness of breath. Negative for sputum production and wheezing.   Gastrointestinal: Positive for abdominal pain (Left lower quadrant pain). Negative for heartburn and nausea.  Genitourinary: Negative for hematuria.  Musculoskeletal: Negative for joint pain.  Neurological: Positive for dizziness, weakness (Global) and headaches. Negative for focal weakness.  Psychiatric/Behavioral: Negative for memory loss. The patient has insomnia (Not really insomnia, just poor sleep). The patient is not nervous/anxious.   All other systems reviewed and are negative.  The patient does not have symptoms concerning for COVID-19 infection (fever, chills, cough, or new shortness of breath).  The patient is practicing social distancing.   COVID-19 Education: The signs and symptoms of COVID-19 were discussed with the patient and how to seek care for testing (follow up with PCP or arrange E-visit).   The importance of social distancing was discussed today.   I have reviewed and (if needed) personally updated the patient's problem list, medications, allergies, past medical and surgical history, social and family history.   Past Medical History:  Diagnosis Date   Asthma    as child   Atrial fibrillation, rapid ([place]) 12/08/2017  Diagnosed during colonoscopy -presumably new onset   GERD (gastroesophageal reflux disease)    use meds prn   Glaucoma    Headache    remote h/o migraines, none in years   Hyperlipidemia     TIA (transient ischemic attack) 01/13/2018   Ventral hernia     Past Surgical History:  Procedure Laterality Date   CARDIAC EVENT MONITOR  12/2016   showed persistent Afib -monitor return in early because of persistent A. fib.  Several episodes of severe RVR with rates in the 170s were noted.    CARDIOVERSION N/A 01/13/2018   Procedure: CARDIOVERSION;  Surgeon: Pixie Casino, MD;  Location: [facility] ENDOSCOPY;  Service: Cardiovascular;  Laterality: N/A; unsuccessful.-->  Post-cardioversion, the patient had left-sided facial numbness and weakness.  Ruled out for stroke.   INSERTION OF MESH N/A 07/22/2017   Procedure: INSERTION OF MESH;  Surgeon: [name], MD;  Location: [facility];  Service: General;  Laterality: N/A;  BILATERAL TAP BLOCK   NM MYOVIEW LTD  12/2017   Shows reduced EF, but no evidence of ischemia or infarction.  EF 40 and 45%.  INTERMEDIATE RISK due to reduced function.  EF notably better on echo (50 and 55%)   TEE WITHOUT CARDIOVERSION N/A 01/13/2018   Procedure: TRANSESOPHAGEAL ECHOCARDIOGRAM (TEE) with cardioversion - ;  Surgeon: Pixie Casino, MD;  Location: [facility] ENDOSCOPY;  Service: Cardiovascular;  Laterality: N/A;  Mild concentric LVH.  EF 55 to 60%.  No R WMA.  Mild ascending aortic dilation of 4.2 cm.  Dilated left atrium.  No thrombus.   TONSILLECTOMY     TRANSTHORACIC ECHOCARDIOGRAM  12/2017    EF 50 and 55%.  No regional wall motion normalities.   TRANSTHORACIC ECHOCARDIOGRAM  06/2019    Severely reduced function with diffuse hypokinesis and septal dyskinesis.  EF <20%.  Cannot exclude LV thrombus.  Reduced RV function with mildly elevated RV P--37 mmHg.  Moderate RA and mild LA dilation.  A centimeter dilation 42 mm   VENTRAL HERNIA REPAIR N/A 07/22/2017   Procedure: OPEN VENTRAL HERNIA REPAIR WITH MESH ERAS PATHWAY;  Surgeon: [name], MD;  Location: [facility];  Service: General;  Laterality: N/A;  BILATERAL TAP BLOCK    Current Meds  Medication Sig     acetaminophen (TYLENOL) 325 MG tablet Take 650 mg by mouth every 6 (six) hours as needed.   albuterol (VENTOLIN HFA) 108 (90 Base) MCG/ACT inhaler Inhale 1-2 puffs into the lungs every 6 (six) hours as needed for wheezing or shortness of breath.   Cholecalciferol (VITAMIN D3) 5000 units TABS Take 5,000 Units by mouth daily.    digoxin (LANOXIN) 0.125 MG tablet Take 1 tablet (0.125 mg total) by mouth daily.   hydrocortisone 2.5 % cream Apply 1 application topically 2 (two) times daily as needed (skin irritation/rash).    Omega-3 Fatty Acids (FISH OIL) 1200 MG CAPS Take 1,200 mg by mouth daily.   rivaroxaban (XARELTO) 20 MG TABS tablet TAKE 1 TABLET (20 MG TOTAL) BY MOUTH DAILY WITH SUPPER. (Patient taking differently: Take 20 mg by mouth daily with supper. )   Specialty Vitamins Products (ULTRA MAN PO) Take 1 tablet by mouth daily.    [DISCONTINUED] diltiazem (CARDIZEM CD) 120 MG 24 hr capsule Take 1 capsule (120 mg total) by mouth daily.   [DISCONTINUED] diltiazem (CARDIZEM) 60 MG tablet Take 60 mg tablet by mouth as needed for sustain heartrate >110 .   [DISCONTINUED] furosemide (LASIX) 20 MG tablet Take 20 mg  By mouth daily , may take an additional 20 mg tablet if needed for swelling daily   [DISCONTINUED] gentamicin cream (GARAMYCIN) 0.1 % Apply 1 application topically 3 (three) times daily.    Allergies  Allergen Reactions   Ezetimibe Other (See Comments)    Broke out in sores/ muscle pain   Omeprazole Nausea And Vomiting   Statins Other (See Comments)    Muscle pains, mouth sores    Social History   Tobacco Use   Smoking status: Never Smoker   Smokeless tobacco: Never Used  Substance Use Topics   Alcohol use: Yes    Comment: Rarely   Drug use: No   Social History   Social History Narrative   He is a married father of 40, grandfather of 1.  Lives with his wife.   He does exercise regularly most days of the week for about 30 minutes.  He walks about 1-3  miles a day.  He will occasionally also walk on the treadmill.  He notes his energy is better if he remains active.  --His wife is finishing up his treatment for breast cancer with chemotherapy radiation.  T.  family history includes CVA in his maternal grandmother; Diabetes in his mother; Heart failure in his father; Hypertension in his brother.  Wt Readings from Last 3 Encounters:  07/11/19 213 lb (96.6 kg)  05/09/19 209 lb (94.8 kg)  03/07/19 216 lb (98 kg)    PHYSICAL EXAM BP 124/78 (BP Location: Left Arm, Patient Position: Sitting, Cuff Size: Normal)    Pulse 83    Temp (!) 97.2 F (36.2 C)    Ht 5\' 9"  (1.753 m)    Wt 213 lb (96.6 kg)    BMI 31.45 kg/m  Physical Exam  Constitutional: He is oriented to person, place, and time. He appears well-developed and well-nourished.  HENT:  Head: Normocephalic and atraumatic.  Eyes: EOM are normal.  Neck: Normal range of motion. Neck supple. No hepatojugular reflux and no JVD present. Carotid bruit is not present.  Cardiovascular: Normal rate, normal heart sounds and intact distal pulses. An irregularly irregular rhythm present.  No extrasystoles are present. PMI is not displaced. Exam reveals no gallop and no friction rub.  No murmur heard. Split S2  Pulmonary/Chest: Effort normal and breath sounds normal. No respiratory distress. He has no wheezes. He has no rales.  Abdominal: Soft. Bowel sounds are normal. He exhibits no distension. There is no abdominal tenderness.  Musculoskeletal: Normal range of motion.        General: Edema (Trivial) present.     Comments: Significant left greater than right swollen dilated varicose veins.  Neurological: He is alert and oriented to person, place, and time.  Skin:  Mild bruising.  Spider veins on legs.  Psychiatric: His behavior is normal. Judgment and thought content normal.  Vitals reviewed.    Adult ECG Report  Rate: 83 ;  Rhythm: atrial fibrillation and LBBB (new).  Normal rate & axis.  ;    Narrative Interpretation: New LBBB   Other studies Reviewed: Additional studies/ records that were reviewed today include:  Recent Labs:   Lab Results  Component Value Date   CREATININE 0.96 01/06/2018   BUN 15 01/06/2018   NA 142 01/06/2018   K 4.4 01/06/2018   CL 103 01/06/2018   CO2 24 01/06/2018   Lab Results  Component Value Date   CHOL  213 (H)  07/06/2019   HDL  43  07/06/2019  [place] 147 (H)  07/06/2019   TRIG 116  07/06/2019    ASSESSMENT / PLAN: Problem List Items Addressed This Visit    Venous insufficiency of left>>right lower extremity (Chronic)    Discussed benefit of support stockings.  We can probably given prescription for support stockings to wear most notably on the left leg but could also on both sides.      Relevant Medications   carvedilol (COREG) 3.125 MG tablet   spironolactone (ALDACTONE) 25 MG tablet   furosemide (LASIX) 40 MG tablet   Persistent atrial fibrillation ([place]): CHA2DS2-VASc Score 2 - Primary (Chronic)    Persistent A. fib.  Had been relatively well rate controlled with diltiazem, however we want to switch from diltiazem to a beta-blocker with reduced EF. Plan: Continue digoxin.  (Digoxin levels were below therapeutic)  DC digoxin, start carvedilol 3.125 mg twice daily  Continue Xarelto, but will need to hold for cath      Relevant Medications   carvedilol (COREG) 3.125 MG tablet   spironolactone (ALDACTONE) 25 MG tablet   furosemide (LASIX) 40 MG tablet   Other Relevant Orders   EKG 12-Lead   LEFT AND RIGHT HEART CATHETERIZATION WITH CORONARY ANGIOGRAM   Orthopnea (Chronic)    Most likely consistent with combined systolic and diastolic heart failure. Plan: Increase furosemide 40 mg twice daily until cath, start spironolactone 12.5 mg daily. Right and left heart catheterization.      Hyperlipidemia (Chronic)    Most recent LDL is 147.  Total cholesterol 213.  He has been pretty much intolerant of statins in the past.   Depending on what the cardiac cath shows.  Anticipate that we will probably need to consider referral for PCSK9 inhibitor.      Relevant Medications   carvedilol (COREG) 3.125 MG tablet   spironolactone (ALDACTONE) 25 MG tablet   furosemide (LASIX) 40 MG tablet   Exertional dyspnea (Chronic)    Worsening exertional dyspnea now with chest discomfort.  With severely reduced EF, will now proceed with Right & Left Heart Catheterization and possible PCI.      Relevant Orders   EKG 12-Lead   LEFT AND RIGHT HEART CATHETERIZATION WITH CORONARY ANGIOGRAM   Exertional chest pain    This the first time he, mentioned having exertional chest pain.  Previous Myoview about 18 months ago was negative, now he is describing pressure and tightness associated with exertion but not the sharp twinging pain he had before.  With severely reduced EF and poorly controlled lipids, cannot exclude ischemic cardiomyopathy and ischemic chest pain. Plan: Right and Left Heart Catheterization with possible PCI. Start aspirin 81 mg daily and take 324 mg morning of cath. We will have to hold Xarelto for cath.      Dilated cardiomyopathy ([place])    New diagnosis of severely reduced EF, somewhat exacerbated probably by new left bundle branch block which seems to be progression of interventricular conduction delay PVC noted.  The combination of the LBBB and A. fib is probably contributing to his reduced EF.  He is also now noted significant exertional dyspnea and chest discomfort. Plan: Right and LEFT HEART CATHETERIZATION WITH CORONARY ANGIOGRAPHY and POSSIBLE PERCUTANEOUS CORONARY INTERVENTION  Convert from diltiazem to carvedilol 3.25 mg twice daily  Start spironolactone 12.5 mg daily  He does have a about a 4 pound weight gain since June. -->  Increase furosemide to 40 mg twice daily until cath.  Continue digoxin  Depending on blood pressure, consider Entresto  Relevant Medications   carvedilol (COREG) 3.125  MG tablet   spironolactone (ALDACTONE) 25 MG tablet   furosemide (LASIX) 40 MG tablet   Other Relevant Orders   EKG 12-Lead   LEFT AND RIGHT HEART CATHETERIZATION WITH CORONARY ANGIOGRAM      I spent a total of 30 minutes with the patient and chart review. >  50% of the time was spent in direct patient consultation.  Detailed explanation of the echocardiogram results, changes in therapy as well as explanation of right and left heart catheterization and possible PCI.  The procedure with Risks/Benefits/Alternatives and Indications was reviewed with the patient.  All questions were answered.    Risks / Complications include, but not limited to: Death, MI, CVA/TIA, VF/VT (with defibrillation), Bradycardia (need for temporary pacer placement), contrast induced nephropathy, bleeding / bruising / hematoma / pseudoaneurysm, vascular or coronary injury (with possible emergent CT or Vascular Surgery), adverse medication reactions, infection.  Additional risks involving the use of radiation with the possibility of radiation burns and cancer were explained in detail.  The patient voices understanding and agree to proceed.     Current medicines are reviewed at length with the patient today.  (+/- concerns) none The following changes have been made:  See below  Patient Instructions  Medication Instructions:  - STOP TAKING DILTIAZEM  - START  CREG ( CARVEDILOL) 3.125 MG  ONE TABLET TWICE A DAY   - START TAKING SPIRONOLACTONE   12.5 MG ( 1/2 TABLET )  ONCE DAILY   -INCREASE LASIX ( FUROSEMIDE ) 40 MG  DAILY  - ( FOR THE NEXT 3 DAYS TAKE TWICE A DAY )  - LAST DOSE OF XARELTO  WIL BE TONIGHT UNTIL YOU HAVE YOUR CATH ON Thursday   If you need a refill on your cardiac medications before your next appointment, please call your pharmacy.   Lab work:  COVID TEST TODAY   FOLLOW INSTRUCTION ON ENCLOSED LETTER I  Testing/Procedures: WILL BE SCHEDULE AT SEPT 3 , 2020 - AT [place] AT 7  AM  WILL NEED TO HAVE COVID TEST  TODAY - 2:10 PM - [place]  Your physician has requested that you have a  RIGHT  AND LEFT  cardiac catheterization. Cardiac catheterization is used to diagnose and/or treat various heart conditions. Doctors may recommend this procedure for a number of different reasons. The most common reason is to evaluate chest pain. Chest pain can be a symptom of coronary artery disease (CAD), and cardiac catheterization can show whether plaque is narrowing or blocking your hearts arteries. This procedure is also used to evaluate the valves, as well as measure the blood flow and oxygen levels in different parts of your heart. For further information please visit [email or web address]. Please follow instruction sheet, as given.     Follow-Up: At [facility], you and your health needs are our priority.  As part of our continuing mission to provide you with exceptional heart care, we have created designated Provider Care Teams.  These Care Teams include your primary Cardiologist (physician) and Advanced Practice Providers (APPs -  Physician Assistants and Nurse Practitioners) who all work together to provide you with the care you need, when you need it.  You will need a follow up appointment in  2-3 WEEKS .  Please call our office 2 months in advance to schedule this appointment.  You may see [name], MD or one of the following Advanced Practice Providers on your  designated Care Team:    Rosaria Ferries, PA-C  Jory Sims, DNP, ANP  Any Other Special Instructions Will Be Listed Below .      [place] [address] Dept: [phone] Loc: [place]  07/11/2019  You are scheduled for a Cardiac Catheterization on Thursday, September 3 with Dr. [name].  1. Please arrive at the [facility] (Main Entrance A) at  [facility]: [address] [place], [place] [address] at 7:00 AM (This time is two hours before your procedure to ensure your preparation). Free valet parking service is available.   Special note: Every effort is made to have your procedure done on time. Please understand that emergencies sometimes delay scheduled procedures.  2. Diet: Do not eat solid foods after midnight.  The patient may have clear liquids until 5am upon the day of the procedure.  3. Labs: COVID TEST TODAY - [address]  4. Medication instructions in preparation for your procedure:  - START TAKING 81  MG ASPIRIN UNTIL CATH    Stop taking Xarelto (Rivaroxaban) on Tuesday, August 31.  On the morning of your procedure, take your Aspirin  325 MG and any morning medicines NOT listed above.  You may use sips of water.  5. Plan for one night stay--bring personal belongings. 6. Bring a current list of your medications and current insurance cards. 7. You MUST have a responsible person to drive you home. 8. Someone MUST be with you the first 24 hours after you arrive home or your discharge will be delayed. 9. Please wear clothes that are easy to get on and off and wear slip-on shoes.  Thank you for allowing [place] to care for you!   -- [facility]      Studies Ordered:   Orders Placed This Encounter  Procedures   EKG 12-Lead   LEFT AND RIGHT HEART CATHETERIZATION WITH CORONARY Illene Silver, M.D., M.S. Interventional Cardiologist   Pager # [phone] Phone # [phone] [address] [place], [place] [address]   Thank you for choosing Heartcare at [facility]!!

## 2019-07-11 NOTE — Patient Instructions (Addendum)
Medication Instructions:  - STOP TAKING DILTIAZEM  - START  CREG ( CARVEDILOL) 3.125 MG  ONE TABLET TWICE A DAY   - START TAKING SPIRONOLACTONE   12.5 MG ( 1/2 TABLET )  ONCE DAILY   -INCREASE LASIX ( FUROSEMIDE ) 40 MG  DAILY  - ( FOR THE NEXT 3 DAYS TAKE TWICE A DAY )  - LAST DOSE OF XARELTO  WIL BE TONIGHT UNTIL YOU HAVE YOUR CATH ON Thursday   If you need a refill on your cardiac medications before your next appointment, please call your pharmacy.   Lab work:  COVID TEST TODAY   FOLLOW INSTRUCTION ON ENCLOSED LETTER I  Testing/Procedures: WILL BE SCHEDULE AT SEPT 3 , 2020 - AT [facility] AT 7 AM  WILL NEED TO HAVE COVID TEST  TODAY - 2:10 PM - [place]  Your physician has requested that you have a  RIGHT  AND LEFT  cardiac catheterization. Cardiac catheterization is used to diagnose and/or treat various heart conditions. Doctors may recommend this procedure for a number of different reasons. The most common reason is to evaluate chest pain. Chest pain can be a symptom of coronary artery disease (CAD), and cardiac catheterization can show whether plaque is narrowing or blocking your heart's arteries. This procedure is also used to evaluate the valves, as well as measure the blood flow and oxygen levels in different parts of your heart. For further information please visit HugeFiesta.[email or web address]. Please follow instruction sheet, as given.     Follow-Up: At [facility], you and your health needs are our priority.  As part of our continuing mission to provide you with exceptional heart care, we have created designated Provider Care Teams.  These Care Teams include your primary Cardiologist (physician) and Advanced Practice Providers (APPs -  Physician Assistants and Nurse Practitioners) who all work together to provide you with the care you need, when you need it. . You will need a follow up appointment in  2-3 WEEKS .  Please call our office 2 months in advance to  schedule this appointment.  You may see [name], MD or one of the following Advanced Practice Providers on your designated Care Team:   . Rosaria Ferries, PA-C . Jory Sims, DNP, ANP  Any Other Special Instructions Will Be Listed Below .      [place] [address] Dept: [phone] Loc: [place]  07/11/2019  You are scheduled for a Cardiac Catheterization on Thursday, September 3 with Dr. [name].  1. Please arrive at the [facility] (Main Entrance A) at [facility]: [address] [place], [place] [address] at 7:00 AM (This time is two hours before your procedure to ensure your preparation). Free valet parking service is available.   Special note: Every effort is made to have your procedure done on time. Please understand that emergencies sometimes delay scheduled procedures.  2. Diet: Do not eat solid foods after midnight.  The patient may have clear liquids until 5am upon the day of the procedure.  3. Labs: COVID TEST TODAY - [address]  4. Medication instructions in preparation for your procedure:  - START TAKING 81  MG ASPIRIN UNTIL CATH    Stop taking Xarelto (Rivaroxaban) on Tuesday, August 31.  On the morning of your procedure, take your Aspirin  325 MG and any morning  medicines NOT listed above.  You may use sips of water.  5. Plan for one night stay--bring personal belongings. 6. Bring a current list of your medications and current insurance cards. 7. You MUST have a responsible person to drive you home. 8. Someone MUST be with you the first 24 hours after you arrive home or your discharge will be delayed. 9. Please wear clothes that are easy to get on and off and wear slip-on shoes.  Thank you for allowing [place] to care for you!   -- [facility]

## 2019-07-11 NOTE — Progress Notes (Signed)
PCP: Camille Bal, PA-C  Clinic Note: Chief Complaint  Patient presents with   Follow-up    Post Echo. Coughing late in the eveing or at night.   Headache   Shortness of Breath    Every night while laying in bed. - sleeps sitting up    Chest Pain    LLQ pain    HPI: Randall Best is a 68 y.o. male with a PMH notable for persistent PAF (controlled with diltiazem) who presents today for follow-up of recent abnormal echocardiogram performed to evaluate progressive dyspnea --suggesting dilated Cardama.  ERIS HALTEMAN was last seen on 05/09/2019 noting episodes of "smothering spells at night while sleeping that time really only noted dyspnea with significant exertion.  He noted mild swelling but controlled with support stockings.  This is a chronic issue for him.  It had improved since backing off diltiazem and taking daily Lasix  Recent Hospitalizations: none  Studies Personally Reviewed - (if available, images/films reviewed: From Epic Chart or Care Everywhere)  Echo 07/05/2019: Severely reduced function with diffuse hypokinesis and septal dyskinesis.  EF <20%.  Cannot exclude LV thrombus.  Reduced RV function with mildly elevated RV P--37 mmHg.  Moderate RA and mild LA dilation.  A centimeter dilation 42 mm  Interval History: Clayden presents here today indicating that since I last saw him, he still is having significant smothering episodes at night difficulty breathing.  He is now having to sleep pretty much upright in the chair.  He is got a worsening cough, dry and nonproductive.  He is also been noticing a little bit of edema right greater than left but that is pretty stable.  He has been noticing worsening exertional dyspnea and fatigue.  Energy intolerance.  He has occasionally had some tightness and pressure when he does exertion.  Sometimes he feels that with numbness in his left arm.  He did note that when he first started taking the digoxin, the symptoms have  improved a little bit.  Review of REMAINING CARDIAC SYMPTOMS: No palpitations, lightheadedness, dizziness, weakness or syncope/near syncope. No TIA/amaurosis fugax symptoms. No melena, hematochezia, hematuria, or epstaxis. No claudication.  ROS: A comprehensive was performed. Review of Systems  Constitutional: Positive for malaise/fatigue. Negative for weight loss (Weight is actually gone up).  HENT: Negative for congestion and nosebleeds.   Respiratory: Positive for cough and shortness of breath. Negative for sputum production and wheezing.   Gastrointestinal: Positive for abdominal pain (Left lower quadrant pain). Negative for heartburn and nausea.  Genitourinary: Negative for hematuria.  Musculoskeletal: Negative for joint pain.  Neurological: Positive for dizziness, weakness (Global) and headaches. Negative for focal weakness.  Psychiatric/Behavioral: Negative for memory loss. The patient has insomnia (Not really insomnia, just poor sleep). The patient is not nervous/anxious.   All other systems reviewed and are negative.  The patient does not have symptoms concerning for COVID-19 infection (fever, chills, cough, or new shortness of breath).  The patient is practicing social distancing.   COVID-19 Education: The signs and symptoms of COVID-19 were discussed with the patient and how to seek care for testing (follow up with PCP or arrange E-visit).   The importance of social distancing was discussed today.   I have reviewed and (if needed) personally updated the patient's problem list, medications, allergies, past medical and surgical history, social and family history.   Past Medical History:  Diagnosis Date   Asthma    as child   Atrial fibrillation, rapid ([place]) 12/08/2017  Diagnosed during colonoscopy -presumably new onset   GERD (gastroesophageal reflux disease)    use meds prn   Glaucoma    Headache    remote h/o migraines, none in years   Hyperlipidemia     TIA (transient ischemic attack) 01/13/2018   Ventral hernia     Past Surgical History:  Procedure Laterality Date   CARDIAC EVENT MONITOR  12/2016   showed persistent Afib -monitor return in early because of persistent A. fib.  Several episodes of severe RVR with rates in the 170s were noted.    CARDIOVERSION N/A 01/13/2018   Procedure: CARDIOVERSION;  Surgeon: Pixie Casino, MD;  Location: [facility] ENDOSCOPY;  Service: Cardiovascular;  Laterality: N/A; unsuccessful.-->  Post-cardioversion, the patient had left-sided facial numbness and weakness.  Ruled out for stroke.   INSERTION OF MESH N/A 07/22/2017   Procedure: INSERTION OF MESH;  Surgeon: [name], MD;  Location: [place];  Service: General;  Laterality: N/A;  BILATERAL TAP BLOCK   NM MYOVIEW LTD  12/2017   Shows reduced EF, but no evidence of ischemia or infarction.  EF 40 and 45%.  INTERMEDIATE RISK due to reduced function.  EF notably better on echo (50 and 55%)   TEE WITHOUT CARDIOVERSION N/A 01/13/2018   Procedure: TRANSESOPHAGEAL ECHOCARDIOGRAM (TEE) with cardioversion - ;  Surgeon: Pixie Casino, MD;  Location: [facility] ENDOSCOPY;  Service: Cardiovascular;  Laterality: N/A;  Mild concentric LVH.  EF 55 to 60%.  No R WMA.  Mild ascending aortic dilation of 4.2 cm.  Dilated left atrium.  No thrombus.   TONSILLECTOMY     TRANSTHORACIC ECHOCARDIOGRAM  12/2017    EF 50 and 55%.  No regional wall motion normalities.   TRANSTHORACIC ECHOCARDIOGRAM  06/2019    Severely reduced function with diffuse hypokinesis and septal dyskinesis.  EF <20%.  Cannot exclude LV thrombus.  Reduced RV function with mildly elevated RV P--37 mmHg.  Moderate RA and mild LA dilation.  A centimeter dilation 42 mm   VENTRAL HERNIA REPAIR N/A 07/22/2017   Procedure: OPEN VENTRAL HERNIA REPAIR WITH MESH ERAS PATHWAY;  Surgeon: [name], MD;  Location: [place];  Service: General;  Laterality: N/A;  BILATERAL TAP BLOCK    Current Meds  Medication Sig     acetaminophen (TYLENOL) 325 MG tablet Take 650 mg by mouth every 6 (six) hours as needed.   albuterol (VENTOLIN HFA) 108 (90 Base) MCG/ACT inhaler Inhale 1-2 puffs into the lungs every 6 (six) hours as needed for wheezing or shortness of breath.   Cholecalciferol (VITAMIN D3) 5000 units TABS Take 5,000 Units by mouth daily.    digoxin (LANOXIN) 0.125 MG tablet Take 1 tablet (0.125 mg total) by mouth daily.   hydrocortisone 2.5 % cream Apply 1 application topically 2 (two) times daily as needed (skin irritation/rash).    Omega-3 Fatty Acids (FISH OIL) 1200 MG CAPS Take 1,200 mg by mouth daily.   rivaroxaban (XARELTO) 20 MG TABS tablet TAKE 1 TABLET (20 MG TOTAL) BY MOUTH DAILY WITH SUPPER. (Patient taking differently: Take 20 mg by mouth daily with supper. )   Specialty Vitamins Products (ULTRA MAN PO) Take 1 tablet by mouth daily.    [DISCONTINUED] diltiazem (CARDIZEM CD) 120 MG 24 hr capsule Take 1 capsule (120 mg total) by mouth daily.   [DISCONTINUED] diltiazem (CARDIZEM) 60 MG tablet Take 60 mg tablet by mouth as needed for sustain heartrate >110 .   [DISCONTINUED] furosemide (LASIX) 20 MG tablet Take 20 mg  By mouth daily , may take an additional 20 mg tablet if needed for swelling daily   [DISCONTINUED] gentamicin cream (GARAMYCIN) 0.1 % Apply 1 application topically 3 (three) times daily.    Allergies  Allergen Reactions   Ezetimibe Other (See Comments)    Broke out in sores/ muscle pain   Omeprazole Nausea And Vomiting   Statins Other (See Comments)    Muscle pains, mouth sores    Social History   Tobacco Use   Smoking status: Never Smoker   Smokeless tobacco: Never Used  Substance Use Topics   Alcohol use: Yes    Comment: Rarely   Drug use: No   Social History   Social History Narrative   He is a married father of 85, grandfather of 1.  Lives with his wife.   He does exercise regularly most days of the week for about 30 minutes.  He walks about 1-3  miles a day.  He will occasionally also walk on the treadmill.  He notes his energy is better if he remains active.  --His wife is finishing up his treatment for breast cancer with chemotherapy radiation.  T.  family history includes CVA in his maternal grandmother; Diabetes in his mother; Heart failure in his father; Hypertension in his brother.  Wt Readings from Last 3 Encounters:  07/11/19 213 lb (96.6 kg)  05/09/19 209 lb (94.8 kg)  03/07/19 216 lb (98 kg)    PHYSICAL EXAM BP 124/78 (BP Location: Left Arm, Patient Position: Sitting, Cuff Size: Normal)    Pulse 83    Temp (!) 97.2 F (36.2 C)    Ht 5\' 9"  (1.753 m)    Wt 213 lb (96.6 kg)    BMI 31.45 kg/m  Physical Exam  Constitutional: He is oriented to person, place, and time. He appears well-developed and well-nourished.  HENT:  Head: Normocephalic and atraumatic.  Eyes: EOM are normal.  Neck: Normal range of motion. Neck supple. No hepatojugular reflux and no JVD present. Carotid bruit is not present.  Cardiovascular: Normal rate, normal heart sounds and intact distal pulses. An irregularly irregular rhythm present.  No extrasystoles are present. PMI is not displaced. Exam reveals no gallop and no friction rub.  No murmur heard. Split S2  Pulmonary/Chest: Effort normal and breath sounds normal. No respiratory distress. He has no wheezes. He has no rales.  Abdominal: Soft. Bowel sounds are normal. He exhibits no distension. There is no abdominal tenderness.  Musculoskeletal: Normal range of motion.        General: Edema (Trivial) present.     Comments: Significant left greater than right swollen dilated varicose veins.  Neurological: He is alert and oriented to person, place, and time.  Skin:  Mild bruising.  Spider veins on legs.  Psychiatric: His behavior is normal. Judgment and thought content normal.  Vitals reviewed.    Adult ECG Report  Rate: 83 ;  Rhythm: atrial fibrillation and LBBB (new).  Normal rate & axis.  ;    Narrative Interpretation: New LBBB   Other studies Reviewed: Additional studies/ records that were reviewed today include:  Recent Labs:   Lab Results  Component Value Date   CREATININE 0.96 01/06/2018   BUN 15 01/06/2018   NA 142 01/06/2018   K 4.4 01/06/2018   CL 103 01/06/2018   CO2 24 01/06/2018   Lab Results  Component Value Date   CHOL  213 (H)  07/06/2019   HDL  43  07/06/2019  [place] 147 (H)  07/06/2019   TRIG 116  07/06/2019    ASSESSMENT / PLAN: Problem List Items Addressed This Visit    Venous insufficiency of left>>right lower extremity (Chronic)    Discussed benefit of support stockings.  We can probably given prescription for support stockings to wear most notably on the left leg but could also on both sides.      Relevant Medications   carvedilol (COREG) 3.125 MG tablet   spironolactone (ALDACTONE) 25 MG tablet   furosemide (LASIX) 40 MG tablet   Persistent atrial fibrillation ([place]): CHA2DS2-VASc Score 2 - Primary (Chronic)    Persistent A. fib.  Had been relatively well rate controlled with diltiazem, however we want to switch from diltiazem to a beta-blocker with reduced EF. Plan: Continue digoxin.  (Digoxin levels were below therapeutic)  DC digoxin, start carvedilol 3.125 mg twice daily  Continue Xarelto, but will need to hold for cath      Relevant Medications   carvedilol (COREG) 3.125 MG tablet   spironolactone (ALDACTONE) 25 MG tablet   furosemide (LASIX) 40 MG tablet   Other Relevant Orders   EKG 12-Lead   LEFT AND RIGHT HEART CATHETERIZATION WITH CORONARY ANGIOGRAM   Orthopnea (Chronic)    Most likely consistent with combined systolic and diastolic heart failure. Plan: Increase furosemide 40 mg twice daily until cath, start spironolactone 12.5 mg daily. Right and left heart catheterization.      Hyperlipidemia (Chronic)    Most recent LDL is 147.  Total cholesterol 213.  He has been pretty much intolerant of statins in the past.   Depending on what the cardiac cath shows.  Anticipate that we will probably need to consider referral for PCSK9 inhibitor.      Relevant Medications   carvedilol (COREG) 3.125 MG tablet   spironolactone (ALDACTONE) 25 MG tablet   furosemide (LASIX) 40 MG tablet   Exertional dyspnea (Chronic)    Worsening exertional dyspnea now with chest discomfort.  With severely reduced EF, will now proceed with Right & Left Heart Catheterization and possible PCI.      Relevant Orders   EKG 12-Lead   LEFT AND RIGHT HEART CATHETERIZATION WITH CORONARY ANGIOGRAM   Exertional chest pain    This the first time he, mentioned having exertional chest pain.  Previous Myoview about 18 months ago was negative, now he is describing pressure and tightness associated with exertion but not the sharp twinging pain he had before.  With severely reduced EF and poorly controlled lipids, cannot exclude ischemic cardiomyopathy and ischemic chest pain. Plan: Right and Left Heart Catheterization with possible PCI. Start aspirin 81 mg daily and take 324 mg morning of cath. We will have to hold Xarelto for cath.      Dilated cardiomyopathy ([place])    New diagnosis of severely reduced EF, somewhat exacerbated probably by new left bundle branch block which seems to be progression of interventricular conduction delay PVC noted.  The combination of the LBBB and A. fib is probably contributing to his reduced EF.  He is also now noted significant exertional dyspnea and chest discomfort. Plan: Right and LEFT HEART CATHETERIZATION WITH CORONARY ANGIOGRAPHY and POSSIBLE PERCUTANEOUS CORONARY INTERVENTION  Convert from diltiazem to carvedilol 3.25 mg twice daily  Start spironolactone 12.5 mg daily  He does have a about a 4 pound weight gain since June. -->  Increase furosemide to 40 mg twice daily until cath.  Continue digoxin  Depending on blood pressure, consider Entresto  Relevant Medications   carvedilol (COREG) 3.125  MG tablet   spironolactone (ALDACTONE) 25 MG tablet   furosemide (LASIX) 40 MG tablet   Other Relevant Orders   EKG 12-Lead   LEFT AND RIGHT HEART CATHETERIZATION WITH CORONARY ANGIOGRAM      I spent a total of 30 minutes with the patient and chart review. >  50% of the time was spent in direct patient consultation.  Detailed explanation of the echocardiogram results, changes in therapy as well as explanation of right and left heart catheterization and possible PCI.  The procedure with Risks/Benefits/Alternatives and Indications was reviewed with the patient.  All questions were answered.    Risks / Complications include, but not limited to: Death, MI, CVA/TIA, VF/VT (with defibrillation), Bradycardia (need for temporary pacer placement), contrast induced nephropathy, bleeding / bruising / hematoma / pseudoaneurysm, vascular or coronary injury (with possible emergent CT or Vascular Surgery), adverse medication reactions, infection.  Additional risks involving the use of radiation with the possibility of radiation burns and cancer were explained in detail.  The patient voices understanding and agree to proceed.     Current medicines are reviewed at length with the patient today.  (+/- concerns) none The following changes have been made:  See below  Patient Instructions  Medication Instructions:  - STOP TAKING DILTIAZEM  - START  CREG ( CARVEDILOL) 3.125 MG  ONE TABLET TWICE A DAY   - START TAKING SPIRONOLACTONE   12.5 MG ( 1/2 TABLET )  ONCE DAILY   -INCREASE LASIX ( FUROSEMIDE ) 40 MG  DAILY  - ( FOR THE NEXT 3 DAYS TAKE TWICE A DAY )  - LAST DOSE OF XARELTO  WIL BE TONIGHT UNTIL YOU HAVE YOUR CATH ON Thursday   If you need a refill on your cardiac medications before your next appointment, please call your pharmacy.   Lab work:  COVID TEST TODAY   FOLLOW INSTRUCTION ON ENCLOSED LETTER I  Testing/Procedures: WILL BE SCHEDULE AT SEPT 3 , 2020 - AT [place] AT 7  AM  WILL NEED TO HAVE COVID TEST  TODAY - 2:10 PM - [place]  Your physician has requested that you have a  RIGHT  AND LEFT  cardiac catheterization. Cardiac catheterization is used to diagnose and/or treat various heart conditions. Doctors may recommend this procedure for a number of different reasons. The most common reason is to evaluate chest pain. Chest pain can be a symptom of coronary artery disease (CAD), and cardiac catheterization can show whether plaque is narrowing or blocking your hearts arteries. This procedure is also used to evaluate the valves, as well as measure the blood flow and oxygen levels in different parts of your heart. For further information please visit [email or web address][email or web address]. Please follow instruction sheet, as given.     Follow-Up: At [facility], you and your health needs are our priority.  As part of our continuing mission to provide you with exceptional heart care, we have created designated Provider Care Teams.  These Care Teams include your primary Cardiologist (physician) and Advanced Practice Providers (APPs -  Physician Assistants and Nurse Practitioners) who all work together to provide you with the care you need, when you need it.  You will need a follow up appointment in  2-3 WEEKS .  Please call our office 2 months in advance to schedule this appointment.  You may see [name], MD or one of the following Advanced Practice Providers on your  designated Care Team:    Rosaria Ferries, PA-C  Jory Sims, DNP, ANP  Any Other Special Instructions Will Be Listed Below .      [place] [address] Dept: [phone] Loc: [place]  07/11/2019  You are scheduled for a Cardiac Catheterization on Thursday, September 3 with Dr. [name].  1. Please arrive at the [facility] (Main Entrance A) at  [facility]: [address] [place], [place] [address] at 7:00 AM (This time is two hours before your procedure to ensure your preparation). Free valet parking service is available.   Special note: Every effort is made to have your procedure done on time. Please understand that emergencies sometimes delay scheduled procedures.  2. Diet: Do not eat solid foods after midnight.  The patient may have clear liquids until 5am upon the day of the procedure.  3. Labs: COVID TEST TODAY - [address]  4. Medication instructions in preparation for your procedure:  - START TAKING 81  MG ASPIRIN UNTIL CATH    Stop taking Xarelto (Rivaroxaban) on Tuesday, August 31.  On the morning of your procedure, take your Aspirin  325 MG and any morning medicines NOT listed above.  You may use sips of water.  5. Plan for one night stay--bring personal belongings. 6. Bring a current list of your medications and current insurance cards. 7. You MUST have a responsible person to drive you home. 8. Someone MUST be with you the first 24 hours after you arrive home or your discharge will be delayed. 9. Please wear clothes that are easy to get on and off and wear slip-on shoes.  Thank you for allowing [place] to care for you!   -- [place]      Studies Ordered:   Orders Placed This Encounter  Procedures   EKG 12-Lead   LEFT AND RIGHT HEART CATHETERIZATION WITH CORONARY Illene Silver, M.D., M.S. Interventional Cardiologist   Pager # [phone] Phone # [phone] [address] [place], [place] [address]   Thank you for choosing Heartcare at [facility]!!

## 2019-07-11 NOTE — Assessment & Plan Note (Addendum)
Persistent A. fib.  Had been relatively well rate controlled with diltiazem, however we want to switch from diltiazem to a beta-blocker with reduced EF. Plan: Continue digoxin.  (Digoxin levels were below therapeutic)  DC digoxin, start carvedilol 3.125 mg twice daily  Continue Xarelto, but will need to hold for cath

## 2019-07-11 NOTE — Assessment & Plan Note (Signed)
New diagnosis of severely reduced EF, somewhat exacerbated probably by new left bundle branch block which seems to be progression of interventricular conduction delay PVC noted.  The combination of the LBBB and A. fib is probably contributing to his reduced EF.  He is also now noted significant exertional dyspnea and chest discomfort. Plan: Right and LEFT HEART CATHETERIZATION WITH CORONARY ANGIOGRAPHY and POSSIBLE PERCUTANEOUS CORONARY INTERVENTION  Convert from diltiazem to carvedilol 3.25 mg twice daily  Start spironolactone 12.5 mg daily  He does have a about a 4 pound weight gain since June. -->  Increase furosemide to 40 mg twice daily until cath.  Continue digoxin  Depending on blood pressure, consider Entresto

## 2019-07-11 NOTE — Assessment & Plan Note (Signed)
Most recent LDL is 147.  Total cholesterol 213.  He has been pretty much intolerant of statins in the past.  Depending on what the cardiac cath shows.  Anticipate that we will probably need to consider referral for PCSK9 inhibitor.

## 2019-07-11 NOTE — Assessment & Plan Note (Addendum)
Worsening exertional dyspnea now with chest discomfort.  With severely reduced EF, will now proceed with Right & Left Heart Catheterization and possible PCI.

## 2019-07-11 NOTE — Assessment & Plan Note (Signed)
Discussed benefit of support stockings.  We can probably given prescription for support stockings to wear most notably on the left leg but could also on both sides.

## 2019-07-11 NOTE — Assessment & Plan Note (Signed)
Most likely consistent with combined systolic and diastolic heart failure. Plan: Increase furosemide 40 mg twice daily until cath, start spironolactone 12.5 mg daily. Right and left heart catheterization.

## 2019-07-13 ENCOUNTER — Other Ambulatory Visit: Payer: Self-pay

## 2019-07-13 ENCOUNTER — Telehealth: Payer: Self-pay | Admitting: *Deleted

## 2019-07-13 NOTE — Telephone Encounter (Addendum)
Pt contacted pre-catheterization scheduled at [facility] for: Thursday September 3,2020 9 AM Verified arrival time and place: [place] [facility]) at: 7 AM [facility] 07/06/19 scanned in Epic/needs CBC-pt aware  No solid food after midnight prior to cath, clear liquids until 5 AM day of procedure. Contrast allergy: no  Hold: Lasix-AM of procedure. Spironolactone-AM of procedure. Xarelto-last dose 07/11/19 until post procedure.  Except hold medications AM meds can be  taken pre-cath with sip of water including: ASA 81 mg (4)-per Dr Ellyn Hack    Confirmed patient has responsible person to drive home post procedure and observe 24 hours after arriving home: yes  Currently, due to Covid-19 pandemic, only one support person will be allowed with patient. Must be the same support person for that patient's entire stay, will be screened and required to wear a mask. They will be asked to wait in the waiting room for the duration of the patient's stay.  Patients are required to wear a mask when they enter the hospital.      COVID-19 Pre-Screening Questions:  . In the past 7 to 10 days have you had a cough,  shortness of breath, headache, congestion, fever (100 or greater) body aches, chills, sore throat, or sudden loss of taste or sense of smell? Shortness of breath/cough-not new-from at fib per pt. . Have you been around anyone with known Covid 19? no . Have you been around anyone who is awaiting Covid 19 test results in the past 7 to 10 days? no . Have you been around anyone who has been exposed to Covid 19, or has mentioned symptoms of Covid 19 within the past 7 to 10 days? no   I reviewed procedure/mask/visitor, Covid-19 screening questions with patient, he verbalized understanding, thanked me for call.

## 2019-07-14 ENCOUNTER — Encounter (HOSPITAL_COMMUNITY)
Admission: RE | Disposition: A | Discharge status: Home / Self Care | Payer: BC Managed Care – PPO | Source: Home / Self Care | Attending: Cardiology

## 2019-07-14 ENCOUNTER — Other Ambulatory Visit: Payer: Self-pay

## 2019-07-14 ENCOUNTER — Ambulatory Visit (HOSPITAL_COMMUNITY)
Admission: RE | Admit: 2019-07-14 | Discharge: 2019-07-14 | Disposition: A | Discharge status: Home / Self Care | Payer: BC Managed Care – PPO | Attending: Cardiology | Admitting: Cardiology

## 2019-07-14 ENCOUNTER — Encounter (HOSPITAL_COMMUNITY): Payer: Self-pay | Admitting: Cardiology

## 2019-07-14 DIAGNOSIS — I4819 Other persistent atrial fibrillation: Secondary | ICD-10-CM

## 2019-07-14 DIAGNOSIS — H409 Unspecified glaucoma: Secondary | ICD-10-CM | POA: Diagnosis not present

## 2019-07-14 DIAGNOSIS — R079 Chest pain, unspecified: Secondary | ICD-10-CM | POA: Diagnosis not present

## 2019-07-14 DIAGNOSIS — I420 Dilated cardiomyopathy: Secondary | ICD-10-CM | POA: Diagnosis not present

## 2019-07-14 DIAGNOSIS — Z888 Allergy status to other drugs, medicaments and biological substances status: Secondary | ICD-10-CM | POA: Insufficient documentation

## 2019-07-14 DIAGNOSIS — K219 Gastro-esophageal reflux disease without esophagitis: Secondary | ICD-10-CM | POA: Diagnosis not present

## 2019-07-14 DIAGNOSIS — R0609 Other forms of dyspnea: Secondary | ICD-10-CM | POA: Diagnosis not present

## 2019-07-14 DIAGNOSIS — Z8673 Personal history of transient ischemic attack (TIA), and cerebral infarction without residual deficits: Secondary | ICD-10-CM | POA: Insufficient documentation

## 2019-07-14 DIAGNOSIS — I519 Heart disease, unspecified: Secondary | ICD-10-CM | POA: Insufficient documentation

## 2019-07-14 DIAGNOSIS — I4821 Permanent atrial fibrillation: Secondary | ICD-10-CM | POA: Insufficient documentation

## 2019-07-14 DIAGNOSIS — E785 Hyperlipidemia, unspecified: Secondary | ICD-10-CM | POA: Diagnosis not present

## 2019-07-14 DIAGNOSIS — Z7901 Long term (current) use of anticoagulants: Secondary | ICD-10-CM | POA: Diagnosis not present

## 2019-07-14 DIAGNOSIS — Z823 Family history of stroke: Secondary | ICD-10-CM | POA: Diagnosis not present

## 2019-07-14 DIAGNOSIS — Z8249 Family history of ischemic heart disease and other diseases of the circulatory system: Secondary | ICD-10-CM | POA: Insufficient documentation

## 2019-07-14 HISTORY — PX: RIGHT/LEFT HEART CATH AND CORONARY ANGIOGRAPHY: CATH118266

## 2019-07-14 LAB — POCT I-STAT EG7
Bicarbonate: 25.8 mmol/L (ref 20.0–28.0)
Bicarbonate: 25.8 mmol/L (ref 20.0–28.0)
Calcium, Ion: 1.19 mmol/L (ref 1.15–1.40)
Calcium, Ion: 1.21 mmol/L (ref 1.15–1.40)
HCT: 46 % (ref 39.0–52.0)
HCT: 46 % (ref 39.0–52.0)
Hemoglobin: 15.6 g/dL (ref 13.0–17.0)
Hemoglobin: 15.6 g/dL (ref 13.0–17.0)
O2 Saturation: 70 %
O2 Saturation: 72 %
Potassium: 3.9 mmol/L (ref 3.5–5.1)
Potassium: 3.9 mmol/L (ref 3.5–5.1)
Sodium: 141 mmol/L (ref 135–145)
Sodium: 141 mmol/L (ref 135–145)
TCO2: 27 mmol/L (ref 22–32)
TCO2: 27 mmol/L (ref 22–32)
pCO2, Ven: 45.4 mmHg (ref 44.0–60.0)
pCO2, Ven: 45.9 mmHg (ref 44.0–60.0)
pH, Ven: 7.358 (ref 7.250–7.430)
pH, Ven: 7.362 (ref 7.250–7.430)
pO2, Ven: 38 mmHg (ref 32.0–45.0)
pO2, Ven: 40 mmHg (ref 32.0–45.0)

## 2019-07-14 LAB — CBC
HCT: 46.7 % (ref 39.0–52.0)
Hemoglobin: 15.5 g/dL (ref 13.0–17.0)
MCH: 31 pg (ref 26.0–34.0)
MCHC: 33.2 g/dL (ref 30.0–36.0)
MCV: 93.4 fL (ref 80.0–100.0)
Platelets: 239 10*3/uL (ref 150–400)
RBC: 5 MIL/uL (ref 4.22–5.81)
RDW: 14 % (ref 11.5–15.5)
WBC: 7.8 10*3/uL (ref 4.0–10.5)
nRBC: 0 % (ref 0.0–0.2)

## 2019-07-14 LAB — POCT I-STAT 7, (LYTES, BLD GAS, ICA,H+H)
Acid-base deficit: 1 mmol/L (ref 0.0–2.0)
Bicarbonate: 23.4 mmol/L (ref 20.0–28.0)
Calcium, Ion: 1.22 mmol/L (ref 1.15–1.40)
HCT: 46 % (ref 39.0–52.0)
Hemoglobin: 15.6 g/dL (ref 13.0–17.0)
O2 Saturation: 98 %
Potassium: 3.9 mmol/L (ref 3.5–5.1)
Sodium: 141 mmol/L (ref 135–145)
TCO2: 25 mmol/L (ref 22–32)
pCO2 arterial: 38.9 mmHg (ref 32.0–48.0)
pH, Arterial: 7.387 (ref 7.350–7.450)
pO2, Arterial: 116 mmHg — ABNORMAL HIGH (ref 83.0–108.0)

## 2019-07-14 LAB — GLUCOSE, CAPILLARY: Glucose-Capillary: 151 mg/dL — ABNORMAL HIGH (ref 70–99)

## 2019-07-14 SURGERY — RIGHT/LEFT HEART CATH AND CORONARY ANGIOGRAPHY
Anesthesia: LOCAL

## 2019-07-14 MED ORDER — ONDANSETRON HCL 4 MG/2ML IJ SOLN: 4.0000 mg | Freq: Four times a day (QID) | INTRAMUSCULAR | Status: DC | PRN

## 2019-07-14 MED ORDER — SODIUM CHLORIDE 0.9% FLUSH: 3.0000 mL | INTRAVENOUS | Status: DC | PRN

## 2019-07-14 MED ORDER — FENTANYL CITRATE (PF) 100 MCG/2ML IJ SOLN
INTRAMUSCULAR | Status: DC | PRN
  Administered 2019-07-14: 25 ug via INTRAVENOUS

## 2019-07-14 MED ORDER — FENTANYL CITRATE (PF) 100 MCG/2ML IJ SOLN
INTRAMUSCULAR | Status: AC
  Filled 2019-07-14: qty 2

## 2019-07-14 MED ORDER — HEPARIN SODIUM (PORCINE) 1000 UNIT/ML IJ SOLN
INTRAMUSCULAR | Status: AC
  Filled 2019-07-14: qty 1

## 2019-07-14 MED ORDER — HEPARIN SODIUM (PORCINE) 1000 UNIT/ML IJ SOLN
INTRAMUSCULAR | Status: DC | PRN
  Administered 2019-07-14: 5000 [IU] via INTRAVENOUS

## 2019-07-14 MED ORDER — VERAPAMIL HCL 2.5 MG/ML IV SOLN
INTRAVENOUS | Status: DC | PRN
  Administered 2019-07-14: 10 mL via INTRA_ARTERIAL

## 2019-07-14 MED ORDER — SODIUM CHLORIDE 0.9% FLUSH: 3.0000 mL | Freq: Two times a day (BID) | INTRAVENOUS | Status: DC

## 2019-07-14 MED ORDER — LIDOCAINE HCL (PF) 1 % IJ SOLN
INTRAMUSCULAR | Status: DC | PRN
  Administered 2019-07-14 (×2): 2 mL

## 2019-07-14 MED ORDER — HEPARIN (PORCINE) IN NACL 1000-0.9 UT/500ML-% IV SOLN
INTRAVENOUS | Status: DC | PRN
  Administered 2019-07-14 (×2): 500 mL

## 2019-07-14 MED ORDER — SODIUM CHLORIDE 0.9 % IV SOLN
INTRAVENOUS | Status: DC
  Administered 2019-07-14: 08:00:00 via INTRAVENOUS

## 2019-07-14 MED ORDER — SODIUM CHLORIDE 0.9 % IV SOLN: 250.0000 mL | INTRAVENOUS | Status: DC | PRN

## 2019-07-14 MED ORDER — MIDAZOLAM HCL 2 MG/2ML IJ SOLN
INTRAMUSCULAR | Status: DC | PRN
  Administered 2019-07-14: 1 mg via INTRAVENOUS

## 2019-07-14 MED ORDER — ALBUTEROL SULFATE HFA 108 (90 BASE) MCG/ACT IN AERS: 1.0000 | INHALATION_SPRAY | Freq: Four times a day (QID) | RESPIRATORY_TRACT | Status: DC | PRN

## 2019-07-14 MED ORDER — HEPARIN (PORCINE) IN NACL 1000-0.9 UT/500ML-% IV SOLN
INTRAVENOUS | Status: AC
  Filled 2019-07-14: qty 1000

## 2019-07-14 MED ORDER — ACETAMINOPHEN 325 MG PO TABS: 650.0000 mg | ORAL_TABLET | ORAL | Status: DC | PRN

## 2019-07-14 MED ORDER — LIDOCAINE HCL (PF) 1 % IJ SOLN
INTRAMUSCULAR | Status: AC
  Filled 2019-07-14: qty 30

## 2019-07-14 MED ORDER — IOHEXOL 350 MG/ML SOLN
INTRAVENOUS | Status: DC | PRN
  Administered 2019-07-14: 45 mL

## 2019-07-14 MED ORDER — SODIUM CHLORIDE 0.9 % IV SOLN: INTRAVENOUS | Status: AC

## 2019-07-14 MED ORDER — MIDAZOLAM HCL 2 MG/2ML IJ SOLN
INTRAMUSCULAR | Status: AC
  Filled 2019-07-14: qty 2

## 2019-07-14 MED ORDER — VERAPAMIL HCL 2.5 MG/ML IV SOLN
INTRAVENOUS | Status: AC
  Filled 2019-07-14: qty 2

## 2019-07-14 SURGICAL SUPPLY — 13 items
CATH BALLN WEDGE 5F 110CM (CATHETERS) ×2 IMPLANT
CATH OPTITORQUE TIG 4.0 5F (CATHETERS) ×2 IMPLANT
DEVICE RAD COMP TR BAND LRG (VASCULAR PRODUCTS) ×2 IMPLANT
GLIDESHEATH SLEND SS 6F .021 (SHEATH) ×2 IMPLANT
GUIDEWIRE INQWIRE 1.5J.035X260 (WIRE) ×1 IMPLANT
INQWIRE 1.5J .035X260CM (WIRE) ×2
KIT HEART LEFT (KITS) ×2 IMPLANT
PACK CARDIAC CATHETERIZATION (CUSTOM PROCEDURE TRAY) ×2 IMPLANT
SHEATH GLIDE SLENDER 4/5FR (SHEATH) ×2 IMPLANT
SHEATH PROBE COVER 6X72 (BAG) ×2 IMPLANT
STOPCOCK MORSE 400PSI 3WAY (MISCELLANEOUS) ×2 IMPLANT
TRANSDUCER W/STOPCOCK (MISCELLANEOUS) ×2 IMPLANT
TUBING CIL FLEX 10 FLL-RA (TUBING) ×2 IMPLANT

## 2019-07-14 NOTE — Progress Notes (Signed)
Client up and walked and tolerated well; states headache resolved and no c/o chest discomfort

## 2019-07-14 NOTE — Progress Notes (Signed)
Client awakens only with verbal and tactile stimuli; c/o headache, alert and oriented when awakens; Dr Ellyn Hack notified of client being sleepy and c/o headache and per Dr Ellyn Hack, walk client and if stable may d/c home

## 2019-07-14 NOTE — Interval H&P Note (Signed)
History and Physical Interval Note:  07/14/2019 9:34 AM  Randall Best  has presented today for surgery, with the diagnosis of cardiomyopathy WITH CHEST PAIN.  The various methods of treatment have been discussed with the patient and family. After consideration of risks, benefits and other options for treatment, the patient has consented to  Procedure(s): RIGHT/LEFT HEART CATH AND CORONARY ANGIOGRAPHY (N/A)  PERCUTANEOUS CORONARY INTERVENTION  as a surgical intervention.  The patient's history has been reviewed, patient examined, no change in status, stable for surgery.  I have reviewed the patient's chart and labs.  Questions were answered to the patient's satisfaction.    Cath Lab Visit (complete for each Cath Lab visit)  Clinical Evaluation Leading to the Procedure:   ACS: No.  Non-ACS:    Anginal Classification: CCS III - NHYA CHF  Anti-ischemic medical therapy: Minimal Therapy (1 class of medications)  Non-Invasive Test Results: High-risk stress test findings: cardiac mortality >3%/year -echo with severely reduced EF   Prior CABG: No previous CABG   [name]

## 2019-07-14 NOTE — Discharge Instructions (Signed)
Radial Site Care ° °This sheet gives you information about how to care for yourself after your procedure. Your health care provider may also give you more specific instructions. If you have problems or questions, contact your health care provider. °What can I expect after the procedure? °After the procedure, it is common to have: °· Bruising and tenderness at the catheter insertion area. °Follow these instructions at home: °Medicines °· Take over-the-counter and prescription medicines only as told by your health care provider. °Insertion site care °· Follow instructions from your health care provider about how to take care of your insertion site. Make sure you: °? Wash your hands with soap and water before you change your bandage (dressing). If soap and water are not available, use hand sanitizer. °? Change your dressing as told by your health care provider. °? Leave stitches (sutures), skin glue, or adhesive strips in place. These skin closures may need to stay in place for 2 weeks or longer. If adhesive strip edges start to loosen and curl up, you may trim the loose edges. Do not remove adhesive strips completely unless your health care provider tells you to do that. °· Check your insertion site every day for signs of infection. Check for: °? Redness, swelling, or pain. °? Fluid or blood. °? Pus or a bad smell. °? Warmth. °· Do not take baths, swim, or use a hot tub until your health care provider approves. °· You may shower 24-48 hours after the procedure, or as directed by your health care provider. °? Remove the dressing and gently wash the site with plain soap and water. °? Pat the area dry with a clean towel. °? Do not rub the site. That could cause bleeding. °· Do not apply powder or lotion to the site. °Activity ° °· For 24 hours after the procedure, or as directed by your health care provider: °? Do not flex or bend the affected arm. °? Do not push or pull heavy objects with the affected arm. °? Do not  drive yourself home from the hospital or clinic. You may drive 24 hours after the procedure unless your health care provider tells you not to. °? Do not operate machinery or power tools. °· Do not lift anything that is heavier than 10 lb (4.5 kg), or the limit that you are told, until your health care provider says that it is safe. °· Ask your health care provider when it is okay to: °? Return to work or school. °? Resume usual physical activities or sports. °? Resume sexual activity. °General instructions °· If the catheter site starts to bleed, raise your arm and put firm pressure on the site. If the bleeding does not stop, get help right away. This is a medical emergency. °· If you went home on the same day as your procedure, a responsible adult should be with you for the first 24 hours after you arrive home. °· Keep all follow-up visits as told by your health care provider. This is important. °Contact a health care provider if: °· You have a fever. °· You have redness, swelling, or yellow drainage around your insertion site. °Get help right away if: °· You have unusual pain at the radial site. °· The catheter insertion area swells very fast. °· The insertion area is bleeding, and the bleeding does not stop when you hold steady pressure on the area. °· Your arm or hand becomes pale, cool, tingly, or numb. °These symptoms may represent a serious problem   that is an emergency. Do not wait to see if the symptoms will go away. Get medical help right away. Call your local emergency services (911 in the [place] Do not drive yourself to the hospital. Summary  After the procedure, it is common to have bruising and tenderness at the site.  Follow instructions from your health care provider about how to take care of your radial site wound. Check the wound every day for signs of infection.  Do not lift anything that is heavier than 10 lb (4.5 kg), or the limit that you are told, until your health care provider says  that it is safe. This information is not intended to replace advice given to you by your health care provider. Make sure you discuss any questions you have with your health care provider. Document Released: 11/29/2010 Document Revised: 12/02/2017 Document Reviewed: 12/02/2017 Elsevier Patient Education  2020 [place] for medications: Restart Xarelto p.m. 07/14/2019; digoxin in a.m. 07/15/2019. Take Lasix 40 mg daily starting 07/15/2019 with exception of 40 mg twice daily on 07/16/2019 and then Tuesdays and Thursdays going forward Also sliding scale Lasix as follows: Sliding scale Lasix: Weigh yourself when you get home (your dry weight should be 2 to 3 pounds lower than it is when you get home), then weigh yourself Daily in the Morning.  If you gain more than 3 pounds from dry weight: Increase the Lasix dosing to 40 mg twice a day (~8 AM a.m. and then ~2 PM) until weight returns to baseline dry weight.  If weight gain is greater than 5 pounds in 2 days: Increased to Lasix 40 mg in the morning, and 80 mg in the afternoon, he should and contact the office for further assistance if weight does not go down the next day.  If the weight goes down more than 3 pounds from dry weight: Hold Lasix until it returns to baseline dry weight

## 2019-07-15 DIAGNOSIS — Z23 Encounter for immunization: Secondary | ICD-10-CM | POA: Diagnosis not present

## 2019-07-29 ENCOUNTER — Ambulatory Visit (INDEPENDENT_AMBULATORY_CARE_PROVIDER_SITE_OTHER): Payer: BC Managed Care – PPO | Admitting: Cardiology

## 2019-07-29 ENCOUNTER — Other Ambulatory Visit: Payer: Self-pay

## 2019-07-29 ENCOUNTER — Encounter: Payer: Self-pay | Admitting: Cardiology

## 2019-07-29 VITALS — BP 103/76 | HR 78 | Temp 97.7°F | Ht 69.0 in | Wt 206.0 lb

## 2019-07-29 DIAGNOSIS — I4819 Other persistent atrial fibrillation: Secondary | ICD-10-CM

## 2019-07-29 DIAGNOSIS — I420 Dilated cardiomyopathy: Secondary | ICD-10-CM | POA: Diagnosis not present

## 2019-07-29 DIAGNOSIS — I5042 Chronic combined systolic (congestive) and diastolic (congestive) heart failure: Secondary | ICD-10-CM

## 2019-07-29 MED ORDER — FUROSEMIDE 40 MG PO TABS: 40.0000 mg | ORAL_TABLET | Freq: Two times a day (BID) | ORAL | 3 refills | Status: DC

## 2019-07-29 MED ORDER — POTASSIUM CHLORIDE CRYS ER 20 MEQ PO TBCR: 20.0000 meq | EXTENDED_RELEASE_TABLET | Freq: Every day | ORAL | 3 refills | Status: DC

## 2019-07-29 MED ORDER — SPIRONOLACTONE 25 MG PO TABS: 25.0000 mg | ORAL_TABLET | Freq: Every day | ORAL | 3 refills | Status: DC

## 2019-07-29 NOTE — Patient Instructions (Addendum)
Medication Instructions:  - INCREASE SPIRONOLACTONE 25 MG DAILY - LASIX ( FUROSEMIDE ) 40 MG TWICE A DAY , MAY TAKE AN ADDITIONAL TABLET IF INCREASE WEIGHT BY 3 LBS . -  START TAKING POTASSIUM 20 MEG  DAILY , IF YOU TAKE AN EXTRA FUROSEMIDE THEN  TAKE AN EXTRA POTASSIUM  If you need a refill on your cardiac medications before your next appointment, please call your pharmacy.   Lab work: NOT NEEDED   Testing/Procedures: Will be schedule in [facility] 2020 AT [place] [address] Your physician has requested that you have an echocardiogram. Echocardiography is a painless test that uses sound waves to create images of your heart. It provides your doctor with information about the size and shape of your heart and how well your heart's chambers and valves are working. This procedure takes approximately one hour. There are no restrictions for this procedure.    Follow-Up: At [facility], you and your health needs are our priority.  As part of our continuing mission to provide you with exceptional heart care, we have created designated Provider Care Teams.  These Care Teams include your primary Cardiologist (physician) and Advanced Practice Providers (APPs -  Physician Assistants and Nurse Practitioners) who all work together to provide you with the care you need, when you need it. . You will need a follow up appointment in [place] after echo.  Please call our office 2 months in advance to schedule this appointment.  You may see [name], MD or one of the following Advanced Practice Providers on your designated Care Team:   . Rosaria Ferries, PA-C . Jory Sims, DNP, ANP  Any Other Special Instructions Will Be Listed Below (If Applicable).

## 2019-07-29 NOTE — Progress Notes (Signed)
PCP: Camille Bal, PA-C  Clinic Note: Chief Complaint  Patient presents with   Hospitalization Follow-up    Post-cath follow-up   Cardiomyopathy    Nonischemic   Congestive Heart Failure    Class II-III combined systolic and diastolic   Atrial Fibrillation    Has been permanent unable to cardiovert    HPI: [place] Best is a 68 y.o. male with a PMH notable for persistent PAF with a newly documented LBBB who has subsequently developed dilated (non-ischemic)  Cardiomyopathy who presents for Post-Cath f/u.  [place] Best had been relatively stable with rate controlled A. fib using diltiazem.  However he took a turn for the worse over the summer and came in to be seen on 05/09/2019 for early f/u noting episodes of "smothering spells at night while sleeping that time really only noted dyspnea with significant exertion.  He noted mild swelling but controlled with support stockings.    -> EKG showed new LBBB --> He was evaluated with an Echocardiogram noting severely reduced LVEF < 20% with septal dyskinesis (consistent with LBBB).  Recent Hospitalizations: Cardiac cath on September 3  Studies Personally Reviewed - (if available, images/films reviewed: From Epic Chart or Care Everywhere)  R&LHC 07/14/2019: Angiographically normal coronary arteries. RHC  - PCWP & LVEDP 17 mmHg.  CO-CI 4.92-2.34  Started on Lasix 40 mg daily & BID on Tues/Thurs  Interval History: [place] Best presents for post-cath f/u.  He notes that he is indeed feeling better on new meds - but last 2 days more PND with persistent Orthopnea (sleeping sitting upright).  Still off & on dyspnea - but better & no edema.  NO chest pain or pressure with rest or exertion, but does note significant exercise intolerance with DOE & fatigue.   He says that overall things have improved with decreased dose of Lasix and digoxin.  Over the last couple days, however this seems to gotten little bit worse --he was not aware of  potential taking an additional dose of diuretic.    He has not really noted any orthostatic dizziness per se, but on occasion has felt a little lightheaded.  No sensation of being in A. fib such as any rapid irregular heartbeats.  No syncope/near syncope or TIA shows amaurosis fugax.  Review of REMAINING CARDIAC SYMPTOMS: No claudication  ROS: A comprehensive was performed. Review of Systems  Constitutional: Positive for malaise/fatigue (Exercise intolerance). Negative for weight loss (Weight is actually gone up).  HENT: Negative for congestion and nosebleeds.   Respiratory: Positive for cough (This seems to have improved with Lasix). Negative for sputum production and wheezing.   Gastrointestinal: Positive for abdominal pain (Left lower quadrant pain). Negative for blood in stool, constipation, heartburn, melena and nausea.  Genitourinary: Negative for hematuria.  Musculoskeletal: Negative for joint pain.  Neurological: Positive for dizziness. Negative for focal weakness, weakness (Global) and headaches.  Psychiatric/Behavioral: Negative for depression and memory loss. The patient has insomnia (Not really insomnia, just poor sleep). The patient is not nervous/anxious.   All other systems reviewed and are negative.  The patient does not have symptoms concerning for COVID-19 infection (fever, chills, cough, or new shortness of breath).  The patient is practicing social distancing. Wearing a mask & avoiding crowds.   COVID-19 Education: The signs and symptoms of COVID-19 were discussed with the patient and how to seek care for testing (follow up with PCP or arrange E-visit).   The importance of social distancing was discussed today.  He is now HIGH RISK.    I have reviewed and (if needed) personally updated the patient's problem list, medications, allergies, past medical and surgical history, social and family history.   Past Medical History:  Diagnosis Date   Asthma    as child    Atrial fibrillation, rapid ([place]) 12/08/2017   Diagnosed during colonoscopy -presumably new onset   GERD (gastroesophageal reflux disease)    use meds prn   Glaucoma    Headache    remote h/o migraines, none in years   Hyperlipidemia    NICM (nonischemic cardiomyopathy) ([place]) 04/2019   Echo 05/2019 - EF reduced to <20%.  Has permanent Afib & LBBB (normal Coronaries on Cath).    TIA (transient ischemic attack) 01/13/2018   Ventral hernia     Past Surgical History:  Procedure Laterality Date   CARDIAC EVENT MONITOR  12/2016   showed persistent Afib -monitor return in early because of persistent A. fib.  Several episodes of severe RVR with rates in the 170s were noted.    CARDIOVERSION N/A 01/13/2018   Procedure: CARDIOVERSION;  Surgeon: Pixie Casino, MD;  Location: [facility] ENDOSCOPY;  Service: Cardiovascular;  Laterality: N/A; unsuccessful.-->  Post-cardioversion, the patient had left-sided facial numbness and weakness.  Ruled out for stroke.   INSERTION OF MESH N/A 07/22/2017   Procedure: INSERTION OF MESH;  Surgeon: [name], MD;  Location: [place];  Service: General;  Laterality: N/A;  BILATERAL TAP BLOCK   NM MYOVIEW LTD  12/2017   Shows reduced EF, but no evidence of ischemia or infarction.  EF 40 and 45%.  INTERMEDIATE RISK due to reduced function.  EF notably better on echo (50 and 55%)   RIGHT/LEFT HEART CATH AND CORONARY ANGIOGRAPHY N/A 07/14/2019   Procedure: RIGHT/LEFT HEART CATH AND CORONARY ANGIOGRAPHY;  Surgeon: [name], MD;  Location: [place] CV LAB;; Angiographically normal coronary arteries. RHC  - PCWP & LVEDP 17 mmHg.  CO-CI 4.92-2.34   TEE WITHOUT CARDIOVERSION N/A 01/13/2018   Procedure: TRANSESOPHAGEAL ECHOCARDIOGRAM (TEE) with cardioversion - ;  Surgeon: Pixie Casino, MD;  Location: [facility] ENDOSCOPY;  Service: Cardiovascular;  Laterality: N/A;  Mild concentric LVH.  EF 55 to 60%.  No R WMA.  Mild ascending aortic dilation of 4.2 cm.  Dilated left  atrium.  No thrombus.   TONSILLECTOMY     TRANSTHORACIC ECHOCARDIOGRAM  12/2017    EF 50 and 55%.  No regional wall motion normalities.   TRANSTHORACIC ECHOCARDIOGRAM  06/2019    Severely reduced function with diffuse hypokinesis and septal dyskinesis.  EF <20%.  Cannot exclude LV thrombus.  Reduced RV function with mildly elevated RV P--37 mmHg.  Moderate RA and mild LA dilation.  A centimeter dilation 42 mm   VENTRAL HERNIA REPAIR N/A 07/22/2017   Procedure: OPEN VENTRAL HERNIA REPAIR WITH MESH ERAS PATHWAY;  Surgeon: [name], MD;  Location: [place];  Service: General;  Laterality: N/A;  BILATERAL TAP BLOCK    Current Meds  Medication Sig   acetaminophen (TYLENOL) 325 MG tablet Take 650 mg by mouth every 6 (six) hours as needed.   albuterol (VENTOLIN HFA) 108 (90 Base) MCG/ACT inhaler Inhale 1-2 puffs into the lungs every 6 (six) hours as needed for wheezing or shortness of breath.   aspirin EC 81 MG tablet Take 81 mg by mouth daily.   carvedilol (COREG) 3.125 MG tablet Take 1 tablet (3.125 mg total) by mouth 2 (two) times daily.   Cholecalciferol (VITAMIN  D3) 5000 units TABS Take 5,000 Units by mouth daily.    digoxin (LANOXIN) 0.125 MG tablet Take 1 tablet (0.125 mg total) by mouth daily.   furosemide (LASIX) 40 MG tablet Take 1 tablet (40 mg total) by mouth 2 (two) times daily. MAY TAKE AN AN ADDITIONAL DOSE IF WEIGHT IS GREATER THAN 3 LBS DAILY.   hydrocortisone 2.5 % cream Apply 1 application topically 2 (two) times daily as needed (skin irritation/rash).    Omega-3 Fatty Acids (FISH OIL) 1200 MG CAPS Take 1,200 mg by mouth daily.   rivaroxaban (XARELTO) 20 MG TABS tablet TAKE 1 TABLET (20 MG TOTAL) BY MOUTH DAILY WITH SUPPER. (Patient taking differently: Take 20 mg by mouth daily with supper. )   Specialty Vitamins Products (ULTRA MAN PO) Take 1 tablet by mouth daily.    spironolactone (ALDACTONE) 25 MG tablet Take 1 tablet (25 mg total) by mouth daily.    [DISCONTINUED] furosemide (LASIX) 40 MG tablet Take 1 tablet (40 mg total) by mouth daily.   [DISCONTINUED] spironolactone (ALDACTONE) 25 MG tablet Take 0.5 tablets (12.5 mg total) by mouth daily.    Allergies  Allergen Reactions   Ezetimibe Other (See Comments)    Broke out in sores/ muscle pain   Omeprazole Nausea And Vomiting   Statins Other (See Comments)    Muscle pains, mouth sores    Social History   Tobacco Use   Smoking status: Never Smoker   Smokeless tobacco: Never Used  Substance Use Topics   Alcohol use: Yes    Comment: Rarely   Drug use: No   Social History   Social History Narrative   He is a married father of 40, grandfather of 1.  Lives with his wife.   He does exercise regularly most days of the week for about 30 minutes.  He walks about 1-3 miles a day.  He will occasionally also walk on the treadmill.  He notes his energy is better if he remains active.  --His wife is finishing up his treatment for breast cancer with chemotherapy radiation.  T.  family history includes CVA in his maternal grandmother; Diabetes in his mother; Heart failure in his father; Hypertension in his brother.  Wt Readings from Last 3 Encounters:  07/29/19 206 lb (93.4 kg)  07/14/19 209 lb (94.8 kg)  07/11/19 213 lb (96.6 kg)    PHYSICAL EXAM BP 103/76    Pulse 78    Temp 97.7 F (36.5 C)    Ht 5\' 9"  (1.753 m)    Wt 206 lb (93.4 kg)    SpO2 96%    BMI 30.42 kg/m  Physical Exam  Constitutional: He is oriented to person, place, and time. He appears well-developed and well-nourished. No distress.  Healthy-appearing.  Well-groomed.  HENT:  Head: Normocephalic and atraumatic.  Eyes: EOM are normal.  Neck: Normal range of motion. Neck supple. No hepatojugular reflux and no JVD present. Carotid bruit is not present.  Cardiovascular: Normal rate, normal heart sounds and intact distal pulses. An irregularly irregular rhythm present.  No extrasystoles are present. PMI is not  displaced. Exam reveals no gallop and no friction rub.  No murmur heard. Split S2  Pulmonary/Chest: Effort normal and breath sounds normal. No respiratory distress. He has no wheezes. He has no rales.  Abdominal: Soft. Bowel sounds are normal. He exhibits no distension. There is no abdominal tenderness. There is no rebound.  No HSM  Musculoskeletal: Normal range of motion.  General: Edema (Trivial BLE) present.     Comments: Significant left greater than right swollen dilated varicose veins. R radial & antecubital cath sites -no hematoma or ecchymosis.  Neurological: He is alert and oriented to person, place, and time.  Skin: Skin is warm and dry.  Mild bruising.  Spider veins on legs.  Psychiatric: His behavior is normal. Judgment and thought content normal.  Vitals reviewed.    Adult ECG Report  Rate: 83 ;  Rhythm: atrial fibrillation and LBBB (new).  PVCs/aberrantly conducted beats.;   Narrative Interpretation: Relatively stable   Other studies Reviewed: Additional studies/ records that were reviewed today include:  Recent Labs:   Lab Results  Component Value Date   CREATININE 0.96 01/06/2018   BUN 15 01/06/2018   NA 141 07/14/2019   K 3.9 07/14/2019   CL 103 01/06/2018   CO2 24 01/06/2018   Lab Results  Component Value Date   CHOL  213 (H)  07/06/2019   HDL  43  07/06/2019   LDLCALC 147 (H)  07/06/2019   TRIG 116  07/06/2019    ASSESSMENT / PLAN: Problem List Items Addressed This Visit    Persistent atrial fibrillation ([place]): CHA2DS2-VASc Score 2 - Primary (Chronic)    Essentially permanent A. fib now complicated by bundle branch block and eventual nonischemic cardiomyopathy.    Without any other obvious potential etiology for cardiomyopathy, we have to assume that A. fib is related, and the reduced EF may be made worse or caused by left bundle branch block. Nonischemic cardiac catheterization.  Plan:   We converted him from diltiazem to carvedilol and to  maintain adequate rate control with additional digoxin.  He is on Xarelto  I am going to refer him to Dr. Allegra Lai from electrophysiology to discuss the possibility of A. fib ablation with hopes that potentially this would improve his EF.  But would also begin discussion for possible BiV ICD for CRT-D if his EF does not improve by late November follow-up.      Relevant Medications   spironolactone (ALDACTONE) 25 MG tablet   furosemide (LASIX) 40 MG tablet   Other Relevant Orders   EKG 12-Lead   ECHOCARDIOGRAM COMPLETE   Nonischemic dilated cardiomyopathy (HCC) (Chronic)    Confirm now to be nonischemic by cardiac catheterization, one must assume that there is a combination of A. fib and now the development of left bundle branch block that is at least partially responsible for his cardiomyopathy.  No other obvious etiology.  Cardiac cath showed normal coronary arteries and relatively well-preserved right heart Pressures.  EDP was up a little bit so I bumped up his Lasix.  Unfortunately his blood pressure really will tolerate his being able do much more than low-dose carvedilol and spironolactone in addition to Lasix.  We have added digoxin, but I do not think his blood pressure will tolerate as adding an ARB, let alone Entresto.  For now, plan is to continue to gently titrate up medications and reassess EF by the end of the year.  I am also referring to EP to discuss possibility of A. fib ablation and potential BiBVICD (CRT-D) in the near future.      Relevant Medications   spironolactone (ALDACTONE) 25 MG tablet   furosemide (LASIX) 40 MG tablet   Other Relevant Orders   EKG 12-Lead   ECHOCARDIOGRAM COMPLETE   Chronic combined systolic and diastolic heart failure (HCC) (Chronic)    He really did not start having symptoms  until May-June of this year.  Now found to have nonischemic dilated cardiomyopathy.  Has pretty significant orthopnea and intermittent PND episodes of "smothering".   Edema seems to be pretty well controlled.  At this point we are limited by blood pressure.  He is on very low-dose carvedilol and spironolactone along with digoxin. --We will hope to eventually start an ARB or even potential Entresto, but not as long as his blood pressures are just over 100.  Plan:  We will now change his Lasix to 40 mg daily along with sliding scale as needed additional dosing. Refer to EP -and may consider consultation with the A. fib clinic Recheck echo late November, early December and then follow-up after.      Relevant Medications   spironolactone (ALDACTONE) 25 MG tablet   furosemide (LASIX) 40 MG tablet      I spent a total of 30 minutes with the patient and chart review. >  50% of the time was spent in direct patient consultation.  Detailed explanation of the echocardiogram results, changes in therapy as well as explanation of right and left heart catheterization and possible PCI.  The procedure with Risks/Benefits/Alternatives and Indications was reviewed with the patient.  All questions were answered.    Risks / Complications include, but not limited to: Death, MI, CVA/TIA, VF/VT (with defibrillation), Bradycardia (need for temporary pacer placement), contrast induced nephropathy, bleeding / bruising / hematoma / pseudoaneurysm, vascular or coronary injury (with possible emergent CT or Vascular Surgery), adverse medication reactions, infection.  Additional risks involving the use of radiation with the possibility of radiation burns and cancer were explained in detail.  The patient voices understanding and agree to proceed.     Current medicines are reviewed at length with the patient today.  (+/- concerns) none The following changes have been made:  See below  Patient Instructions  Medication Instructions:  - INCREASE SPIRONOLACTONE 25 MG DAILY - LASIX ( FUROSEMIDE ) 40 MG TWICE A DAY , MAY TAKE AN ADDITIONAL TABLET IF INCREASE WEIGHT BY 3 LBS . -  START  TAKING POTASSIUM 20 MEG  DAILY , IF YOU TAKE AN EXTRA FUROSEMIDE THEN  TAKE AN EXTRA POTASSIUM  If you need a refill on your cardiac medications before your next appointment, please call your pharmacy.   Lab work: NOT NEEDED   Testing/Procedures: Will be schedule in [facility] 2020 AT [place] [address] Your physician has requested that you have an echocardiogram. Echocardiography is a painless test that uses sound waves to create images of your heart. It provides your doctor with information about the size and shape of your heart and how well your hearts chambers and valves are working. This procedure takes approximately one hour. There are no restrictions for this procedure.    Follow-Up: At [facility], you and your health needs are our priority.  As part of our continuing mission to provide you with exceptional heart care, we have created designated Provider Care Teams.  These Care Teams include your primary Cardiologist (physician) and Advanced Practice Providers (APPs -  Physician Assistants and Nurse Practitioners) who all work together to provide you with the care you need, when you need it.  You will need a follow up appointment in [place] after echo.  Please call our office 2 months in advance to schedule this appointment.  You may see [name], MD or one of the following Advanced Practice Providers on your designated Care Team:    Suanne Marker  Barrett, PA-C  Jory Sims, DNP, ANP  Any Other Special Instructions Will Be Listed Below (If Applicable).    Studies Ordered:   Orders Placed This Encounter  Procedures   EKG 12-Lead   ECHOCARDIOGRAM COMPLETE      [name], M.D., M.S. Interventional Cardiologist   Pager # [phone] Phone # [phone] [address] [place], [place] [address]   Thank you for choosing Heartcare at [facility]!!

## 2019-07-31 ENCOUNTER — Encounter: Payer: Self-pay | Admitting: Cardiology

## 2019-07-31 DIAGNOSIS — I5042 Chronic combined systolic (congestive) and diastolic (congestive) heart failure: Secondary | ICD-10-CM | POA: Insufficient documentation

## 2019-07-31 NOTE — Assessment & Plan Note (Signed)
Essentially permanent A. fib now complicated by bundle branch block and eventual nonischemic cardiomyopathy.    Without any other obvious potential etiology for cardiomyopathy, we have to assume that A. fib is related, and the reduced EF may be made worse or caused by left bundle branch block. Nonischemic cardiac catheterization.  Plan:   We converted him from diltiazem to carvedilol and to maintain adequate rate control with additional digoxin.  He is on Xarelto  I am going to refer him to Dr. Allegra Lai from electrophysiology to discuss the possibility of A. fib ablation with hopes that potentially this would improve his EF.  But would also begin discussion for possible BiV ICD for CRT-D if his EF does not improve by late November follow-up.

## 2019-07-31 NOTE — Assessment & Plan Note (Addendum)
Confirm now to be nonischemic by cardiac catheterization, one must assume that there is a combination of A. fib and now the development of left bundle branch block that is at least partially responsible for his cardiomyopathy.  No other obvious etiology.  Cardiac cath showed normal coronary arteries and relatively well-preserved right heart Pressures.  EDP was up a little bit so I bumped up his Lasix.  Unfortunately his blood pressure really will tolerate his being able do much more than low-dose carvedilol and spironolactone in addition to Lasix.  We have added digoxin, but I do not think his blood pressure will tolerate as adding an ARB, let alone Entresto.  For now, plan is to continue to gently titrate up medications and reassess EF by the end of the year.  I am also referring to EP to discuss possibility of A. fib ablation and potential BiBVICD (CRT-D) in the near future.

## 2019-07-31 NOTE — Assessment & Plan Note (Signed)
He really did not start having symptoms until May-June of this year.  Now found to have nonischemic dilated cardiomyopathy.  Has pretty significant orthopnea and intermittent PND episodes of "smothering".  Edema seems to be pretty well controlled.  At this point we are limited by blood pressure.  He is on very low-dose carvedilol and spironolactone along with digoxin. --We will hope to eventually start an ARB or even potential Entresto, but not as long as his blood pressures are just over 100.  Plan:  We will now change his Lasix to 40 mg daily along with sliding scale as needed additional dosing. Refer to EP -and may consider consultation with the A. fib clinic Recheck echo late November, early December and then follow-up after.

## 2019-08-02 ENCOUNTER — Encounter: Payer: Self-pay | Admitting: Cardiology

## 2019-08-02 ENCOUNTER — Ambulatory Visit (INDEPENDENT_AMBULATORY_CARE_PROVIDER_SITE_OTHER): Payer: BC Managed Care – PPO | Admitting: Cardiology

## 2019-08-02 ENCOUNTER — Other Ambulatory Visit: Payer: Self-pay

## 2019-08-02 ENCOUNTER — Telehealth (HOSPITAL_COMMUNITY): Payer: Self-pay | Admitting: Vascular Surgery

## 2019-08-02 VITALS — BP 116/68 | HR 66 | Ht 69.0 in | Wt 203.8 lb

## 2019-08-02 DIAGNOSIS — I4891 Unspecified atrial fibrillation: Secondary | ICD-10-CM | POA: Diagnosis not present

## 2019-08-02 DIAGNOSIS — I4819 Other persistent atrial fibrillation: Secondary | ICD-10-CM

## 2019-08-02 DIAGNOSIS — Z79899 Other long term (current) drug therapy: Secondary | ICD-10-CM

## 2019-08-02 LAB — COMPREHENSIVE METABOLIC PANEL
ALT: 33 IU/L (ref 0–44)
AST: 28 IU/L (ref 0–40)
Albumin/Globulin Ratio: 1.8 (ref 1.2–2.2)
Albumin: 4.8 g/dL (ref 3.8–4.8)
Alkaline Phosphatase: 66 IU/L (ref 39–117)
BUN/Creatinine Ratio: 17 (ref 10–24)
BUN: 22 mg/dL (ref 8–27)
Bilirubin Total: 0.6 mg/dL (ref 0.0–1.2)
CO2: 22 mmol/L (ref 20–29)
Calcium: 10.3 mg/dL — ABNORMAL HIGH (ref 8.6–10.2)
Chloride: 98 mmol/L (ref 96–106)
Creatinine, Ser: 1.33 mg/dL — ABNORMAL HIGH (ref 0.76–1.27)
GFR calc Af Amer: 63 mL/min/{1.73_m2} (ref >59)
GFR calc non Af Amer: 55 mL/min/{1.73_m2} — ABNORMAL LOW (ref >59)
Globulin, Total: 2.7 g/dL (ref 1.5–4.5)
Glucose: 101 mg/dL — ABNORMAL HIGH (ref 65–99)
Potassium: 4.9 mmol/L (ref 3.5–5.2)
Sodium: 137 mmol/L (ref 134–144)
Total Protein: 7.5 g/dL (ref 6.0–8.5)

## 2019-08-02 LAB — TSH: TSH: 1.53 u[IU]/mL (ref 0.450–4.500)

## 2019-08-02 MED ORDER — AMIODARONE HCL 200 MG PO TABS: 200.0000 mg | ORAL_TABLET | Freq: Every day | ORAL | 3 refills | Status: DC

## 2019-08-02 MED ORDER — AMIODARONE HCL 400 MG PO TABS: 400.0000 mg | ORAL_TABLET | Freq: Two times a day (BID) | ORAL | 0 refills | Status: DC

## 2019-08-02 NOTE — Telephone Encounter (Signed)
lvm on home /and cell phone to give pt ASAP appt w/ [facility] 9/24 @ 2pm. Asked pt to call office to confirm appt

## 2019-08-02 NOTE — Patient Instructions (Addendum)
Medication Instructions:  Please start Amiodarone 400 mg twice a day for 2 weeks then decrease to 200 mg daily. Continue all other medications as listed.  If you need a refill on your cardiac medications before your next appointment, please call your pharmacy.   Lab work: Please have blood work today (CMP, TSH) If you have labs (blood work) drawn today and your tests are completely normal, you will receive your results only by: [name] MyChart Message (if you have MyChart) OR . A paper copy in the mail If you have any lab test that is abnormal or we need to change your treatment, we will call you to review the results.  Testing/Procedures: Your physician has requested that you have a Cardioversion after your have been taking Amiodarone 400 mg twice a day for 2 weeks.  Electrical Cardioversion uses a jolt of electricity to your heart either through paddles or wired patches attached to your chest. This is a controlled, usually prescheduled, procedure. This procedure is done at the hospital and you are not awake during the procedure. You usually go home the day of the procedure. Please see the instruction sheet given to you today for more information.  You have been referred to the [facility] for further evaluation and treatment.  They will call you to schedule this appointment.  Follow-Up: At [facility], you and your health needs are our priority.  As part of our continuing mission to provide you with exceptional heart care, we have created designated Provider Care Teams.  These Care Teams include your primary Cardiologist (physician) and Advanced Practice Providers (APPs -  Physician Assistants and Nurse Practitioners) who all work together to provide you with the care you need, when you need it. You will need a follow up appointment in 3 months.  Please call our office 2 months in advance to schedule this appointment.  You may see Dr Curt Bears or one of the following Advanced  Practice Providers on your designated Care Team:   Chanetta Marshall, NP . Tommye Standard, PA-C  Thank you for choosing [facility]!!      Electrical Cardioversion  Electrical cardioversion is the delivery of a jolt of electricity to restore a normal rhythm to the heart. A rhythm that is too fast or is not regular keeps the heart from pumping well. In this procedure, sticky patches or metal paddles are placed on the chest to deliver electricity to the heart from a device. This procedure may be done in an emergency if:  There is low or no blood pressure as a result of the heart rhythm.  Normal rhythm must be restored as fast as possible to protect the brain and heart from further damage.  It may save a life. This procedure may also be done for irregular or fast heart rhythms that are not immediately life-threatening. Tell a health care provider about:  Any allergies you have.  All medicines you are taking, including vitamins, herbs, eye drops, creams, and over-the-counter medicines.  Any problems you or family members have had with anesthetic medicines.  Any blood disorders you have.  Any surgeries you have had.  Any medical conditions you have.  Whether you are pregnant or may be pregnant. What are the risks? Generally, this is a safe procedure. However, problems may occur, including:  Allergic reactions to medicines.  A blood clot that breaks free and travels to other parts of your body.  The possible return of an abnormal heart rhythm within  hours or days after the procedure.  Your heart stopping (cardiac arrest). This is rare. What happens before the procedure? Medicines  Your health care provider may have you start taking: ? Blood-thinning medicines (anticoagulants) so your blood does not clot as easily. ? Medicines may be given to help stabilize your heart rate and rhythm.  Ask your health care provider about changing or stopping your regular medicines.  This is especially important if you are taking diabetes medicines or blood thinners. General instructions  Plan to have someone take you home from the hospital or clinic.  If you will be going home right after the procedure, plan to have someone with you for 24 hours.  Follow instructions from your health care provider about eating or drinking restrictions. What happens during the procedure?  To lower your risk of infection: ? Your health care team will wash or sanitize their hands. ? Your skin will be washed with soap.  An IV tube will be inserted into one of your veins.  You will be given a medicine to help you relax (sedative).  Sticky patches (electrodes) or metal paddles may be placed on your chest.  An electrical shock will be delivered. The procedure may vary among health care providers and hospitals. What happens after the procedure?   Your blood pressure, heart rate, breathing rate, and blood oxygen level will be monitored until the medicines you were given have worn off.  Do not drive for 24 hours if you were given a sedative.  Your heart rhythm will be watched to make sure it does not change. This information is not intended to replace advice given to you by your health care provider. Make sure you discuss any questions you have with your health care provider. Document Released: 10/17/2002 Document Revised: 10/09/2017 Document Reviewed: 05/02/2016 Elsevier Patient Education  2020 [employer].

## 2019-08-02 NOTE — Progress Notes (Signed)
Electrophysiology Office Note   Date:  08/02/2019   ID:  Randall Best, DOB 1951-07-16, MRN [id number]  PCP:  Camille Bal, PA-C  Cardiologist:  Ellyn Hack Primary Electrophysiologist:  Will Meredith Leeds, MD    Chief Complaint:    History of Present Illness: Randall Best is a 68 y.o. male who is being seen today for the evaluation of AF, CHF at the request of Randall Best. Presenting today for electrophysiology evaluation.  He has a history of nonischemic cardiomyopathy, persistent atrial fibrillation, and a newly documented left bundle branch block.  He had a recent left heart catheterization that showed a well compensated cardiomyopathy with no evidence of coronary artery disease.  He was previously quite short of breath, but his Lasix and digoxin were increased which improved his symptoms.  He does not feel that he has been in atrial fibrillation, though he is persistent.  Today, he denies symptoms of palpitations, chest pain, lower extremity edema, claudication, dizziness, presyncope, syncope, bleeding, or neurologic sequela. The patient is tolerating medications without difficulties.  Since his increased dose of Lasix, he has done better with less lower extremity edema.  He does continue to feel weak, fatigued, and has dyspnea on exertion.  He also sleeps upright in a chair as he gets short of breath when he lays flat.   Past Medical History:  Diagnosis Date  . Asthma    as child  . Atrial fibrillation, rapid ([place]) 12/08/2017   Diagnosed during colonoscopy -presumably new onset  . GERD (gastroesophageal reflux disease)    use meds prn  . Glaucoma   . Headache    remote h/o migraines, none in years  . Hyperlipidemia   . NICM (nonischemic cardiomyopathy) ([place]) 04/2019   Echo 05/2019 - EF reduced to <20%.  Has permanent Afib & LBBB (normal Coronaries on Cath).   [name] TIA (transient ischemic attack) 01/13/2018  . Ventral hernia    Past Surgical History:  Procedure  Laterality Date  . CARDIAC EVENT MONITOR  12/2016   showed persistent Afib -monitor return in early because of persistent A. fib.  Several episodes of severe RVR with rates in the 170s were noted.   [name] CARDIOVERSION N/A 01/13/2018   Procedure: CARDIOVERSION;  Surgeon: Pixie Casino, MD;  Location: [facility] ENDOSCOPY;  Service: Cardiovascular;  Laterality: N/A; unsuccessful.-->  Post-cardioversion, the patient had left-sided facial numbness and weakness.  Ruled out for stroke.  . INSERTION OF MESH N/A 07/22/2017   Procedure: INSERTION OF MESH;  Surgeon: [name], MD;  Location: [facility];  Service: General;  Laterality: N/A;  BILATERAL TAP BLOCK  . NM MYOVIEW LTD  12/2017   Shows reduced EF, but no evidence of ischemia or infarction.  EF 40 and 45%.  INTERMEDIATE RISK due to reduced function.  EF notably better on echo (50 and 55%)  . RIGHT/LEFT HEART CATH AND CORONARY ANGIOGRAPHY N/A 07/14/2019   Procedure: RIGHT/LEFT HEART CATH AND CORONARY ANGIOGRAPHY;  Surgeon: [name], MD;  Location: [facility] CV LAB;; Angiographically normal coronary arteries. RHC  - PCWP & LVEDP 17 mmHg.  CO-CI 4.92-2.34  . TEE WITHOUT CARDIOVERSION N/A 01/13/2018   Procedure: TRANSESOPHAGEAL ECHOCARDIOGRAM (TEE) with cardioversion - ;  Surgeon: Pixie Casino, MD;  [facility] ENDOSCOPY;  Service: Cardiovascular;  Laterality: N/A;  Mild concentric LVH.  EF 55 to 60%.  No R WMA.  Mild ascending aortic dilation of 4.2 cm.  Dilated left atrium.  No thrombus.  . TONSILLECTOMY    .  TRANSTHORACIC ECHOCARDIOGRAM  12/2017    EF 50 and 55%.  No regional wall motion normalities.  . TRANSTHORACIC ECHOCARDIOGRAM  06/2019    Severely reduced function with diffuse hypokinesis and septal dyskinesis.  EF <20%.  Cannot exclude LV thrombus.  Reduced RV function with mildly elevated RV P--37 mmHg.  Moderate RA and mild LA dilation.  A centimeter dilation 42 mm  . VENTRAL HERNIA REPAIR N/A 07/22/2017   Procedure: OPEN VENTRAL HERNIA  REPAIR WITH MESH ERAS PATHWAY;  Surgeon: [name], MD;  Location: [facility];  Service: General;  Laterality: N/A;  BILATERAL TAP BLOCK     Current Outpatient Medications  Medication Sig Dispense Refill  . acetaminophen (TYLENOL) 325 MG tablet Take 650 mg by mouth every 6 (six) hours as needed.    [name] albuterol (VENTOLIN HFA) 108 (90 Base) MCG/ACT inhaler Inhale 1-2 puffs into the lungs every 6 (six) hours as needed for wheezing or shortness of breath. 1 Inhaler 6  . carvedilol (COREG) 3.125 MG tablet Take 1 tablet (3.125 mg total) by mouth 2 (two) times daily. 180 tablet 3  . Cholecalciferol (VITAMIN D3) 5000 units TABS Take 5,000 Units by mouth daily.     . digoxin (LANOXIN) 0.125 MG tablet Take 1 tablet (0.125 mg total) by mouth daily. 90 tablet 3  . furosemide (LASIX) 40 MG tablet Take 1 tablet (40 mg total) by mouth 2 (two) times daily. MAY TAKE AN AN ADDITIONAL DOSE IF WEIGHT IS GREATER THAN 3 LBS DAILY. 110 tablet 3  . hydrocortisone 2.5 % cream Apply 1 application topically 2 (two) times daily as needed (skin irritation/rash).     . Omega-3 Fatty Acids (FISH OIL) 1200 MG CAPS Take 1,200 mg by mouth daily.    . potassium chloride SA (K-DUR) 20 MEQ tablet Take 1 tablet (20 mEq total) by mouth daily. MAY TAKE AN ADDITIONAL TABLET IF EXTRA LASIX IS TAKEN AS NEEDED 110 tablet 3  . rivaroxaban (XARELTO) 20 MG TABS tablet TAKE 1 TABLET (20 MG TOTAL) BY MOUTH DAILY WITH SUPPER. 90 tablet 2  . Specialty Vitamins Products (ULTRA MAN PO) Take 1 tablet by mouth daily.     [name] spironolactone (ALDACTONE) 25 MG tablet Take 1 tablet (25 mg total) by mouth daily. 90 tablet 3  . amiodarone (PACERONE) 200 MG tablet Take 1 tablet (200 mg total) by mouth daily. 90 tablet 3  . amiodarone (PACERONE) 400 MG tablet Take 1 tablet (400 mg total) by mouth 2 (two) times daily. Take 400 mg twice a day for 2 weeks. 28 tablet 0   No current facility-administered medications for this visit.     Allergies:    Ezetimibe, Omeprazole, and Statins   Social History:  The patient  reports that he has never smoked. He has never used smokeless tobacco. He reports current alcohol use. He reports that he does not use drugs.   Family History:  The patient's family history includes CVA in his maternal grandmother; Diabetes in his mother; Heart failure in his father; Hypertension in his brother.    ROS:  Please see the history of present illness.   Otherwise, review of systems is positive for none.   All other systems are reviewed and negative.    PHYSICAL EXAM: VS:  BP 116/68   Pulse 66   Ht 5\' 9"  (1.753 m)   Wt 203 lb 12.8 oz (92.4 kg)   SpO2 97%   BMI 30.10 kg/m  , BMI Body mass index is 30.1  kg/m. GEN: Well nourished, well developed, in no acute distress  HEENT: normal  Neck: no JVD, carotid bruits, or masses Cardiac: iRRR; no murmurs, rubs, or gallops,no edema  Respiratory:  clear to auscultation bilaterally, normal work of breathing GI: soft, nontender, nondistended, + BS MS: no deformity or atrophy  Skin: warm and dry Neuro:  Strength and sensation are intact Psych: euthymic mood, full affect  EKG:  EKG is ordered today. Personal review of the ekg ordered 07/29/19 shows atrial fibrillation, left bundle branch block, rate 88   Recent Labs: 07/14/2019: Hemoglobin 15.6; Platelets 239; Potassium 3.9; Sodium 141    Lipid Panel     Component Value Date/Time   CHOL 153 01/14/2018 0631   TRIG 97 01/14/2018 0631   HDL 32 (L) 01/14/2018 0631   CHOLHDL 4.8 01/14/2018 0631   VLDL 19 01/14/2018 0631   LDLCALC 102 (H) 01/14/2018 0631     Wt Readings from Last 3 Encounters:  08/02/19 203 lb 12.8 oz (92.4 kg)  07/29/19 206 lb (93.4 kg)  07/14/19 209 lb (94.8 kg)      Other studies Reviewed: Additional studies/ records that were reviewed today include: TTE 07/05/19  Review of the above records today demonstrates:   1. The left ventricle has a visually estimated ejection fraction of <20%.  Diffuse hypokinesis with septal dyskinesis. The cavity size was normal. There is mild concentric left ventricular hypertrophy. Diastolic function is indeterminate due to atrial  fibrillation.  2. No clear apical thrombus. Consider limited echo with contrast to rule out LV apical thrombus  3. The right ventricle has mildly reduced systolic function. The cavity was mildly enlarged. There is no increase in right ventricular wall thickness. Right ventricular systolic pressure is mildly elevated with an estimated pressure of 37.0 mmHg.  4. Left atrial size was mildly dilated.  5. Right atrial size was moderately dilated.  6. There is dilatation of the ascending aorta measuring 42 mm.  RHC/LHC 07/14/19  Angiographically normal coronary arteries  ---------------------  There is severe left ventricular systolic dysfunction. The left ventricular ejection fraction is less than 25% by visual estimate by visual estimation  Relatively compensated right heart cath pressures with PCWP and LVEDP of 17 mmHg.  Cardiac Output-Index ([place]ick) 4.92-2.34  ASSESSMENT AND PLAN:  1.  Persistent atrial fibrillation: Currently on Xarelto and carvedilol.  Unfortunately has been in atrial fibrillation since 2018.  He has NYHA class III heart failure and thus I do not think ablation at this point would be his best option.  I do think that getting him into sinus rhythm may be possible.  I quoted him a 50% success rate.  He would like to try amiodarone.  We will start that today and plan for cardioversion in 2 weeks after he has had an oral load.  I have discussed this with his primary cardiologist.  This patients CHA2DS2-VASc Score and unadjusted Ischemic Stroke Rate (% per year) is equal to 4.8 % stroke rate/year from a score of 4  Above score calculated as 1 point each if present [CHF, HTN, DM, Vascular=MI/PAD/Aortic Plaque, Age if 65-74, or Male] Above score calculated as 2 points each if present [Age > 75, or  Stroke/TIA/TE]  2.  Nonischemic cardiomyopathy: Left heart catheterization without any evidence of coronary artery disease.  Currently on carvedilol, Aldactone, digoxin.  Blood pressure has not tolerated increasing doses of ACE inhibitor/ARB.  Repeat echocardiogram in a few months now that he is on a beta-blocker.  He would be  a candidate for CRT-D implant.  Have spoken to the heart failure physicians who will work to get him in hopefully later this week.  Current medicines are reviewed at length with the patient today.   The patient does not have concerns regarding his medicines.  The following changes were made today: Start amiodarone  Labs/ tests ordered today include:  Orders Placed This Encounter  Procedures  . Comprehensive metabolic panel  . TSH     Disposition:   FU with Will Camnitz 3 months  Signed, Will Meredith Leeds, MD  08/02/2019 11:44 AM     [facility] [address] [place] [address] [phone] (office) [phone] (fax)

## 2019-08-04 ENCOUNTER — Telehealth: Payer: Self-pay | Admitting: *Deleted

## 2019-08-04 ENCOUNTER — Encounter (HOSPITAL_COMMUNITY): Payer: Self-pay | Admitting: Cardiology

## 2019-08-04 ENCOUNTER — Other Ambulatory Visit: Payer: Self-pay

## 2019-08-04 ENCOUNTER — Ambulatory Visit (HOSPITAL_COMMUNITY)
Admission: RE | Admit: 2019-08-04 | Discharge: 2019-08-04 | Disposition: A | Discharge status: Home / Self Care | Payer: BC Managed Care – PPO | Source: Ambulatory Visit | Attending: Cardiology | Admitting: Cardiology

## 2019-08-04 VITALS — BP 128/72 | HR 76 | Wt 204.5 lb

## 2019-08-04 DIAGNOSIS — I5022 Chronic systolic (congestive) heart failure: Secondary | ICD-10-CM | POA: Insufficient documentation

## 2019-08-04 DIAGNOSIS — I428 Other cardiomyopathies: Secondary | ICD-10-CM | POA: Insufficient documentation

## 2019-08-04 DIAGNOSIS — I5042 Chronic combined systolic (congestive) and diastolic (congestive) heart failure: Secondary | ICD-10-CM | POA: Diagnosis not present

## 2019-08-04 DIAGNOSIS — Z823 Family history of stroke: Secondary | ICD-10-CM | POA: Insufficient documentation

## 2019-08-04 DIAGNOSIS — E785 Hyperlipidemia, unspecified: Secondary | ICD-10-CM | POA: Insufficient documentation

## 2019-08-04 DIAGNOSIS — Z833 Family history of diabetes mellitus: Secondary | ICD-10-CM | POA: Diagnosis not present

## 2019-08-04 DIAGNOSIS — Z7901 Long term (current) use of anticoagulants: Secondary | ICD-10-CM | POA: Diagnosis not present

## 2019-08-04 DIAGNOSIS — Z8249 Family history of ischemic heart disease and other diseases of the circulatory system: Secondary | ICD-10-CM | POA: Insufficient documentation

## 2019-08-04 DIAGNOSIS — Z8673 Personal history of transient ischemic attack (TIA), and cerebral infarction without residual deficits: Secondary | ICD-10-CM | POA: Diagnosis not present

## 2019-08-04 DIAGNOSIS — Z79899 Other long term (current) drug therapy: Secondary | ICD-10-CM | POA: Insufficient documentation

## 2019-08-04 DIAGNOSIS — I447 Left bundle-branch block, unspecified: Secondary | ICD-10-CM | POA: Insufficient documentation

## 2019-08-04 DIAGNOSIS — I482 Chronic atrial fibrillation, unspecified: Secondary | ICD-10-CM | POA: Diagnosis not present

## 2019-08-04 MED ORDER — ENTRESTO 24-26 MG PO TABS: 1.0000 | ORAL_TABLET | Freq: Two times a day (BID) | ORAL | 3 refills | Status: DC

## 2019-08-04 MED ORDER — FUROSEMIDE 40 MG PO TABS: 80.0000 mg | ORAL_TABLET | Freq: Every day | ORAL | 3 refills | Status: DC

## 2019-08-04 NOTE — Patient Instructions (Addendum)
STOP Potassium.  Take Lasix 80mg  daily.   START Entresto 24/26 mg twice daily.  Labs in 10 days at [facility] office

## 2019-08-04 NOTE — H&P (View-Only) (Signed)
PCP: Camille Bal, PA-C Cardiology: Dr. Ellyn Hack EP: Dr. Curt Bears HF Cardiology: Dr. Aundra Dubin  68 y.o. with history of chronic atrial fibrillation and nonischemic cardiomyopathy was referred by Dr. Curt Bears for evaluation of CHF.  Patient has had atrial fibrillation since at least 2018.  DCCV was attempted in 3/19 but failed.  He apparently had a TIA that was peri-DCCV. He has been in atrial fibrillation since that time. TEE in 3/19 showed EF 55-60%.  Echo was done in 8/20, showing EF now < 20% with septal-lateral dyssynchrony. He had RHC/LHC showing preserved cardiac output and no significant coronary disease.  He was referred to Dr. Curt Bears for management options for atrial fibrillation.  He was started on amiodarone and set up for DCCV in a couple weeks.   Patient has had symptoms of volume overload for the last 4-6 months.  He has a dry hacky cough especially when he lies down. He has orthopnea and PND. He is short of breath walking up stairs or doing other forms of moderate exertion.  No lightheadedness.  No chest pain.  +Fatigue and poor energy.  He drinks a lot of fluid (7-10 16 oz bottles of water daily).   ECG (9/20, personally reviewed): atrial fibrillation, LBBB 152 msec  REDS clip 41%  Labs (9/20): K 4.9, creatinine 1.33, TSH normal, LFTs normal  PMH: 1. Atrial fibrillation: Chronic, diagnosed in 2018.  Failed DCCV in 3/19.  2. TIA: Sounds like this occurred after the cardioversion (facial numbness).  3. Chronic LBBB 4. Sleep study in past showed no OSA.  5. Chronic systolic CHF: Nonischemic cardiomyopathy.  Possibly tachy-mediated versus LBBB-mediated - TEE (3/19): EF 55-60% - LHC/RHC (9/20) showed no significant coronary disease; mean RA 9, PA 34/8, mean PCWP 17, CI 2.34 - Echo (8/20): EF < 20%, septal-lateral dyssynchrony, mild LVH, mildly decreased RV systolic function.  6. Hyperlipidemia.  7. GERD  Social History   Socioeconomic History  . Marital status: Married   Spouse name: Not on file  . Number of children: Not on file  . Years of education: 52  . Highest education level: High school graduate  Occupational History  . Occupation: Retired  Scientific laboratory technician  . Financial resource strain: Not on file  . Food insecurity    Worry: Not on file    Inability: Not on file  . Transportation needs    Medical: Not on file    Non-medical: Not on file  Tobacco Use  . Smoking status: Never Smoker  . Smokeless tobacco: Never Used  Substance and Sexual Activity  . Alcohol use: Yes    Comment: Rarely  . Drug use: No  . Sexual activity: Not on file  Lifestyle  . Physical activity    Days per week: Not on file    Minutes per session: Not on file  . Stress: Not on file  Relationships  . Social Herbalist on phone: Not on file    Gets together: Not on file    Attends religious service: Not on file    Active member of club or organization: Not on file    Attends meetings of clubs or organizations: Not on file    Relationship status: Not on file  . Intimate partner violence    Fear of current or ex partner: Not on file    Emotionally abused: Not on file    Physically abused: Not on file    Forced sexual activity: Not on file  Other Topics  Concern  . Not on file  Social History Narrative   He is a married father of 41, grandfather of 1.  Lives with his wife.   He does exercise regularly most days of the week for about 30 minutes.  He walks about 1-3 miles a day.  He will occasionally also walk on the treadmill.  He notes his energy is better if he remains active.   Family History  Problem Relation Age of Onset  . Diabetes Mother        Died at 58  . Heart failure Father        05/02/2023 have had A. fib, died at 50  . Hypertension Brother        He is in his 28s  . CVA Maternal Grandmother        65s  . Colon cancer Neg Hx   . Esophageal cancer Neg Hx   . Rectal cancer Neg Hx   . Stomach cancer Neg Hx    ROS: All systems reviewed and  negative except as per HPI.   Current Outpatient Medications  Medication Sig Dispense Refill  . acetaminophen (TYLENOL) 325 MG tablet Take 650 mg by mouth every 6 (six) hours as needed.    [name] albuterol (VENTOLIN HFA) 108 (90 Base) MCG/ACT inhaler Inhale 1-2 puffs into the lungs every 6 (six) hours as needed for wheezing or shortness of breath. 1 Inhaler 6  . amiodarone (PACERONE) 200 MG tablet Take 1 tablet (200 mg total) by mouth daily. 90 tablet 3  . amiodarone (PACERONE) 400 MG tablet Take 1 tablet (400 mg total) by mouth 2 (two) times daily. Take 400 mg twice a day for 2 weeks. 28 tablet 0  . carvedilol (COREG) 3.125 MG tablet Take 1 tablet (3.125 mg total) by mouth 2 (two) times daily. 180 tablet 3  . Cholecalciferol (VITAMIN D3) 5000 units TABS Take 5,000 Units by mouth daily.     . digoxin (LANOXIN) 0.125 MG tablet Take 1 tablet (0.125 mg total) by mouth daily. 90 tablet 3  . furosemide (LASIX) 40 MG tablet Take 2 tablets (80 mg total) by mouth daily. 110 tablet 3  . hydrocortisone 2.5 % cream Apply 1 application topically 2 (two) times daily as needed (skin irritation/rash).     . Omega-3 Fatty Acids (FISH OIL) 1200 MG CAPS Take 1,200 mg by mouth daily.    . rivaroxaban (XARELTO) 20 MG TABS tablet TAKE 1 TABLET (20 MG TOTAL) BY MOUTH DAILY WITH SUPPER. 90 tablet 2  . Specialty Vitamins Products (ULTRA MAN PO) Take 1 tablet by mouth daily.     [name] spironolactone (ALDACTONE) 25 MG tablet Take 1 tablet (25 mg total) by mouth daily. 90 tablet 3  . sacubitril-valsartan (ENTRESTO) 24-26 MG Take 1 tablet by mouth 2 (two) times daily. 60 tablet 3   No current facility-administered medications for this encounter.    BP 128/72   Pulse 76   Wt 92.8 kg (204 lb 8 oz)   SpO2 99%   BMI 30.20 kg/m  General: NAD Neck: JVP 8-9 cm, no thyromegaly or thyroid nodule.  Lungs: Clear to auscultation bilaterally with normal respiratory effort. CV: Nondisplaced PMI.  Heart regular S1/S2, no S3/S4, no  murmur.  No peripheral edema.  No carotid bruit.  Normal pedal pulses.  Abdomen: Soft, nontender, no hepatosplenomegaly, no distention.  Skin: Intact without lesions or rashes.  Neurologic: Alert and oriented x 3.  Psych: Normal affect. Extremities: No clubbing or cyanosis.  HEENT: Normal.   Assessment/Plan: 1. Atrial fibrillation: Chronic, rate control is not ideal (HR actually around 100 bpm today).  I suspect atrial fibrillation contributes to his cardiomyopathy (atrial fibrillation-related CMP or tachy-mediated CMP).  Ideally, we would get him out of atrial fibrillation, but he has been in it since 2018 so may be difficult.  He has been started on amiodarone with plan for eventual DCCV.  - Continue amiodarone taper.  We will need to follow LFTs, TSH.  He will need a regular eye exam.  - DCCV planned for 08/19/19, he knows not to miss any Xarelto.  - If he goes back into NSR with DCCV, would given him a couple of months of NSR to see if he gets any atrial remodeling, then would consider atrial fibrillation ablation ([facility]-HF data).  - If he does not go back into NSR with DCCV, he will need CRT-D placement, possibly with AV nodal ablation.  - He had a prior sleep study that was negative for OSA.  2. Chronic systolic CHF: Nonischemic cardiomyopathy.  Echo in 8/20 with EF < 20%, paradoxical septal motion.  He has a LBBB, 156 msec on last ECG.  Cath in 9/20 with preserved cardiac output, no significant CAD. Cardiomyopathy may be due to atrial fibrillation itself versus tachy-mediated CMP, LBBB can also contribute to cardiomyopathy. NYHA class II-III with prominent orthopnea/PND.  He is mildly volume overloaded by exam, but REDS clip suggests more significant volume overload (41%).   - We need to get him back in NSR (see discussion above).  - Continue Lasix 80 qam/20 qpm (his home regimen) but will add Entresto 24/26 (This should help diuresis).  BMET in 10 days.  - Can stop KCl supplement.  -  Continue low dose Coreg, would not increase with volume overload.  - Continue digoxin, check level in 10 days with BMET.  - Continue spironolactone 25 mg daily.  - If he converts to NSR on amiodarone, will need repeat echo in 3 months with CRT-D if EF remains low.  If he does not convert to NSR, would proceed with CRT-D implantation.  Given baseline tachycardia, he may need AV nodal ablation in that case to properly BiV pace.  3. Hyperlipidemia: LDL quite high, but he has not tolerated statins or Zetia.   I will see him at [facility] on 10/9 and will likely adjust his meds then and reassess volume status.  He is distressed by the facilities fee that our clinic charges.  Even though insurance likely covers, he does not want to have to pay this "on principle."  Unless he changes his mind, he will have to followup with Dr. Ellyn Hack.   Loralie Champagne 08/04/2019

## 2019-08-04 NOTE — Telephone Encounter (Signed)
lmtcb to arrange DCCV

## 2019-08-04 NOTE — Telephone Encounter (Signed)
DCCV scheduled for 10/9, instructions reviewed. COVID screening scheduled for 10/6. Recall placed for 3 mo post procedure follow up. Patient verbalized understanding and agreeable to plan.

## 2019-08-04 NOTE — Progress Notes (Signed)
PCP: Camille Bal, PA-C Cardiology: Dr. Ellyn Hack EP: Dr. Curt Bears HF Cardiology: Dr. Aundra Dubin  68 y.o. with history of chronic atrial fibrillation and nonischemic cardiomyopathy was referred by Dr. Curt Bears for evaluation of CHF.  Patient has had atrial fibrillation since at least 2018.  DCCV was attempted in 3/19 but failed.  He apparently had a TIA that was peri-DCCV. He has been in atrial fibrillation since that time. TEE in 3/19 showed EF 55-60%.  Echo was done in 8/20, showing EF now < 20% with septal-lateral dyssynchrony. He had RHC/LHC showing preserved cardiac output and no significant coronary disease.  He was referred to Dr. Curt Bears for management options for atrial fibrillation.  He was started on amiodarone and set up for DCCV in a couple weeks.   Patient has had symptoms of volume overload for the last 4-6 months.  He has a dry hacky cough especially when he lies down. He has orthopnea and PND. He is short of breath walking up stairs or doing other forms of moderate exertion.  No lightheadedness.  No chest pain.  +Fatigue and poor energy.  He drinks a lot of fluid (7-10 16 oz bottles of water daily).   ECG (9/20, personally reviewed): atrial fibrillation, LBBB 152 msec  REDS clip 41%  Labs (9/20): K 4.9, creatinine 1.33, TSH normal, LFTs normal  PMH: 1. Atrial fibrillation: Chronic, diagnosed in 2018.  Failed DCCV in 3/19.  2. TIA: Sounds like this occurred after the cardioversion (facial numbness).  3. Chronic LBBB 4. Sleep study in past showed no OSA.  5. Chronic systolic CHF: Nonischemic cardiomyopathy.  Possibly tachy-mediated versus LBBB-mediated - TEE (3/19): EF 55-60% - LHC/RHC (9/20) showed no significant coronary disease; mean RA 9, PA 34/8, mean PCWP 17, CI 2.34 - Echo (8/20): EF < 20%, septal-lateral dyssynchrony, mild LVH, mildly decreased RV systolic function.  6. Hyperlipidemia.  7. GERD  Social History   Socioeconomic History  . Marital status: Married   Spouse name: Not on file  . Number of children: Not on file  . Years of education: 2  . Highest education level: High school graduate  Occupational History  . Occupation: Retired  Scientific laboratory technician  . Financial resource strain: Not on file  . Food insecurity    Worry: Not on file    Inability: Not on file  . Transportation needs    Medical: Not on file    Non-medical: Not on file  Tobacco Use  . Smoking status: Never Smoker  . Smokeless tobacco: Never Used  Substance and Sexual Activity  . Alcohol use: Yes    Comment: Rarely  . Drug use: No  . Sexual activity: Not on file  Lifestyle  . Physical activity    Days per week: Not on file    Minutes per session: Not on file  . Stress: Not on file  Relationships  . Social Herbalist on phone: Not on file    Gets together: Not on file    Attends religious service: Not on file    Active member of club or organization: Not on file    Attends meetings of clubs or organizations: Not on file    Relationship status: Not on file  . Intimate partner violence    Fear of current or ex partner: Not on file    Emotionally abused: Not on file    Physically abused: Not on file    Forced sexual activity: Not on file  Other Topics  Concern  . Not on file  Social History Narrative   He is a married father of 77, grandfather of 1.  Lives with his wife.   He does exercise regularly most days of the week for about 30 minutes.  He walks about 1-3 miles a day.  He will occasionally also walk on the treadmill.  He notes his energy is better if he remains active.   Family History  Problem Relation Age of Onset  . Diabetes Mother        Died at 50  . Heart failure Father        05-04-2023 have had A. fib, died at 81  . Hypertension Brother        He is in his 3s  . CVA Maternal Grandmother        51s  . Colon cancer Neg Hx   . Esophageal cancer Neg Hx   . Rectal cancer Neg Hx   . Stomach cancer Neg Hx    ROS: All systems reviewed and  negative except as per HPI.   Current Outpatient Medications  Medication Sig Dispense Refill  . acetaminophen (TYLENOL) 325 MG tablet Take 650 mg by mouth every 6 (six) hours as needed.    [name] albuterol (VENTOLIN HFA) 108 (90 Base) MCG/ACT inhaler Inhale 1-2 puffs into the lungs every 6 (six) hours as needed for wheezing or shortness of breath. 1 Inhaler 6  . amiodarone (PACERONE) 200 MG tablet Take 1 tablet (200 mg total) by mouth daily. 90 tablet 3  . amiodarone (PACERONE) 400 MG tablet Take 1 tablet (400 mg total) by mouth 2 (two) times daily. Take 400 mg twice a day for 2 weeks. 28 tablet 0  . carvedilol (COREG) 3.125 MG tablet Take 1 tablet (3.125 mg total) by mouth 2 (two) times daily. 180 tablet 3  . Cholecalciferol (VITAMIN D3) 5000 units TABS Take 5,000 Units by mouth daily.     . digoxin (LANOXIN) 0.125 MG tablet Take 1 tablet (0.125 mg total) by mouth daily. 90 tablet 3  . furosemide (LASIX) 40 MG tablet Take 2 tablets (80 mg total) by mouth daily. 110 tablet 3  . hydrocortisone 2.5 % cream Apply 1 application topically 2 (two) times daily as needed (skin irritation/rash).     . Omega-3 Fatty Acids (FISH OIL) 1200 MG CAPS Take 1,200 mg by mouth daily.    . rivaroxaban (XARELTO) 20 MG TABS tablet TAKE 1 TABLET (20 MG TOTAL) BY MOUTH DAILY WITH SUPPER. 90 tablet 2  . Specialty Vitamins Products (ULTRA MAN PO) Take 1 tablet by mouth daily.     [name] spironolactone (ALDACTONE) 25 MG tablet Take 1 tablet (25 mg total) by mouth daily. 90 tablet 3  . sacubitril-valsartan (ENTRESTO) 24-26 MG Take 1 tablet by mouth 2 (two) times daily. 60 tablet 3   No current facility-administered medications for this encounter.    BP 128/72   Pulse 76   Wt 92.8 kg (204 lb 8 oz)   SpO2 99%   BMI 30.20 kg/m  General: NAD Neck: JVP 8-9 cm, no thyromegaly or thyroid nodule.  Lungs: Clear to auscultation bilaterally with normal respiratory effort. CV: Nondisplaced PMI.  Heart regular S1/S2, no S3/S4, no  murmur.  No peripheral edema.  No carotid bruit.  Normal pedal pulses.  Abdomen: Soft, nontender, no hepatosplenomegaly, no distention.  Skin: Intact without lesions or rashes.  Neurologic: Alert and oriented x 3.  Psych: Normal affect. Extremities: No clubbing or cyanosis.  HEENT: Normal.   Assessment/Plan: 1. Atrial fibrillation: Chronic, rate control is not ideal (HR actually around 100 bpm today).  I suspect atrial fibrillation contributes to his cardiomyopathy (atrial fibrillation-related CMP or tachy-mediated CMP).  Ideally, we would get him out of atrial fibrillation, but he has been in it since 2018 so may be difficult.  He has been started on amiodarone with plan for eventual DCCV.  - Continue amiodarone taper.  We will need to follow LFTs, TSH.  He will need a regular eye exam.  - DCCV planned for 08/19/19, he knows not to miss any Xarelto.  - If he goes back into NSR with DCCV, would given him a couple of months of NSR to see if he gets any atrial remodeling, then would consider atrial fibrillation ablation ([facility]LE-HF data).  - If he does not go back into NSR with DCCV, he will need CRT-D placement, possibly with AV nodal ablation.  - He had a prior sleep study that was negative for OSA.  2. Chronic systolic CHF: Nonischemic cardiomyopathy.  Echo in 8/20 with EF < 20%, paradoxical septal motion.  He has a LBBB, 156 msec on last ECG.  Cath in 9/20 with preserved cardiac output, no significant CAD. Cardiomyopathy may be due to atrial fibrillation itself versus tachy-mediated CMP, LBBB can also contribute to cardiomyopathy. NYHA class II-III with prominent orthopnea/PND.  He is mildly volume overloaded by exam, but REDS clip suggests more significant volume overload (41%).   - We need to get him back in NSR (see discussion above).  - Continue Lasix 80 qam/20 qpm (his home regimen) but will add Entresto 24/26 (This should help diuresis).  BMET in 10 days.  - Can stop KCl supplement.  -  Continue low dose Coreg, would not increase with volume overload.  - Continue digoxin, check level in 10 days with BMET.  - Continue spironolactone 25 mg daily.  - If he converts to NSR on amiodarone, will need repeat echo in 3 months with CRT-D if EF remains low.  If he does not convert to NSR, would proceed with CRT-D implantation.  Given baseline tachycardia, he may need AV nodal ablation in that case to properly BiV pace.  3. Hyperlipidemia: LDL quite high, but he has not tolerated statins or Zetia.   I will see him at [facility] on 10/9 and will likely adjust his meds then and reassess volume status.  He is distressed by the facilities fee that our clinic charges.  Even though insurance likely covers, he does not want to have to pay this "on principle."  Unless he changes his mind, he will have to followup with Dr. Ellyn Hack.   Loralie Champagne 08/04/2019

## 2019-08-04 NOTE — Progress Notes (Unsigned)
ReDS Vest - 08/04/19 1500      ReDS Vest   MR   Moderate    Estimated volume prior to reading  Med    Fitting Posture  Sitting    Height Marker  Tall    Center Strip  Aligned    ReDS Value  41    Anatomical Comments  station C

## 2019-08-15 ENCOUNTER — Other Ambulatory Visit: Payer: Self-pay

## 2019-08-15 DIAGNOSIS — I5042 Chronic combined systolic (congestive) and diastolic (congestive) heart failure: Secondary | ICD-10-CM

## 2019-08-15 NOTE — Progress Notes (Signed)
Recent Labs: 07/06/2019 Na+ 141, K+ 4.0, Cl- 105, HCO3-27, BUN 18, Cr 1.08, Glu 101, Ca2+ 9.5;

## 2019-08-16 ENCOUNTER — Other Ambulatory Visit (HOSPITAL_COMMUNITY)
Admission: RE | Admit: 2019-08-16 | Discharge: 2019-08-16 | Disposition: A | Discharge status: Home / Self Care | Payer: BC Managed Care – PPO | Source: Ambulatory Visit | Attending: Cardiology | Admitting: Cardiology

## 2019-08-16 DIAGNOSIS — Z20828 Contact with and (suspected) exposure to other viral communicable diseases: Secondary | ICD-10-CM | POA: Diagnosis not present

## 2019-08-16 DIAGNOSIS — Z01812 Encounter for preprocedural laboratory examination: Secondary | ICD-10-CM | POA: Insufficient documentation

## 2019-08-16 LAB — BASIC METABOLIC PANEL
BUN/Creatinine Ratio: 18 (ref 10–24)
BUN: 24 mg/dL (ref 8–27)
CO2: 28 mmol/L (ref 20–29)
Calcium: 9.6 mg/dL (ref 8.6–10.2)
Chloride: 101 mmol/L (ref 96–106)
Creatinine, Ser: 1.35 mg/dL — ABNORMAL HIGH (ref 0.76–1.27)
GFR calc Af Amer: 62 mL/min/{1.73_m2} (ref >59)
GFR calc non Af Amer: 54 mL/min/{1.73_m2} — ABNORMAL LOW (ref >59)
Glucose: 94 mg/dL (ref 65–99)
Potassium: 4.1 mmol/L (ref 3.5–5.2)
Sodium: 140 mmol/L (ref 134–144)

## 2019-08-17 LAB — NOVEL CORONAVIRUS, NAA (HOSP ORDER, SEND-OUT TO REF LAB; TAT 18-24 HRS): SARS-CoV-2, NAA: NOT DETECTED

## 2019-08-17 LAB — DIGITOXIN LEVEL: Digitoxin Lvl: <5 ng/mL — ABNORMAL LOW (ref 10–25)

## 2019-08-19 ENCOUNTER — Encounter (HOSPITAL_COMMUNITY)
Admission: RE | Disposition: A | Discharge status: Home / Self Care | Payer: Self-pay | Source: Home / Self Care | Attending: Cardiology

## 2019-08-19 ENCOUNTER — Ambulatory Visit (HOSPITAL_COMMUNITY): Payer: BC Managed Care – PPO | Admitting: Certified Registered"

## 2019-08-19 ENCOUNTER — Other Ambulatory Visit: Payer: Self-pay

## 2019-08-19 ENCOUNTER — Other Ambulatory Visit (HOSPITAL_COMMUNITY): Payer: Self-pay

## 2019-08-19 ENCOUNTER — Encounter (HOSPITAL_COMMUNITY): Payer: Self-pay

## 2019-08-19 ENCOUNTER — Ambulatory Visit (HOSPITAL_COMMUNITY)
Admission: RE | Admit: 2019-08-19 | Discharge: 2019-08-19 | Disposition: A | Discharge status: Home / Self Care | Payer: BC Managed Care – PPO | Attending: Cardiology | Admitting: Cardiology

## 2019-08-19 DIAGNOSIS — I447 Left bundle-branch block, unspecified: Secondary | ICD-10-CM | POA: Insufficient documentation

## 2019-08-19 DIAGNOSIS — I5042 Chronic combined systolic (congestive) and diastolic (congestive) heart failure: Secondary | ICD-10-CM

## 2019-08-19 DIAGNOSIS — K219 Gastro-esophageal reflux disease without esophagitis: Secondary | ICD-10-CM | POA: Diagnosis not present

## 2019-08-19 DIAGNOSIS — Z7901 Long term (current) use of anticoagulants: Secondary | ICD-10-CM | POA: Diagnosis not present

## 2019-08-19 DIAGNOSIS — I4891 Unspecified atrial fibrillation: Secondary | ICD-10-CM

## 2019-08-19 DIAGNOSIS — I4819 Other persistent atrial fibrillation: Secondary | ICD-10-CM | POA: Diagnosis not present

## 2019-08-19 DIAGNOSIS — I482 Chronic atrial fibrillation, unspecified: Secondary | ICD-10-CM | POA: Insufficient documentation

## 2019-08-19 DIAGNOSIS — I420 Dilated cardiomyopathy: Secondary | ICD-10-CM | POA: Diagnosis not present

## 2019-08-19 DIAGNOSIS — I428 Other cardiomyopathies: Secondary | ICD-10-CM | POA: Diagnosis not present

## 2019-08-19 DIAGNOSIS — Z79899 Other long term (current) drug therapy: Secondary | ICD-10-CM | POA: Diagnosis not present

## 2019-08-19 DIAGNOSIS — I5022 Chronic systolic (congestive) heart failure: Secondary | ICD-10-CM | POA: Insufficient documentation

## 2019-08-19 DIAGNOSIS — Z8673 Personal history of transient ischemic attack (TIA), and cerebral infarction without residual deficits: Secondary | ICD-10-CM | POA: Diagnosis not present

## 2019-08-19 DIAGNOSIS — E785 Hyperlipidemia, unspecified: Secondary | ICD-10-CM | POA: Insufficient documentation

## 2019-08-19 HISTORY — PX: CARDIOVERSION: SHX1299

## 2019-08-19 LAB — POCT I-STAT, CHEM 8
BUN: 19 mg/dL (ref 8–23)
Calcium, Ion: 1.1 mmol/L — ABNORMAL LOW (ref 1.15–1.40)
Chloride: 100 mmol/L (ref 98–111)
Creatinine, Ser: 1.4 mg/dL — ABNORMAL HIGH (ref 0.61–1.24)
Glucose, Bld: 103 mg/dL — ABNORMAL HIGH (ref 70–99)
HCT: 48 % (ref 39.0–52.0)
Hemoglobin: 16.3 g/dL (ref 13.0–17.0)
Potassium: 4 mmol/L (ref 3.5–5.1)
Sodium: 139 mmol/L (ref 135–145)
TCO2: 26 mmol/L (ref 22–32)

## 2019-08-19 SURGERY — CARDIOVERSION
Anesthesia: General

## 2019-08-19 MED ORDER — PROPOFOL 10 MG/ML IV BOLUS
INTRAVENOUS | Status: DC | PRN
  Administered 2019-08-19: 80 mg via INTRAVENOUS

## 2019-08-19 MED ORDER — SODIUM CHLORIDE 0.9 % IV SOLN
INTRAVENOUS | Status: DC | PRN
  Administered 2019-08-19: 09:00:00 via INTRAVENOUS

## 2019-08-19 MED ORDER — FUROSEMIDE 40 MG PO TABS: 80.0000 mg | ORAL_TABLET | Freq: Two times a day (BID) | ORAL | 3 refills | Status: DC

## 2019-08-19 MED ORDER — AMIODARONE HCL 200 MG PO TABS: 200.0000 mg | ORAL_TABLET | Freq: Every day | ORAL | 3 refills | Status: DC

## 2019-08-19 MED ORDER — BENZONATATE 100 MG PO CAPS: 100.0000 mg | ORAL_CAPSULE | Freq: Four times a day (QID) | ORAL | 0 refills | Status: AC | PRN

## 2019-08-19 MED ORDER — LIDOCAINE 2% (20 MG/ML) 5 ML SYRINGE
INTRAMUSCULAR | Status: DC | PRN
  Administered 2019-08-19: 80 mg via INTRAVENOUS

## 2019-08-19 MED ORDER — FUROSEMIDE 40 MG PO TABS: ORAL_TABLET | ORAL | 3 refills | Status: DC

## 2019-08-19 NOTE — Progress Notes (Signed)
Spoke with patient, aware of need for lab results at Dr Darcus Pester office in 1 week.  Pt was also scheduled for an appt with Dr Darcus Pester PA on 10/28 at 3:15p.  Ordered for an echo for 2 months per DM. Pt aware they will call him to schedule this appointment. Verbalized understanding.

## 2019-08-19 NOTE — Transfer of Care (Signed)
Immediate Anesthesia Transfer of Care Note  Patient: Randall Best  Procedure(s) Performed: CARDIOVERSION (N/A )  Patient Location: Endoscopy Unit  Anesthesia Type:General  Level of Consciousness: awake  Airway & Oxygen Therapy: Patient Spontanous Breathing  Post-op Assessment: Report given to RN and Post -op Vital signs reviewed and stable  Post vital signs: Reviewed and stable  Last Vitals:  Vitals Value Taken Time  BP    Temp    Pulse    Resp    SpO2      Last Pain:  Vitals:   08/19/19 0858  TempSrc: Oral  PainSc: 0-No pain         Complications: No apparent anesthesia complications

## 2019-08-19 NOTE — Progress Notes (Addendum)
While in recovery, pt c/o numbness on left side. Neuro assessment performed. No movement of extremities on left side. Pt unable to move facial muscles on left. MD Aundra Dubin notified and at bedside at this time. 1045 Pt's neuro assessment back to baseline, MD [facility] aware.

## 2019-08-19 NOTE — Progress Notes (Signed)
Pt will have labs drawn at Dr Darcus Pester office in [place] in 1 week, he can go as a walk-in, labs ordered.   Pt will also be seen by Dr Ellyn Hack or APP in 3-4 weeks for med titration. Appt made for 10/28 3:15pm.  Pt ordered for an echo in 2 months at [address].  Will call patient after dc from hospital today to discuss.

## 2019-08-19 NOTE — Anesthesia Postprocedure Evaluation (Signed)
Anesthesia Post Note  Patient: Randall Best  Procedure(s) Performed: CARDIOVERSION (N/A )     Patient location during evaluation: PACU Anesthesia Type: General Level of consciousness: awake and alert Pain management: pain level controlled Vital Signs Assessment: post-procedure vital signs reviewed and stable Respiratory status: spontaneous breathing, nonlabored ventilation and respiratory function stable Cardiovascular status: blood pressure returned to baseline and stable Postop Assessment: no apparent nausea or vomiting Anesthetic complications: no    Last Vitals:  Vitals:   08/19/19 1038 08/19/19 1048  BP: 117/77 115/70  Pulse: (!) 49 (!) 48  Resp: 20 19  Temp:    SpO2: 100% 98%    Last Pain:  Vitals:   08/19/19 1048  TempSrc:   PainSc: 0-No pain                 Audry Pili

## 2019-08-19 NOTE — Progress Notes (Signed)
1100 Pt c/o headache 7/10 and general weakness. MD Aundra Dubin notified, verbal orders received. 1105 Pt states headache is improving and feels "back to normal." Pt does not wish to stay in recovery area for prn analgesia, he does not feel like he needs any at this time. MD Aundra Dubin made aware and okay with pt's discharge. Spoke with pt's wife upon discharge about events and educated patient about calling 911 and returning to emergency room with any return of symptoms.

## 2019-08-19 NOTE — Discharge Instructions (Signed)

## 2019-08-19 NOTE — Interval H&P Note (Signed)
History and Physical Interval Note:  08/19/2019 10:17 AM  Randall Best  has presented today for surgery, with the diagnosis of a-fib.  The various methods of treatment have been discussed with the patient and family. After consideration of risks, benefits and other options for treatment, the patient has consented to  Procedure(s): CARDIOVERSION (N/A) as a surgical intervention.  The patient's history has been reviewed, patient examined, no change in status, stable for surgery.  I have reviewed the patient's chart and labs.  Questions were answered to the patient's satisfaction.     Dessiree Sze [employer]

## 2019-08-19 NOTE — Procedures (Signed)
Electrical Cardioversion Procedure Note JAQUANE PENSINGER [id number] 1951/11/06  Procedure: Electrical Cardioversion Indications:  Atrial Fibrillation  Procedure Details Consent: Risks of procedure as well as the alternatives and risks of each were explained to the (patient/caregiver).  Consent for procedure obtained. Time Out: Verified patient identification, verified procedure, site/side was marked, verified correct patient position, special equipment/implants available, medications/allergies/relevent history reviewed, required imaging and test results available.  Performed  Patient placed on cardiac monitor, pulse oximetry, supplemental oxygen as necessary.  Sedation given: Propofol per anesthesiology Pacer pads placed anterior and posterior chest.  Cardioverted 1 time(s).  Cardioverted at [place].  Evaluation Findings: Post procedure EKG shows: NSR Complications: None Patient did tolerate procedure well.   Loralie Champagne 08/19/2019, 10:24 AM

## 2019-08-19 NOTE — Anesthesia Preprocedure Evaluation (Addendum)
Anesthesia Evaluation  Patient identified by MRN, date of birth, ID band Patient awake    Reviewed: Allergy & Precautions, NPO status , Patient's Chart, lab work & pertinent test results  History of Anesthesia Complications Negative for: history of anesthetic complications  Airway Mallampati: II  TM Distance: >3 FB Neck ROM: Full    Dental  (+) Teeth Intact   Pulmonary asthma ,    Pulmonary exam normal        Cardiovascular +CHF  + dysrhythmias Atrial Fibrillation  Rhythm:Irregular Rate:Normal   '20 Cath - Angiographically normal coronary arteries There is severe left ventricular systolic dysfunction. The left ventricular ejection fraction is less than 25% by visual estimate by visual   '20 TTE - EF <20%. Diffuse hypokinesis with septal dyskinesis. Mild concentric left ventricular hypertrophy. Diastolic function is indeterminate due to atrial fibrillation. The right ventricle has mildly reduced systolic function. RV cavity was mildly enlarged. Right ventricular systolic pressure is mildly elevated with an estimated pressure of 37.0 mmHg. Left atrial size was mildly dilated. Right atrial size was moderately dilated. There is dilatation of the ascending aorta measuring 42 mm    Neuro/Psych  Headaches, TIAnegative psych ROS   GI/Hepatic Neg liver ROS, GERD  Controlled,  Endo/Other   Obesity   Renal/GU Renal InsufficiencyRenal disease     Musculoskeletal negative musculoskeletal ROS (+)   Abdominal   Peds  Hematology negative hematology ROS (+)   Anesthesia Other Findings   Reproductive/Obstetrics                           Anesthesia Physical Anesthesia Plan  ASA: IV  Anesthesia Plan: General   Post-op Pain Management:    Induction: Intravenous  PONV Risk Score and Plan: 2 and Treatment may vary due to age or medical condition and Propofol infusion  Airway Management Planned: Mask  and Natural Airway  Additional Equipment: None  Intra-op Plan:   Post-operative Plan:   Informed Consent: I have reviewed the patients History and Physical, chart, labs and discussed the procedure including the risks, benefits and alternatives for the proposed anesthesia with the patient or authorized representative who has indicated his/her understanding and acceptance.       Plan Discussed with: CRNA and Anesthesiologist  Anesthesia Plan Comments:        Anesthesia Quick Evaluation

## 2019-08-19 NOTE — Progress Notes (Signed)
  In recovery, patient developed left-sided weakness and numbness.  After a few minutes, this resolved completely.  Patient had similar symptoms, but considerably worse and lasting longer after DCCV in 3/19.  He had a full workup in 3/19, no CVA and thought to be possibly a migraine aura.  He did, of note, have a mild headache this morning prior to procedure.  As he was completely back to normal after < 5 minutes and this happened before, I am not going to work up further at this time.   I will increase his Lasix to 80 mg bid given some residual volume overload on exam and cough.  He will continue his other medications (Entresto, Coreg, spironolactone, digoxin).  - BMET and digoxin level to be done next week.   Continue amiodarone 200 mg daily to maintain NSR.  Would recommend eventual afib ablation.   Needs echo in 2 months (after DCCV).  If EF remains < 35% at that time, recommend CRT-D implantation. I will arrange echo at [facility] office.   He will need followup with Dr. Ellyn Hack or APP in 3-4 weeks for medication titration (increase Entresto).  I will arrange.  He will not come back to CHF clinic because of the facility fee that is charged.    Loralie Champagne 08/19/2019 10:58 AM

## 2019-08-19 NOTE — Anesthesia Procedure Notes (Signed)
Procedure Name: General with mask airway Date/Time: 08/19/2019 10:18 AM Performed by: Orlie Dakin, CRNA Pre-anesthesia Checklist: Patient identified, Emergency Drugs available, Suction available and Patient being monitored Patient Re-evaluated:Patient Re-evaluated prior to induction Oxygen Delivery Method: Ambu bag Preoxygenation: Pre-oxygenation with 100% oxygen Induction Type: IV induction

## 2019-08-21 ENCOUNTER — Encounter (HOSPITAL_COMMUNITY): Payer: Self-pay | Admitting: Cardiology

## 2019-08-22 ENCOUNTER — Telehealth: Payer: Self-pay | Admitting: Cardiology

## 2019-08-22 DIAGNOSIS — Z23 Encounter for immunization: Secondary | ICD-10-CM | POA: Diagnosis not present

## 2019-08-22 NOTE — Telephone Encounter (Signed)
Patient aware it is okay to have shingrx injection tody.

## 2019-08-22 NOTE — Telephone Encounter (Signed)
ew Message:   Pt wants to know if it is alright form him to get a [employer]? Please let him know asap, he wants to get it today please.to h

## 2019-08-22 NOTE — Telephone Encounter (Signed)
That's about it.  No cardiac contraindications

## 2019-08-25 ENCOUNTER — Other Ambulatory Visit: Payer: Self-pay

## 2019-08-25 ENCOUNTER — Telehealth: Payer: Self-pay | Admitting: *Deleted

## 2019-08-25 ENCOUNTER — Ambulatory Visit (INDEPENDENT_AMBULATORY_CARE_PROVIDER_SITE_OTHER): Payer: BC Managed Care – PPO | Admitting: Cardiology

## 2019-08-25 VITALS — BP 110/72 | HR 48 | Ht 71.0 in | Wt 207.0 lb

## 2019-08-25 DIAGNOSIS — I4819 Other persistent atrial fibrillation: Secondary | ICD-10-CM

## 2019-08-25 DIAGNOSIS — I420 Dilated cardiomyopathy: Secondary | ICD-10-CM

## 2019-08-25 DIAGNOSIS — I5042 Chronic combined systolic (congestive) and diastolic (congestive) heart failure: Secondary | ICD-10-CM | POA: Diagnosis not present

## 2019-08-25 MED ORDER — ENTRESTO 49-51 MG PO TABS: 1.0000 | ORAL_TABLET | Freq: Two times a day (BID) | ORAL | 6 refills | Status: DC

## 2019-08-25 NOTE — Patient Instructions (Addendum)
Medication Instructions:   INCREASE ENTRESTO  49/51  MG  - UNTIL YOU SEE K. LAWRENCE DNP  START TAKING 24/26 MG  IN THE MORNING  AND 49/51 MG IN THE EVENING.   *If you need a refill on your cardiac medications before your next appointment, please call your pharmacy*  Lab Work: [place]   If you have labs (blood work) drawn today and your tests are completely normal, you will receive your results only by: [name] MyChart Message (if you have MyChart) OR . A paper copy in the mail If you have any lab test that is abnormal or we need to change your treatment, we will call you to review the results.  Testing/Procedures:  Not needed  Follow-Up: At [facility], you and your health needs are our priority.  As part of our continuing mission to provide you with exceptional heart care, we have created designated Provider Care Teams.  These Care Teams include your primary Cardiologist (physician) and Advanced Practice Providers (APPs -  Physician Assistants and Nurse Practitioners) who all work together to provide you with the care you need, when you need it.  Your next appointment:   keep appointment for Dec 2020 after echo  The format for your next appointment:   In Person  Provider:   [name], MD  Other Instructions  [place]

## 2019-08-25 NOTE — Progress Notes (Signed)
PCP: Camille Bal, PA-C  Clinic Note: Chief Complaint  Patient presents with   Hospitalization Follow-up    Follow-up after cardioversion   Cardiomyopathy    Nonischemic   Congestive Heart Failure    Combined systolic and diastolic   Atrial Fibrillation    Recent DCCV on amiodarone     HPI:   Randall Best is a 68 y.o. male with a PMH below who presents today for close f/u with recent diagnosis of nonischemic cardiomyopathy with recurrent A. fib and left bundle branch block.   Cardiac History  Dx - Afib (Afib RVR in Jan 2019) -> failed TEE/DCCV (complicated by TIA Sx) .  Planned Rate control with Diltiazem.  Telehealth visit 02/2019: noted episodes of "smothering spells when lying down", DOE & dry hacking cough. --> diltiazem restarted & started lasix.  June f/u -> still noting "smothering" & DOE. Rate still poorly controlled. --> 2 D Echo ordered (but due to COVID delayed until Aug). --> Echo in Aug - EF <20% with global HK, EKG now showed LBBB with Afib RVR -> urgent f/u  9/3 - f/u - scheduled R&LHC followed by EP c/s; diltiazem d/c'd, started Carvedilol (not much BP) & Lasix increased.  R&LHC - normal coronaries. PCWP & LVEDP 17 mmHg. CO-I: 4.92-2.34 -> Lasix 40 mg BID on Tue/Thurs with 40 mg on other days.;  Started digoxin and spironolactone 12.5 mg daily  9/18: Felt better with increased Lasix and being on Coreg.  Still has PND and orthopnea.  Edema better.  No chest pain.  Increased to 25 mg spironolactone.  Lasix made 40 mg twice daily and as needed based on sliding scale.  9/22: EP c/s - Dr. Curt Bears: Concern for Sx of Low Output HF -noted weakness, fatigue and exertional dyspnea but improved edema and orthopnea/PND.  + cough.  Still asymptomatic of A. Fib. --> referred to [facility] (Dr.McLean) for NYHA Cl III HF  Determined that we need to get him out of Afib if possible - but likely needs additional "tune-up".  -- started on Amiodarone  Would  be CRT-D candidate if EF still down on best tolerated meds   9/24: Adv HF Aundra Dubin) -> still noted dry cough, PND & orthopnea with DOE ~ mod exertion. Drinks lots of water.  (pt concerned about facility fees for CHF clinic)  Continued Amiodarone - scheduled for fasciliated DCCV 10/9 (thought for ? Afib Ablation) & potential CRT-D.  Thought processes by the EP still needed to restore sinus rhythm on the chance that restoring sinus rhythm will improve EF.  If NSR restored --> recheck Echo in ~3 months: if EF < 35% --> refer for CRT-D,   If not able to DCCV, would likely proceed with crt-D & ? AVN ablation to allow BiV pacing.  Continued Carvedilol, digoxin, spironolactone  REDS clip suggested - 41% volume overload.Lasix increased to 80 mg AM & 20 mg PM  Entresto 24/26 started --> plan for close f/u with Dr. Ellyn Hack for titration.  [name] was last seen on 10/9 by Dr. Algernon Huxley for DCCV. -->  Successful cardioversion again a complicated by left-sided weakness and numbness (less significant than in March 2019) back to normal within 5 minutes.  Increased to 80 mg twice daily Lasix with be met in digoxin level to be ordered after 1 week.  Continued amiodarone 200 mg daily to maintain NSR. --> recommend Afib Ablation.   F/u Echo ordered.  Recent Hospitalizations: for DCCV  Reviewed  CV studies:   The following studies were reviewed today: (if available, images/films reviewed: From Epic Chart or Care Everywhere)  DCCV 10/9:   Interval History:   Randall Best returns here today following cardioversion.  He is taking Lasix 80 mg in the morning and 40 mg p.m. of Lasix.  He ses to be doing okay from a BP standpoint with Entresto. He says that the cough and smothering are much better.  He still has some fatigue and is tired near the end of the day.  Edema has been pretty well controlled now.  He is very happy with how much attention he has gotten in the edition of second and third  opinions.  He was happy with both Dr. Curt Bears and Dr. Aundra Dubin.  He still does not notice a difference of being in or out of A. fib with exception of him feeling better in general.  He denies any chest pain or pressure now.  His PND and orthopnea symptoms are now notably improved.  No sense of rapid irregular heartbeats or palpitations.  No syncope/near syncope or TIA since amaurosis fugax.  He completely recovered from the symptoms noted post cardioversion.  The patient does not have symptoms concerning for COVID-19 infection (fever, chills, cough, or new shortness of breath).  The patient is practicing social distancing. +++ Masking.  Minimizing outings for groceries/shopping.  REVIEWED OF SYSTEMS   ROS: A comprehensive was performed. Review of Systems  Constitutional: Positive for malaise/fatigue (Still tired at the end of the day). Negative for weight loss.  HENT: Negative for nosebleeds.   Respiratory: Positive for cough (Notably improved). Negative for shortness of breath and wheezing.   Gastrointestinal: Negative for blood in stool, heartburn and melena.  Genitourinary: Negative for hematuria.  Musculoskeletal: Negative for falls and joint pain.  Neurological: Negative for dizziness and weakness.  Psychiatric/Behavioral: Negative for memory loss. The patient has insomnia (Not sleeping very well, but better now that he is not having any orthopnea). The patient is not nervous/anxious.   All other systems reviewed and are negative.  I have reviewed and (if needed) personally updated the patient's problem list, medications, allergies, past medical and surgical history, social and family history.   PAST MEDICAL HISTORY   Past Medical History:  Diagnosis Date   Asthma    as child   Atrial fibrillation, rapid ([place]) 12/08/2017   Diagnosed during colonoscopy -presumably new onset   GERD (gastroesophageal reflux disease)    use meds prn   Glaucoma    Headache    remote h/o  migraines, none in years   Hyperlipidemia    NICM (nonischemic cardiomyopathy) ([place]) 04/2019   Echo 05/2019 - EF reduced to <20%.  Has permanent Afib & LBBB (normal Coronaries on Cath).    TIA (transient ischemic attack) 01/13/2018   Ventral hernia      PAST SURGICAL HISTORY   Past Surgical History:  Procedure Laterality Date   CARDIAC EVENT MONITOR  12/2016   showed persistent Afib -monitor return in early because of persistent A. fib.  Several episodes of severe RVR with rates in the 170s were noted.    CARDIOVERSION N/A 01/13/2018   Procedure: CARDIOVERSION;  Surgeon: Pixie Casino, MD;  Location: [facility] ENDOSCOPY;  Service: Cardiovascular;  Laterality: N/A; unsuccessful.-->  Post-cardioversion, the patient had left-sided facial numbness and weakness.  Ruled out for stroke.   CARDIOVERSION N/A 08/19/2019   Procedure: CARDIOVERSION;  Surgeon: Larey Dresser, MD;  Location: [facility] ENDOSCOPY;  Service:  Cardiovascular;  Laterality: N/A;   INSERTION OF MESH N/A 07/22/2017   Procedure: INSERTION OF MESH;  Surgeon: [name], MD;  Location: [place];  Service: General;  Laterality: N/A;  BILATERAL TAP BLOCK   NM MYOVIEW LTD  12/2017   Shows reduced EF, but no evidence of ischemia or infarction.  EF 40 and 45%.  INTERMEDIATE RISK due to reduced function.  EF notably better on echo (50 and 55%)   RIGHT/LEFT HEART CATH AND CORONARY ANGIOGRAPHY N/A 07/14/2019   Procedure: RIGHT/LEFT HEART CATH AND CORONARY ANGIOGRAPHY;  Surgeon: [name], MD;  Location: [place] CV LAB;; Angiographically normal coronary arteries. RHC  - PCWP & LVEDP 17 mmHg.  CO-CI 4.92-2.34   TEE WITHOUT CARDIOVERSION N/A 01/13/2018   Procedure: TRANSESOPHAGEAL ECHOCARDIOGRAM (TEE) with cardioversion - ;  Surgeon: Pixie Casino, MD;  Location: [facility] ENDOSCOPY;  Service: Cardiovascular;  Laterality: N/A;  Mild concentric LVH.  EF 55 to 60%.  No R WMA.  Mild ascending aortic dilation of 4.2 cm.  Dilated left atrium.   No thrombus.   TONSILLECTOMY     TRANSTHORACIC ECHOCARDIOGRAM  12/2017    EF 50 and 55%.  No regional wall motion normalities.   TRANSTHORACIC ECHOCARDIOGRAM  06/2019    Severely reduced function with diffuse hypokinesis and septal dyskinesis.  EF <20%.  Cannot exclude LV thrombus.  Reduced RV function with mildly elevated RV P--37 mmHg.  Moderate RA and mild LA dilation.  A centimeter dilation 42 mm   VENTRAL HERNIA REPAIR N/A 07/22/2017   Procedure: OPEN VENTRAL HERNIA REPAIR WITH MESH ERAS PATHWAY;  Surgeon: [name], MD;  Location: [place];  Service: General;  Laterality: N/A;  BILATERAL TAP BLOCK     MEDICATIONS/ALLERGIES   Current Meds  Medication Sig   acetaminophen (TYLENOL) 325 MG tablet Take 650 mg by mouth every 6 (six) hours as needed.   albuterol (VENTOLIN HFA) 108 (90 Base) MCG/ACT inhaler Inhale 1-2 puffs into the lungs every 6 (six) hours as needed for wheezing or shortness of breath.   amiodarone (PACERONE) 200 MG tablet Take 1 tablet (200 mg total) by mouth daily.   benzonatate (TESSALON PERLES) 100 MG capsule Take 1 capsule (100 mg total) by mouth every 6 (six) hours as needed for cough.   carvedilol (COREG) 3.125 MG tablet Take 1 tablet (3.125 mg total) by mouth 2 (two) times daily.   Cholecalciferol (VITAMIN D3) 5000 units TABS Take 5,000 Units by mouth daily.    digoxin (LANOXIN) 0.125 MG tablet Take 1 tablet (0.125 mg total) by mouth daily.   furosemide (LASIX) 40 MG tablet Take 2 tablets (80 mg total) by mouth 2 (two) times daily.   rivaroxaban (XARELTO) 20 MG TABS tablet TAKE 1 TABLET (20 MG TOTAL) BY MOUTH DAILY WITH SUPPER.   Specialty Vitamins Products (ULTRA MAN PO) Take 1 tablet by mouth daily.    spironolactone (ALDACTONE) 25 MG tablet Take 1 tablet (25 mg total) by mouth daily.   [DISCONTINUED] sacubitril-valsartan (ENTRESTO) 24-26 MG Take 1 tablet by mouth 2 (two) times daily.    Allergies  Allergen Reactions   Ezetimibe Other  (See Comments)    Broke out in sores/ muscle pain   Omeprazole Nausea And Vomiting   Statins Other (See Comments)    Muscle pains, mouth sores     SOCIAL HISTORY/FAMILY HISTORY   Social History   Tobacco Use   Smoking status: Never Smoker   Smokeless tobacco: Never Used  Substance Use Topics  Alcohol use: Yes    Comment: Rarely   Drug use: No   Social History   Social History Narrative   He is a married father of 16, grandfather of 1.  Lives with his wife.   He does exercise regularly most days of the week for about 30 minutes.  He walks about 1-3 miles a day.  He will occasionally also walk on the treadmill.  He notes his energy is better if he remains active.    family history includes CVA in his maternal grandmother; Diabetes in his mother; Heart failure in his father; Hypertension in his brother.   OBJCTIVE -PE, EKG, labs   Wt Readings from Last 3 Encounters:  08/25/19 207 lb (93.9 kg)  08/19/19 204 lb (92.5 kg)  08/04/19 204 lb 8 oz (92.8 kg)    Physical Exam: BP 110/72    Pulse (!) 48    Ht '5\' 11"'$  (1.803 m)    Wt 207 lb (93.9 kg)    SpO2 98%    BMI 28.87 kg/m  Physical Exam  Constitutional: He appears well-developed and well-nourished. No distress.  Looks like he feels a whole lot better.  Not ill-appearing  HENT:  Head: Normocephalic and atraumatic.  Neck: Normal range of motion. Neck supple. Hepatojugular reflux (minimal) and JVD (~8 cm H20) present. Carotid bruit is not present.  Cardiovascular: Regular rhythm, S1 normal and normal pulses.  No extrasystoles are present. Bradycardia present. PMI is displaced (mild lateral displacement / sustained). Exam reveals distant heart sounds. Exam reveals no friction rub.  No murmur heard. ~split S2 vs. S4 gallop  Pulmonary/Chest: Effort normal and breath sounds normal. No respiratory distress. He has no wheezes. He has no rales.  Abdominal: Soft. Bowel sounds are normal. He exhibits no distension. There is no  abdominal tenderness. There is no rebound.  No HSM  Musculoskeletal: Normal range of motion.        General: No edema.  Neurological: He is alert.  Psychiatric: He has a normal mood and affect. Judgment and thought content normal.  Vitals reviewed.    Adult ECG Report  Rate: 48 ;  Rhythm: normal sinus rhythm and Nonspecific IVCD (not quite LBBB).  Cannot rule out anterior MI, age undetermined.  Inferolateral T wave inversions also noted.;   Narrative Interpretation: Similar to post DCCV EKG  Recent Labs: Labs from 10 9 show Cr 1.4, K+ 4.0, H/H 16.3/48.   ASSESSMENT/PLAN    Problem List Items Addressed This Visit    Persistent atrial fibrillation [facility]): CHA2DS2-VASc Score 2 (Chronic)    Very difficult persistent A. fib.  Thankfully were able to successfully cardiovert on amiodarone.  He has been doing very well for over a year and a half on simply rate control.  Would appear that recurrence of A. fib leading to the LBBB has made his EF worse.  Now status post cardioversion.  Remains on amiodarone for rhythm control as well as carvedilol and digoxin for rate control.  Plan: We will need to check digoxin levels today Pending results of follow-up echocardiogram, the plan is for him to return to Dr. Curt Bears to discuss A. fib ablation. (vs. Ablation & CRT-D) -- appt scheduled for 12/23.  Continue anticoagulation with Xarelto.      Relevant Medications   sacubitril-valsartan (ENTRESTO) 49-51 MG   Other Relevant Orders   EKG 12-Lead (Completed)   Basic metabolic panel (Completed)   Digoxin level (Completed)   Nonischemic dilated cardiomyopathy (HCC) - Primary (Chronic)  Clearly nonischemic based on cath.  Could this have been related to A. fib with poor control rate versus LBBB.   Is now on low-dose carvedilol (unable to titrate further because of bradycardia on amiodarone and digoxin)  Is on stable dose of spironolactone as well as furosemide with sliding scale discussed.  Was  started on low-dose Entresto by Dr. Marigene Ehlers --> plan for today is to titrate the higher dose--> we will have him take 49/51 mg Entresto for the p.m. dose and the original 24/26 mg per morning dose until he is seen in close follow-up by Jory Sims, DNP -> if blood pressure stable, would then titrate to the 49/51 mg dose twice daily  Has echocardiogram ordered for early December with close follow-up      Relevant Medications   sacubitril-valsartan (ENTRESTO) 49-51 MG   Other Relevant Orders   EKG 12-Lead (Completed)   Basic metabolic panel (Completed)   Digoxin level (Completed)   Chronic combined systolic and diastolic heart failure (HCC) (Chronic)    He does seem to be doing better.  Lasix dose was titrated up by Dr. Aundra Dubin and Delene Loll started.  Unable to further titrate carvedilol dose with heart rate 48 bpm.  Plan: Continue other medications and titrate Entresto --> He will take 24/26 mg in a.m. and 49/51 mg p.m. until seen on 10/28 by Jory Sims, DNP -> if blood pressure is greater than 100, will have been going to increase to the 49/50 mg twice daily  Prior authorization form completed.  Follow-up echo ordered for early December.      Relevant Medications   sacubitril-valsartan (ENTRESTO) 49-51 MG   Other Relevant Orders   EKG 12-Lead (Completed)   Basic metabolic panel (Completed)   Digoxin level (Completed)     BMP and digoxin level being checked today for titration of Entresto and medication management.  COVID-19 Education: The signs and symptoms of COVID-19 were discussed with the patient and how to seek care for testing (follow up with PCP or arrange E-visit).   The importance of social distancing was discussed today.  I spent a total of 25 minutes with the patient and chart review. >  50% of the time was spent in direct patient consultation.  Additional time spent with chart review (studies, outside notes, etc): 20 --> extensive review of his consultation  visits and procedures. Total Time: 45 min   Current medicines are reviewed at length with the patient today.  (+/- concerns) n/a   Patient Instructions / Medication Changes & Studies & Tests Ordered   Patient Instructions  Medication Instructions:   INCREASE ENTRESTO  49/51  MG  - UNTIL YOU SEE K. LAWRENCE DNP  START TAKING 24/26 MG  IN THE MORNING  AND 49/51 MG IN THE EVENING.   *If you need a refill on your cardiac medications before your next appointment, please call your pharmacy*  Lab Work: [place]   If you have labs (blood work) drawn today and your tests are completely normal, you will receive your results only by:  [place] (if you have MyChart) OR  A paper copy in the mail If you have any lab test that is abnormal or we need to change your treatment, we will call you to review the results.  Testing/Procedures:  Not needed  Follow-Up: At [facility], you and your health needs are our priority.  As part of our continuing mission to provide you with exceptional heart care, we have created  designated Provider Care Teams.  These Care Teams include your primary Cardiologist (physician) and Advanced Practice Providers (APPs -  Physician Assistants and Nurse Practitioners) who all work together to provide you with the care you need, when you need it.  Your next appointment:   keep appointment for Dec 2020 after echo  The format for your next appointment:   In Person  Provider:   [name], MD  Other Instructions  KEEP Casar DNP    Studies Ordered:   Orders Placed This Encounter  Procedures   Basic metabolic panel   Digoxin level   EKG 12-Lead     [name], M.D., M.S. Interventional Cardiologist   Pager # [phone] Phone # [phone] [address] [place], [place] [address]   Thank you for choosing Heartcare at [facility]!!

## 2019-08-25 NOTE — Telephone Encounter (Signed)
PRIOR AUTHORIZATION STARTED FOR ENTRESTO 52/51 MG TWICE A DAY    KEY  [id number]  DX -99991111 combined systolic and diastolic heart failure         I42.0 NONISCHEMIC CARDIOMYOPATHY    MEDICATION WAS APPROVED    PATIENT AWARE.

## 2019-08-26 LAB — BASIC METABOLIC PANEL
BUN/Creatinine Ratio: 14 (ref 10–24)
BUN: 16 mg/dL (ref 8–27)
CO2: 26 mmol/L (ref 20–29)
Calcium: 9.8 mg/dL (ref 8.6–10.2)
Chloride: 102 mmol/L (ref 96–106)
Creatinine, Ser: 1.12 mg/dL (ref 0.76–1.27)
GFR calc Af Amer: 78 mL/min/{1.73_m2} (ref >59)
GFR calc non Af Amer: 68 mL/min/{1.73_m2} (ref >59)
Glucose: 96 mg/dL (ref 65–99)
Potassium: 4 mmol/L (ref 3.5–5.2)
Sodium: 141 mmol/L (ref 134–144)

## 2019-08-26 LAB — DIGOXIN LEVEL: Digoxin, Serum: 0.8 ng/mL (ref 0.5–0.9)

## 2019-08-27 ENCOUNTER — Encounter: Payer: Self-pay | Admitting: Cardiology

## 2019-08-27 NOTE — Assessment & Plan Note (Signed)
Clearly nonischemic based on cath.  Could this have been related to A. fib with poor control rate versus LBBB.   Is now on low-dose carvedilol (unable to titrate further because of bradycardia on amiodarone and digoxin)  Is on stable dose of spironolactone as well as furosemide with sliding scale discussed.  Was started on low-dose Entresto by Dr. Marigene Ehlers --> plan for today is to titrate the higher dose--> we will have him take 49/51 mg Entresto for the p.m. dose and the original 24/26 mg per morning dose until he is seen in close follow-up by Jory Sims, DNP -> if blood pressure stable, would then titrate to the 49/51 mg dose twice daily  Has echocardiogram ordered for early December with close follow-up

## 2019-08-27 NOTE — Assessment & Plan Note (Addendum)
Very difficult persistent A. fib.  Thankfully were able to successfully cardiovert on amiodarone.  He has been doing very well for over a year and a half on simply rate control.  Would appear that recurrence of A. fib leading to the LBBB has made his EF worse.  Now status post cardioversion.  Remains on amiodarone for rhythm control as well as carvedilol and digoxin for rate control.  Plan: We will need to check digoxin levels today Pending results of follow-up echocardiogram, the plan is for him to return to Dr. Curt Bears to discuss A. fib ablation. (vs. Ablation & CRT-D) -- appt scheduled for 12/23.  Continue anticoagulation with Xarelto.

## 2019-08-27 NOTE — Assessment & Plan Note (Signed)
He does seem to be doing better.  Lasix dose was titrated up by Dr. Aundra Dubin and Delene Loll started.  Unable to further titrate carvedilol dose with heart rate 48 bpm.  Plan: Continue other medications and titrate Entresto --> He will take 24/26 mg in a.m. and 49/51 mg p.m. until seen on 10/28 by Jory Sims, DNP -> if blood pressure is greater than 100, will have been going to increase to the 49/50 mg twice daily  Prior authorization form completed.  Follow-up echo ordered for early December.

## 2019-08-30 ENCOUNTER — Telehealth: Payer: Self-pay | Admitting: Cardiology

## 2019-08-30 NOTE — Telephone Encounter (Signed)
°*  STAT* If patient is at the pharmacy, call can be transferred to refill team.   1. Which medications need to be refilled? (please list name of each medication and dose if known) furosemide (LASIX) 40 MG tablet,  2. Which pharmacy/location (including street and city if local pharmacy) is medication to be sent to? [id number] - [place], [place] - [address]  3. Do they need a 30 day or 90 day supply? 90 day   he states he takes two pills in the morning and 1 in the evening as needed.  He is out of meds

## 2019-08-30 NOTE — Telephone Encounter (Signed)
Contacted pharmacy to verify they received the rx that was sent over on the tenth.   Spoke with patient and told the pharmacy had his rx for Lasix ready to be picked up. He voiced understanding and requested a refill on his Entresto. Advised patient that I would speak with Dr Ellyn Hack and his nurse and give him a call back. He voiced understanding.   Spoke with Dr Ellyn Hack nurse and she stated patient is taking the 24/26 dose of Entresto in the am and 49/51mg  in the pm.

## 2019-08-30 NOTE — Telephone Encounter (Signed)
SPOKE WITH PATIENT - HE COUNTED MEDICATION ENTRESTO 24/26 MG . HE HAS 8 PILLS LEFT - THERE IS ENOUGH MEDICATION TO GET HIM  TO NEXT APPOINTMENT ON 09/07/19 WITH k LAWRENCE DNP. [name]  PER DR HARDING PATIENT IS  TAKING 24/26 MG DOSE IN THE MORNING AND 49/56 MG IN THE EVENING.

## 2019-09-02 ENCOUNTER — Other Ambulatory Visit: Payer: Self-pay | Admitting: Cardiology

## 2019-09-07 ENCOUNTER — Ambulatory Visit (INDEPENDENT_AMBULATORY_CARE_PROVIDER_SITE_OTHER): Payer: BC Managed Care – PPO | Admitting: Adult Health

## 2019-09-07 ENCOUNTER — Other Ambulatory Visit: Payer: Self-pay

## 2019-09-07 ENCOUNTER — Encounter: Payer: Self-pay | Admitting: Adult Health

## 2019-09-07 VITALS — BP 100/66 | HR 57 | Temp 96.6°F | Ht 71.0 in | Wt 205.0 lb

## 2019-09-07 DIAGNOSIS — Z79899 Other long term (current) drug therapy: Secondary | ICD-10-CM | POA: Diagnosis not present

## 2019-09-07 NOTE — Progress Notes (Signed)
Cardiology Office Note   Date:  09/07/2019   ID:  Randall Best, DOB Jan 09, 1951, MRN [id number]  PCP:  Randall Bal, PA-C  Cardiologist: Dr. Ellyn Best  CC: Follow Up   History of Present Illness: Randall Best is a 68 y.o. male who presents for ongoing assessment and management for nonischemic cardiomyopathy, combined systolic diastolic heart failure, atrial fibrillation with recent DCCV on amiodarone, and left bundle branch block.  He was last seen in the office by Dr. Ellyn Best on 08/25/2019 after cardioversion.  He was taking Lasix 80 mg in the morning and 40 mg in the evening.  He remained on Entresto.  He expressed that his cough and smothering sensation is much better on current medication regimen however he did complain of some generalized fatigue and being tired at the end of the day.  His edema was very well controlled.  The patient is unaware of irregular or rapid heart rhythm, and has not noticed any difference after having had cardioversion.  He was completely asymptomatic on that office visit.  He was to remain on amiodarone for rhythm control and carvedilol and digoxin for rate control.  Digoxin level was checked on the office visit, repeat echocardiogram was ordered, and he was routed to return to Randall Best to discuss A. fib ablation (versus ablation and CRT-D) in December (appointment to 11/02/2019.  He was to continue on Xarelto.  Dr. Ellyn Best was unable to further titrate carvedilol due to bradycardia on amiodarone and digoxin, he was continued on spironolactone and furosemide.  He was also to continue Entresto (started by Dr. Aundra Best  during recent hospitalization).  Dr. Ellyn Best increased his Entresto dose to 49/51 mg in the p.m. and continue 24/'26mg'$  n the AM.  If blood pressure was stable would increase the a.m. dose to 49/51 mg.  He comes today feeling great. He has changed his dosing of Entresto as above and state he feels much better. BP is 100/66 today.  He has some  mild dizziness with position change but no pre-syncope. His breathing is much better. He denies any significant LEE. He states he feels a "rush from my heart" sometimes, lasting seconds, without any associated symptoms.   He did say that he sometimes notices some bright red blood when he wipes himself after a bowel movement. He thinks it is from his hemorrhoids, and too aggressive cleaning. This happens rarely.    ______________________________________________________________ (Copied for accuracy from RandallHarding's note). Cardiac History  Dx - Afib (Afib RVR in Jan 2019) -> failed TEE/DCCV (complicated by TIA Sx) .  Planned Rate control with Diltiazem.  Telehealth visit 02/2019: noted episodes of "smothering spells when lying down", DOE & dry hacking cough. --> diltiazem restarted & started lasix.  June f/u -> still noting "smothering" & DOE. Rate still poorly controlled. --> 2 D Echo ordered (but due to COVID delayed until Aug). --> Echo in Aug - EF <20% with global HK, EKG now showed LBBB with Afib RVR -> urgent f/u  9/3 - f/u - scheduled R&LHC followed by EP c/s; diltiazem d/c'd, started Carvedilol (not much BP) & Lasix increased. ? R&LHC - normal coronaries. PCWP & LVEDP 17 mmHg. CO-I: 4.92-2.34 -> Lasix 40 mg BID on Tue/Thurs with 40 mg on other days.;  Started digoxin and spironolactone 12.5 mg daily  9/18: Felt better with increased Lasix and being on Coreg.  Still has PND and orthopnea.  Edema better.  No chest pain. ? Increased to 25 mg spironolactone.  Lasix  made 40 mg twice daily and as needed based on sliding scale.  9/22: EP c/s - Randall Best: Concern for Sx of Low Output HF -noted weakness, fatigue and exertional dyspnea but improved edema and orthopnea/PND.  + cough.  Still asymptomatic of A. Fib. --> referred to [facility] ([facility] for NYHA Cl III HF ? Determined that we need to get him out of Afib if possible - but likely needs additional "tune-up".  -- started on  Amiodarone ? Would be CRT-D candidate if EF still down on best tolerated meds   9/24: Adv HF Randall Best) -> still noted dry cough, PND & orthopnea with DOE ~ mod exertion. Drinks lots of water.  (pt concerned about facility fees for CHF clinic) ? Continued Amiodarone - scheduled for fasciliated DCCV 10/9 (thought for ? Afib Ablation) & potential CRT-D.  Thought processes by the EP still needed to restore sinus rhythm on the chance that restoring sinus rhythm will improve EF.  If NSR restored --> recheck Echo in ~3 months: if EF < 35% --> refer for CRT-D,   If not able to DCCV, would likely proceed with crt-D & ? AVN ablation to allow BiV pacing. ? Continued Carvedilol, digoxin, spironolactone ? REDS clip suggested - 41% volume overload.Lasix increased to 80 mg AM & 20 mg PM ? Entresto 24/26 started --> plan for close f/u with Dr. Ellyn Best for titration.  Randall Best was last seen on 10/9 by Dr. Algernon Best for DCCV. -->  Successful cardioversion again a complicated by left-sided weakness and numbness (less significant than in March 2019) back to normal within 5 minutes.  Increased to 80 mg twice daily Lasix with be met in digoxin level to be ordered after 1 week.  Continued amiodarone 200 mg daily to maintain NSR. --> recommend Afib Ablation.   F/u Echo ordered.  Recent Hospitalizations: for DCCV   Past Medical History:  Diagnosis Date   Asthma    as child   Atrial fibrillation, rapid (Randall Best) 12/08/2017   Diagnosed during colonoscopy -presumably new onset   GERD (gastroesophageal reflux disease)    use meds prn   Glaucoma    Headache    remote h/o migraines, none in years   Hyperlipidemia    NICM (nonischemic cardiomyopathy) ([facility]) 04/2019   Echo 05/2019 - EF reduced to <20%.  Has permanent Afib & LBBB (normal Coronaries on Cath).    TIA (transient ischemic attack) 01/13/2018   Ventral hernia     Past Surgical History:  Procedure Laterality Date   CARDIAC EVENT  MONITOR  12/2016   showed persistent Afib -monitor return in early because of persistent A. fib.  Several episodes of severe RVR with rates in the 170s were noted.    CARDIOVERSION N/A 01/13/2018   Procedure: CARDIOVERSION;  Surgeon: Pixie Casino, MD;  Location: [facility] ENDOSCOPY;  Service: Cardiovascular;  Laterality: N/A; unsuccessful.-->  Post-cardioversion, the patient had left-sided facial numbness and weakness.  Ruled out for stroke.   CARDIOVERSION N/A 08/19/2019   Procedure: CARDIOVERSION;  Surgeon: Larey Dresser, MD;  Location: [facility] ENDOSCOPY;  Service: Cardiovascular;  Laterality: N/A;   INSERTION OF MESH N/A 07/22/2017   Procedure: INSERTION OF MESH;  Surgeon: [name], MD;  Location: [place];  Service: General;  Laterality: N/A;  BILATERAL TAP BLOCK   NM MYOVIEW LTD  12/2017   Shows reduced EF, but no evidence of ischemia or infarction.  EF 40 and 45%.  INTERMEDIATE RISK due to reduced function.  EF notably better on echo (50 and 55%)   RIGHT/LEFT HEART CATH AND CORONARY ANGIOGRAPHY N/A 07/14/2019   Procedure: RIGHT/LEFT HEART CATH AND CORONARY ANGIOGRAPHY;  Surgeon: [name], MD;  Location: [place] [facility];; Angiographically normal coronary arteries. RHC  - PCWP & LVEDP 17 mmHg.  CO-CI 4.92-2.34   TEE WITHOUT CARDIOVERSION N/A 01/13/2018   Procedure: TRANSESOPHAGEAL ECHOCARDIOGRAM (TEE) with cardioversion - ;  Surgeon: Pixie Casino, MD;  Location: [facility] ENDOSCOPY;  Service: Cardiovascular;  Laterality: N/A;  Mild concentric LVH.  EF 55 to 60%.  No R WMA.  Mild ascending aortic dilation of 4.2 cm.  Dilated left atrium.  No thrombus.   TONSILLECTOMY     TRANSTHORACIC ECHOCARDIOGRAM  12/2017    EF 50 and 55%.  No regional wall motion normalities.   TRANSTHORACIC ECHOCARDIOGRAM  06/2019    Severely reduced function with diffuse hypokinesis and septal dyskinesis.  EF <20%.  Cannot exclude LV thrombus.  Reduced RV function with mildly elevated RV P--37 mmHg.  Moderate RA  and mild LA dilation.  A centimeter dilation 42 mm   VENTRAL HERNIA REPAIR N/A 07/22/2017   Procedure: OPEN VENTRAL HERNIA REPAIR WITH MESH ERAS PATHWAY;  Surgeon: [name], MD;  Location: [place];  Service: General;  Laterality: N/A;  BILATERAL TAP BLOCK     Current Outpatient Medications  Medication Sig Dispense Refill   acetaminophen (TYLENOL) 325 MG tablet Take 650 mg by mouth every 6 (six) hours as needed.     albuterol (VENTOLIN HFA) 108 (90 Base) MCG/ACT inhaler Inhale 1-2 puffs into the lungs every 6 (six) hours as needed for wheezing or shortness of breath. 1 Inhaler 6   amiodarone (PACERONE) 200 MG tablet Take 1 tablet (200 mg total) by mouth daily. 90 tablet 3   benzonatate (TESSALON PERLES) 100 MG capsule Take 1 capsule (100 mg total) by mouth every 6 (six) hours as needed for cough. 30 capsule 0   carvedilol (COREG) 3.125 MG tablet Take 1 tablet (3.125 mg total) by mouth 2 (two) times daily. 180 tablet 3   Cholecalciferol (VITAMIN D3) 5000 units TABS Take 5,000 Units by mouth daily.      digoxin (LANOXIN) 0.125 MG tablet Take 1 tablet (0.125 mg total) by mouth daily. 90 tablet 3   ENTRESTO 24-26 MG Take 2 tablets by mouth daily.      furosemide (LASIX) 40 MG tablet Take 2 tablets (80 mg total) by mouth 2 (two) times daily. 110 tablet 3   rivaroxaban (XARELTO) 20 MG TABS tablet TAKE 1 TABLET (20 MG TOTAL) BY MOUTH DAILY WITH SUPPER. 90 tablet 2   sacubitril-valsartan (ENTRESTO) 49-51 MG Take 2 tablets by mouth daily.     Specialty Vitamins Products (ULTRA MAN PO) Take 1 tablet by mouth daily.      spironolactone (ALDACTONE) 25 MG tablet Take 1 tablet (25 mg total) by mouth daily. 90 tablet 3   No current facility-administered medications for this visit.     Allergies:   Ezetimibe, Omeprazole, and Statins    Social History:  The patient  reports that he has never smoked. He has never used smokeless tobacco. He reports current alcohol use. He reports that he  does not use drugs.   Family History:  The patient's family history includes CVA in his maternal grandmother; Diabetes in his mother; Heart failure in his father; Hypertension in his brother.    ROS: All other systems are reviewed and negative. Unless otherwise mentioned in H&P  PHYSICAL EXAM: VS:  BP 100/66    Pulse (!) 57    Temp (!) 96.6 F (35.9 C)    Ht _0  (1.803 m)    Wt 205 lb (93 kg)    SpO2 97%    BMI 28.59 kg/m  , BMI Body mass index is 28.59 kg/m. GEN: Well nourished, well developed, in no acute distress HEENT: normal Neck: no JVD, carotid bruits, or masses Cardiac: RRR; no murmurs, rubs, or gallops,no edema He has varicose veins bilateral LEE.  Respiratory:  Clear to auscultation bilaterally, normal work of breathing GI: soft, nontender, nondistended, + BS MS: no deformity or atrophy Skin: warm and dry, no rash.Mild venous stasis skin changes without thickening.  Neuro:  Strength and sensation are intact Psych: euthymic mood, full affect   EKG:  Sinus bradycardia . HR of 54 bpm. LBBB.   Recent Labs: 07/14/2019: Platelets 239 08/02/2019: ALT 33; TSH 1.530 08/19/2019: Hemoglobin 16.3 08/25/2019: BUN 16; Creatinine, Ser 1.12; Potassium 4.0; Sodium 141    Lipid Panel    Component Value Date/Time   CHOL 153 01/14/2018 0631   TRIG 97 01/14/2018 0631   HDL 32 (L) 01/14/2018 0631   CHOLHDL 4.8 01/14/2018 0631   VLDL 19 01/14/2018 0631   LDLCALC 102 (H) 01/14/2018 0631      Wt Readings from Last 3 Encounters:  09/07/19 205 lb (93 kg)  08/25/19 207 lb (93.9 kg)  08/19/19 204 lb (92.5 kg)      Other studies Reviewed: LHC/RHC 07/14/2019  Angiographically normal coronary arteries  ---------------------  There is severe left ventricular systolic dysfunction. The left ventricular ejection fraction is less than 25% by visual estimate by visual estimation  Relatively compensated right heart cath pressures with PCWP and LVEDP of 17 mmHg.  Cardiac Output-Index  Kathlen Brunswick) 4.92-2.34   SUMMARY  Nonischemic cardiomyopathy with essentially normal coronary arteries  Relatively compensated CHF with cardiac output 4.92, index 2.34; PCWP and LVEDP ~17 mmHg   ASSESSMENT AND PLAN:  1. Non-ischemic Cardiomyopathy:  Overall doing well. I cannot increase Entresto dose at this time to avoid hypotension. He is at optimal BP today for his reduced EF.   He is tolerating the higher dose of Entresto in the evening and lower dose in the morning much better. Continue lasix 80 mg BID with daily wts and salt avoidance. He states his weight runs around 204-205 lbs. When it rises to 208-210 lbs he takes extra dose of lasix as directed.   I will check a BMET in 3 weeks. Last creatinine 1.12.   He is due to see Randall Best for discussion of implantation of CRT-D in December. Echocardiogram is planned for October 11, 2019.   2. Atrial Fib: S/P DCCV. Remains in NSR with LBBB. He continues on amiodarone 200 mg daily and digoxin. He offers no complaints of irregular HR or palpitations. I will check TSH in 3 weeks with BMET and CBC. He will continue Xarelto 20 mg as directed CHADS VASC Score of 3.   He has had some bright red blood after wiping himself too aggressively, due to hemorrhoids.  I have advised him to let us know if this occurs frequently.  .   Current medicines are reviewed at length with the patient today.    Labs/ tests ordered today include: BMET, CBC, TSH in 3 weeks, and repeat BMET, CBC, and TSH December 1st with echocardiogram prior to seeing Dr. Ellyn Best.    Phill Myron. West Pugh, ANP, AACC  09/07/2019 4:29 PM    [address] Office [phone] Fax [phone]  Notice: This dictation was prepared with Dragon dictation along with smaller phrase technology. Any transcriptional errors that result from this process are unintentional and may not be corrected upon review.

## 2019-09-07 NOTE — Patient Instructions (Addendum)
Medication Instructions:  Continue current medications  *If you need a refill on your cardiac medications before your next appointment, please call your pharmacy*  Lab Work: CBC, TSH, BMP in 3 weeks CBC, TSH, BMP on December 1st with Echo  If you have labs (blood work) drawn today and your tests are completely normal, you will receive your results only by: [name] MyChart Message (if you have MyChart) OR . A paper copy in the mail If you have any lab test that is abnormal or we need to change your treatment, we will call you to review the results.  Testing/Procedures: None Ordered  Follow-Up: At [facility], you and your health needs are our priority.  As part of our continuing mission to provide you with exceptional heart care, we have created designated Provider Care Teams.  These Care Teams include your primary Cardiologist (physician) and Advanced Practice Providers (APPs -  Physician Assistants and Nurse Practitioners) who all work together to provide you with the care you need, when you need it.  Your next appointment:   Keep appointment with Dr Ellyn Hack on December 7th @ 10:40 am

## 2019-09-08 ENCOUNTER — Other Ambulatory Visit (INDEPENDENT_AMBULATORY_CARE_PROVIDER_SITE_OTHER): Payer: BC Managed Care – PPO

## 2019-09-08 DIAGNOSIS — I420 Dilated cardiomyopathy: Secondary | ICD-10-CM

## 2019-09-08 DIAGNOSIS — I4891 Unspecified atrial fibrillation: Secondary | ICD-10-CM

## 2019-09-08 DIAGNOSIS — Z79899 Other long term (current) drug therapy: Secondary | ICD-10-CM | POA: Diagnosis not present

## 2019-09-08 DIAGNOSIS — I5042 Chronic combined systolic (congestive) and diastolic (congestive) heart failure: Secondary | ICD-10-CM

## 2019-09-08 DIAGNOSIS — I4819 Other persistent atrial fibrillation: Secondary | ICD-10-CM | POA: Diagnosis not present

## 2019-09-08 DIAGNOSIS — I872 Venous insufficiency (chronic) (peripheral): Secondary | ICD-10-CM

## 2019-09-08 DIAGNOSIS — R0609 Other forms of dyspnea: Secondary | ICD-10-CM

## 2019-09-08 DIAGNOSIS — R0600 Dyspnea, unspecified: Secondary | ICD-10-CM

## 2019-09-27 DIAGNOSIS — Z79899 Other long term (current) drug therapy: Secondary | ICD-10-CM | POA: Diagnosis not present

## 2019-09-28 LAB — BASIC METABOLIC PANEL
BUN/Creatinine Ratio: 16 (ref 10–24)
BUN: 17 mg/dL (ref 8–27)
CO2: 27 mmol/L (ref 20–29)
Calcium: 9.7 mg/dL (ref 8.6–10.2)
Chloride: 101 mmol/L (ref 96–106)
Creatinine, Ser: 1.07 mg/dL (ref 0.76–1.27)
GFR calc Af Amer: 83 mL/min/{1.73_m2} (ref >59)
GFR calc non Af Amer: 71 mL/min/{1.73_m2} (ref >59)
Glucose: 89 mg/dL (ref 65–99)
Potassium: 3.8 mmol/L (ref 3.5–5.2)
Sodium: 141 mmol/L (ref 134–144)

## 2019-09-28 LAB — CBC
Hematocrit: 38.4 % (ref 37.5–51.0)
Hemoglobin: 13.1 g/dL (ref 13.0–17.7)
MCH: 29.8 pg (ref 26.6–33.0)
MCHC: 34.1 g/dL (ref 31.5–35.7)
MCV: 87 fL (ref 79–97)
Platelets: 192 10*3/uL (ref 150–450)
RBC: 4.4 x10E6/uL (ref 4.14–5.80)
RDW: 13.3 % (ref 11.6–15.4)
WBC: 8 10*3/uL (ref 3.4–10.8)

## 2019-09-28 LAB — TSH: TSH: 1.86 u[IU]/mL (ref 0.450–4.500)

## 2019-10-11 ENCOUNTER — Other Ambulatory Visit: Payer: Self-pay

## 2019-10-11 ENCOUNTER — Ambulatory Visit (HOSPITAL_COMMUNITY): Payer: BC Managed Care – PPO | Attending: Cardiovascular Disease

## 2019-10-11 DIAGNOSIS — I420 Dilated cardiomyopathy: Secondary | ICD-10-CM | POA: Diagnosis not present

## 2019-10-11 DIAGNOSIS — I5042 Chronic combined systolic (congestive) and diastolic (congestive) heart failure: Secondary | ICD-10-CM | POA: Diagnosis not present

## 2019-10-11 DIAGNOSIS — I4819 Other persistent atrial fibrillation: Secondary | ICD-10-CM

## 2019-10-11 DIAGNOSIS — Z79899 Other long term (current) drug therapy: Secondary | ICD-10-CM | POA: Diagnosis not present

## 2019-10-11 HISTORY — PX: TRANSTHORACIC ECHOCARDIOGRAM: SHX275

## 2019-10-11 LAB — BASIC METABOLIC PANEL
BUN/Creatinine Ratio: 17 (ref 10–24)
BUN: 20 mg/dL (ref 8–27)
CO2: 25 mmol/L (ref 20–29)
Calcium: 9.6 mg/dL (ref 8.6–10.2)
Chloride: 99 mmol/L (ref 96–106)
Creatinine, Ser: 1.19 mg/dL (ref 0.76–1.27)
GFR calc Af Amer: 73 mL/min/{1.73_m2} (ref >59)
GFR calc non Af Amer: 63 mL/min/{1.73_m2} (ref >59)
Glucose: 105 mg/dL — ABNORMAL HIGH (ref 65–99)
Potassium: 4 mmol/L (ref 3.5–5.2)
Sodium: 138 mmol/L (ref 134–144)

## 2019-10-11 LAB — TSH: TSH: 2.04 u[IU]/mL (ref 0.450–4.500)

## 2019-10-11 LAB — CBC
Hematocrit: 38.6 % (ref 37.5–51.0)
Hemoglobin: 13.1 g/dL (ref 13.0–17.7)
MCH: 30.8 pg (ref 26.6–33.0)
MCHC: 33.9 g/dL (ref 31.5–35.7)
MCV: 91 fL (ref 79–97)
Platelets: 200 10*3/uL (ref 150–450)
RBC: 4.25 x10E6/uL (ref 4.14–5.80)
RDW: 14.1 % (ref 11.6–15.4)
WBC: 7.7 10*3/uL (ref 3.4–10.8)

## 2019-10-17 ENCOUNTER — Other Ambulatory Visit: Payer: Self-pay

## 2019-10-17 ENCOUNTER — Ambulatory Visit (INDEPENDENT_AMBULATORY_CARE_PROVIDER_SITE_OTHER): Payer: BC Managed Care – PPO | Admitting: Cardiology

## 2019-10-17 VITALS — BP 117/75 | HR 55 | Ht 71.0 in | Wt 215.0 lb

## 2019-10-17 DIAGNOSIS — H2513 Age-related nuclear cataract, bilateral: Secondary | ICD-10-CM | POA: Diagnosis not present

## 2019-10-17 DIAGNOSIS — I5043 Acute on chronic combined systolic (congestive) and diastolic (congestive) heart failure: Secondary | ICD-10-CM

## 2019-10-17 DIAGNOSIS — I712 Thoracic aortic aneurysm, without rupture, unspecified: Secondary | ICD-10-CM

## 2019-10-17 DIAGNOSIS — I4819 Other persistent atrial fibrillation: Secondary | ICD-10-CM | POA: Diagnosis not present

## 2019-10-17 DIAGNOSIS — I420 Dilated cardiomyopathy: Secondary | ICD-10-CM

## 2019-10-17 DIAGNOSIS — H25013 Cortical age-related cataract, bilateral: Secondary | ICD-10-CM | POA: Diagnosis not present

## 2019-10-17 NOTE — Progress Notes (Signed)
Primary Care Provider: Camille Bal, PA-C Cardiologist: [name], MD Electrophysiologist:   Clinic Note: Chief Complaint  Patient presents with  . Follow-up    Status post cardioversion  . Cardiomyopathy    Nonischemic  . Congestive Heart Failure    Have a little more the "swelling" sensation  . Atrial Fibrillation    No signs of recurrence.     HPI:    Randall Best is a 68 y.o. male with a PMH below who presents today for 2 month f/u for NICM, LBBB & PAF (now on amiodarone) .   Cardiac History  Dx - Afib (Afib RVR in Jan 2019) ->failed TEE/DCCV (complicated by TIA Sx) . Planned Rate control with Diltiazem.  Telehealth visit 02/2019: noted episodes of "smothering spells when lying down", DOE & dry hacking cough. --> diltiazem restarted & started lasix.  June f/u ->still noting "smothering" &DOE. Rate still poorly controlled. -->2 D Echo ordered (but due to COVID delayed until Aug). -->Echo in Aug - EF <20%with global HK, EKG now showed LBBB with Afib RVR -> urgent f/u  9/3 - f/u - scheduled R&LHC followed by EP c/s; diltiazem d/c'd, started Carvedilol (not much BP) & Lasix increased. ? R&LHC - normal coronaries. PCWP & LVEDP 17 mmHg. CO-I:4.92-2.34-> Lasix 40 mg BID on Tue/Thurs with 40 mg on other days.;Started digoxin and spironolactone 12.5 mg daily  9/22: EP c/s - Dr. Curt Bears: Concern for Sx of Low Output HF -noted weakness, fatigue and exertional dyspnea but improved edema and orthopnea/PND.+ cough. Still asymptomatic of A. Fib. --> referred to [facility] (Dr.McLean) for NYHA Cl III HF ? Determined that we need to get him out of Afib if possible - but likely needs additional "tune-up". -- started on Amiodarone ? Would be CRT-D candidate if EF still down on best tolerated meds   9/24: Adv HF Aundra Dubin) ->still noted dry cough, PND &orthopnea with DOE ~ mod exertion. Drinks lots of water. (pt concerned about facility fees for CHF  clinic) ? Continued Amiodarone - scheduled for fasciliated DCCV 10/9 (thought for ? Afib Ablation) & potential CRT-D.Thought processes by the EP still needed to restore sinus rhythm on the chance that restoring sinus rhythm will improve EF.  If NSR restored -->recheck Echo in ~3 months: if EF <35% -->refer for CRT-D,   If not able to DCCV, would likely proceed with crt-D & ? AVN ablation to allow BiV pacing. ? Continued Carvedilol, digoxin, spironolactone & started on Entesto.   MYQUAN VITO was last seen on 09/07/2019 by Jory Sims, DNP - Echo ordered.  (Due for f/u EP visit with Dr. Curt Bears 12/23 -to discuss results of echo.).  At that time he was feeling great.  Blood pressure was borderline with some mild dizziness and position changes.  Occasional "rushing sensation in his heart. ? Continued on 80 mg twice daily Lasix with daily weights and salt avoidance.  Home dry weight of 204-205 pounds. ? No further changes. ?  Recent Hospitalizations: n/a  Reviewed  CV studies:    The following studies were reviewed today: (if available, images/films reviewed: From Epic Chart or Care Everywhere) . Echo 10/11/2019: EF up to 35-40%.-Moderate-severely decreased function.  "GR 1 DD ".  Normal atrial sizes.  Moderate ascending aorta-44 mm.  Relatively normal valves.  Interval History:   Randall Best states that he has been doing fair well up until maybe the last 2 days or so when he is now starting to  notice a little bit more the smothering sensation.  He says his weight is a lot higher than he usually has noted but not really having any significant edema.  He does have some orthopnea and PND which is the "smothering sensation".  No chest pain or pressure.  He does not notice any irregular flip-flopping skipped beats.  No blood in the stool but her stools are bloody urine.  He is due for routine eye evaluation.  CV Review of Symptoms (Summary) positive for - dyspnea on exertion,  orthopnea, paroxysmal nocturnal dyspnea and Described as a "smothering sensation ".  Also notes weight is up but no edema. negative for - chest pain, edema, irregular heartbeat, palpitations, rapid heart rate, shortness of breath or Syncope/near syncope, TIA/amaurosis fugax, claudication  The patient does not have symptoms concerning for COVID-19 infection (fever, chills, cough, or new shortness of breath).  The patient is practicing social distancing. ++ Masking.  When necessary he goes out for groceries/shopping.   REVIEWED OF SYSTEMS   A comprehensive ROS was performed. Review of Systems  Constitutional: Positive for malaise/fatigue (With smothering sensation he feels somewhat tired and fatigued.  This just picked up of the last week). Negative for weight loss (Weight gain).  HENT: Negative for congestion and nosebleeds.   Respiratory: Positive for shortness of breath (With exertion). Negative for cough and wheezing.   Cardiovascular: Positive for orthopnea.  Gastrointestinal: Negative for blood in stool, heartburn and melena.  Genitourinary: Negative for hematuria.  Musculoskeletal: Negative for falls and joint pain.  Neurological: Negative for dizziness, focal weakness and headaches.  Endo/Heme/Allergies: Negative for environmental allergies. Does not bruise/bleed easily.  Psychiatric/Behavioral: Negative for depression and memory loss. The patient is nervous/anxious (Started get anxious again about the smothering). The patient does not have insomnia.    I have reviewed and (if needed) personally updated the patient's problem list, medications, allergies, past medical and surgical history, social and family history.   PAST MEDICAL HISTORY   Past Medical History:  Diagnosis Date  . Asthma    as child  . Atrial fibrillation, rapid ([place]) 12/08/2017   Diagnosed during colonoscopy -presumably new onset  . GERD (gastroesophageal reflux disease)    use meds prn  . Glaucoma   .  Headache    remote h/o migraines, none in years  . Hyperlipidemia   . NICM (nonischemic cardiomyopathy) ([place]) 04/2019   Echo 05/2019 - EF reduced to <20%.  Has permanent Afib & LBBB (normal Coronaries on Cath). -->  After cardioversion and titration medications, EF up to 35-40% with  global hypokinesis..  . Thoracic aortic aneurysm ([place]) 10/20/2019   Ascending thoracic aortic estimated 44 mm on echo  . TIA (transient ischemic attack) 01/13/2018  . Ventral hernia      PAST SURGICAL HISTORY   Past Surgical History:  Procedure Laterality Date  . CARDIAC EVENT MONITOR  12/2016   showed persistent Afib -monitor return in early because of persistent A. fib.  Several episodes of severe RVR with rates in the 170s were noted.   [name] CARDIOVERSION N/A 01/13/2018   Procedure: CARDIOVERSION;  Surgeon: Pixie Casino, MD;  Location: [facility] ENDOSCOPY;  Service: Cardiovascular;  Laterality: N/A; unsuccessful.-->  Post-cardioversion, the patient had left-sided facial numbness and weakness.  Ruled out for stroke.  [name] CARDIOVERSION N/A 08/19/2019   Procedure: CARDIOVERSION;  Surgeon: Larey Dresser, MD;  Location: [facility] ENDOSCOPY;  Service: Cardiovascular;  Laterality: N/A;  . INSERTION OF MESH N/A 07/22/2017   Procedure: INSERTION  OF MESH;  Surgeon: [name], MD;  Location: [place];  Service: General;  Laterality: N/A;  BILATERAL TAP BLOCK  . NM MYOVIEW LTD  12/2017   Shows reduced EF, but no evidence of ischemia or infarction.  EF 40 and 45%.  INTERMEDIATE RISK due to reduced function.  EF notably better on echo (50 and 55%)  . RIGHT/LEFT HEART CATH AND CORONARY ANGIOGRAPHY N/A 07/14/2019   Procedure: RIGHT/LEFT HEART CATH AND CORONARY ANGIOGRAPHY;  Surgeon: [name], MD;  Location: [place] CV LAB;; Angiographically normal coronary arteries. RHC  - PCWP & LVEDP 17 mmHg.  CO-CI 4.92-2.34  . TEE WITHOUT CARDIOVERSION N/A 01/13/2018   Procedure: TRANSESOPHAGEAL ECHOCARDIOGRAM (TEE) with cardioversion - ;   Surgeon: Pixie Casino, MD;  Location: [facility] ENDOSCOPY;  Service: Cardiovascular;  Laterality: N/A;  Mild concentric LVH.  EF 55 to 60%.  No R WMA.  Mild ascending aortic dilation of 4.2 cm.  Dilated left atrium.  No thrombus.  . TONSILLECTOMY    . TRANSTHORACIC ECHOCARDIOGRAM  10/11/2019   EF up to 35-40%.-Moderate-severely decreased function.  "GR 1 DD ".  Normal atrial sizes.  Moderate ascending aorta-44 mL.  Relatively normal valves.  . TRANSTHORACIC ECHOCARDIOGRAM  06/2019    Severely reduced function with diffuse hypokinesis and septal dyskinesis.  EF <20%.  Cannot exclude LV thrombus.  Reduced RV function with mildly elevated RV P--37 mmHg.  Moderate RA and mild LA dilation.  A centimeter dilation 42 mm  . VENTRAL HERNIA REPAIR N/A 07/22/2017   Procedure: OPEN VENTRAL HERNIA REPAIR WITH MESH ERAS PATHWAY;  Surgeon: [name], MD;  Location: [place];  Service: General;  Laterality: N/A;  BILATERAL TAP BLOCK     MEDICATIONS/ALLERGIES   Current Meds  Medication Sig  . acetaminophen (TYLENOL) 325 MG tablet Take 650 mg by mouth every 6 (six) hours as needed.  [name] amiodarone (PACERONE) 200 MG tablet Take 1 tablet (200 mg total) by mouth daily.  . benzonatate (TESSALON PERLES) 100 MG capsule Take 1 capsule (100 mg total) by mouth every 6 (six) hours as needed for cough.  . Cholecalciferol (VITAMIN D3) 5000 units TABS Take 5,000 Units by mouth daily.   . digoxin (LANOXIN) 0.125 MG tablet Take 1 tablet (0.125 mg total) by mouth daily.  [name] ENTRESTO 24-26 MG Take 2 tablets by mouth daily. Take one tablet in the morning.  . furosemide (LASIX) 40 MG tablet Take 2 tablets (80 mg total) by mouth 2 (two) times daily.  . potassium chloride SA (KLOR-CON M20) 20 MEQ tablet Take 20 mEq by mouth daily.  . rivaroxaban (XARELTO) 20 MG TABS tablet TAKE 1 TABLET (20 MG TOTAL) BY MOUTH DAILY WITH SUPPER.  . sacubitril-valsartan (ENTRESTO) 49-51 MG Take 1 tablet by mouth daily. Take one tablet at night.  [name]  Specialty Vitamins Products (ULTRA MAN PO) Take 1 tablet by mouth daily.   [name] spironolactone (ALDACTONE) 25 MG tablet Take 1 tablet (25 mg total) by mouth daily.  . [DISCONTINUED] albuterol (VENTOLIN HFA) 108 (90 Base) MCG/ACT inhaler Inhale 1-2 puffs into the lungs every 6 (six) hours as needed for wheezing or shortness of breath.    Allergies  Allergen Reactions  . Ezetimibe Other (See Comments)    Broke out in sores/ muscle pain  . Omeprazole Nausea And Vomiting  . Statins Other (See Comments)    Muscle pains, mouth sores     SOCIAL HISTORY/FAMILY HISTORY   Social History   Tobacco Use  .  Smoking status: Never Smoker  . Smokeless tobacco: Never Used  Substance Use Topics  . Alcohol use: Yes    Comment: Rarely  . Drug use: No   Social History   Social History Narrative   He is a married father of 8, grandfather of 1.  Lives with his wife.   He does exercise regularly most days of the week for about 30 minutes.  He walks about 1-3 miles a day.  He will occasionally also walk on the treadmill.  He notes his energy is better if he remains active.    Family History family history includes CVA in his maternal grandmother; Diabetes in his mother; Heart failure in his father; Hypertension in his brother.   OBJCTIVE -PE, EKG, labs   Wt Readings from Last 3 Encounters:  10/17/19 215 lb (97.5 kg)  09/07/19 205 lb (93 kg)  08/25/19 207 lb (93.9 kg)    Physical Exam: BP 117/75   Pulse (!) 55   Ht 5\' 11"  (1.803 m)   Wt 215 lb (97.5 kg)   SpO2 98%   BMI 29.99 kg/m  Physical Exam  Constitutional: He appears well-developed and well-nourished. No distress.  Healthy-appearing.  Well-groomed.  HENT:  Head: Normocephalic and atraumatic.  Neck: Normal range of motion. Neck supple. Hepatojugular reflux (May be up to 8 cm H2O) and JVD (Roughly 8 cm water.) present. Carotid bruit is not present. No thyromegaly present.  Cardiovascular: Regular rhythm, S1 normal and intact distal  pulses.  No extrasystoles are present. Bradycardia present. PMI is not displaced (May be slightly lateral but not sustained). Exam reveals gallop, S4 and distant heart sounds. Exam reveals no friction rub.  No murmur heard. Likely split S2.  Difficult to tell dysplastic versus gallop.  Pulmonary/Chest: Effort normal and breath sounds normal. No respiratory distress. He has no wheezes. He has no rales.  Abdominal: Soft. Bowel sounds are normal. He exhibits no distension. There is no abdominal tenderness. There is no rebound.  No obvious HSM  Vitals reviewed.    Adult ECG Report  Rate: 53;  Rhythm: sinus bradycardia and LBBB-wide; otherwise normal axis, intervals and durations.;   Narrative Interpretation: Stable EKG  Recent Labs:   Lab Results  Component Value Date   CHOL 153 01/14/2018   HDL 32 (L) 01/14/2018   LDLCALC 102 (H) 01/14/2018   TRIG 97 01/14/2018   CHOLHDL 4.8 01/14/2018   Lab Results  Component Value Date   CREATININE 1.19 10/11/2019   BUN 20 10/11/2019   NA 138 10/11/2019   K 4.0 10/11/2019   CL 99 10/11/2019   CO2 25 10/11/2019    ASSESSMENT/PLAN    Problem List Items Addressed This Visit    Persistent atrial fibrillation ([place]): CHA2DS2-VASc Score 4 (CHF, HTN, Age 40, Aortic Plaque) (Chronic)    Very symptomatic and difficult control.  Now on amiodarone for rhythm control because of significantly worsening EF.  Need to maintain sinus rhythm.  Hopefully he can maintain following cardioversion.  Monitor for signs of bradycardia as this would lead to referral for most likely BiV pacer possible ICD.  Is due to see Dr. Curt Bears on December 23, but with improved EF of 35 to 40%, I doubt that he would be considered a candidate for BiV ICD unless there is an indication for pacer.  Plan: Continue amiodarone and low-dose carvedilol. On Xarelto for anticoagulation with no bleeding issues.      Relevant Orders   EKG 12-Lead (Completed)   Nonischemic  dilated  cardiomyopathy (HCC) (Chronic)    Clearly nonischemic.  Probably related to the combination of A. fib and left bundle branch block.  Thankfully his EF has improved, but this now leaves [place] in the limbo of 35 to 40% which likely 1 not make him a candidate for an ICD.  This is unfortunate, because BiV ICD would be a good option for him given his left bundle branch block.  Plan: He is on max tolerable dose of Entresto which is actually low-dose in the morning and intermediate dose at night along with low-dose carvedilol, digoxin in addition to his spironolactone and standing Lasix.      Relevant Orders   EKG 12-Lead (Completed)   Thoracic aortic aneurysm (HCC) (Chronic)    Interesting, this is the first time he was seen in the about dilated aorta.  Need to monitor for signs of aortic insufficiency as well.  Would probably want to consider 57-month follow-up of CTA chest abdomen pelvis to get a full evaluation of the aorta.      Acute on chronic combined systolic and diastolic CHF (congestive heart failure) ([place]) - Primary    His weight is up about 10 pounds from baseline.  I suspect that probably has somebody with recent holiday of Thanksgiving.  Somewhat indolent increase in weight.  I recommended he continue to make himself daily and adjust his Lasix accordingly as previously scheduled.  For now, we need to be a little aggressive with increasing his dosing.  TODAY - take an extra Spironloactone tablet  and lasix 80 mg  Tomorrow- take 120 mg lasix in the morning  And 80 mg  In the evening Wednesday  And Thursday - 80 mg  Twice a day Friday take regular dose of lasix Dry weight  Should be 205 lbs If weights is higher than 205 lbs - take 80 mg twice a day until   weight is 205 lbs then take 80 mg in the morning and 40 mg in the evening  each time you take an extra lasix take an extra potassium pill.      Relevant Orders   EKG 12-Lead (Completed)       COVID-19 Education: The signs and  symptoms of COVID-19 were discussed with the patient and how to seek care for testing (follow up with PCP or arrange E-visit).   The importance of social distancing was discussed today.  I spent a total of 26 minutes with the patient and chart review. >  50% of the time was spent in direct patient consultation.  Additional time spent with chart review (studies, outside notes, etc): 10 Total Time: 36 min   Current medicines are reviewed at length with the patient today.  (+/- concerns) n/a   Patient Instructions / Medication Changes & Studies & Tests Ordered   Patient Instructions  Medication Instructions:   TODAY - take an extra Spironloactone tablet  and lasix 80 mg  Tomorrow- take 120 mg lasix in the morning  And 80 mg  In the evening Wednesday  And Thursday - 80 mg  Twice a day Friday take regular dose of lasix Dry weight  Should be 205 lbs If weights is higher than 205 lbs - take 80 mg twice a day until   weight is 205 lbs then take 80 mg in the morning and 40 mg in the evening  each time you take an extra lasix take an extra potassium pill.  *If you need a refill on your  cardiac medications before your next appointment, please call your pharmacy*  Lab Work: Not needed If you have labs (blood work) drawn today and your tests are completely normal, you will receive your results only by: [name] MyChart Message (if you have MyChart) OR . A paper copy in the mail If you have any lab test that is abnormal or we need to change your treatment, we will call you to review the results.  Testing/Procedures: Not needed  Follow-Up: At [facility], you and your health needs are our priority.  As part of our continuing mission to provide you with exceptional heart care, we have created designated Provider Care Teams.  These Care Teams include your primary Cardiologist (physician) and Advanced Practice Providers (APPs -  Physician Assistants and Nurse Practitioners) who all work together to  provide you with the care you need, when you need it.  Your next appointment:   1 month(s)  The format for your next appointment:   In Person  Provider:   [name], MD  Other Instructions     Studies Ordered:   Orders Placed This Encounter  Procedures  . EKG 12-Lead     [name], M.D., M.S. Interventional Cardiologist   Pager # [phone] Phone # [phone] [address]. [place], [place] [address]   Thank you for choosing Heartcare at [facility]!!

## 2019-10-17 NOTE — Patient Instructions (Addendum)
Medication Instructions:   TODAY - take an extra Spironloactone tablet  and lasix 80 mg  Tomorrow- take 120 mg lasix in the morning  And 80 mg  In the evening Wednesday  And Thursday - 80 mg  Twice a day Friday take regular dose of lasix Dry weight  Should be 205 lbs If weights is higher than 205 lbs - take 80 mg twice a day until   weight is 205 lbs then take 80 mg in the morning and 40 mg in the evening  each time you take an extra lasix take an extra potassium pill.  *If you need a refill on your cardiac medications before your next appointment, please call your pharmacy*  Lab Work: Not needed If you have labs (blood work) drawn today and your tests are completely normal, you will receive your results only by: [name] MyChart Message (if you have MyChart) OR . A paper copy in the mail If you have any lab test that is abnormal or we need to change your treatment, we will call you to review the results.  Testing/Procedures: Not needed  Follow-Up: At [facility], you and your health needs are our priority.  As part of our continuing mission to provide you with exceptional heart care, we have created designated Provider Care Teams.  These Care Teams include your primary Cardiologist (physician) and Advanced Practice Providers (APPs -  Physician Assistants and Nurse Practitioners) who all work together to provide you with the care you need, when you need it.  Your next appointment:   1 month(s)  The format for your next appointment:   In Person  Provider:   [name], MD  Other Instructions

## 2019-10-18 ENCOUNTER — Telehealth: Payer: Self-pay | Admitting: Cardiology

## 2019-10-18 DIAGNOSIS — H35372 Puckering of macula, left eye: Secondary | ICD-10-CM | POA: Diagnosis not present

## 2019-10-18 DIAGNOSIS — H43392 Other vitreous opacities, left eye: Secondary | ICD-10-CM | POA: Diagnosis not present

## 2019-10-18 DIAGNOSIS — H35341 Macular cyst, hole, or pseudohole, right eye: Secondary | ICD-10-CM | POA: Diagnosis not present

## 2019-10-18 DIAGNOSIS — H43812 Vitreous degeneration, left eye: Secondary | ICD-10-CM | POA: Diagnosis not present

## 2019-10-18 NOTE — Telephone Encounter (Signed)
° °  [facility] Pre-operative Risk Assessment    Request for surgical clearance:  1. What type of surgery is being performed? Retina Surgery on R Eye  2. When is this surgery scheduled? TBD Based on Clearance   3. What type of clearance is required (medical clearance vs. Pharmacy clearance to hold med vs. Both)? Both   4. Are there any medications that need to be held prior to surgery and how long? Xarelto 4-5 days prior and resume 24 hrs after surgery  5. Practice name and name of physician performing surgery? [facility], Dr. Baird Cancer  6. What is your office phone number: [phone]   1.   What is your office fax number: [phone]  8.   Anesthesia type (None, local, MAC, general) ? Local    Randall Best 10/18/2019, 11:52 AM  _________________________________________________________________   (provider comments below)

## 2019-10-18 NOTE — Telephone Encounter (Signed)
Pt takes Xarelto for afib with CHADS2VASc score of 4 (age, CHF, TIA). Pt underwent cardioversion on 08/19/19, now outside 30 day uninterrupted anticoagulation period. Renal function is normal. With history of TIA, would prefer to keep anticoagulation hold as short as possible (1-2 days). Request is to hold Xarelto for 4-5 days prior and 1 day after eye surgery - this seems a bit excessive.  Will route to MD for input on anticoagulation hold - pt was just seen in office yesterday for follow up.

## 2019-10-18 NOTE — Telephone Encounter (Signed)
Pharmacy can you comment on holding Xarelto for this patient

## 2019-10-19 NOTE — Telephone Encounter (Signed)
I agree - does not make pharmacologic sense to hold > 2-3 days.    They should be OK with 3 days.    [name], MD

## 2019-10-19 NOTE — Telephone Encounter (Signed)
   Primary Cardiologist: [name], MD  Chart reviewed and patient contacted by phone today as part of pre-operative protocol coverage. Given past medical history and time since last visit, based on ACC/AHA guidelines, MITCHAL LAUTH would be at acceptable risk for the planned procedure without further cardiovascular testing.   OK to hold Xarelto 3 days pre op.  I have discussed this with the patient.   I will route this recommendation to the requesting party via Epic fax function and remove from pre-op pool.  Please call with questions.  Kerin Ransom, PA-C 10/19/2019, 9:34 AM

## 2019-10-20 ENCOUNTER — Encounter: Payer: Self-pay | Admitting: Cardiology

## 2019-10-20 DIAGNOSIS — I712 Thoracic aortic aneurysm, without rupture, unspecified: Secondary | ICD-10-CM | POA: Insufficient documentation

## 2019-10-20 HISTORY — DX: Thoracic aortic aneurysm, without rupture: I71.2

## 2019-10-20 HISTORY — DX: Thoracic aortic aneurysm, without rupture, unspecified: I71.20

## 2019-10-20 NOTE — Assessment & Plan Note (Signed)
Interesting, this is the first time he was seen in the about dilated aorta.  Need to monitor for signs of aortic insufficiency as well.  Would probably want to consider 34-month follow-up of CTA chest abdomen pelvis to get a full evaluation of the aorta.

## 2019-10-20 NOTE — Assessment & Plan Note (Signed)
Very symptomatic and difficult control.  Now on amiodarone for rhythm control because of significantly worsening EF.  Need to maintain sinus rhythm.  Hopefully he can maintain following cardioversion.  Monitor for signs of bradycardia as this would lead to referral for most likely BiV pacer possible ICD.  Is due to see Dr. Curt Bears on December 23, but with improved EF of 35 to 40%, I doubt that he would be considered a candidate for BiV ICD unless there is an indication for pacer.  Plan: Continue amiodarone and low-dose carvedilol. On Xarelto for anticoagulation with no bleeding issues.

## 2019-10-20 NOTE — Assessment & Plan Note (Signed)
His weight is up about 10 pounds from baseline.  I suspect that probably has somebody with recent holiday of Thanksgiving.  Somewhat indolent increase in weight.  I recommended he continue to make himself daily and adjust his Lasix accordingly as previously scheduled.  For now, we need to be a little aggressive with increasing his dosing.  TODAY - take an extra Spironloactone tablet  and lasix 80 mg  Tomorrow- take 120 mg lasix in the morning  And 80 mg  In the evening Wednesday  And Thursday - 80 mg  Twice a day Friday take regular dose of lasix Dry weight  Should be 205 lbs If weights is higher than 205 lbs - take 80 mg twice a day until   weight is 205 lbs then take 80 mg in the morning and 40 mg in the evening  each time you take an extra lasix take an extra potassium pill.

## 2019-10-20 NOTE — Assessment & Plan Note (Signed)
Clearly nonischemic.  Probably related to the combination of A. fib and left bundle branch block.  Thankfully his EF has improved, but this now leaves [place] in the limbo of 35 to 40% which likely 1 not make him a candidate for an ICD.  This is unfortunate, because BiV ICD would be a good option for him given his left bundle branch block.  Plan: He is on max tolerable dose of Entresto which is actually low-dose in the morning and intermediate dose at night along with low-dose carvedilol, digoxin in addition to his spironolactone and standing Lasix.

## 2019-11-02 ENCOUNTER — Other Ambulatory Visit: Payer: Self-pay

## 2019-11-02 ENCOUNTER — Ambulatory Visit (INDEPENDENT_AMBULATORY_CARE_PROVIDER_SITE_OTHER): Payer: BC Managed Care – PPO | Admitting: Cardiology

## 2019-11-02 ENCOUNTER — Encounter: Payer: Self-pay | Admitting: Cardiology

## 2019-11-02 VITALS — BP 102/68 | HR 55 | Ht 71.0 in | Wt 217.0 lb

## 2019-11-02 DIAGNOSIS — I4819 Other persistent atrial fibrillation: Secondary | ICD-10-CM | POA: Diagnosis not present

## 2019-11-02 DIAGNOSIS — Z79899 Other long term (current) drug therapy: Secondary | ICD-10-CM | POA: Diagnosis not present

## 2019-11-02 NOTE — Addendum Note (Signed)
Addended by: Eulis Foster on: 11/02/2019 09:37 AM   Modules accepted: Orders

## 2019-11-02 NOTE — Progress Notes (Signed)
Electrophysiology Office Note   Date:  11/02/2019   ID:  Randall Best, DOB 12-19-1950, MRN [id number]  PCP:  Camille Bal, PA-C  Cardiologist:  Ellyn Hack Primary Electrophysiologist:  Mysti Haley Meredith Leeds, MD    Chief Complaint:    History of Present Illness: Randall Best is a 68 y.o. male who is being seen today for the evaluation of AF, CHF at the request of Randall Best. Presenting today for electrophysiology evaluation.  He has a history of nonischemic cardiomyopathy, persistent atrial fibrillation, and a newly documented left bundle branch block.  He had a recent left heart catheterization that showed a well compensated cardiomyopathy with no evidence of coronary artery disease.  He was previously quite short of breath, but his Lasix and digoxin were increased which improved his symptoms.  He does not feel that he has been in atrial fibrillation, though he is persistent.  Today, denies symptoms of palpitations, chest pain, shortness of breath, orthopnea, PND, lower extremity edema, claudication, dizziness, presyncope, syncope, bleeding, or neurologic sequela. The patient is tolerating medications without difficulties.  Overall he is doing well.  He has no chest pain.  He does get short of breath when he exercises or climbs stairs, but otherwise has done well.  He remains in sinus rhythm as of his most recent ECG.  At this point, he would prefer to avoid pacemaker implant.   Past Medical History:  Diagnosis Date  . Asthma    as child  . Atrial fibrillation, rapid ([place]) 12/08/2017   Diagnosed during colonoscopy -presumably new onset  . GERD (gastroesophageal reflux disease)    use meds prn  . Glaucoma   . Headache    remote h/o migraines, none in years  . Hyperlipidemia   . NICM (nonischemic cardiomyopathy) ([place]) 04/2019   Echo 05/2019 - EF reduced to <20%.  Has permanent Afib & LBBB (normal Coronaries on Cath). -->  After cardioversion and titration medications, EF  up to 35-40% with  global hypokinesis..  . Thoracic aortic aneurysm ([place]) 10/20/2019   Ascending thoracic aortic estimated 44 mm on echo  . TIA (transient ischemic attack) 01/13/2018  . Ventral hernia    Past Surgical History:  Procedure Laterality Date  . CARDIAC EVENT MONITOR  12/2016   showed persistent Afib -monitor return in early because of persistent A. fib.  Several episodes of severe RVR with rates in the 170s were noted.   [name] CARDIOVERSION N/A 01/13/2018   Procedure: CARDIOVERSION;  Surgeon: Pixie Casino, MD;  Location: [facility] ENDOSCOPY;  Service: Cardiovascular;  Laterality: N/A; unsuccessful.-->  Post-cardioversion, the patient had left-sided facial numbness and weakness.  Ruled out for stroke.  [name] CARDIOVERSION N/A 08/19/2019   Procedure: CARDIOVERSION;  Surgeon: Larey Dresser, MD;  Location: [facility] ENDOSCOPY;  Service: Cardiovascular;  Laterality: N/A;  . INSERTION OF MESH N/A 07/22/2017   Procedure: INSERTION OF MESH;  Surgeon: [name], MD;  Location: [place];  Service: General;  Laterality: N/A;  BILATERAL TAP BLOCK  . NM MYOVIEW LTD  12/2017   Shows reduced EF, but no evidence of ischemia or infarction.  EF 40 and 45%.  INTERMEDIATE RISK due to reduced function.  EF notably better on echo (50 and 55%)  . RIGHT/LEFT HEART CATH AND CORONARY ANGIOGRAPHY N/A 07/14/2019   Procedure: RIGHT/LEFT HEART CATH AND CORONARY ANGIOGRAPHY;  Surgeon: [name], MD;  Location: [place] CV LAB;; Angiographically normal coronary arteries. RHC  - PCWP & LVEDP 17 mmHg.  CO-CI  4.92-2.34  . TEE WITHOUT CARDIOVERSION N/A 01/13/2018   Procedure: TRANSESOPHAGEAL ECHOCARDIOGRAM (TEE) with cardioversion - ;  Surgeon: Pixie Casino, MD;  Location: [facility] ENDOSCOPY;  Service: Cardiovascular;  Laterality: N/A;  Mild concentric LVH.  EF 55 to 60%.  No R WMA.  Mild ascending aortic dilation of 4.2 cm.  Dilated left atrium.  No thrombus.  . TONSILLECTOMY    . TRANSTHORACIC ECHOCARDIOGRAM  10/11/2019   EF  up to 35-40%.-Moderate-severely decreased function.  "GR 1 DD ".  Normal atrial sizes.  Moderate ascending aorta-44 mL.  Relatively normal valves.  . TRANSTHORACIC ECHOCARDIOGRAM  06/2019    Severely reduced function with diffuse hypokinesis and septal dyskinesis.  EF <20%.  Cannot exclude LV thrombus.  Reduced RV function with mildly elevated RV P--37 mmHg.  Moderate RA and mild LA dilation.  A centimeter dilation 42 mm  . VENTRAL HERNIA REPAIR N/A 07/22/2017   Procedure: OPEN VENTRAL HERNIA REPAIR WITH MESH ERAS PATHWAY;  Surgeon: [name], MD;  Location: [place];  Service: General;  Laterality: N/A;  BILATERAL TAP BLOCK     Current Outpatient Medications  Medication Sig Dispense Refill  . acetaminophen (TYLENOL) 325 MG tablet Take 650 mg by mouth every 6 (six) hours as needed.    [name] amiodarone (PACERONE) 200 MG tablet Take 1 tablet (200 mg total) by mouth daily. 90 tablet 3  . benzonatate (TESSALON PERLES) 100 MG capsule Take 1 capsule (100 mg total) by mouth every 6 (six) hours as needed for cough. 30 capsule 0  . carvedilol (COREG) 3.125 MG tablet Take 1 tablet (3.125 mg total) by mouth 2 (two) times daily. 180 tablet 3  . Cholecalciferol (VITAMIN D3) 5000 units TABS Take 5,000 Units by mouth daily.     . digoxin (LANOXIN) 0.125 MG tablet Take 1 tablet (0.125 mg total) by mouth daily. 90 tablet 3  . ENTRESTO 24-26 MG Take 2 tablets by mouth daily. Take one tablet in the morning.    . furosemide (LASIX) 40 MG tablet Take 2 tablets (80 mg total) by mouth 2 (two) times daily. 110 tablet 3  . potassium chloride SA (KLOR-CON M20) 20 MEQ tablet Take 20 mEq by mouth as needed. dehydration    . rivaroxaban (XARELTO) 20 MG TABS tablet TAKE 1 TABLET (20 MG TOTAL) BY MOUTH DAILY WITH SUPPER. 90 tablet 2  . sacubitril-valsartan (ENTRESTO) 49-51 MG Take 1 tablet by mouth daily. Take one tablet at night.    [name] Specialty Vitamins Products (ULTRA MAN PO) Take 1 tablet by mouth daily.     [name]  spironolactone (ALDACTONE) 25 MG tablet Take 1 tablet (25 mg total) by mouth daily. 90 tablet 3   No current facility-administered medications for this visit.    Allergies:   Ezetimibe, Omeprazole, and Statins   Social History:  The patient  reports that he has never smoked. He has never used smokeless tobacco. He reports current alcohol use. He reports that he does not use drugs.   Family History:  The patient's family history includes CVA in his maternal grandmother; Diabetes in his mother; Heart failure in his father; Hypertension in his brother.    ROS:  Please see the history of present illness.   Otherwise, review of systems is positive for none.   All other systems are reviewed and negative.   PHYSICAL EXAM: VS:  BP 102/68   Pulse (!) 55   Ht 5\' 11"  (1.803 m)   Wt 217 lb (98.4 kg)  SpO2 98%   BMI 30.27 kg/m  , BMI Body mass index is 30.27 kg/m. GEN: Well nourished, well developed, in no acute distress  HEENT: normal  Neck: no JVD, carotid bruits, or masses Cardiac: RRR; no murmurs, rubs, or gallops,no edema  Respiratory:  clear to auscultation bilaterally, normal work of breathing GI: soft, nontender, nondistended, + BS MS: no deformity or atrophy  Skin: warm and dry Neuro:  Strength and sensation are intact Psych: euthymic mood, full affect  EKG:  EKG is not ordered today. Personal review of the ekg ordered 10/17/19 shows him, left bundle branch block   Recent Labs: 08/02/2019: ALT 33 10/11/2019: BUN 20; Creatinine, Ser 1.19; Hemoglobin 13.1; Platelets 200; Potassium 4.0; Sodium 138; TSH 2.040    Lipid Panel     Component Value Date/Time   CHOL 153 01/14/2018 0631   TRIG 97 01/14/2018 0631   HDL 32 (L) 01/14/2018 0631   CHOLHDL 4.8 01/14/2018 0631   VLDL 19 01/14/2018 0631   LDLCALC 102 (H) 01/14/2018 0631     Wt Readings from Last 3 Encounters:  11/02/19 217 lb (98.4 kg)  10/17/19 215 lb (97.5 kg)  09/07/19 205 lb (93 kg)      Other studies  Reviewed: Additional studies/ records that were reviewed today include: TTE 10/11/19  Review of the above records today demonstrates:   1. Left ventricular ejection fraction, by visual estimation, is 35 to 40%. The left ventricle has moderate to severely decreased function. There is no left ventricular hypertrophy.  2. Left ventricular diastolic parameters are consistent with Grade I diastolic dysfunction (impaired relaxation).  3. Global right ventricle has normal systolic function.The right ventricular size is normal. No increase in right ventricular wall thickness.  4. Left atrial size was normal.  5. Right atrial size was normal.  6. The mitral valve is normal in structure. Mild mitral valve regurgitation. No evidence of mitral stenosis.  7. The tricuspid valve is normal in structure. Tricuspid valve regurgitation is trivial.  8. The aortic valve is tricuspid. Aortic valve regurgitation is not visualized. No evidence of aortic valve sclerosis or stenosis.  9. There is Mild thickening of the aortic valve. 10. The pulmonic valve was normal in structure. Pulmonic valve regurgitation is not visualized. 11. Aortic dilatation noted. 12. There is moderate dilatation of the ascending aorta measuring 44 mm. 13. The atrial septum is grossly normal. RHC/LHC 07/14/19  Angiographically normal coronary arteries  ---------------------  There is severe left ventricular systolic dysfunction. The left ventricular ejection fraction is less than 25% by visual estimate by visual estimation  Relatively compensated right heart cath pressures with PCWP and LVEDP of 17 mmHg.  Cardiac Output-Index (Fick) 4.92-2.34  ASSESSMENT AND PLAN:  1.  Persistent atrial fibrillation: Currently on Xarelto and carvedilol.  Had been in atrial fibrillation since 2018.  Has started amiodarone.  CHA2DS2-VASc of 4.  Fortunately he is remained in sinus rhythm.  No changes.  2.  Nonischemic cardiomyopathy: Left heart  catheterization without evidence of coronary artery disease.  Currently on carvedilol, Aldactone, digoxin.  Pressures not tolerated ACE inhibitors or ARB's.  His symptoms of shortness of breath greatly improved with maintenance of sinus rhythm.  His ejection fraction is also improved and thus he does not need an ICD.  He would prefer to hold off on pacemaker implant at this time.  No changes.  Current medicines are reviewed at length with the patient today.   The patient does not have concerns regarding his medicines.  The following changes were made today: None  Labs/ tests ordered today include:  Orders Placed This Encounter  Procedures  . Basic metabolic panel     Disposition:   FU with Bekki Tavenner 6 months  Signed, Glorianna Gott Meredith Leeds, MD  11/02/2019 9:35 AM     [facility] [address] [place] [address] [phone] (office) [phone] (fax)

## 2019-11-02 NOTE — Patient Instructions (Signed)
Medication Instructions:  Your physician recommends that you continue on your current medications as directed. Please refer to the Current Medication list given to you today.  * If you need a refill on your cardiac medications before your next appointment, please call your pharmacy.   Labwork: Today: BMET If you have labs (blood work) drawn today and your tests are completely normal, you will receive your results only by:  [place] (if you have MyChart) OR  A paper copy in the mail If you have any lab test that is abnormal or we need to change your treatment, we will call you to review the results.  Testing/Procedures: None ordered  Follow-Up: At [facility], you and your health needs are our priority.  As part of our continuing mission to provide you with exceptional heart care, we have created designated Provider Care Teams.  These Care Teams include your primary Cardiologist (physician) and Advanced Practice Providers (APPs -  Physician Assistants and Nurse Practitioners) who all work together to provide you with the care you need, when you need it.  You will need a follow up appointment in 6 months.  Please call our office 2 months in advance to schedule this appointment.  You may see Dr Curt Bears or one of the following Advanced Practice Providers on your designated Care Team:    Chanetta Marshall, NP  Tommye Standard, PA-C  Oda Kilts, [place]  Thank you for choosing [facility]!!   Trinidad Curet, RN [phone]

## 2019-11-03 LAB — BASIC METABOLIC PANEL
BUN/Creatinine Ratio: 22 (ref 10–24)
BUN: 30 mg/dL — ABNORMAL HIGH (ref 8–27)
CO2: 19 mmol/L — ABNORMAL LOW (ref 20–29)
Calcium: 9.7 mg/dL (ref 8.6–10.2)
Chloride: 96 mmol/L (ref 96–106)
Creatinine, Ser: 1.38 mg/dL — ABNORMAL HIGH (ref 0.76–1.27)
GFR calc Af Amer: 61 mL/min/{1.73_m2} (ref >59)
GFR calc non Af Amer: 53 mL/min/{1.73_m2} — ABNORMAL LOW (ref >59)
Glucose: 105 mg/dL — ABNORMAL HIGH (ref 65–99)
Potassium: 4.3 mmol/L (ref 3.5–5.2)
Sodium: 135 mmol/L (ref 134–144)

## 2019-11-18 ENCOUNTER — Telehealth: Payer: Self-pay | Admitting: Radiology

## 2019-11-18 ENCOUNTER — Other Ambulatory Visit: Payer: Self-pay

## 2019-11-18 ENCOUNTER — Ambulatory Visit (INDEPENDENT_AMBULATORY_CARE_PROVIDER_SITE_OTHER): Payer: 59 | Admitting: Cardiology

## 2019-11-18 ENCOUNTER — Encounter: Payer: Self-pay | Admitting: Cardiology

## 2019-11-18 VITALS — BP 111/71 | HR 44 | Ht 71.0 in | Wt 221.0 lb

## 2019-11-18 DIAGNOSIS — I5042 Chronic combined systolic (congestive) and diastolic (congestive) heart failure: Secondary | ICD-10-CM

## 2019-11-18 DIAGNOSIS — I4819 Other persistent atrial fibrillation: Secondary | ICD-10-CM

## 2019-11-18 DIAGNOSIS — I420 Dilated cardiomyopathy: Secondary | ICD-10-CM

## 2019-11-18 DIAGNOSIS — I712 Thoracic aortic aneurysm, without rupture, unspecified: Secondary | ICD-10-CM

## 2019-11-18 DIAGNOSIS — E782 Mixed hyperlipidemia: Secondary | ICD-10-CM

## 2019-11-18 DIAGNOSIS — I495 Sick sinus syndrome: Secondary | ICD-10-CM | POA: Insufficient documentation

## 2019-11-18 LAB — COMPREHENSIVE METABOLIC PANEL
ALT: 27 IU/L (ref 0–44)
AST: 22 IU/L (ref 0–40)
Albumin/Globulin Ratio: 1.8 (ref 1.2–2.2)
Albumin: 4.5 g/dL (ref 3.8–4.8)
Alkaline Phosphatase: 45 IU/L (ref 39–117)
BUN/Creatinine Ratio: 17 (ref 10–24)
BUN: 21 mg/dL (ref 8–27)
Bilirubin Total: 0.6 mg/dL (ref 0.0–1.2)
CO2: 23 mmol/L (ref 20–29)
Calcium: 9.2 mg/dL (ref 8.6–10.2)
Chloride: 102 mmol/L (ref 96–106)
Creatinine, Ser: 1.26 mg/dL (ref 0.76–1.27)
GFR calc Af Amer: 68 mL/min/{1.73_m2} (ref >59)
GFR calc non Af Amer: 59 mL/min/{1.73_m2} — ABNORMAL LOW (ref >59)
Globulin, Total: 2.5 g/dL (ref 1.5–4.5)
Glucose: 99 mg/dL (ref 65–99)
Potassium: 4.5 mmol/L (ref 3.5–5.2)
Sodium: 138 mmol/L (ref 134–144)
Total Protein: 7 g/dL (ref 6.0–8.5)

## 2019-11-18 LAB — LIPID PANEL
Chol/HDL Ratio: 6.4 ratio — ABNORMAL HIGH (ref 0.0–5.0)
Cholesterol, Total: 281 mg/dL — ABNORMAL HIGH (ref 100–199)
HDL: 44 mg/dL (ref >39)
LDL Chol Calc (NIH): 203 mg/dL — ABNORMAL HIGH (ref 0–99)
Triglycerides: 177 mg/dL — ABNORMAL HIGH (ref 0–149)
VLDL Cholesterol Cal: 34 mg/dL (ref 5–40)

## 2019-11-18 MED ORDER — METOLAZONE 2.5 MG PO TABS: 2.5000 mg | ORAL_TABLET | ORAL | 3 refills | Status: DC

## 2019-11-18 NOTE — Telephone Encounter (Signed)
Enrolled patient for a 7 day Zio monitor to be mailed to patients home.  

## 2019-11-18 NOTE — Patient Instructions (Addendum)
Medication Instructions:    Start taking Zaroxolyn ( metolazone) 2.5 mg  In the morning 30 min before furosemide ( lasix )  On Monday - Wednesday - fridays   ( take a dose tomorrow )   And on this Monday  Only  Take 120 mg lasix ( furosemide with zaroxolyn in the afternoon after surgery   *If you need a refill on your cardiac medications before your next appointment, please call your pharmacy*  Lab Work: Today -CMP , LIPID  If you have labs (blood work) drawn today and your tests are completely normal, you will receive your results only by: [name] MyChart Message (if you have MyChart) OR . A paper copy in the mail If you have any lab test that is abnormal or we need to change your treatment, we will call you to review the results.  Testing/Procedures: Your physician has recommended that you wear a 7 DAY ZIO-PATCH monitor. The Zio patch cardiac monitor continuously records heart rhythm data for up to 14 days, this is for patients being evaluated for multiple types heart rhythms. For the first 24 hours post application, please avoid getting the Zio monitor wet in the shower or by excessive sweating during exercise. After that, feel free to carry on with regular activities. Keep soaps and lotions away from the ZIO XT Patch.  This will be mailed to you, please expect 7-10 days to receive.   our [employer] location - [address].        Follow-Up: At [facility], you and your health needs are our priority.  As part of our continuing mission to provide you with exceptional heart care, we have created designated Provider Care Teams.  These Care Teams include your primary Cardiologist (physician) and Advanced Practice Providers (APPs -  Physician Assistants and Nurse Practitioners) who all work together to provide you with the care you need, when you need it.  Your next appointment:   3 month(s)  The format for your next appointment:   In Person  Provider:   [name],  MD  Other Instructions  OKAY TO HAVE  SURGERY ON Monday - okay to hold Xarelto as prescribed your  Eye doctor.   ZIO XT- Long Term Monitor Instructions   Your physician has requested you wear your ZIO patch monitor___7____days.   This is a single patch monitor.  [employer] supplies one patch monitor per enrollment.  Additional stickers are not available.   Please do not apply patch if you will be having a Nuclear Stress Test, Echocardiogram, Cardiac CT, MRI, or Chest Xray during the time frame you would be wearing the monitor. The patch cannot be worn during these tests.  You cannot remove and re-apply the ZIO XT patch monitor.   Your ZIO patch monitor will be sent USPS Priority mail from [facility] directly to your home address. The monitor may also be mailed to a PO BOX if home delivery is not available.   It may take 3-5 days to receive your monitor after you have been enrolled.   Once you have received you monitor, please review enclosed instructions.  Your monitor has already been registered assigning a specific monitor serial # to you.   Applying the monitor   Shave hair from upper left chest.   Hold abrader disc by orange tab.  Rub abrader in 40 strokes over left upper chest as indicated in your monitor instructions.   Clean area with 4 enclosed alcohol  pads .  Use all pads to assure are is cleaned thoroughly.  Let dry.   Apply patch as indicated in monitor instructions.  Patch will be place under collarbone on left side of chest with arrow pointing upward.   Rub patch adhesive wings for 2 minutes.Remove white label marked "1".  Remove white label marked "2".  Rub patch adhesive wings for 2 additional minutes.   While looking in a mirror, press and release button in center of patch.  A small green light will flash 3-4 times .  This will be your only indicator the monitor has been turned on.     Do not shower for the first 24 hours.  You may shower after the first 24  hours.   Press button if you feel a symptom. You will hear a small click.  Record Date, Time and Symptom in the Patient Log Book.   When you are ready to remove patch, follow instructions on last 2 pages of Patient Log Book.  Stick patch monitor onto last page of Patient Log Book.   Place Patient Log Book in [place] box.  Use locking tab on box and tape box closed securely.  The [employer] and [employer] has [employer] on it.  Please place in mailbox as soon as possible.  Your physician should have your test results approximately 7 days after the monitor has been mailed back to [facility].   Call [place] at [phone] if you have questions regarding your ZIO XT patch monitor.  Call them immediately if you see an orange light blinking on your monitor.   If your monitor falls off in less than 4 days contact our Monitor department at [phone].  If your monitor becomes loose or falls off after 4 days call [employer] at [phone] for suggestions on securing your monitor.

## 2019-11-18 NOTE — Progress Notes (Signed)
Primary Care Provider: Camille Bal, PA-C Cardiologist: [name], MD Electrophysiologist: Dr. Curt Bears  Clinic Note: Chief Complaint  Patient presents with  . Follow-up    1 month.  . Congestive Heart Failure    Nonischemic  . Atrial Fibrillation    HPI:    Randall Best is a 69 y.o. male with a complicated medical history with persistent A. fib and left bundle branch block leading to nonischemic cardiomyopathy.  He now presents for 1 month follow-up.  Most recent echo also showed dilated thoracic aorta 44 mm.  Cardiac History   Dx - Afib (Afib RVR in Jan 2019) ->failed TEE/DCCV (complicated by TIA Sx) . Planned Rate control with Diltiazem.  Telehealth visit 02/2019: noted episodes of "smothering spells when lying down", DOE & dry hacking cough. --> diltiazem restarted & started lasix.  June f/u ->still noting "smothering" &DOE. Rate still poorly controlled. -->2 D Echo ordered (but due to COVID delayed until Aug). -->Echo in Aug - EF <20%with global HK, EKG now showed LBBB with Afib RVR -> urgent f/u  9/3 - f/u - scheduled R&LHC followed by EP c/s; diltiazem d/c'd, started Carvedilol (not much BP) & Lasix increased. ? R&LHC - normal coronaries. PCWP & LVEDP 17 mmHg. CO-I:4.92-2.34-> Lasix 40 mg BID on Tue/Thurs with 40 mg on other days.;Started digoxin and spironolactone 12.5 mg daily  9/22: EP c/s - Dr. Curt Bears: Concern for Sx of Low Output HF -noted weakness, fatigue and exertional dyspnea but improved edema and orthopnea/PND.+ cough. Still asymptomatic of A. Fib. --> referred to [facility] (Dr.McLean) for NYHA Cl III HF ? Determined that we need to get him out of Afib if possible - but likely needs additional "tune-up". -- started on Amiodarone; Would be CRT-D candidate if EF still down on best tolerated meds   9/24: Adv HF Aundra Dubin) ->still noted dry cough, PND &orthopnea with DOE ~ mod exertion. Drinks lots of water. (pt concerned  about facility fees for CHF clinic) ? Continued Amiodarone - scheduled for fasciliated DCCV 10/9 (thought for ? Afib Ablation) & potential CRT-D.Thought processes by the EP still needed to restore sinus rhythm on the chance that restoring sinus rhythm will improve EF.  If NSR restored -->recheck Echo in ~3 months: if EF <35% -->refer for CRT-D,   If not able to DCCV, would likely proceed with crt-D & ? AVN ablation to allow BiV pacing. ? Continued Carvedilol, digoxin, spironolactone & started on Entesto.  (Blood pressures have not tolerated further titration of Entresto takes low-dose in the morning and intermediate dose in the evening) ? Home dry weight of 204-205 pounds.  Randall Best was last seen on October 17, 2019-he was doing fairly well but a little more of the "smothering sensation "happening over the last 2 days.  His weight was up but he was not having edema.  Some orthopnea and PND.  No chest pain or pressure.  Occasional flip-flop sensation. (Due for f/u EP visit with Dr. Curt Bears 12/23 -to discuss results of echo.).  At that time he was feeling great.  Blood pressure was borderline with some mild dizziness and position changes.  Occasional "rushing sensation in his heart. ? Was told to double up spironolactone on that day along with Lasix then take 120 mg Lasix the following morning.  Then 80 mg twice daily for the next 2 days.--Treatment for dry weight of 204-205 pounds. ? Scheduled to see Dr. Curt Bears on December 23.  He noted only getting  short of breath with exercising or climbing stairs.-->  They decided against pacemaker as long as he was maintaining sinus rhythm on amiodarone.  With EF of 35 to 40%, not considered ICD candidate.  Recent Hospitalizations: n/a  Reviewed  CV studies:    The following studies were reviewed today: (if available, images/films reviewed: From Epic Chart or Care Everywhere) . No new studies  Interval History:   Randall Best returns  here stating that he actually did better when he increased his dose of Lasix.  He is now taking 120 mg the morning and 80 mg in the evening of Lasix.  He still uses 4 pillows for orthopnea and does have intermittent smothering.  It is definitely better.  Of note, his weight has definitely gone up.  He weighed himself this morning is 214 pounds at home and thinks he is just simply gained some weight that is not all fluid related.  Edema is pretty well controlled.  He has no real chest pain or pressure with rest or exertion, but does get short of breath with doing exertion.  (Going up steps or or more than just walking around the house.  His energy level is relatively stable and he denies any significant irregular heartbeats or palpitations.   CV Review of Symptoms (Summary) positive for - dyspnea on exertion, orthopnea, paroxysmal nocturnal dyspnea and Described as a "smothering sensation ".  Also notes weight is up but no edema. negative for - chest pain, edema, irregular heartbeat, palpitations, rapid heart rate, shortness of breath or Syncope/near syncope, TIA/amaurosis fugax, claudication  The patient DOES all Not have symptoms concerning for COVID-19 infection (fever, chills, cough, or new shortness of breath).  The patient is practicing social distancing.  Still practicing masking, and is very careful when going out for necessary groceries and shopping.    REVIEWED OF SYSTEMS   A comprehensive ROS was performed. Review of Systems  Constitutional: Positive for malaise/fatigue ( he feels somewhat tired and fatigued.  Better than last visit). Negative for weight loss (Weight gain).  HENT: Negative for congestion and nosebleeds.   Respiratory: Positive for shortness of breath (With exertion). Negative for cough and wheezing.   Cardiovascular: Positive for orthopnea (4 pillows). Negative for leg swelling.  Gastrointestinal: Negative for blood in stool, heartburn and melena.  Genitourinary:  Negative for hematuria.  Musculoskeletal: Negative for falls and joint pain.  Neurological: Negative for dizziness, focal weakness and headaches.  Endo/Heme/Allergies: Negative for environmental allergies. Does not bruise/bleed easily.  Psychiatric/Behavioral: Negative for depression and memory loss. The patient is nervous/anxious (Started get anxious again about the smothering). The patient does not have insomnia.    I have reviewed and (if needed) personally updated the patient's problem list, medications, allergies, past medical and surgical history, social and family history.   PAST MEDICAL HISTORY   Past Medical History:  Diagnosis Date  . Asthma    as child  . Atrial fibrillation, rapid ([place]) 12/08/2017   Diagnosed during colonoscopy -presumably new onset  . GERD (gastroesophageal reflux disease)    use meds prn  . Glaucoma   . Headache    remote h/o migraines, none in years  . Hyperlipidemia   . NICM (nonischemic cardiomyopathy) ([facility]) 04/2019   Echo 05/2019 - EF reduced to <20%.  Has permanent Afib & LBBB (normal Coronaries on Cath). -->  After cardioversion and titration medications, EF up to 35-40% with  global hypokinesis..  . Thoracic aortic aneurysm ([place]) 10/20/2019   Ascending  thoracic aortic estimated 44 mm on echo  . TIA (transient ischemic attack) 01/13/2018  . Ventral hernia      PAST SURGICAL HISTORY   Past Surgical History:  Procedure Laterality Date  . CARDIAC EVENT MONITOR  12/2016   showed persistent Afib -monitor return in early because of persistent A. fib.  Several episodes of severe RVR with rates in the 170s were noted.   [name] CARDIOVERSION N/A 01/13/2018   Procedure: CARDIOVERSION;  Surgeon: Pixie Casino, MD;  Location: [facility] ENDOSCOPY;  Service: Cardiovascular;  Laterality: N/A; unsuccessful.-->  Post-cardioversion, the patient had left-sided facial numbness and weakness.  Ruled out for stroke.  [name] CARDIOVERSION N/A 08/19/2019   Procedure: CARDIOVERSION;   Surgeon: Larey Dresser, MD;  Location: [facility] ENDOSCOPY;  Service: Cardiovascular;  Laterality: N/A;  . INSERTION OF MESH N/A 07/22/2017   Procedure: INSERTION OF MESH;  Surgeon: [name], MD;  Location: [place];  Service: General;  Laterality: N/A;  BILATERAL TAP BLOCK  . NM MYOVIEW LTD  12/2017   Shows reduced EF, but no evidence of ischemia or infarction.  EF 40 and 45%.  INTERMEDIATE RISK due to reduced function.  EF notably better on echo (50 and 55%)  . RIGHT/LEFT HEART CATH AND CORONARY ANGIOGRAPHY N/A 07/14/2019   Procedure: RIGHT/LEFT HEART CATH AND CORONARY ANGIOGRAPHY;  Surgeon: [name], MD;  Location: [place] CV LAB;; Angiographically normal coronary arteries. RHC  - PCWP & LVEDP 17 mmHg.  CO-CI 4.92-2.34  . TEE WITHOUT CARDIOVERSION N/A 01/13/2018   Procedure: TRANSESOPHAGEAL ECHOCARDIOGRAM (TEE) with cardioversion - ;  Surgeon: Pixie Casino, MD;  Location: [facility] ENDOSCOPY;  Service: Cardiovascular;  Laterality: N/A;  Mild concentric LVH.  EF 55 to 60%.  No R WMA.  Mild ascending aortic dilation of 4.2 cm.  Dilated left atrium.  No thrombus.  . TONSILLECTOMY    . TRANSTHORACIC ECHOCARDIOGRAM  10/11/2019   EF up to 35-40%.-Moderate-severely decreased function.  "GR 1 DD ".  Normal atrial sizes.  Moderate ascending aorta-44 mL.  Relatively normal valves.  . TRANSTHORACIC ECHOCARDIOGRAM  06/2019    Severely reduced function with diffuse hypokinesis and septal dyskinesis.  EF <20%.  Cannot exclude LV thrombus.  Reduced RV function with mildly elevated RV P--37 mmHg.  Moderate RA and mild LA dilation.  A centimeter dilation 42 mm  . VENTRAL HERNIA REPAIR N/A 07/22/2017   Procedure: OPEN VENTRAL HERNIA REPAIR WITH MESH ERAS PATHWAY;  Surgeon: [name], MD;  Location: [place];  Service: General;  Laterality: N/A;  BILATERAL TAP BLOCK     MEDICATIONS/ALLERGIES   Current Meds  Medication Sig  . acetaminophen (TYLENOL) 325 MG tablet Take 650 mg by mouth every 6 (six)  hours as needed.  [name] amiodarone (PACERONE) 200 MG tablet Take 1 tablet (200 mg total) by mouth daily.  . benzonatate (TESSALON PERLES) 100 MG capsule Take 1 capsule (100 mg total) by mouth every 6 (six) hours as needed for cough.  . Cholecalciferol (VITAMIN D3) 5000 units TABS Take 5,000 Units by mouth daily.   . digoxin (LANOXIN) 0.125 MG tablet Take 1 tablet (0.125 mg total) by mouth daily.  [name] ENTRESTO 24-26 MG Take 2 tablets by mouth daily. Take one tablet in the morning.  . potassium chloride SA (KLOR-CON M20) 20 MEQ tablet Take 20 mEq by mouth as needed. dehydration  . rivaroxaban (XARELTO) 20 MG TABS tablet TAKE 1 TABLET (20 MG TOTAL) BY MOUTH DAILY WITH SUPPER.  . sacubitril-valsartan (ENTRESTO) 49-51  MG Take 1 tablet by mouth daily. Take one tablet at night.  [name] Specialty Vitamins Products (ULTRA MAN PO) Take 1 tablet by mouth daily.     Allergies  Allergen Reactions  . Ezetimibe Other (See Comments)    Broke out in sores/ muscle pain  . Omeprazole Nausea And Vomiting  . Statins Other (See Comments)    Muscle pains, mouth sores     SOCIAL HISTORY/FAMILY HISTORY   Social History   Tobacco Use  . Smoking status: Never Smoker  . Smokeless tobacco: Never Used  Substance Use Topics  . Alcohol use: Yes    Comment: Rarely  . Drug use: No   Social History   Social History Narrative   He is a married father of 77, grandfather of 1.  Lives with his wife.   He does exercise regularly most days of the week for about 30 minutes.  He walks about 1-3 miles a day.  He will occasionally also walk on the treadmill.  He notes his energy is better if he remains active.    Family History family history includes CVA in his maternal grandmother; Diabetes in his mother; Heart failure in his father; Hypertension in his brother.   OBJCTIVE -PE, EKG, labs   Wt Readings from Last 3 Encounters:  11/18/19 221 lb (100.2 kg)  11/02/19 217 lb (98.4 kg)  10/17/19 215 lb (97.5 kg)    Physical  Exam: BP 111/71   Pulse (!) 44   Ht 5\' 11"  (1.803 m)   Wt 221 lb (100.2 kg)   SpO2 100%   BMI 30.82 kg/m  Physical Exam  Constitutional: He is oriented to person, place, and time. He appears well-developed and well-nourished. No distress.  Healthy-appearing.  Well-groomed.  HENT:  Head: Normocephalic and atraumatic.  Neck: Hepatojugular reflux (May be up to 8 cm H2O) and JVD (~8 cm H20) present. Carotid bruit is not present. No thyromegaly present.  Cardiovascular: Regular rhythm, S1 normal and intact distal pulses.  No extrasystoles are present. Bradycardia present. PMI is not displaced (May be slightly lateral but not sustained). Exam reveals gallop, S4 and distant heart sounds. Exam reveals no friction rub.  No murmur heard. Likely split S2.  Difficult to tell dysplastic versus gallop.  Pulmonary/Chest: Effort normal and breath sounds normal. No respiratory distress. He has no wheezes. He has no rales.  Abdominal: Soft. Bowel sounds are normal. He exhibits no distension. There is no abdominal tenderness. There is no rebound.  No obvious HSM  Musculoskeletal:        General: No edema (Trivial). Normal range of motion.     Cervical back: Normal range of motion and neck supple.  Neurological: He is alert and oriented to person, place, and time.  Psychiatric: He has a normal mood and affect. His behavior is normal. Judgment and thought content normal.  Vitals reviewed.    Adult ECG Report  Rate: 77;  Rhythm: sinus bradycardia and LBBB-wide; left axis deviation;   Narrative Interpretation: Stable EKG  Recent Labs: Ordered today. Lab Results  Component Value Date   CHOL 281 (H) 11/18/2019   HDL 44 11/18/2019   LDLCALC 203 (H) 11/18/2019   TRIG 177 (H) 11/18/2019   CHOLHDL 6.4 (H) 11/18/2019   Lab Results  Component Value Date   CREATININE 1.26 11/18/2019   BUN 21 11/18/2019   NA 138 11/18/2019   K 4.5 11/18/2019   CL 102 11/18/2019   CO2 23 11/18/2019  ASSESSMENT/PLAN    Problem List Items Addressed This Visit    Persistent atrial fibrillation [facility]): CHA2DS2-VASc Score 4 (CHF, HTN, Age 21, Aortic Plaque) (Chronic)    Maintaining sinus rhythm now on amiodarone, but he has sinus bradycardia.  I am concerned with the low-dose carvedilol and amiodarone that he may actually have chronotropic incompetence contributing to his exertional dyspnea....  Plan: 1-week Zio patch monitor. Continue Xarelto. For now we will continue amiodarone at current dose, but consider reducing to 100 mg daily      Relevant Medications   metolazone (ZAROXOLYN) 2.5 MG tablet   Other Relevant Orders   ZIO PATCH---LONG TERM MONITOR (3-14 DAYS)   EKG 12-Lead   Nonischemic dilated cardiomyopathy (HCC) (Chronic)    Nonischemic.  I suspect this is related to A. fib and left bundle branch block.  Unfortunately, with restoration of sinus rhythm his EF did not improve fully.  It is 35 to 40%.  It is no longer 25% which is good, but it puts him at a level where he does not meet criteria for BiV ICD/CRT-D.  He is on Entresto, carvedilol at max tolerated dose along with spironolactone and standing dose of Lasix.  Adding intermittent dose of Zaroxolyn.      Relevant Medications   metolazone (ZAROXOLYN) 2.5 MG tablet   Other Relevant Orders   Lipid panel (Completed)   Comprehensive metabolic panel (Completed)   Chronic combined systolic and diastolic heart failure (HCC) - Primary (Chronic)    Overall seems to be doing relatively stable but he still has 4 pillow orthopnea and exertional dyspnea.  NYHA class II-III at baseline.  I truly believe he would benefit from BiV pacemaker if not ICD but with a EF of 35 to 40% does not meet criteria for ICD.  I think resynchronization therapy would be beneficial.  He is on Entresto as much as tolerated with orthostatic dizziness.  Maybe L eventually titrated up to twice daily dosing of the 49-51 mg dose. He is on low-dose  carvedilol which we can increase because of bradycardia. He is on spironolactone now along with pretty high-dose Lasix. --> With continued increased weight from baseline (now trended shoot for maybe 210 pounds, unknown add Zaroxolyn 2.5 mg daily on Monday Wednesday and Friday. He will need lab checked today for baseline renal function.      Relevant Medications   metolazone (ZAROXOLYN) 2.5 MG tablet   Other Relevant Orders   Lipid panel (Completed)   Comprehensive metabolic panel (Completed)   Thoracic aortic aneurysm (HCC) (Chronic)    Will order CTA of chest to reassess in roughly June timeframe.      Relevant Medications   metolazone (ZAROXOLYN) 2.5 MG tablet   Hyperlipidemia (Chronic)   Relevant Medications   metolazone (ZAROXOLYN) 2.5 MG tablet   Other Relevant Orders   Lipid panel (Completed)   Tachycardia-bradycardia syndrome ([place])    Clearly had symptomatic either rate controlled or rapid A. fib when in A. fib.  Is now on very low-dose beta-blocker and maintenance dose amiodarone to maintain sinus rhythm to improve cardiac output.  Unfortunately, he now has bradycardia on his EKG at 44 bpm.  With him having fatigue and exertional dyspnea, I will to make sure that he does not have any issues with chronotropic incompetence.  We will order 7days Zio patch monitor to assess heart rate responsiveness.      Relevant Medications   metolazone (ZAROXOLYN) 2.5 MG tablet   Other Relevant Orders  ZIO PATCH---LONG TERM MONITOR (3-14 DAYS)     Notable difference today is bradycardic at 44 bpm and is resting dry weight is up.  Usually his dry weight was 204 to 205 pounds in the morning, but now he is out about 214 at home.  We will reset his dry weight at home to 210.  Plan: Check chemistry panel and lipids today, 1 week Zio patch, add Zaroxolyn 2.5 mg every morning on Monday Wednesday Friday and continue Lasix 120 mg AM, 80 mg p.m.  ; okay to hold DOAC for upcoming eye surgery. We  will need to check CTA aorta in 6 months.  ---------------------- COVID-19 Education: The signs and symptoms of COVID-19 were discussed with the patient and how to seek care for testing (follow up with PCP or arrange E-visit).   The importance of social distancing was discussed today.  I spent a total of 26 minutes with the patient and chart review. >  50% of the time was spent in direct patient consultation.  Additional time spent with chart review (studies, outside notes, etc): 10 Total Time: 36 min   Current medicines are reviewed at length with the patient today.  (+/- concerns) n/a   Patient Instructions / Medication Changes & Studies & Tests Ordered   Patient Instructions  Medication Instructions:    Start taking Zaroxolyn ( metolazone) 2.5 mg  In the morning 30 min before furosemide ( lasix )  On Monday - Wednesday - fridays   ( take a dose tomorrow )   And on this Monday  Only  Take 120 mg lasix ( furosemide with zaroxolyn in the afternoon after surgery   *If you need a refill on your cardiac medications before your next appointment, please call your pharmacy*  Lab Work: Today -CMP , LIPID  If you have labs (blood work) drawn today and your tests are completely normal, you will receive your results only by: [name] MyChart Message (if you have MyChart) OR . A paper copy in the mail If you have any lab test that is abnormal or we need to change your treatment, we will call you to review the results.  Testing/Procedures: Your physician has recommended that you wear a 7 DAY ZIO-PATCH monitor. The Zio patch cardiac monitor continuously records heart rhythm data for up to 14 days, this is for patients being evaluated for multiple types heart rhythms. For the first 24 hours post application, please avoid getting the Zio monitor wet in the shower or by excessive sweating during exercise. After that, feel free to carry on with regular activities. Keep soaps and lotions away from the ZIO XT  Patch.  This will be mailed to you, please expect 7-10 days to receive.   our [employer] location - [address].        Follow-Up: At [facility], you and your health needs are our priority.  As part of our continuing mission to provide you with exceptional heart care, we have created designated Provider Care Teams.  These Care Teams include your primary Cardiologist (physician) and Advanced Practice Providers (APPs -  Physician Assistants and Nurse Practitioners) who all work together to provide you with the care you need, when you need it.  Your next appointment:   3 month(s)  The format for your next appointment:   In Person  Provider:   [name], MD  Other Instructions  OKAY TO HAVE  SURGERY ON Monday - okay to hold Xarelto  as prescribed your  Eye doctor.   ZIO XT- Long Term Monitor Instructions   Your physician has requested you wear your ZIO patch monitor___7____days.   Studies Ordered:   Orders Placed This Encounter  Procedures  . Lipid panel  . Comprehensive metabolic panel  . ZIO PATCH---LONG TERM MONITOR (3-14 DAYS)  . EKG 12-Lead     [name], M.D., M.S. Interventional Cardiologist   Pager # [phone] Phone # [phone] [address] [place], [place] [address]   Thank you for choosing Heartcare at [facility]!!

## 2019-11-20 ENCOUNTER — Encounter: Payer: Self-pay | Admitting: Cardiology

## 2019-11-20 NOTE — Assessment & Plan Note (Signed)
Nonischemic.  I suspect this is related to A. fib and left bundle branch block.  Unfortunately, with restoration of sinus rhythm his EF did not improve fully.  It is 35 to 40%.  It is no longer 25% which is good, but it puts him at a level where he does not meet criteria for BiV ICD/CRT-D.  He is on Entresto, carvedilol at max tolerated dose along with spironolactone and standing dose of Lasix.  Adding intermittent dose of Zaroxolyn.

## 2019-11-20 NOTE — Assessment & Plan Note (Signed)
Overall seems to be doing relatively stable but he still has 4 pillow orthopnea and exertional dyspnea.  NYHA class II-III at baseline.  I truly believe he would benefit from BiV pacemaker if not ICD but with a EF of 35 to 40% does not meet criteria for ICD.  I think resynchronization therapy would be beneficial.  He is on Entresto as much as tolerated with orthostatic dizziness.  Maybe L eventually titrated up to twice daily dosing of the 49-51 mg dose. He is on low-dose carvedilol which we can increase because of bradycardia. He is on spironolactone now along with pretty high-dose Lasix. --> With continued increased weight from baseline (now trended shoot for maybe 210 pounds, unknown add Zaroxolyn 2.5 mg daily on Monday Wednesday and Friday. He will need lab checked today for baseline renal function.

## 2019-11-20 NOTE — Assessment & Plan Note (Addendum)
Maintaining sinus rhythm now on amiodarone, but he has sinus bradycardia.  I am concerned with the low-dose carvedilol and amiodarone that he may actually have chronotropic incompetence contributing to his exertional dyspnea....  Plan: 1-week Zio patch monitor. Continue Xarelto. For now we will continue amiodarone at current dose, but consider reducing to 100 mg daily

## 2019-11-20 NOTE — Assessment & Plan Note (Signed)
Will order CTA of chest to reassess in roughly June timeframe.

## 2019-11-20 NOTE — Assessment & Plan Note (Addendum)
Clearly had symptomatic either rate controlled or rapid A. fib when in A. fib.  Is now on very low-dose beta-blocker and maintenance dose amiodarone to maintain sinus rhythm to improve cardiac output.  Unfortunately, he now has bradycardia on his EKG at 44 bpm.  With him having fatigue and exertional dyspnea, I will to make sure that he does not have any issues with chronotropic incompetence.  We will order 7days Zio patch monitor to assess heart rate responsiveness.

## 2019-11-22 ENCOUNTER — Other Ambulatory Visit (INDEPENDENT_AMBULATORY_CARE_PROVIDER_SITE_OTHER): Payer: 59

## 2019-11-22 DIAGNOSIS — I4819 Other persistent atrial fibrillation: Secondary | ICD-10-CM | POA: Diagnosis not present

## 2019-11-22 DIAGNOSIS — I495 Sick sinus syndrome: Secondary | ICD-10-CM

## 2019-11-25 ENCOUNTER — Encounter: Payer: Self-pay | Admitting: *Deleted

## 2019-11-25 ENCOUNTER — Telehealth: Payer: Self-pay | Admitting: *Deleted

## 2019-11-25 MED ORDER — ROSUVASTATIN CALCIUM 5 MG PO TABS: 5.0000 mg | ORAL_TABLET | Freq: Every day | ORAL | 6 refills | Status: DC

## 2019-11-25 NOTE — Telephone Encounter (Signed)
The patient has been notified of the result and verbalized understanding.  All questions (if any) were answered.  Aware to  Gradually increase does of rosuvastatin as patient can tolerated . Mailed results. Raiford Simmonds, RN 11/25/2019 4:59 PM

## 2019-11-25 NOTE — Telephone Encounter (Signed)
-----   Message from [name], MD sent at 11/20/2019  3:46 PM EST ----- Cholesterol panel looks pretty bad.  Total cholesterol is up to 281 with triglycerides 177 and LDL 203.  I think we probably need to think about treating this.  He has had intolerance of statins in the past  Lets try 5 mg rosuvastatin 2 days a week to see how well he does.  If tolerated, increase every 2 weeks to an additional day each week.   Can discuss in follow-up.  Chemistry panel shows improved kidney function.  Stable electrolytes and normal liver function.  [name], MD

## 2019-12-05 ENCOUNTER — Other Ambulatory Visit: Payer: Self-pay | Admitting: Cardiology

## 2019-12-08 ENCOUNTER — Other Ambulatory Visit: Payer: Self-pay | Admitting: Cardiology

## 2019-12-13 ENCOUNTER — Other Ambulatory Visit: Payer: Self-pay | Admitting: Cardiology

## 2019-12-13 DIAGNOSIS — I495 Sick sinus syndrome: Secondary | ICD-10-CM

## 2019-12-13 DIAGNOSIS — I4819 Other persistent atrial fibrillation: Secondary | ICD-10-CM

## 2019-12-20 ENCOUNTER — Other Ambulatory Visit: Payer: Self-pay | Admitting: Cardiology

## 2019-12-20 NOTE — Telephone Encounter (Signed)
Rx has been sent to the pharmacy electronically. ° °

## 2019-12-29 IMAGING — MR MR HEAD W/O CM
9 of 11 series · 33 of 48 positions shown · non-contrast
Comparison: Head CT and CTA from yesterday

CLINICAL DATA: TIA, initial exam.

EXAM:
MRI HEAD WITHOUT CONTRAST
TECHNIQUE: Multiplanar, multiecho pulse sequences of the brain and surrounding
structures were obtained without intravenous contrast.

[Series 3: DWI · axial · 3.0mm · 0.94mm/px · z∈[-42,+100]mm · 7 of 100 slices shown (1 of 2)]
[im 1/100]
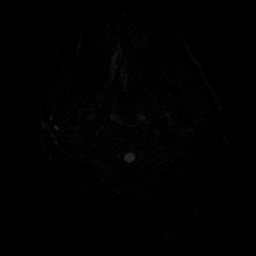
[im 17/100]
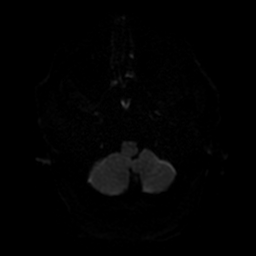
[im 34/100]
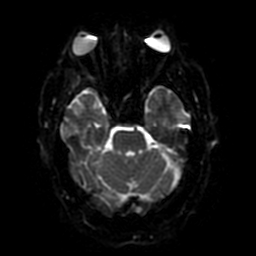
[im 50/100]
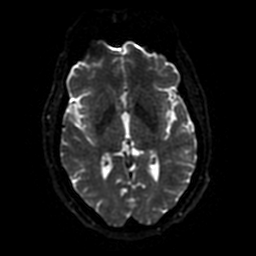
[im 67/100]
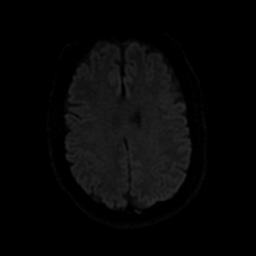
[im 83/100]
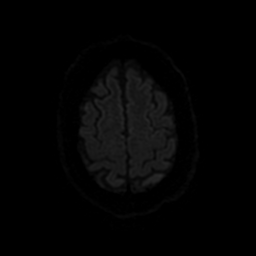
[im 100/100]
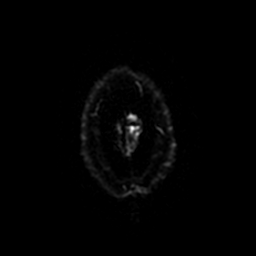

[Series 4: DWI · coronal · 4.0mm · 0.94mm/px · 6 of 72 slices shown (2 of 2)]
[im 1/72]
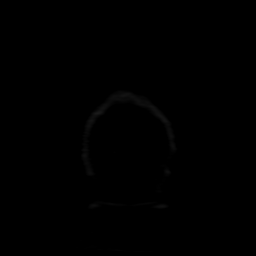
[im 15/72]
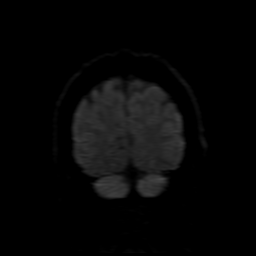
[im 29/72]
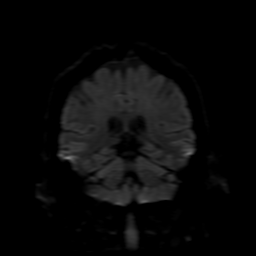
[im 43/72]
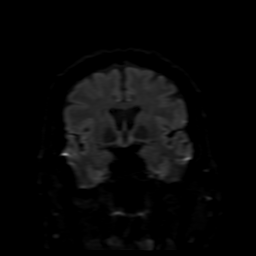
[im 57/72]
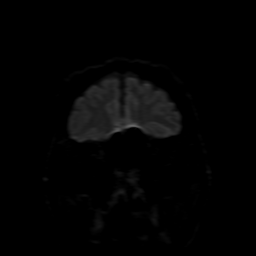
[im 72/72]
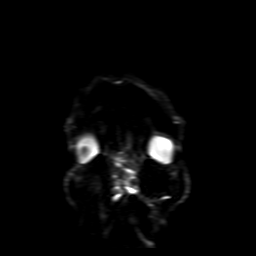

[Series 6: T2 · axial · 5.0mm · 0.47mm/px · z∈[-64,+90]mm · 2 of 27 slices shown (1 of 2)]
[im 1/27]
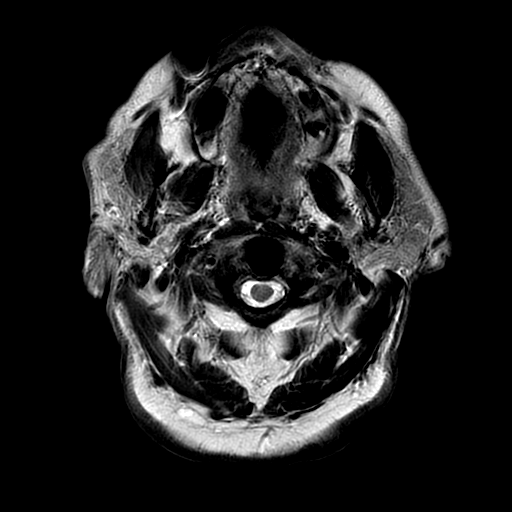
[im 27/27]
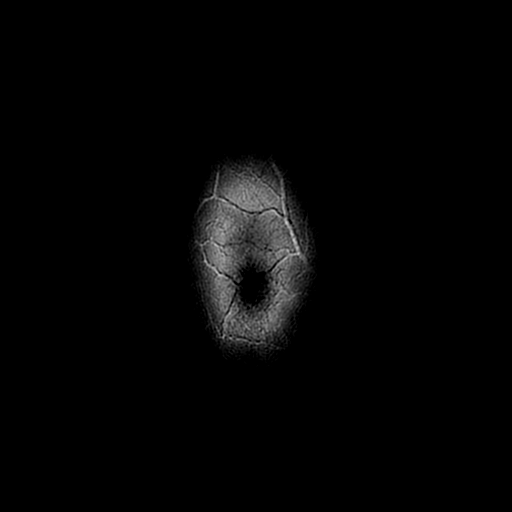

[Series 7: FLAIR · axial · 5.0mm · 0.47mm/px · z∈[-64,+90]mm · 2 of 27 slices shown (1 of 2)]
[im 1/27]
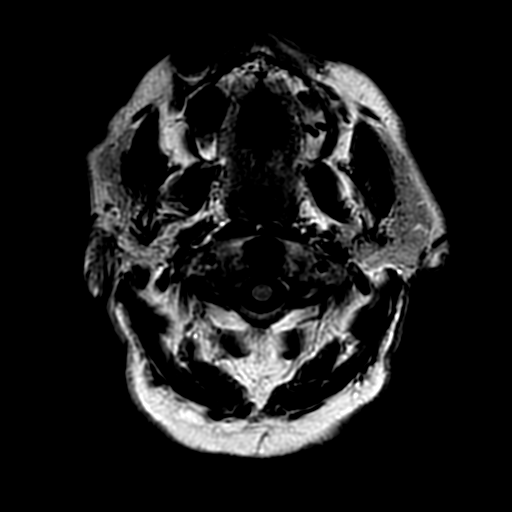
[im 27/27]
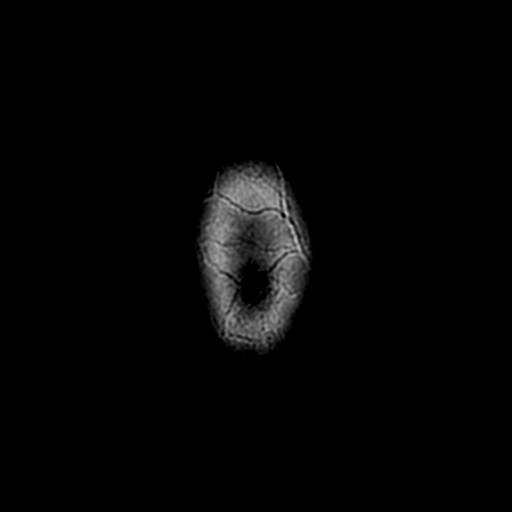

[Series 8: (person_name) · axial · 3.0mm · 0.47mm/px · z∈[-51,+33]mm · 5 of 100 slices shown]
[im 1/100]
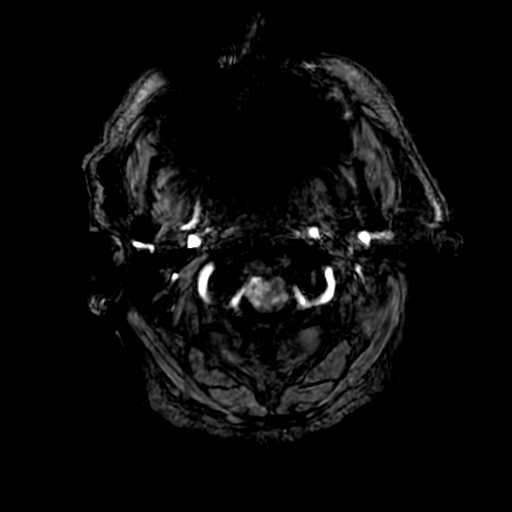
[im 15/100]
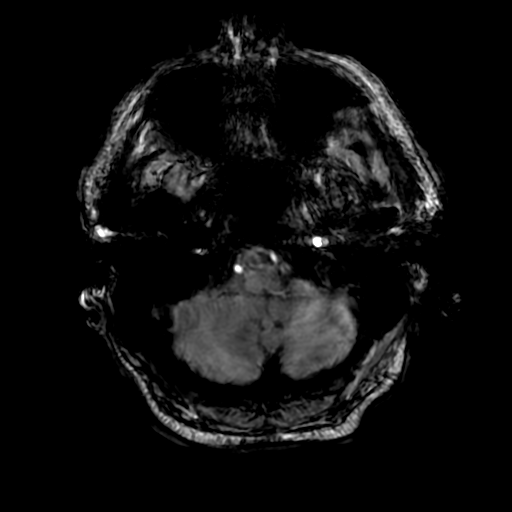
[im 29/100]
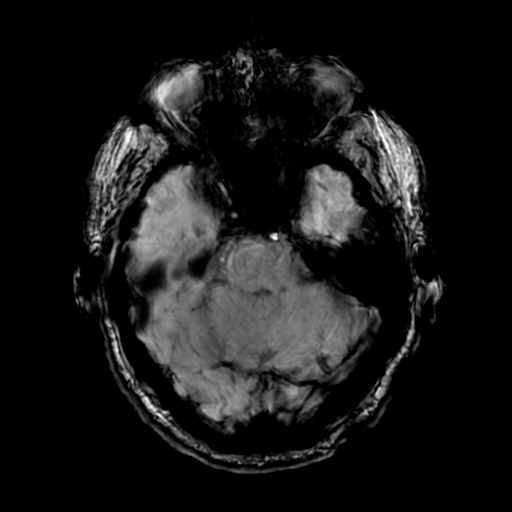
[im 43/100]
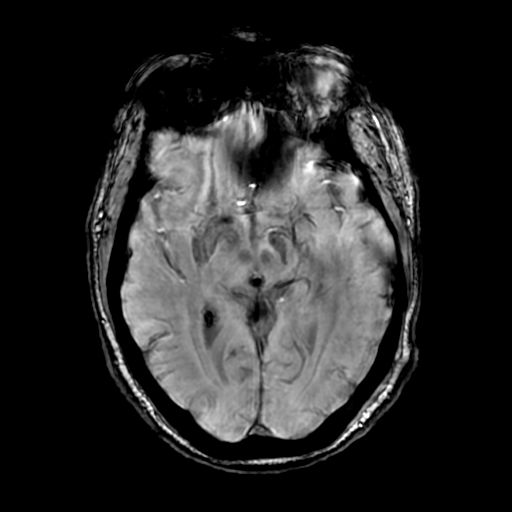
[im 57/100]
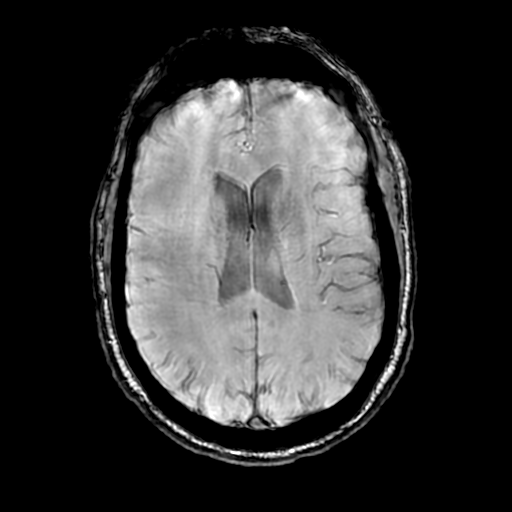

[Series 10: T2 · coronal · 5.0mm · 0.47mm/px · 2 of 29 slices shown (2 of 2)]
[im 1/29]
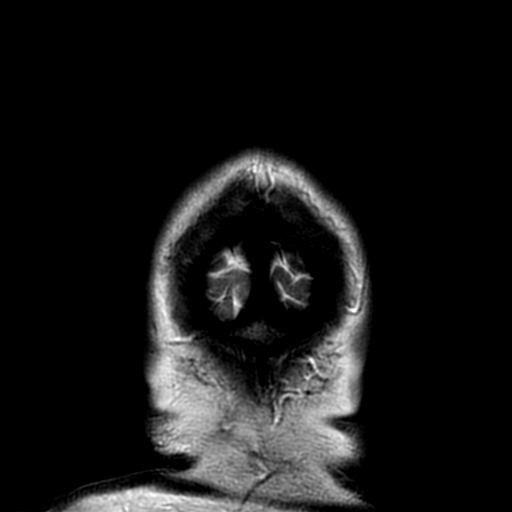
[im 29/29]
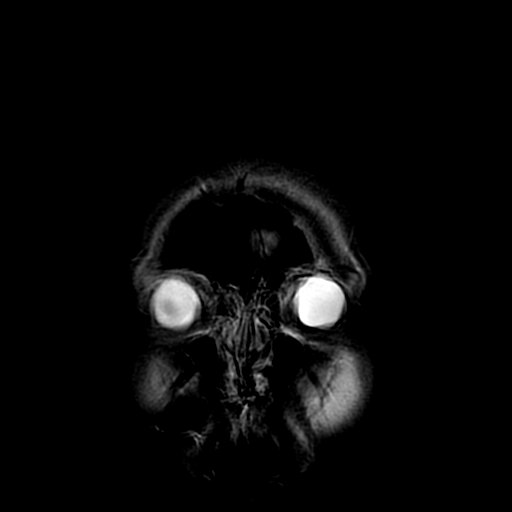

[Series 11: FLAIR · sagittal · 5.0mm · 0.47mm/px · 2 of 23 slices shown (2 of 2)]
[im 1/23]
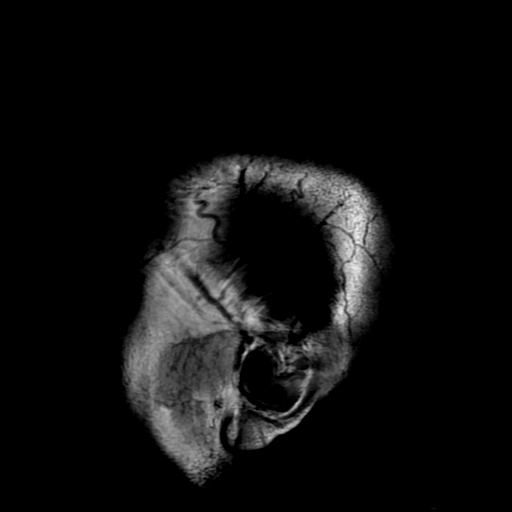
[im 23/23]
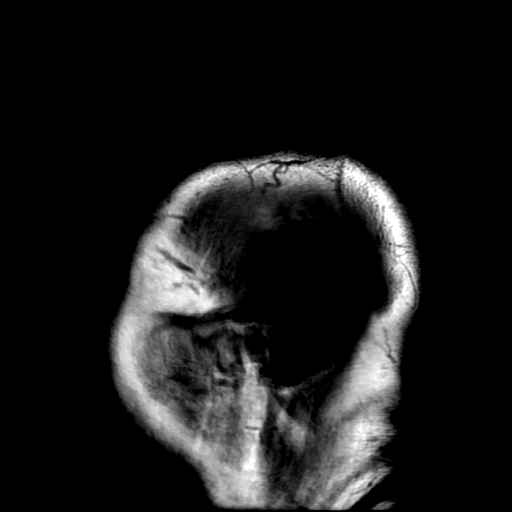

[Series 350: ADC · axial · 3.0mm · 0.94mm/px · z∈[-42,+100]mm · 4 of 48 slices shown (1 of 2)]
[im 1/48]
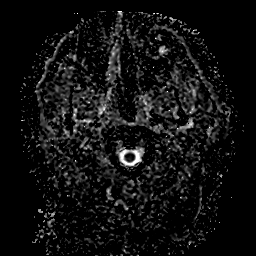
[im 16/48]
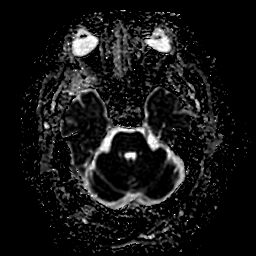
[im 32/48]
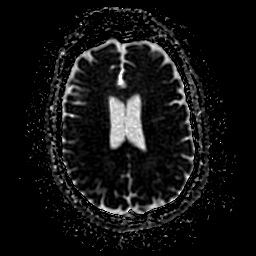
[im 48/48]
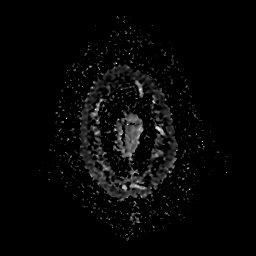

[Series 450: ADC · coronal · 4.0mm · 0.94mm/px · 3 of 36 slices shown (2 of 2)]
[im 1/36]
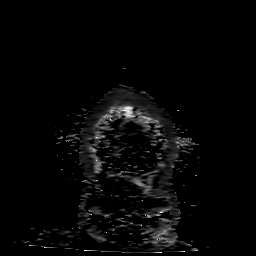
[im 18/36]
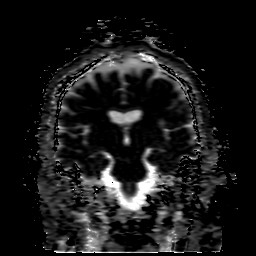
[im 36/36]
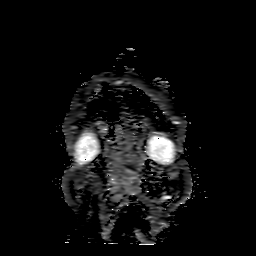

[33 of 48 positions shown; findings below may reference images not displayed]

FINDINGS: Brain: No acute infarction, hemorrhage, hydrocephalus, extra-axial
collection or mass lesion. Three or 4 small FLAIR hyperintensities
in the cerebral white, allowable for age. Normal brain volume.

Vascular: Major flow voids are preserved.

Skull and upper cervical spine: No evidence of marrow lesion.

Sinuses/Orbits: Negative

Other: Progressively motion degraded exam.
IMPRESSION: Unremarkable exam for age.  Negative for infarct.

## 2020-02-07 ENCOUNTER — Telehealth: Payer: Self-pay | Admitting: Cardiology

## 2020-02-07 DIAGNOSIS — K921 Melena: Secondary | ICD-10-CM

## 2020-02-07 DIAGNOSIS — Z79899 Other long term (current) drug therapy: Secondary | ICD-10-CM

## 2020-02-07 NOTE — Telephone Encounter (Signed)
New Message:     Pt says he have been passing blood in his stool for the last 2 weeks. He is very concerned,because he is on Xarelto.

## 2020-02-07 NOTE — Telephone Encounter (Signed)
Spoke with pt who report dark stool with a mixture of bright red blood that's noticeably only when he wipe x two weeks. Pt state he is asymptomatic but experience occasional lightheadedness after bowel movement that resolves quickly.    Nurse spoke with anticoag department who report pt has a Hx of TIA and unable to stop xarelto. Pharmacist  recommend CBC & BMP to assess further and for pt to contact PCP as well. Will place order under Dr. Loletha Grayer (DOD) due to primary cardiologist on vacation.

## 2020-02-08 LAB — BASIC METABOLIC PANEL
BUN/Creatinine Ratio: 16 (ref 10–24)
BUN: 24 mg/dL (ref 8–27)
CO2: 25 mmol/L (ref 20–29)
Calcium: 10.2 mg/dL (ref 8.6–10.2)
Chloride: 98 mmol/L (ref 96–106)
Creatinine, Ser: 1.47 mg/dL — ABNORMAL HIGH (ref 0.76–1.27)
GFR calc Af Amer: 56 mL/min/{1.73_m2} — ABNORMAL LOW (ref >59)
GFR calc non Af Amer: 48 mL/min/{1.73_m2} — ABNORMAL LOW (ref >59)
Glucose: 89 mg/dL (ref 65–99)
Potassium: 4.6 mmol/L (ref 3.5–5.2)
Sodium: 142 mmol/L (ref 134–144)

## 2020-02-08 LAB — CBC
Hematocrit: 42.9 % (ref 37.5–51.0)
Hemoglobin: 14.4 g/dL (ref 13.0–17.7)
MCH: 31.5 pg (ref 26.6–33.0)
MCHC: 33.6 g/dL (ref 31.5–35.7)
MCV: 94 fL (ref 79–97)
Platelets: 217 10*3/uL (ref 150–450)
RBC: 4.57 x10E6/uL (ref 4.14–5.80)
RDW: 12.8 % (ref 11.6–15.4)
WBC: 8.4 10*3/uL (ref 3.4–10.8)

## 2020-02-09 ENCOUNTER — Ambulatory Visit (INDEPENDENT_AMBULATORY_CARE_PROVIDER_SITE_OTHER): Payer: 59 | Admitting: Internal Medicine

## 2020-02-09 ENCOUNTER — Encounter: Payer: Self-pay | Admitting: Internal Medicine

## 2020-02-09 VITALS — BP 130/80 | HR 64 | Temp 97.8°F | Ht 69.0 in | Wt 229.5 lb

## 2020-02-09 DIAGNOSIS — K625 Hemorrhage of anus and rectum: Secondary | ICD-10-CM

## 2020-02-09 DIAGNOSIS — K649 Unspecified hemorrhoids: Secondary | ICD-10-CM

## 2020-02-09 MED ORDER — OMEPRAZOLE 20 MG PO CPDR: 20.0000 mg | DELAYED_RELEASE_CAPSULE | Freq: Every day | ORAL | 3 refills | Status: DC

## 2020-02-09 MED ORDER — HYDROCORTISONE ACETATE 25 MG RE SUPP: 25.0000 mg | Freq: Every day | RECTAL | 3 refills | Status: DC

## 2020-02-09 NOTE — Patient Instructions (Addendum)
We have sent the following medications to your pharmacy for you to pick up at your convenience:  Anusol HC suppositories, Omeprazole  Take 1 tablespoon of Metamucil in water or juice daily

## 2020-02-09 NOTE — Progress Notes (Signed)
HISTORY OF PRESENT ILLNESS:  Randall Best is a 69 y.o. male with multiple significant medical problems as listed below including atrial fibrillation for which she is on chronic anticoagulation therapy.  Patient presents today with chief complaint of rectal bleeding.  I last saw the patient December 08, 2017 when he underwent routine screening colonoscopy.  He was found to have a diminutive adenoma which was removed.  Also internal hemorrhoids and hypertrophic anal papilla.  No other abnormality.  Routine follow-up in 5 years recommended.  The day of his procedure patient was noted to be in atrial fibrillation.  This was a new diagnosis.  He was started on baby aspirin and seen by his cardiologist.  His history since that time is documented in the chart.  He tells me he was well until 2 weeks ago when he has seen some red blood in the toilet intermittently.  He shows me a picture from his cell phone.  No associated pain.  Otherwise well.  He underwent blood work yesterday which was unremarkable including a normal hemoglobin of 14.4.  REVIEW OF SYSTEMS:  All non-GI ROS negative unless otherwise stated in the HPI except for ankle swelling  Past Medical History:  Diagnosis Date  . Asthma    as child  . Atrial fibrillation, rapid ([place]) 12/08/2017   Diagnosed during colonoscopy -presumably new onset  . BBB (bundle branch block)   . GERD (gastroesophageal reflux disease)    use meds prn  . Glaucoma   . Headache    remote h/o migraines, none in years  . Hyperlipidemia   . NICM (nonischemic cardiomyopathy) ([place]) 04/2019   Echo 05/2019 - EF reduced to <20%.  Has permanent Afib & LBBB (normal Coronaries on Cath). -->  After cardioversion and titration medications, EF up to 35-40% with  global hypokinesis..  . Thoracic aortic aneurysm ([place]) 10/20/2019   Ascending thoracic aortic estimated 44 mm on echo  . TIA (transient ischemic attack) 01/13/2018  . Ventral hernia     Past Surgical History:   Procedure Laterality Date  . CARDIAC EVENT MONITOR  12/2016   showed persistent Afib -monitor return in early because of persistent A. fib.  Several episodes of severe RVR with rates in the 170s were noted.   [name] CARDIOVERSION N/A 01/13/2018   Procedure: CARDIOVERSION;  Surgeon: Pixie Casino, MD;  Location[facility] ENDOSCOPY;  Service: Cardiovascular;  Laterality: N/A; unsuccessful.-->  Post-cardioversion, the patient had left-sided facial numbness and weakness.  Ruled out for stroke.  [name] CARDIOVERSION N/A 08/19/2019   Procedure: CARDIOVERSION;  Surgeon: Larey Dresser, MD;  Location: [facility] ENDOSCOPY;  Service: Cardiovascular;  Laterality: N/A;  . INSERTION OF MESH N/A 07/22/2017   Procedure: INSERTION OF MESH;  Surgeon: [name], MD;  Location: [facility];  Service: General;  Laterality: N/A;  BILATERAL TAP BLOCK  . NM MYOVIEW LTD  12/2017   Shows reduced EF, but no evidence of ischemia or infarction.  EF 40 and 45%.  INTERMEDIATE RISK due to reduced function.  EF notably better on echo (50 and 55%)  . RIGHT/LEFT HEART CATH AND CORONARY ANGIOGRAPHY N/A 07/14/2019   Procedure: RIGHT/LEFT HEART CATH AND CORONARY ANGIOGRAPHY;  Surgeon: [name], MD;  Location: [facility] LAB;; Angiographically normal coronary arteries. RHC  - PCWP & LVEDP 17 mmHg.  CO-CI 4.92-2.34  . TEE WITHOUT CARDIOVERSION N/A 01/13/2018   Procedure: TRANSESOPHAGEAL ECHOCARDIOGRAM (TEE) with cardioversion - ;  Surgeon: Pixie Casino, MD;  Location: [facility] ENDOSCOPY;  Service: Cardiovascular;  Laterality: N/A;  Mild concentric LVH.  EF 55 to 60%.  No R WMA.  Mild ascending aortic dilation of 4.2 cm.  Dilated left atrium.  No thrombus.  . TONSILLECTOMY    . TRANSTHORACIC ECHOCARDIOGRAM  10/11/2019   EF up to 35-40%.-Moderate-severely decreased function.  "GR 1 DD ".  Normal atrial sizes.  Moderate ascending aorta-44 mL.  Relatively normal valves.  . TRANSTHORACIC ECHOCARDIOGRAM  06/2019    Severely reduced function with  diffuse hypokinesis and septal dyskinesis.  EF <20%.  Cannot exclude LV thrombus.  Reduced RV function with mildly elevated RV P--37 mmHg.  Moderate RA and mild LA dilation.  A centimeter dilation 42 mm  . VENTRAL HERNIA REPAIR N/A 07/22/2017   Procedure: OPEN VENTRAL HERNIA REPAIR WITH MESH ERAS PATHWAY;  Surgeon: [name], MD;  Location: [facility];  Service: General;  Laterality: N/A;  BILATERAL TAP BLOCK    Social History Randall Best  reports that he has never smoked. He has never used smokeless tobacco. He reports current alcohol use. He reports that he does not use drugs.  family history includes CVA in his maternal grandmother; Diabetes in his mother; Heart failure in his father; Hypertension in his brother.  Allergies  Allergen Reactions  . Prilosec [Omeprazole] Nausea And Vomiting  . Statins Other (See Comments)    Muscle pains, mouth sores  . Zetia [Ezetimibe] Other (See Comments)    Broke out in sores/ muscle pain       PHYSICAL EXAMINATION: Vital signs: BP 130/80 (BP Location: Left Arm, Patient Position: Sitting, Cuff Size: Normal)   Pulse 64   Temp 97.8 F (36.6 C)   Ht 5\' 9"  (1.753 m) Comment: height measured without shoes  Wt 229 lb 8 oz (104.1 kg)   BMI 33.89 kg/m   Constitutional: generally well-appearing, no acute distress Psychiatric: alert and oriented x3, cooperative Eyes: extraocular movements intact, anicteric, conjunctiva pink Mouth: oral pharynx moist, no lesions Neck: supple no lymphadenopathy Cardiovascular: heart regular rate and rhythm, no murmur Lungs: clear to auscultation bilaterally Abdomen: soft, nontender, nondistended, no obvious ascites, no peritoneal signs, normal bowel sounds, no organomegaly Rectal: Inflamed internal hemorrhoids.  Otherwise negative exam Extremities: no clubbing or cyanosis.  1+ lower extremity edema bilaterally Skin: no lesions on visible extremities except for chronic stasis changes of the lower  extremities Neuro: No focal deficits.  Cranial nerves intact  ASSESSMENT:  1.  Minor rectal bleeding secondary to internal hemorrhoids 2.  Colonoscopy 2019 with diminutive adenoma and internal hemorrhoids 3.  Multiple significant medical problems in a 69 year old on Xarelto  PLAN:  1.  Prescribe Anusol HC suppositories.  Multiple refills 2.  Metamucil 1 tablespoon daily 3.  Discussed with him prophylactic PPI therapy to reduce the risk of upper GI bleeding.  We will prescribe omeprazole 20 mg daily.  Medication risks reviewed 4.  Contact the office if there are any further problems or questions 5.  Otherwise repeat surveillance colonoscopy around 2024 A total of 30 minutes were spent preparing to see the patient, reviewing laboratory test, obtaining history, performing comprehensive physical exam, counseling the patient regarding his above conditions.  Ordering medications as outlined, and documenting clinical information in the health record.

## 2020-02-10 ENCOUNTER — Telehealth: Payer: Self-pay | Admitting: Cardiovascular Disease

## 2020-02-10 DIAGNOSIS — Z79899 Other long term (current) drug therapy: Secondary | ICD-10-CM

## 2020-02-10 MED ORDER — METOLAZONE 2.5 MG PO TABS: 2.5000 mg | ORAL_TABLET | ORAL | 3 refills | Status: DC

## 2020-02-10 NOTE — Telephone Encounter (Signed)
   Pt is returning call regarding lab results  Please call

## 2020-02-10 NOTE — Telephone Encounter (Signed)
Per Dr. Sallyanne Kuster: No evidence of serious bleeding. Continue Xarelto. Renal function a little worse than baseline, suggsting he is on the "dry" side. I would suggest decreasing the metolazone to twice weekly instead of 3 x weekly and recheck BMET in 3 weeks.  Spoke with patient. Reviewed CBC and BMP results. Orders placed for repeat BMP in 3 weeks. Patient will decrease metolazone to twice a week from 3x a week.   Pt reports he was seen by GI and rectal bleed was an inflamed hemorrhoid.

## 2020-02-23 ENCOUNTER — Other Ambulatory Visit: Payer: Self-pay | Admitting: Cardiology

## 2020-02-28 ENCOUNTER — Other Ambulatory Visit: Payer: Self-pay | Admitting: Cardiology

## 2020-02-28 ENCOUNTER — Other Ambulatory Visit: Payer: Self-pay

## 2020-02-28 ENCOUNTER — Ambulatory Visit (INDEPENDENT_AMBULATORY_CARE_PROVIDER_SITE_OTHER): Payer: 59 | Admitting: Cardiology

## 2020-02-28 VITALS — BP 128/76 | HR 63 | Temp 97.2°F | Ht 69.0 in | Wt 231.0 lb

## 2020-02-28 DIAGNOSIS — Z7901 Long term (current) use of anticoagulants: Secondary | ICD-10-CM | POA: Insufficient documentation

## 2020-02-28 DIAGNOSIS — E782 Mixed hyperlipidemia: Secondary | ICD-10-CM

## 2020-02-28 DIAGNOSIS — I5042 Chronic combined systolic (congestive) and diastolic (congestive) heart failure: Secondary | ICD-10-CM

## 2020-02-28 DIAGNOSIS — I712 Thoracic aortic aneurysm, without rupture, unspecified: Secondary | ICD-10-CM

## 2020-02-28 DIAGNOSIS — Z79899 Other long term (current) drug therapy: Secondary | ICD-10-CM | POA: Insufficient documentation

## 2020-02-28 DIAGNOSIS — I4819 Other persistent atrial fibrillation: Secondary | ICD-10-CM | POA: Diagnosis not present

## 2020-02-28 DIAGNOSIS — I495 Sick sinus syndrome: Secondary | ICD-10-CM | POA: Diagnosis not present

## 2020-02-28 MED ORDER — NITROGLYCERIN 0.4 MG SL SUBL: 0.4000 mg | SUBLINGUAL_TABLET | SUBLINGUAL | 3 refills | Status: DC | PRN

## 2020-02-28 MED ORDER — METOLAZONE 2.5 MG PO TABS: 2.5000 mg | ORAL_TABLET | ORAL | 3 refills | Status: DC | PRN

## 2020-02-28 NOTE — Patient Instructions (Signed)
Medication Instructions:  STOP CARVEDILOL   TAKE METOLAZONE AS NEEDED ONLY  *If you need a refill on your cardiac medications before your next appointment, please call your pharmacy*  Lab Work: CMET,DIG, TSH, AND FASTING LIPID TODAY If you have labs (blood work) drawn today and your tests are completely normal, you will receive your results only by:  [place] (if you have MyChart) OR A paper copy in the mail.  If you have any lab test that is abnormal or we need to change your treatment, we will call you to review the results. You may go to any [facility] that is convenient for you however, we do have a lab in our office that is able to assist you. You DO NOT need an appointment for our lab. The lab is open 8:00am and closes at 4:00pm. Lunch 12:45 - 1:45pm.  Follow-Up: Your next appointment:  6 month(s) Please call our office 2 months in advance to schedule this appointment In Person with [name], MD.  At [facility], you and your health needs are our priority.  As part of our continuing mission to provide you with exceptional heart care, we have created designated Provider Care Teams.  These Care Teams include your primary Cardiologist (physician) and Advanced Practice Providers (APPs -  Physician Assistants and Nurse Practitioners) who all work together to provide you with the care you need, when you need it.  We recommend signing up for the patient portal called "MyChart".  Sign up information is provided on this After Visit Summary.  MyChart is used to connect with patients for Virtual Visits (Telemedicine).  Patients are able to view lab/test results, encounter notes, upcoming appointments, etc.  Non-urgent messages can be sent to your provider as well.   To learn more about what you can do with MyChart, go to [email or web address].

## 2020-02-28 NOTE — Progress Notes (Signed)
Primary Care Provider: Lyman Bishop, DO Cardiologist: [name], MD Electrophysiologist: Will Meredith Leeds, MD  Clinic Note: Chief Complaint  Patient presents with  . Follow-up    Pretty stable  . Atrial Fibrillation    Seems to be in sinus rhythm (event monitor results)  . Congestive Heart Failure    Still have smothering spells.  Weight is up, but seems to be pounds not volume   HPI:    Randall Best is a 69 y.o. male with a PMH notable for PERSISTENT A. FIB and LEFT BUNDLE BRANCH BLOCK with NONISCHEMIC CARDIOMYOPATHY below who presents today for 80-month follow-up.  Cardiac History   Dx - Afib (Afib RVR in Jan 2019) ->failed TEE/DCCV (complicated by TIA Sx) . Planned Rate control with Diltiazem.  Telehealth visit 02/2019: "smothering spells when lying down", DOE & dry hacking cough. --> diltiazem restarted & started lasix.  June f/u ->worse"smothering" &DOE. Rate still poorly controlled. -->EKG w/ LBBB with Afib RVR  2 D Echo ordered (but due to COVID delayed until Aug). --> EF <20%with global HK, EKG now showed LBBB with Afib RVR -> urgent f/u: d/c Dilt - change to Carvedilol & Lasix increased.  ? 9/3 - [facility]  - normal coronaries. PCWP & LVEDP 17 mmHg. CO-I:4.92-2.34-> Lasix 40 mg BID on T/T & 40 mg on other days.;Started digoxin and Spiro 12.5 mg daily  9/22: EP c/s - Dr. Curt Bears: Concern for Sx of Low Output HF -->[facility] (Dr.McLean) for NYHA Cl III HF --> started Amiodarone for Rhythm Control (to avoid Afib) ? Would be CRT-D candidate if EF still down on best tolerated meds   9/24: [facility] (Dr. Aundra Dubin) ->still noted dry cough, PND &orthopnea with DOE ~ mod exertion. Drinks lots of water. (pt concerned about facility fees for CHF clinic) ? Continued Amiodarone - Fasciliated DCCV 10/9 (thought for ? Afib Ablation) & potential CRT-D. Per EP => restore sinus rhythm & hope it will improve EF.  If NSR restored -->recheck  Echo in ~3 months: if EF <35% -->refer for CRT-D,   If not able to DCCV, would likely proceed with crt-D & ? AVN ablation to allow BiV pacing. ? Continued Carvedilol, digoxin, spironolactone & started on Entesto.  (Low BP / unable to tolerate increased doses of Entresto - takes low-dose in the morning and intermediate dose in the evening) ? Home dry weight of 204-205 pounds.  EP (Dr. Curt Bears) 12/23 -> Echo EF the 35 and 40% (no ICD).  Still noted to be asymptomatic of A. fib, just having heart failure symptoms.;  Symptoms were better on increased Lasix and digoxin; noted exertional dyspnea. -->  Preference-= to avoid pacemaker implant.  Increased spironolactone to 25 mg, Lasix 120 mg for 2 days then 80 twice daily for 2 days.  Randall Best was last seen on November 18, 2019 -> noted that he felt better with increased dose of Lasix was taking 120 mg morning and 80 mg the evening.  Having intermittent episodes of orthopnea with occasional smothering.  But notably improved.  Weight was still not at goal-was 214 pounds.  Noted fatigue but better.  Anxiety associate with "smothering".   Zio patch 7-day ordered to determine A. fib burden, and to evaluate for bradycardia as his EKG had a rate of 44 bpm.  Added intermittent dose of Zaroxolyn 2.5 mg.  Recent Hospitalizations: None  Reviewed  CV studies:    The following studies were reviewed today: (if  available, images/films reviewed: From Epic Chart or Care Everywhere) . MARCH 2021-EVENT MONITOR: Minimum heart rate 38 bpm, max 100 bpm.  Average 58 bpm.  Sinus rhythm with 1 degree AVB.  Rare isolated PACs occasional triplets.  Rare isolated PVCs.  No A. Fib. o Recommendation was to wean off carvedilol.  Interval History:   [name] returns here today for follow-up stating that occasionally he has his smothering sensations and that the chest discomfort off and on.  Maybe once or twice a month.  The smothering sensations happen every  couple weeks.  He also noticed that he will have some of the chest discomfort with more vigorous exertion.  He sleeps at roughly 45 degree angle but notes that his edema is pretty well controlled.  Is smothering episodes when he wakes up are often associated with chest tightness but they are not as frequent.  He says the swelling is usually mostly in the right foot not the left.  Its pretty stable.  He has not had any sensation of irregular heartbeat.  He has not used Zaroxolyn.  Seems to be tolerating Crestor. He brings a blood pressure record indicating range from 110/75 up to 144/63.  Majority of the SBP recordings are in the 120-110 range.  His heart rates have actually picked up there is one recording that was spurious 114 that was probably a double count as the recheck was 55.  There was only 2 readings in the beginning of April heart rates less than 50.  Noted he was started on omeprazole by his PCP.  Cardiovascular  Review of Symptoms (Summary): positive for - dyspnea on exertion, edema, orthopnea, paroxysmal nocturnal dyspnea and Chest tightness as noted above negative for - irregular heartbeat, palpitations, rapid heart rate, shortness of breath or Syncope/syncope, TIA/amaurosis fugax, claudication.  Melena, hematochezia can hematuria or epistaxis.  The patient does not have symptoms concerning for COVID-19 infection (fever, chills, cough, or new shortness of breath).  The patient is practicing social distancing & Masking.   He has had both of his COVID-19 vaccine injections.  REVIEWED OF SYSTEMS   Review of Systems  Constitutional: Negative for malaise/fatigue and weight loss (Has had significant weight gain).  Gastrointestinal: Positive for abdominal pain and heartburn.  Musculoskeletal: Negative for falls and joint pain.  Neurological: Positive for dizziness.  Psychiatric/Behavioral: Negative for memory loss.  Otherwise noted above.  I have reviewed and (if needed) personally  updated the patient's problem list, medications, allergies, past medical and surgical history, social and family history.   PAST MEDICAL HISTORY   Past Medical History:  Diagnosis Date  . Asthma    as child  . Atrial fibrillation, rapid ([place]) 12/08/2017   Diagnosed during colonoscopy -presumably new onset  . BBB (bundle branch block)   . GERD (gastroesophageal reflux disease)    use meds prn  . Glaucoma   . Headache    remote h/o migraines, none in years  . Hyperlipidemia   . NICM (nonischemic cardiomyopathy) ([place]) 04/2019   Echo 05/2019 - EF reduced to <20%.  Has permanent Afib & LBBB (normal Coronaries on Cath). -->  After cardioversion and titration medications, EF up to 35-40% with  global hypokinesis..  . Thoracic aortic aneurysm ([place]) 10/20/2019   Ascending thoracic aortic estimated 44 mm on echo  . TIA (transient ischemic attack) 01/13/2018  . Ventral hernia     PAST SURGICAL HISTORY   Past Surgical History:  Procedure Laterality Date  . CARDIAC EVENT MONITOR  12/2016   showed persistent Afib -monitor return in early because of persistent A. fib.  Several episodes of severe RVR with rates in the 170s were noted.   [name] CARDIOVERSION N/A 01/13/2018   Procedure: CARDIOVERSION;  Surgeon: Pixie Casino, MD;  Location: [facility] ENDOSCOPY;  Service: Cardiovascular;  Laterality: N/A; unsuccessful.-->  Post-cardioversion, the patient had left-sided facial numbness and weakness.  Ruled out for stroke.  [name] CARDIOVERSION N/A 08/19/2019   Procedure: CARDIOVERSION;  Surgeon: Larey Dresser, MD;  Location: [facility] ENDOSCOPY;  Service: Cardiovascular;  Laterality: N/A;  . INSERTION OF MESH N/A 07/22/2017   Procedure: INSERTION OF MESH;  Surgeon: [name], MD;  Location: [place];  Service: General;  Laterality: N/A;  BILATERAL TAP BLOCK  . NM MYOVIEW LTD  12/2017   Shows reduced EF, but no evidence of ischemia or infarction.  EF 40 and 45%.  INTERMEDIATE RISK due to reduced function.  EF notably  better on echo (50 and 55%)  . RIGHT/LEFT HEART CATH AND CORONARY ANGIOGRAPHY N/A 07/14/2019   Procedure: RIGHT/LEFT HEART CATH AND CORONARY ANGIOGRAPHY;  Surgeon: [name], MD;  Location: [facility] CV LAB;; Angiographically normal coronary arteries. RHC  - PCWP & LVEDP 17 mmHg.  CO-CI 4.92-2.34  . TEE WITHOUT CARDIOVERSION N/A 01/13/2018   Procedure: TRANSESOPHAGEAL ECHOCARDIOGRAM (TEE) with cardioversion - ;  Surgeon: Pixie Casino, MD;  Location[facility] ENDOSCOPY;  Service: Cardiovascular;  Laterality: N/A;  Mild concentric LVH.  EF 55 to 60%.  No R WMA.  Mild ascending aortic dilation of 4.2 cm.  Dilated left atrium.  No thrombus.  . TONSILLECTOMY    . TRANSTHORACIC ECHOCARDIOGRAM  10/11/2019   EF up to 35-40%.-Moderate-severely decreased function.  "GR 1 DD ".  Normal atrial sizes.  Moderate ascending aorta-44 mL.  Relatively normal valves.  . TRANSTHORACIC ECHOCARDIOGRAM  06/2019    Severely reduced function with diffuse hypokinesis and septal dyskinesis.  EF <20%.  Cannot exclude LV thrombus.  Reduced RV function with mildly elevated RV P--37 mmHg.  Moderate RA and mild LA dilation.  A centimeter dilation 42 mm  . VENTRAL HERNIA REPAIR N/A 07/22/2017   Procedure: OPEN VENTRAL HERNIA REPAIR WITH MESH ERAS PATHWAY;  Surgeon: [name], MD;  Location: [place];  Service: General;  Laterality: N/A;  BILATERAL TAP BLOCK    MEDICATIONS/ALLERGIES   Current Meds  Medication Sig  . acetaminophen (TYLENOL) 325 MG tablet Take 650 mg by mouth every 6 (six) hours as needed.  [name] amiodarone (PACERONE) 200 MG tablet Take 1 tablet (200 mg total) by mouth daily.  . benzonatate (TESSALON PERLES) 100 MG capsule Take 1 capsule (100 mg total) by mouth every 6 (six) hours as needed for cough.  . Cholecalciferol (VITAMIN D3) 5000 units TABS Take 5,000 Units by mouth daily.   . digoxin (LANOXIN) 0.125 MG tablet Take 1 tablet (0.125 mg total) by mouth daily.  [name] ENTRESTO 24-26 MG Take 2 tablets by mouth  daily. Take one tablet in the morning.  [name] ENTRESTO 49-51 MG TAKE 1 TABLET BY MOUTH TWICE A DAY  . furosemide (LASIX) 40 MG tablet TAKE 1 TABLET BY MOUTH 2 TIMES DAILY. MAY TAKE AN ADDITIONAL DOSE IF WT IS GREATER THAN 3 LBS DAILY.  . hydrocortisone (ANUSOL-HC) 25 MG suppository Place 1 suppository (25 mg total) rectally at bedtime.  . metolazone (ZAROXOLYN) 2.5 MG tablet Take 1 tablet (2.5 mg total) by mouth as needed. 30 minutes prior to your furosemide (lasix) dose  .  omeprazole (PRILOSEC) 20 MG capsule Take 1 capsule (20 mg total) by mouth daily.  . potassium chloride SA (KLOR-CON M20) 20 MEQ tablet Take 20 mEq by mouth as needed. dehydration  . Specialty Vitamins Products (ULTRA MAN PO) Take 1 tablet by mouth daily.   Alveda Reasons 20 MG TABS tablet TAKE 1 TABLET (20 MG TOTAL) BY MOUTH DAILY WITH SUPPER.  . [DISCONTINUED] metolazone (ZAROXOLYN) 2.5 MG tablet Take 1 tablet (2.5 mg total) by mouth 2 (two) times a week. 30 minutes prior to your furosemide (lasix) dose    Allergies  Allergen Reactions  . Prilosec [Omeprazole] Nausea And Vomiting  . Statins Other (See Comments)    Muscle pains, mouth sores  . Zetia [Ezetimibe] Other (See Comments)    Broke out in sores/ muscle pain    SOCIAL HISTORY/FAMILY HISTORY   Reviewed in Epic:  Pertinent findings: No new changes  OBJCTIVE -PE, EKG, labs   Wt Readings from Last 3 Encounters:  02/28/20 231 lb (104.8 kg)  02/09/20 229 lb 8 oz (104.1 kg)  11/18/19 221 lb (100.2 kg)    Physical Exam: BP 128/76   Pulse 63   Temp (!) 97.2 F (36.2 C)   Ht 5\' 9"  (1.753 m)   Wt 231 lb (104.8 kg)   SpO2 95%   BMI 34.11 kg/m  Physical Exam  Constitutional: He is oriented to person, place, and time. He appears well-developed and well-nourished. No distress.  Healthy-appearing.  Well-groomed.  HENT:  Head: Normocephalic and atraumatic.  Neck: JVD (Difficult assess, may be mildly elevated) present. No hepatojugular reflux present. Carotid bruit  is not present.  Cardiovascular: Normal rate, regular rhythm, S1 normal, S2 normal and intact distal pulses.  No extrasystoles are present. PMI is not displaced. Exam reveals gallop, S4 and distant heart sounds. Exam reveals no decreased pulses.  No murmur heard. Split S2 versus S4 gallop.  Pulmonary/Chest: Effort normal and breath sounds normal. No respiratory distress. He has no wheezes.  Abdominal: Bowel sounds are normal.  Musculoskeletal:        General: Edema (1-2+) present. Normal range of motion.     Cervical back: Normal range of motion and neck supple.  Neurological: He is alert and oriented to person, place, and time.  Psychiatric: He has a normal mood and affect. His behavior is normal. Judgment and thought content normal.  Vitals reviewed.   Adult ECG Report N/A  Recent Labs:  --> Labs checked today   Lab Results  Component Value Date   CHOL 232 (H) 02/28/2020   HDL 55 02/28/2020   LDLCALC 141 (H) 02/28/2020   TRIG 200 (H) 02/28/2020   CHOLHDL 4.2 02/28/2020   Lab Results  Component Value Date   CREATININE 1.33 (H) 02/28/2020   BUN 20 02/28/2020   NA 140 02/28/2020   K 4.6 02/28/2020   CL 96 02/28/2020   CO2 25 02/28/2020   Lab Results  Component Value Date   TSH 1.580 02/28/2020    ASSESSMENT/PLAN    Problem List Items Addressed This Visit    Persistent atrial fibrillation ([place]): CHA2DS2-VASc Score 4 (CHF, HTN, Age 38, Aortic Plaque) (Chronic)    Maintaining sinus rhythm on amiodarone.  We have stopped carvedilol because of bradycardia.  No bleeding issues on Xarelto.      Relevant Medications   metolazone (ZAROXOLYN) 2.5 MG tablet   Other Relevant Orders   TSH (Completed)   Comprehensive metabolic panel (Completed)   CBC (Completed)   Lipid Profile (  Completed)   Digoxin level (Completed)   Chronic combined systolic and diastolic heart failure (HCC) - Primary (Chronic)    Despite his weight being up, he seems euvolemic.  Not really having  orthopnea, does not have a lot of edema in his JVD is not that significant.  Clearly has class II and occasionally class III symptoms at baseline.  Due to see EP soon to discuss the possibility of BiV pacemaker as he is no longer an ICD candidate with EF of 35 to 40%.  I do think the resynchronization therapy would likely be helpful especially for able to maintain sinus rhythm.  We have previously been shooting for a dry weight of 210 pounds square.  At this time I do not suggest anywhere near an option.  Plan: Change Zaroxolyn as needed for additional weight gain or swelling.  Currently taking 40 mg twice daily Lasix.  He is on Entresto, I would like to see stable blood pressures for 1 more reading and then potentially now that he is off carvedilol we can increase Entresto to intermediate dose twice daily.  (Currently taking low-dose in the morning and intermediate-dose in the evening)  Probably 220 pounds again dry weight, so he will watch and try to adjust.      Relevant Medications   metolazone (ZAROXOLYN) 2.5 MG tablet   Other Relevant Orders   TSH (Completed)   Comprehensive metabolic panel (Completed)   CBC (Completed)   Lipid Profile (Completed)   Digoxin level (Completed)   Thoracic aortic aneurysm (HCC) (Chronic)    Will be due for CT angiogram of the chest abdomen pelvis to be ordered at next follow-up visit to assess aneurysm.      Relevant Medications   metolazone (ZAROXOLYN) 2.5 MG tablet   Other Relevant Orders   TSH (Completed)   Comprehensive metabolic panel (Completed)   CBC (Completed)   Lipid Profile (Completed)   Digoxin level (Completed)   Hyperlipidemia (Chronic)    Seems to be tolerating his statin. We did check lipids as well as chemistry panel today. ->  Total cholesterol is not well controlled nor is his LDL.  For now continue current dose of statin, and will probably need to reassess at follow-up.  We will add digoxin for therapeutic monitoring as well  as TSH for amiodarone monitoring.      Relevant Medications   metolazone (ZAROXOLYN) 2.5 MG tablet   Tachycardia-bradycardia syndrome (HCC) (Chronic)    Maintaining sinus rhythm with amiodarone.  We have weaned him off his carvedilol because of profound bradycardia.  This has helped his energy level and definitely improved his heart rate is noted in his monitoring.  His blood pressure also has maintained stable levels.   Perhaps in future follow-up we can increase his Entresto.      Relevant Medications   metolazone (ZAROXOLYN) 2.5 MG tablet   Other Relevant Orders   TSH (Completed)   Comprehensive metabolic panel (Completed)   CBC (Completed)   Lipid Profile (Completed)   Digoxin level (Completed)   Long term current use of amiodarone (Chronic)    Check TFTs as well as LFT.  Needs annual eye exam. Should be due for PFT evaluation in the fall.      Relevant Orders   TSH (Completed)   Comprehensive metabolic panel (Completed)   CBC (Completed)   Lipid Profile (Completed)   Digoxin level (Completed)   On continuous oral anticoagulation    No bleeding issues on Xarelto.  Relevant Orders   TSH (Completed)   Comprehensive metabolic panel (Completed)   CBC (Completed)   Lipid Profile (Completed)   Digoxin level (Completed)       COVID-19 Education: The signs and symptoms of COVID-19 were discussed with the patient and how to seek care for testing (follow up with PCP or arrange E-visit).   The importance of social distancing and COVID-19 vaccination was discussed today.  I spent a total of 250minutes with the patient. >  50% of the time was spent in direct patient consultation.  Additional time spent with chart review  / charting (studies, outside notes, etc): 14 Total Time: 42 min   Current medicines are reviewed at length with the patient today.  (+/- concerns) n/a  Notice: This dictation was prepared with Dragon dictation along with smaller phrase technology. Any  transcriptional errors that result from this process are unintentional and may not be corrected upon review.  Patient Instructions / Medication Changes & Studies & Tests Ordered   Patient Instructions  Medication Instructions:  STOP CARVEDILOL   TAKE METOLAZONE AS NEEDED ONLY  *If you need a refill on your cardiac medications before your next appointment, please call your pharmacy*  Lab Work: CMET,DIG, TSH, AND FASTING [place] If you have labs (blood work) drawn today and your tests are completely normal, you will receive your results only by:  [place] (if you have MyChart) OR A paper copy in the mail.  If you have any lab test that is abnormal or we need to change your treatment, we will call you to review the results. You may go to any [facility] that is convenient for you however, we do have a lab in our office that is able to assist you. You DO NOT need an appointment for our lab. The lab is open 8:00am and closes at 4:00pm. Lunch 12:45 - 1:45pm.  Follow-Up: Your next appointment:  6 month(s) Please call our office 2 months in advance to schedule this appointment In Person with [name], MD.  At [facility], you and your health needs are our priority.  As part of our continuing mission to provide you with exceptional heart care, we have created designated Provider Care Teams.  These Care Teams include your primary Cardiologist (physician) and Advanced Practice Providers (APPs -  Physician Assistants and Nurse Practitioners) who all work together to provide you with the care you need, when you need it.  We recommend signing up for the patient portal called "MyChart".  Sign up information is provided on this After Visit Summary.  MyChart is used to connect with patients for Virtual Visits (Telemedicine).  Patients are able to view lab/test results, encounter notes, upcoming appointments, etc.  Non-urgent messages can be sent to your provider as well.   To learn more about  what you can do with MyChart, go to [email or web address].         Studies Ordered:   Orders Placed This Encounter  Procedures  . TSH  . Comprehensive metabolic panel  . CBC  . Lipid Profile  . Digoxin level     [name], M.D., M.S. Interventional Cardiologist   Pager # [phone] Phone # [phone] [address] [place], [place] [address]   Thank you for choosing Heartcare at [facility]!!

## 2020-02-29 LAB — COMPREHENSIVE METABOLIC PANEL
ALT: 30 IU/L (ref 0–44)
AST: 23 IU/L (ref 0–40)
Albumin/Globulin Ratio: 2 (ref 1.2–2.2)
Albumin: 5.3 g/dL — ABNORMAL HIGH (ref 3.8–4.8)
Alkaline Phosphatase: 61 IU/L (ref 39–117)
BUN/Creatinine Ratio: 15 (ref 10–24)
BUN: 20 mg/dL (ref 8–27)
Bilirubin Total: 0.4 mg/dL (ref 0.0–1.2)
CO2: 25 mmol/L (ref 20–29)
Calcium: 10.1 mg/dL (ref 8.6–10.2)
Chloride: 96 mmol/L (ref 96–106)
Creatinine, Ser: 1.33 mg/dL — ABNORMAL HIGH (ref 0.76–1.27)
GFR calc Af Amer: 63 mL/min/{1.73_m2} (ref >59)
GFR calc non Af Amer: 55 mL/min/{1.73_m2} — ABNORMAL LOW (ref >59)
Globulin, Total: 2.7 g/dL (ref 1.5–4.5)
Glucose: 89 mg/dL (ref 65–99)
Potassium: 4.6 mmol/L (ref 3.5–5.2)
Sodium: 140 mmol/L (ref 134–144)
Total Protein: 8 g/dL (ref 6.0–8.5)

## 2020-02-29 LAB — LIPID PANEL
Chol/HDL Ratio: 4.2 ratio (ref 0.0–5.0)
Cholesterol, Total: 232 mg/dL — ABNORMAL HIGH (ref 100–199)
HDL: 55 mg/dL (ref >39)
LDL Chol Calc (NIH): 141 mg/dL — ABNORMAL HIGH (ref 0–99)
Triglycerides: 200 mg/dL — ABNORMAL HIGH (ref 0–149)
VLDL Cholesterol Cal: 36 mg/dL (ref 5–40)

## 2020-02-29 LAB — CBC
Hematocrit: 44.5 % (ref 37.5–51.0)
Hemoglobin: 14.7 g/dL (ref 13.0–17.7)
MCH: 30.5 pg (ref 26.6–33.0)
MCHC: 33 g/dL (ref 31.5–35.7)
MCV: 92 fL (ref 79–97)
Platelets: 211 10*3/uL (ref 150–450)
RBC: 4.82 x10E6/uL (ref 4.14–5.80)
RDW: 12.8 % (ref 11.6–15.4)
WBC: 9.1 10*3/uL (ref 3.4–10.8)

## 2020-02-29 LAB — DIGOXIN LEVEL: Digoxin, Serum: 0.7 ng/mL (ref 0.5–0.9)

## 2020-02-29 LAB — TSH: TSH: 1.58 u[IU]/mL (ref 0.450–4.500)

## 2020-03-01 ENCOUNTER — Encounter: Payer: Self-pay | Admitting: Cardiology

## 2020-03-01 NOTE — Assessment & Plan Note (Signed)
No bleeding issues on Xarelto.

## 2020-03-01 NOTE — Assessment & Plan Note (Signed)
Check TFTs as well as LFT.  Needs annual eye exam. Should be due for PFT evaluation in the fall.

## 2020-03-01 NOTE — Assessment & Plan Note (Signed)
Maintaining sinus rhythm with amiodarone.  We have weaned him off his carvedilol because of profound bradycardia.  This has helped his energy level and definitely improved his heart rate is noted in his monitoring.  His blood pressure also has maintained stable levels.   Perhaps in future follow-up we can increase his Entresto.

## 2020-03-01 NOTE — Assessment & Plan Note (Signed)
Despite his weight being up, he seems euvolemic.  Not really having orthopnea, does not have a lot of edema in his JVD is not that significant.  Clearly has class II and occasionally class III symptoms at baseline.  Due to see EP soon to discuss the possibility of BiV pacemaker as he is no longer an ICD candidate with EF of 35 to 40%.  I do think the resynchronization therapy would likely be helpful especially for able to maintain sinus rhythm.  We have previously been shooting for a dry weight of 210 pounds square.  At this time I do not suggest anywhere near an option.  Plan: Change Zaroxolyn as needed for additional weight gain or swelling.  Currently taking 40 mg twice daily Lasix.  He is on Entresto, I would like to see stable blood pressures for 1 more reading and then potentially now that he is off carvedilol we can increase Entresto to intermediate dose twice daily.  (Currently taking low-dose in the morning and intermediate-dose in the evening)  Probably 220 pounds again dry weight, so he will watch and try to adjust.

## 2020-03-01 NOTE — Assessment & Plan Note (Addendum)
Seems to be tolerating his statin. We did check lipids as well as chemistry panel today. ->  Total cholesterol is not well controlled nor is his LDL.  For now continue current dose of statin, and will probably need to reassess at follow-up.  We will add digoxin for therapeutic monitoring as well as TSH for amiodarone monitoring.

## 2020-03-01 NOTE — Assessment & Plan Note (Signed)
Maintaining sinus rhythm on amiodarone.  We have stopped carvedilol because of bradycardia.  No bleeding issues on Xarelto.

## 2020-03-01 NOTE — Assessment & Plan Note (Signed)
Will be due for CT angiogram of the chest abdomen pelvis to be ordered at next follow-up visit to assess aneurysm.

## 2020-03-14 NOTE — Telephone Encounter (Signed)
Follow Up  Patient is calling in to get the clearance form faxed to [facility] for the surgery that is scheduled for 03/19/20. Please fax to [phone] Attn: Dr. Baird Cancer

## 2020-03-15 ENCOUNTER — Telehealth: Payer: Self-pay | Admitting: *Deleted

## 2020-03-15 NOTE — Telephone Encounter (Signed)
PRIMARY CARDIOLOGIST - [facility] Pre-operative Risk Assessment    Request for surgical clearance:  1. What type of surgery is being performed?  CATARACT SURGERY   2. When is this surgery scheduled? TBD, ASAP  3. What type of clearance is required (medical clearance vs. Pharmacy clearance to hold med vs. Both)? BOTH  4. Are there any medications that need to be held prior to surgery and how long? FORM STATES  PATIENT DOES NOT NEED  TO STOP ANY MEDICATION. TOPICAL ANESTHESIA WITH IV MEDICATION WILL BE USED.( PATIENT IS TAKING XARELTO 20 MG )   5. Practice name and name of physician performing surgery?  [facility]  6. What is your office phone number [phone]   7.   What is your office fax number [place]  8.   Anesthesia type (None, local, MAC, general) ?  TOPICAL    Raiford Simmonds 03/15/2020, 2:50 PM  _________________________________________________________________   (provider comments below)

## 2020-03-15 NOTE — Telephone Encounter (Signed)
   Primary Cardiologist: [name], MD  Chart reviewed as part of pre-operative protocol coverage. Received additional request for information to [place] that was faxed to [facility]. Will fax recommendations and notes to this office.   Chart reviewed as part of pre-operative protocol coverage. Patient was contacted 03/15/2020 in reference to pre-operative risk assessment for pending surgery as outlined below.  Randall Best was last seen on 03/01/20 by Dr. Ellyn Hack.  Since that day, Randall Best has been stable from a cardiac standpoint. Reports having retina surgery back in January and now is having the other eye done.   Therefore, based on ACC/AHA guidelines, the patient would be at acceptable risk for the planned procedure without further cardiovascular testing.   Recommendations regarding Xarelto at that time were to hold for 3 days prior to procedure, therefore will follow through with this recommendation. I have discussed this with the patient.  I will route this recommendation to the requesting party via Epic fax function and remove from pre-op pool.  Please call with questions.  Reino Bellis, NP 03/15/2020, 3:15 PM

## 2020-03-15 NOTE — Telephone Encounter (Signed)
   Primary Cardiologist: [name], MD  Chart reviewed as part of pre-operative protocol coverage. Patient was contacted 03/15/2020 in reference to pre-operative risk assessment for pending surgery as outlined below.  Randall Best was last seen on 03/01/20 by Dr. Ellyn Hack.  Since that day, Randall Best has been stable from a cardiac standpoint. Reports having retina surgery back in January and now is having the other eye done.   Therefore, based on ACC/AHA guidelines, the patient would be at acceptable risk for the planned procedure without further cardiovascular testing.   Recommendations regarding Xarelto at that time were to hold for 3 days prior to procedure, therefore will follow through with this recommendation. I have discussed this with the patient.  I will route this recommendation to the requesting party via Epic fax function and remove from pre-op pool.  Please call with questions.  Reino Bellis, NP 03/15/2020, 8:45 AM

## 2020-04-20 ENCOUNTER — Other Ambulatory Visit: Payer: Self-pay | Admitting: Cardiology

## 2020-04-29 ENCOUNTER — Other Ambulatory Visit (HOSPITAL_COMMUNITY): Payer: Self-pay | Admitting: Cardiology

## 2020-05-01 ENCOUNTER — Other Ambulatory Visit: Payer: Self-pay

## 2020-05-01 ENCOUNTER — Ambulatory Visit (INDEPENDENT_AMBULATORY_CARE_PROVIDER_SITE_OTHER): Payer: 59 | Admitting: Cardiology

## 2020-05-01 ENCOUNTER — Encounter: Payer: Self-pay | Admitting: Cardiology

## 2020-05-01 VITALS — BP 120/78 | HR 68 | Ht 69.0 in | Wt 226.0 lb

## 2020-05-01 DIAGNOSIS — Z79899 Other long term (current) drug therapy: Secondary | ICD-10-CM

## 2020-05-01 DIAGNOSIS — I428 Other cardiomyopathies: Secondary | ICD-10-CM

## 2020-05-01 LAB — BASIC METABOLIC PANEL
BUN/Creatinine Ratio: 14 (ref 10–24)
BUN: 21 mg/dL (ref 8–27)
CO2: 25 mmol/L (ref 20–29)
Calcium: 9.5 mg/dL (ref 8.6–10.2)
Chloride: 97 mmol/L (ref 96–106)
Creatinine, Ser: 1.53 mg/dL — ABNORMAL HIGH (ref 0.76–1.27)
GFR calc Af Amer: 53 mL/min/{1.73_m2} — ABNORMAL LOW (ref >59)
GFR calc non Af Amer: 46 mL/min/{1.73_m2} — ABNORMAL LOW (ref >59)
Glucose: 99 mg/dL (ref 65–99)
Potassium: 4 mmol/L (ref 3.5–5.2)
Sodium: 140 mmol/L (ref 134–144)

## 2020-05-01 NOTE — Patient Instructions (Signed)
Medication Instructions:  Your physician recommends that you continue on your current medications as directed. Please refer to the Current Medication list given to you today.  *If you need a refill on your cardiac medications before your next appointment, please call your pharmacy*   Lab Work: None ordered If you have labs (blood work) drawn today and your tests are completely normal, you will receive your results only by: [name] MyChart Message (if you have MyChart) OR . A paper copy in the mail If you have any lab test that is abnormal or we need to change your treatment, we will call you to review the results.   Testing/Procedures: Your physician has recommended that you have a pacemaker inserted. A pacemaker is a small device that is placed under the skin of your chest or abdomen to help control abnormal heart rhythms. This device uses electrical pulses to prompt the heart to beat at a normal rate. Pacemakers are used to treat heart rhythms that are too slow. Wire (leads) are attached to the pacemaker that goes into the chambers of you heart. This is done in the hospital and usually requires and overnight stay. Please see the instructions below located under "other instructions".    Follow-Up: At [facility], you and your health needs are our priority.  As part of our continuing mission to provide you with exceptional heart care, we have created designated Provider Care Teams.  These Care Teams include your primary Cardiologist (physician) and Advanced Practice Providers (APPs -  Physician Assistants and Nurse Practitioners) who all work together to provide you with the care you need, when you need it.  We recommend signing up for the patient portal called "MyChart".  Sign up information is provided on this After Visit Summary.  MyChart is used to connect with patients for Virtual Visits (Telemedicine).  Patients are able to view lab/test results, encounter notes, upcoming appointments, etc.   Non-urgent messages can be sent to your provider as well.   To learn more about what you can do with MyChart, go to [email or web address].    Your next appointment:   3 month(s) after your procedure  The format for your next appointment:   In Person  Provider:   Allegra Lai, MD   Thank you for choosing [facility]!!   Trinidad Curet, RN [phone]     Other Instructions   Implantable Device Instructions  You are scheduled for: BiVentricular permanent pacemaker implant on 05/31/2020 with Dr. Curt Bears.  1.    On the day of your procedure 05/31/2020 you will go to [facility] [phone]) at 10:30 am.  Dennis Bast will go to the main entrance [address] [employer]) and enter where the [employer] are.  You will check in at ADMITTING.  You may have one support person come in to the hospital with you.  They will be asked to wait in the waiting room.   2.   Do not eat or drink after midnight prior to your procedure.   3.   On the morning of your procedure do NOT take any medication.  4.  The night before your procedure and the morning of your procedure scrub your neck/chest with surgical scrub.  An instruction letter is included below.   5.  Plan for an overnight stay.  If you use your phone frequently bring your phone charger.  When you are discharged you will need someone to drive you home.   6.  You  will follow up with the [facility] 10-14 days after your procedure. You will follow up with Dr. Curt Bears 91 days after your procedure.  These appointments will be made for you.   * If you have ANY questions after you get home, please call the office [phone] and ask for Zach Tietje RN or send a MyChart message.  [facility] - Preparing For Surgery  (surgical scrub)  Before surgery, you can play an important role. Because skin is not sterile, your skin needs to be as free of germs as possible. You can reduce the number of germs on your skin by washing  with CHG (chlorahexidine gluconate) Soap before surgery.  CHG is an antiseptic cleaner which kills germs and bonds with the skin to continue killing germs even after washing.   Please do not use if you have an allergy to CHG or antibacterial soaps.  If your skin becomes reddened/irritated stop using the CHG.   Do not shave (including legs and underarms) for at least 48 hours prior to first CHG shower.  It is OK to shave your face.  Please follow these instructions carefully:  1.  Shower the night before surgery and the morning of surgery with CHG.  2.  If you choose to wash your hair, wash your hair first as usual with your normal shampoo.  3.  After you shampoo, rinse your hair and body thoroughly to remove the shampoo.  4.  Use CHG as you would any other liquid soap.  You can apply CHG directly to the skin and wash gently with a clean washcloth. 5.  Apply the CHG Soap to your body ONLY FROM THE NECK DOWN.  Do not use on open wounds or open sores.  Avoid contact with your eyes, ears, mouth and genitals (private parts).  Wash genitals (private parts) with your normal soap.  6.  Wash thoroughly, paying special attention to the area where your surgery will be performed.  7.  Thoroughly rinse your body with warm water from the neck down.   8.  DO NOT shower/wash with your normal soap after using and rinsing off the CHG soap.  9.  Pat yourself dry with a clean towel.           10.  Wear clean pajamas.           11.  Place clean sheets on your bed the night of your first shower and do not sleep with pets.  Day of Surgery: Do not apply any deodorants/lotions.  Please wear clean clothes to the hospital/surgery center.    Biventricular Pacemaker Implantation A biventricular pacemaker implantation is a procedure to place (implant) a pacemaker near the heart. A pacemaker is a small, battery-powered device that helps control the heartbeat. If the heart beats irregularly or too slowly (bradycardia), the  pacemaker will pace the heart so that it beats at a normal rate or a programmed rate. The parts of a biventricular pacemaker include:  The pulse generator. The pulse generator contains a small computer and a memory system that is programmed to keep the heart beating at a certain rate. The pulse generator also produces the electrical signal that triggers the heart to beat. This is implanted under the skin of the upper chest, near the collarbone.  Wires (leads). There may be two or three leads placed in the heart--one to the right atrium, one to the right ventricle, and one through the coronary sinus to reach the left  ventricle of the heart. The leads are connected to the pulse generator. They transmit electrical pulses from the pulse generator to the heart. A biventricular pacemaker is used in people with heart failure due to weak heart muscles. The pacemaker can help the heart chambers pump more efficiently. This procedure may be done to treat:  Symptoms of severe heart failure, such as shortness of breath (dyspnea).  Loss of consciousness that happens repeatedly (syncope) because of an irregular heart rate. Tell a health care provider about:  Any allergies you have.  All medicines you are taking, including vitamins, herbs, eye drops, creams, and over-the-counter medicines.  Any problems you or family members have had with anesthetic medicines.  Any blood disorders you have.  Any surgeries you have had.  Any medical conditions you have.  Whether you are pregnant or may be pregnant. What are the risks? Generally, this is a safe procedure. However, problems may occur, including:  Infection.  Swelling, bruising, or bleeding at the pacemaker site, especially if you take blood thinners.  Allergic reactions to medicines or dyes.  Damage to blood vessels or nerves near the pacemaker.  Failure of the pacemaker to improve your condition.  Collapsed lung.  Blood clots.  Lead failures.  This may require more surgery. What happens before the procedure? Medicines Ask your health care provider about:  Changing or stopping your regular medicines. This is especially important if you are taking diabetes medicines or blood thinners.  Taking medicines such as aspirin and ibuprofen. These medicines can thin your blood. Do not take these medicines unless your health care provider tells you to take them.  Taking over-the-counter medicines, vitamins, herbs, and supplements. Eating and drinking Follow instructions from your health care provider about eating and drinking, which may include:  8 hours before the procedure - stop eating heavy meals or foods, such as meat, fried foods, or fatty foods.  6 hours before the procedure - stop eating light meals or foods, such as toast or cereal.  6 hours before the procedure - stop drinking milk or drinks that contain milk.  2 hours before the procedure - stop drinking clear liquids. Staying hydrated Follow instructions from your health care provider about hydration, which may include:  Up to 2 hours before the procedure - you may continue to drink clear liquids, such as water, clear fruit juice, black coffee, and plain tea.  General instructions  Do not use any products that contain nicotine or tobacco for at least 4 weeks before the procedure. These products include cigarettes, e-cigarettes, and chewing tobacco. If you need help quitting, ask your health care provider.  You may have tests, including: ? Blood tests. ? Chest X-rays. ? Echocardiogram. This is a test that uses sound waves (ultrasound) to produce an image of the heart.  Ask your health care provider: ? How your surgery site will be marked. ? What steps will be taken to help prevent infection. These may include:  Removing hair at the surgery site.  Washing skin with a germ-killing soap.  Taking antibiotic medicine.  Plan to have someone take you home from the  hospital or clinic.  If you will be going home right after the procedure, plan to have someone with you for 24 hours. What happens during the procedure?   An IV will be inserted into one of your veins.  You will be connected to a heart monitor. Large electrode pads will be placed on the front and back of your chest.  You will be given one or more of the following: ? A medicine to help you relax (sedative). ? A medicine to make you fall asleep (general anesthetic). ? A medicine that is injected into an area of your body to numb the area (local anesthetic).  An incision will be made in your upper chest, near your heart.  The leads will be guided into your incision, through your blood vessels, and into your heart. Your health care provider will use an X-ray machine (fluoroscope) to guide the leads into your heart.  The leads will be attached to your heart muscles and to the pulse generator.  The leads will be tested to make sure that they work correctly.  The pulse generator will be implanted under your skin, near your incision.  The heart monitor will be watched to ensure that the pacer is working correctly.  Your incision will be closed with stitches (sutures), skin glue, or adhesive tape.  A bandage (dressing) will be placed over your incision. The procedure may vary among health care providers and hospitals. What happens after the procedure?  Your blood pressure, heart rate, breathing rate, and blood oxygen level will be monitored until you leave the hospital or clinic.  You may continue to receive fluids and medicines through an IV.  You will be given pain medicine as needed.  You will have a chest X-ray done. This is to make sure that your pacemaker is in the right place.  You will be given a pacemaker identification card. This card lists the implant date, device model, and manufacturer of your pacemaker.  Do not drive for 24 hours if you were given a sedative during  your procedure. Summary  A pacemaker is a small, battery-powered device that helps control the heartbeat.  A biventricular pacemaker is used in people with heart failure due to weak heart muscles.  Follow instructions from your health care provider about taking medicines and about eating and drinking before the procedure.  You will be given a pacemaker identification card that lists the implant date, device model, and manufacturer of your pacemaker. This information is not intended to replace advice given to you by your health care provider. Make sure you discuss any questions you have with your health care provider. Document Revised: 09/29/2018 Document Reviewed: 09/29/2018 Elsevier Patient Education  2020 [employer].

## 2020-05-01 NOTE — Progress Notes (Addendum)
Electrophysiology Office Note   Date:  05/01/2020   ID:  Randall Best, DOB 03/20/1951, MRN [id number]  PCP:  Lyman Bishop, DO  Cardiologist:  Ellyn Hack Primary Electrophysiologist:  Bertie Simien Meredith Leeds, MD    Chief Complaint:    History of Present Illness: Randall Best is a 69 y.o. male who is being seen today for the evaluation of AF, CHF at the request of [name]. Presenting today for electrophysiology evaluation.  He has a history of nonischemic cardiomyopathy, persistent atrial fibrillation, and a newly documented left bundle branch block.  He had a recent left heart catheterization that showed a well compensated cardiomyopathy with no evidence of coronary artery disease.  He was previously quite short of breath, but his Lasix and digoxin were increased which improved his symptoms.  He does not feel that he has been in atrial fibrillation, though he is persistent.  Today, denies symptoms of palpitations, chest pain, shortness of breath, orthopnea, PND, lower extremity edema, claudication, dizziness, presyncope, syncope, bleeding, or neurologic sequela. The patient is tolerating medications without difficulties.  He has been feeling weak and fatigued over the past few months.  He is being referred today for evaluation of CRT-P implant.  He has been having worsening lower extremity edema, though he has minimal edema today.  He has been taking high doses of Lasix, up to 120 mg twice a day at times.  He presented to urgent care a few weeks ago with red legs and swelling and was prescribed clindamycin for potentially cellulitis.   Past Medical History:  Diagnosis Date   Asthma    as child   Atrial fibrillation, rapid (Randall Best) 12/08/2017   Diagnosed during colonoscopy -presumably new onset   BBB (bundle branch block)    GERD (gastroesophageal reflux disease)    use meds prn   Glaucoma    Headache    remote h/o migraines, none in years   Hyperlipidemia    NICM  (nonischemic cardiomyopathy) (Randall Best) 04/2019   Echo 05/2019 - EF reduced to <20%.  Has permanent Afib & LBBB (normal Coronaries on Cath). -->  After cardioversion and titration medications, EF up to 35-40% with  global hypokinesis..   Thoracic aortic aneurysm (Randall Best) 10/20/2019   Ascending thoracic aortic estimated 44 mm on echo   TIA (transient ischemic attack) 01/13/2018   Ventral hernia    Past Surgical History:  Procedure Laterality Date   CARDIAC EVENT MONITOR  12/2016   showed persistent Afib -monitor return in early because of persistent A. fib.  Several episodes of severe RVR with rates in the 170s were noted.    CARDIOVERSION N/A 01/13/2018   Procedure: CARDIOVERSION;  Surgeon: Pixie Casino, MD;  Location: [facility] ENDOSCOPY;  Service: Cardiovascular;  Laterality: N/A; unsuccessful.-->  Post-cardioversion, the patient had left-sided facial numbness and weakness.  Ruled out for stroke.   CARDIOVERSION N/A 08/19/2019   Procedure: CARDIOVERSION;  Surgeon: Larey Dresser, MD;  Location: [facility] ENDOSCOPY;  Service: Cardiovascular;  Laterality: N/A;   INSERTION OF MESH N/A 07/22/2017   Procedure: INSERTION OF MESH;  Surgeon: [name], MD;  Location: [place];  Service: General;  Laterality: N/A;  BILATERAL TAP BLOCK   NM MYOVIEW LTD  12/2017   Shows reduced EF, but no evidence of ischemia or infarction.  EF 40 and 45%.  INTERMEDIATE RISK due to reduced function.  EF notably better on echo (50 and 55%)   RIGHT/LEFT HEART CATH AND CORONARY ANGIOGRAPHY N/A 07/14/2019  Procedure: RIGHT/LEFT HEART CATH AND CORONARY ANGIOGRAPHY;  Surgeon: [name], MD;  Location: [place] [facility];; Angiographically normal coronary arteries. RHC  - PCWP & LVEDP 17 mmHg.  CO-CI 4.92-2.34   TEE WITHOUT CARDIOVERSION N/A 01/13/2018   Procedure: TRANSESOPHAGEAL ECHOCARDIOGRAM (TEE) with cardioversion - ;  Surgeon: Pixie Casino, MD;  Location: [facility] ENDOSCOPY;  Service: Cardiovascular;  Laterality: N/A;  Mild  concentric LVH.  EF 55 to 60%.  No R WMA.  Mild ascending aortic dilation of 4.2 cm.  Dilated left atrium.  No thrombus.   TONSILLECTOMY     TRANSTHORACIC ECHOCARDIOGRAM  10/11/2019   EF up to 35-40%.-Moderate-severely decreased function.  "GR 1 DD ".  Normal atrial sizes.  Moderate ascending aorta-44 mL.  Relatively normal valves.   TRANSTHORACIC ECHOCARDIOGRAM  06/2019    Severely reduced function with diffuse hypokinesis and septal dyskinesis.  EF <20%.  Cannot exclude LV thrombus.  Reduced RV function with mildly elevated RV P--37 mmHg.  Moderate RA and mild LA dilation.  A centimeter dilation 42 mm   VENTRAL HERNIA REPAIR N/A 07/22/2017   Procedure: OPEN VENTRAL HERNIA REPAIR WITH MESH ERAS PATHWAY;  Surgeon: [name], MD;  Location: [place];  Service: General;  Laterality: N/A;  BILATERAL TAP BLOCK     Current Outpatient Medications  Medication Sig Dispense Refill   acetaminophen (TYLENOL) 325 MG tablet Take 650 mg by mouth every 6 (six) hours as needed.     amiodarone (PACERONE) 200 MG tablet Take 1 tablet (200 mg total) by mouth daily. 90 tablet 3   benzonatate (TESSALON PERLES) 100 MG capsule Take 1 capsule (100 mg total) by mouth every 6 (six) hours as needed for cough. 30 capsule 0   Cholecalciferol (VITAMIN D3) 5000 units TABS Take 5,000 Units by mouth daily.      clindamycin (CLEOCIN) 150 MG capsule Take 150 mg by mouth 3 (three) times daily.     digoxin (LANOXIN) 0.125 MG tablet TAKE 1 TABLET BY MOUTH DAILY 90 tablet 1   ENTRESTO 24-26 MG Take 2 tablets by mouth daily. Take one tablet in the morning.     ENTRESTO 49-51 MG TAKE 1 TABLET BY MOUTH TWICE A DAY 180 tablet 2   furosemide (LASIX) 20 MG tablet Take 40 mg by mouth. Take 3 (120 mg) tablets by mouth in the morning. May take an addition 1 tablet (40 mg) in the afternoon if needed     hydrocortisone (ANUSOL-HC) 25 MG suppository Place 1 suppository (25 mg total) rectally at bedtime. 24 suppository 3    metolazone (ZAROXOLYN) 2.5 MG tablet Take 1 tablet (2.5 mg total) by mouth as needed. 30 minutes prior to your furosemide (lasix) dose 45 tablet 3   nitroGLYCERIN (NITROSTAT) 0.4 MG SL tablet Place 1 tablet (0.4 mg total) under the tongue every 5 (five) minutes as needed for chest pain. 25 tablet 3   omeprazole (PRILOSEC) 20 MG capsule Take 1 capsule (20 mg total) by mouth daily. 90 capsule 3   potassium chloride SA (KLOR-CON M20) 20 MEQ tablet Take 20 mEq by mouth as needed. dehydration     rosuvastatin (CRESTOR) 5 MG tablet Take 1 tablet (5 mg total) by mouth daily. 90 tablet 3   Specialty Vitamins Products (ULTRA MAN PO) Take 1 tablet by mouth daily.      XARELTO 20 MG TABS tablet TAKE 1 TABLET (20 MG TOTAL) BY MOUTH DAILY WITH SUPPER. 90 tablet 1   spironolactone (ALDACTONE) 25 MG tablet  Take 1 tablet (25 mg total) by mouth daily. 90 tablet 3   No current facility-administered medications for this visit.    Allergies:   Prilosec [omeprazole], Statins, and Zetia [ezetimibe]   Social History:  The patient  reports that he has never smoked. He has never used smokeless tobacco. He reports current alcohol use. He reports that he does not use drugs.   Family History:  The patient's family history includes CVA in his maternal grandmother; Diabetes in his mother; Heart failure in his father; Hypertension in his brother.    ROS:  Please see the history of present illness.   Otherwise, review of systems is positive for none.   All other systems are reviewed and negative.   PHYSICAL EXAM: VS:  BP 120/78    Pulse 68    Ht 5\' 9"  (1.753 m)    Wt 226 lb (102.5 kg)    SpO2 98%    BMI 33.37 kg/m  , BMI Body mass index is 33.37 kg/m. GEN: Well nourished, well developed, in no acute distress  HEENT: normal  Neck: no JVD, carotid bruits, or masses Cardiac: RRR; no murmurs, rubs, or gallops,no edema  Respiratory:  clear to auscultation bilaterally, normal work of breathing GI: soft, nontender,  nondistended, + BS MS: no deformity or atrophy  Skin: warm and dry Neuro:  Strength and sensation are intact Psych: euthymic mood, full affect  EKG:  EKG is ordered today. Personal review of the ekg ordered shows sinus rhythm, LBBB    Recent Labs: 02/28/2020: ALT 30; BUN 20; Creatinine, Ser 1.33; Hemoglobin 14.7; Platelets 211; Potassium 4.6; Sodium 140; TSH 1.580    Lipid Panel     Component Value Date/Time   CHOL 232 (H) 02/28/2020 1204   TRIG 200 (H) 02/28/2020 1204   HDL 55 02/28/2020 1204   CHOLHDL 4.2 02/28/2020 1204   CHOLHDL 4.8 01/14/2018 0631   VLDL 19 01/14/2018 0631   LDLCALC 141 (H) 02/28/2020 1204     Wt Readings from Last 3 Encounters:  05/01/20 226 lb (102.5 kg)  02/28/20 231 lb (104.8 kg)  02/09/20 229 lb 8 oz (104.1 kg)      Other studies Reviewed: Additional studies/ records that were reviewed today include: TTE 10/11/19  Review of the above records today demonstrates:   1. Left ventricular ejection fraction, by visual estimation, is 35 to 40%. The left ventricle has moderate to severely decreased function. There is no left ventricular hypertrophy.  2. Left ventricular diastolic parameters are consistent with Grade I diastolic dysfunction (impaired relaxation).  3. Global right ventricle has normal systolic function.The right ventricular size is normal. No increase in right ventricular wall thickness.  4. Left atrial size was normal.  5. Right atrial size was normal.  6. The mitral valve is normal in structure. Mild mitral valve regurgitation. No evidence of mitral stenosis.  7. The tricuspid valve is normal in structure. Tricuspid valve regurgitation is trivial.  8. The aortic valve is tricuspid. Aortic valve regurgitation is not visualized. No evidence of aortic valve sclerosis or stenosis.  9. There is Mild thickening of the aortic valve. 10. The pulmonic valve was normal in structure. Pulmonic valve regurgitation is not visualized. 11. Aortic  dilatation noted. 12. There is moderate dilatation of the ascending aorta measuring 44 mm. 13. The atrial septum is grossly normal.  RHC/LHC 07/14/19  Angiographically normal coronary arteries  ---------------------  There is severe left ventricular systolic dysfunction. The left ventricular ejection fraction is less  than 25% by visual estimate by visual estimation  Relatively compensated right heart cath pressures with PCWP and LVEDP of 17 mmHg.  Cardiac Output-Index (Fick) 4.92-2.34  ASSESSMENT AND PLAN:  1.  Persistent atrial fibrillation: Currently on Xarelto.  Has started amiodarone and has been in sinus rhythm.  He was previously in atrial fibrillation for quite a few years.  CHA2DS2-VASc of 4.  LFTs and TSH reviewed for monitoring of amiodarone, a high risk medication.  2.  Nonischemic cardiomyopathy: Left heart cath without evidence of coronary disease.  Currently on entresto, Aldactone, digoxin.  Blood pressure did not tolerate ACE inhibitor or ARB's.  Shortness of breath improved by maintenance of sinus rhythm.  His ejection fraction has improved and thus ICD is not indicated, but with his wide left bundle branch block and continued moderate LV systolic dysfunction, he would likely benefit from a cardiac resynchronization.  Zriyah Kopplin plan for CRT-P.  Risks and benefits were discussed include bleeding, tamponade, infection, pneumothorax.  He understands these risks and is agreed to the procedure.  He is also been taking high doses of Lasix lately, we Kruz Chiu check a basic metabolic today.  Case discussed with primary cardiology  Current medicines are reviewed at length with the patient today.   The patient does not have concerns regarding his medicines.  The following changes were made today: None  Labs/ tests ordered today include:  Orders Placed This Encounter  Procedures   Basic metabolic panel   EKG 15-VVOH     Disposition:   FU with Addilynn Mowrer 3 months  Signed, Deshanda Molitor  Meredith Leeds, MD  05/01/2020 10:36 AM     [facility] [address] [place] [address] [phone] (office) [phone] (fax)

## 2020-05-22 ENCOUNTER — Telehealth: Payer: Self-pay | Admitting: Cardiology

## 2020-05-22 DIAGNOSIS — I428 Other cardiomyopathies: Secondary | ICD-10-CM

## 2020-05-22 DIAGNOSIS — Z01812 Encounter for preprocedural laboratory examination: Secondary | ICD-10-CM

## 2020-05-22 NOTE — Telephone Encounter (Signed)
Informed pt that I had not sent message to billing for precert yet, and apologized to pt. Explained that I would send right now. Informed that they could call insurance Friday and they should be able to answer about coverage then. CPT codes given so they can follow up w/ insurance at the end of the week. Pt appreciative of my help with this.

## 2020-05-22 NOTE — Telephone Encounter (Signed)
New Message   Patient is calling because his insurance company is requesting a prior authorization for him to have the pacemaker installed. Please call to discuss.

## 2020-05-23 NOTE — Telephone Encounter (Signed)
Pt aware he will need pre procedure lab work prior to procedure.  Pt agreeable to stop by the office next Tuesday, 7/20, for labs.

## 2020-05-23 NOTE — Addendum Note (Signed)
Addended by: Stanton Kidney on: 05/23/2020 03:40 PM   Modules accepted: Orders

## 2020-05-28 ENCOUNTER — Telehealth: Payer: Self-pay | Admitting: *Deleted

## 2020-05-28 NOTE — Telephone Encounter (Signed)
Spoke to pt's wife, dpr on file. Informed of procedure scheduling change.  Agreeable to moving CRT-P implant to Friday (from Thursday).  Advised to arrive to [facility] at 7:30 am on Friday for pacemaker implant and that all other instructions remain the same. Wfie verbalized understanding, will pass information along to pt, and agreeable to plan.

## 2020-05-29 ENCOUNTER — Other Ambulatory Visit: Payer: Self-pay

## 2020-05-29 ENCOUNTER — Other Ambulatory Visit: Payer: 59

## 2020-05-29 DIAGNOSIS — Z01812 Encounter for preprocedural laboratory examination: Secondary | ICD-10-CM

## 2020-05-29 DIAGNOSIS — I428 Other cardiomyopathies: Secondary | ICD-10-CM

## 2020-05-29 LAB — CBC
Hematocrit: 39.5 % (ref 37.5–51.0)
Hemoglobin: 12.8 g/dL — ABNORMAL LOW (ref 13.0–17.7)
MCH: 30 pg (ref 26.6–33.0)
MCHC: 32.4 g/dL (ref 31.5–35.7)
MCV: 93 fL (ref 79–97)
Platelets: 179 10*3/uL (ref 150–450)
RBC: 4.26 x10E6/uL (ref 4.14–5.80)
RDW: 14.1 % (ref 11.6–15.4)
WBC: 6.8 10*3/uL (ref 3.4–10.8)

## 2020-05-29 LAB — BASIC METABOLIC PANEL
BUN/Creatinine Ratio: 18 (ref 10–24)
BUN: 20 mg/dL (ref 8–27)
CO2: 28 mmol/L (ref 20–29)
Calcium: 9 mg/dL (ref 8.6–10.2)
Chloride: 103 mmol/L (ref 96–106)
Creatinine, Ser: 1.1 mg/dL (ref 0.76–1.27)
GFR calc Af Amer: 79 mL/min/{1.73_m2} (ref >59)
GFR calc non Af Amer: 69 mL/min/{1.73_m2} (ref >59)
Glucose: 107 mg/dL — ABNORMAL HIGH (ref 65–99)
Potassium: 4.3 mmol/L (ref 3.5–5.2)
Sodium: 140 mmol/L (ref 134–144)

## 2020-05-31 ENCOUNTER — Telehealth: Payer: Self-pay | Admitting: *Deleted

## 2020-05-31 NOTE — Telephone Encounter (Signed)
Informed wife procedure for tomorrow cancelled. Explained that 5pm scheduled peer to peer was cancelled d/t Dr. Curt Bears in a procedure in the hospital and could not make meeting.  Aware that I will call insurance tomorrow and arrange another meeting. Aware we call once this has occurred to hopefully reschedule procedure. She is agreeable to plan.  (spoke to her this morning informing her that insurance denied approval for procedure and requested peer to peer.  Peer to peer was scheduled for 5pm)

## 2020-06-01 ENCOUNTER — Ambulatory Visit (HOSPITAL_COMMUNITY): Admission: RE | Admit: 2020-06-01 | Payer: 59 | Source: Home / Self Care | Admitting: Cardiology

## 2020-06-01 ENCOUNTER — Encounter (HOSPITAL_COMMUNITY): Admission: RE | Payer: 59 | Source: Home / Self Care

## 2020-06-01 SURGERY — BIV PACEMAKER INSERTION CRT-P

## 2020-06-12 ENCOUNTER — Ambulatory Visit: Payer: 59

## 2020-06-21 ENCOUNTER — Other Ambulatory Visit: Payer: Self-pay | Admitting: Cardiology

## 2020-07-06 ENCOUNTER — Other Ambulatory Visit: Payer: Self-pay | Admitting: Cardiology

## 2020-07-20 ENCOUNTER — Telehealth: Payer: Self-pay | Admitting: *Deleted

## 2020-07-20 NOTE — Telephone Encounter (Signed)
Apologized for not getting with pt this wee to review instructions. Aware I will call next week to go over 9/22 procedure instructions. Patient verbalized understanding and agreeable to plan.

## 2020-07-23 ENCOUNTER — Telehealth: Payer: Self-pay | Admitting: Cardiology

## 2020-07-23 NOTE — Telephone Encounter (Signed)
*  STAT* If patient is at the pharmacy, call can be transferred to refill team.   1. Which medications need to be refilled? (please list name of each medication and dose if known) Benzonatate  2. Which pharmacy/location (including street and city if local pharmacy) is medication to be sent to? [facility]   3. Do they need a 30 day or 90 day supply? 30 would like 90

## 2020-07-24 NOTE — Telephone Encounter (Signed)
Called and spoke with pt. He reports a hacking cough and this occurs when he has fluid buildup around his heart. He reports that he has told Dr.Harding about this before and that the benzonatate helps. He reports that he originally got the prescription back in October of 2000 and only takes it when he needs it. He reports he does not have a virus. He has had 3 pfizer shots, and his flu shot. Denies fever. He does report some SOB from the fluid build up and that he has to sleep straight up at night. He states he has some chest pain 3-4 days ago but it did not last. He is supposed to have a pacemaker placed next week with Dr.Martin.  He states he took a fluid pill last night and this helped some but he still has this "smothering" feeling. He endorses some swelling with his R foot but not the left and a "little" weight gain- 2lbs in a day. He currently weights 233.7lbs.   Notified I would send this message to Dr.Harding to review and advise on the SOB, swelling, and refilling his Benzonatate.  Pt verbalized understanding with no other questions at this time.

## 2020-07-25 NOTE — Telephone Encounter (Signed)
Difficult to know what exactly he is talking about.  Agree with taking additional "fluid pill" for a couple days. --> maybe we can add him on as a Virtual encounter for this Friday. [name], MD

## 2020-07-26 NOTE — Telephone Encounter (Signed)
Spoke to wife, reviewed procedure instructions. Pt will stop by the office Monday for labs and then go for Covid screening. Aware to arrive to [facility] on procedure day at 11:00 am, NPO after MN the night before, hold all medications morning of procedure. Aware office will call to arrange post procedure follow up. Wife verbalized understanding and agreeable to plan, she will inform pt of instructions.

## 2020-07-30 ENCOUNTER — Other Ambulatory Visit: Payer: Self-pay

## 2020-07-30 ENCOUNTER — Other Ambulatory Visit: Payer: 59 | Admitting: *Deleted

## 2020-07-30 ENCOUNTER — Other Ambulatory Visit (HOSPITAL_COMMUNITY)
Admission: RE | Admit: 2020-07-30 | Discharge: 2020-07-30 | Disposition: A | Discharge status: Home / Self Care | Payer: 59

## 2020-07-30 ENCOUNTER — Other Ambulatory Visit (HOSPITAL_COMMUNITY)
Admission: RE | Admit: 2020-07-30 | Discharge: 2020-07-30 | Disposition: A | Discharge status: Home / Self Care | Payer: 59 | Source: Ambulatory Visit | Attending: Cardiology | Admitting: Cardiology

## 2020-07-30 ENCOUNTER — Telehealth: Payer: Self-pay | Admitting: Cardiology

## 2020-07-30 DIAGNOSIS — Z20822 Contact with and (suspected) exposure to covid-19: Secondary | ICD-10-CM | POA: Insufficient documentation

## 2020-07-30 DIAGNOSIS — I4819 Other persistent atrial fibrillation: Secondary | ICD-10-CM

## 2020-07-30 DIAGNOSIS — Z01812 Encounter for preprocedural laboratory examination: Secondary | ICD-10-CM

## 2020-07-30 LAB — BASIC METABOLIC PANEL
BUN/Creatinine Ratio: 20 (ref 10–24)
BUN: 22 mg/dL (ref 8–27)
CO2: 28 mmol/L (ref 20–29)
Calcium: 9.6 mg/dL (ref 8.6–10.2)
Chloride: 102 mmol/L (ref 96–106)
Creatinine, Ser: 1.1 mg/dL (ref 0.76–1.27)
GFR calc Af Amer: 79 mL/min/{1.73_m2} (ref >59)
GFR calc non Af Amer: 69 mL/min/{1.73_m2} (ref >59)
Glucose: 106 mg/dL — ABNORMAL HIGH (ref 65–99)
Potassium: 4.3 mmol/L (ref 3.5–5.2)
Sodium: 140 mmol/L (ref 134–144)

## 2020-07-30 LAB — CBC
Hematocrit: 38.6 % (ref 37.5–51.0)
Hemoglobin: 12.4 g/dL — ABNORMAL LOW (ref 13.0–17.7)
MCH: 29.9 pg (ref 26.6–33.0)
MCHC: 32.1 g/dL (ref 31.5–35.7)
MCV: 93 fL (ref 79–97)
Platelets: 168 10*3/uL (ref 150–450)
RBC: 4.15 x10E6/uL (ref 4.14–5.80)
RDW: 13.9 % (ref 11.6–15.4)
WBC: 8.3 10*3/uL (ref 3.4–10.8)

## 2020-07-30 LAB — SARS CORONAVIRUS 2 (TAT 6-24 HRS): SARS Coronavirus 2: NEGATIVE

## 2020-07-30 NOTE — Telephone Encounter (Signed)
Spoke to wife.  Apologized for any confusion as she informs me that pt is "being run around" and our office told pt to go to hospital, and then hospital tells him to come to office.  Informed wife that pt should return to office for blood work.   As I was at front desk ensuring this was handled correctly going forward, pt walks in for labs and ready to cancel procedure.  Discussed issue w/ pt, apologized for the misunderstanding.  He was upset at first but after discussion pt more understanding and appreciates our help with things.  He would like to proceed with planned ablation Wednesday.

## 2020-07-30 NOTE — Telephone Encounter (Signed)
Patient's wife called and stated that they have no request to receive bloodwork from Dr. Curt Bears office. Patient is supposed to have procedure on 08/01/20. Wife is visibly frustrated with the process and wants something done quickly. Please call

## 2020-07-31 MED ORDER — ENTRESTO 24-26 MG PO TABS: 1.0000 | ORAL_TABLET | ORAL | 5 refills | Status: DC

## 2020-07-31 NOTE — Progress Notes (Signed)
Attempted to call patient regarding procedure instructions for tomorrow.  Left voice mail arrival time to hospital tomorrow is 11:00, nothing to eat or drink after midnight, do not take any medications in the morning.  You will need a responsible adult to drive you home tomorrow and stay overnight with you.

## 2020-08-01 ENCOUNTER — Ambulatory Visit (HOSPITAL_COMMUNITY)
Admission: RE | Disposition: A | Discharge status: Home / Self Care | Payer: Self-pay | Source: Home / Self Care | Attending: Cardiology

## 2020-08-01 ENCOUNTER — Ambulatory Visit (HOSPITAL_COMMUNITY)
Admission: RE | Admit: 2020-08-01 | Discharge: 2020-08-01 | Disposition: A | Discharge status: Home / Self Care | Payer: 59 | Attending: Cardiology | Admitting: Cardiology

## 2020-08-01 ENCOUNTER — Other Ambulatory Visit: Payer: Self-pay

## 2020-08-01 ENCOUNTER — Other Ambulatory Visit: Payer: Self-pay | Admitting: Cardiology

## 2020-08-01 ENCOUNTER — Ambulatory Visit (HOSPITAL_COMMUNITY): Payer: 59

## 2020-08-01 DIAGNOSIS — I447 Left bundle-branch block, unspecified: Secondary | ICD-10-CM | POA: Diagnosis not present

## 2020-08-01 DIAGNOSIS — I509 Heart failure, unspecified: Secondary | ICD-10-CM | POA: Diagnosis not present

## 2020-08-01 DIAGNOSIS — I5022 Chronic systolic (congestive) heart failure: Secondary | ICD-10-CM

## 2020-08-01 DIAGNOSIS — Z95818 Presence of other cardiac implants and grafts: Secondary | ICD-10-CM

## 2020-08-01 DIAGNOSIS — I428 Other cardiomyopathies: Secondary | ICD-10-CM | POA: Insufficient documentation

## 2020-08-01 HISTORY — PX: BIV PACEMAKER INSERTION CRT-P: EP1199

## 2020-08-01 SURGERY — BIV PACEMAKER INSERTION CRT-P

## 2020-08-01 MED ORDER — CEFAZOLIN SODIUM-DEXTROSE 2-4 GM/100ML-% IV SOLN
2.0000 g | INTRAVENOUS | Status: AC
  Administered 2020-08-01: 2 g via INTRAVENOUS
  Filled 2020-08-01: qty 100

## 2020-08-01 MED ORDER — ONDANSETRON HCL 4 MG/2ML IJ SOLN: 4.0000 mg | Freq: Four times a day (QID) | INTRAMUSCULAR | Status: DC | PRN

## 2020-08-01 MED ORDER — SODIUM CHLORIDE 0.9 % IV SOLN
INTRAVENOUS | Status: AC
  Filled 2020-08-01: qty 2

## 2020-08-01 MED ORDER — SODIUM CHLORIDE 0.9 % IV SOLN: INTRAVENOUS | Status: DC

## 2020-08-01 MED ORDER — LIDOCAINE HCL (PF) 1 % IJ SOLN
INTRAMUSCULAR | Status: DC | PRN
  Administered 2020-08-01: 60 mL

## 2020-08-01 MED ORDER — LIDOCAINE HCL 1 % IJ SOLN
INTRAMUSCULAR | Status: AC
  Filled 2020-08-01: qty 60

## 2020-08-01 MED ORDER — MIDAZOLAM HCL 5 MG/5ML IJ SOLN
INTRAMUSCULAR | Status: AC
  Filled 2020-08-01: qty 5

## 2020-08-01 MED ORDER — FENTANYL CITRATE (PF) 100 MCG/2ML IJ SOLN
INTRAMUSCULAR | Status: AC
  Filled 2020-08-01: qty 2

## 2020-08-01 MED ORDER — CEFAZOLIN SODIUM-DEXTROSE 1-4 GM/50ML-% IV SOLN: 1.0000 g | Freq: Four times a day (QID) | INTRAVENOUS | Status: DC

## 2020-08-01 MED ORDER — HEPARIN (PORCINE) IN NACL 1000-0.9 UT/500ML-% IV SOLN
INTRAVENOUS | Status: DC | PRN
  Administered 2020-08-01: 500 mL

## 2020-08-01 MED ORDER — MIDAZOLAM HCL 5 MG/5ML IJ SOLN
INTRAMUSCULAR | Status: DC | PRN
  Administered 2020-08-01 (×2): 1 mg via INTRAVENOUS

## 2020-08-01 MED ORDER — CHLORHEXIDINE GLUCONATE 4 % EX LIQD
4.0000 "application " | Freq: Once | CUTANEOUS | Status: DC
  Filled 2020-08-01: qty 60

## 2020-08-01 MED ORDER — SODIUM CHLORIDE 0.9 % IV SOLN
INTRAVENOUS | Status: DC | PRN
  Administered 2020-08-01: 500 mL

## 2020-08-01 MED ORDER — IOHEXOL 350 MG/ML SOLN
INTRAVENOUS | Status: DC | PRN
  Administered 2020-08-01: 10 mL

## 2020-08-01 MED ORDER — ACETAMINOPHEN 325 MG PO TABS
325.0000 mg | ORAL_TABLET | ORAL | Status: DC | PRN
  Administered 2020-08-01: 650 mg via ORAL
  Filled 2020-08-01: qty 2

## 2020-08-01 MED ORDER — CEFAZOLIN SODIUM-DEXTROSE 2-4 GM/100ML-% IV SOLN
INTRAVENOUS | Status: AC
  Filled 2020-08-01: qty 100

## 2020-08-01 MED ORDER — FENTANYL CITRATE (PF) 100 MCG/2ML IJ SOLN
INTRAMUSCULAR | Status: DC | PRN
Start: 2020-08-01 — End: 2020-08-01
  Administered 2020-08-01 (×2): 25 ug via INTRAVENOUS

## 2020-08-01 MED ORDER — HEPARIN (PORCINE) IN NACL 1000-0.9 UT/500ML-% IV SOLN
INTRAVENOUS | Status: AC
  Filled 2020-08-01: qty 500

## 2020-08-01 MED ORDER — SODIUM CHLORIDE 0.9 % IV SOLN
80.0000 mg | INTRAVENOUS | Status: AC
  Administered 2020-08-01: 80 mg
  Filled 2020-08-01: qty 2

## 2020-08-01 SURGICAL SUPPLY — 24 items
BALLN ATTAIN 80 (BALLOONS) ×2
BALLOON ATTAIN 80 (BALLOONS) ×1 IMPLANT
CABLE SURGICAL S-101-97-12 (CABLE) ×2 IMPLANT
CATH ATTAIN COM SURV 6250V-MB2 (CATHETERS) ×4 IMPLANT
CATH ATTAIN COM SURV 6250V-MP (CATHETERS) ×2 IMPLANT
CATH CPS DIRECT 135 DS2C020 (CATHETERS) ×2 IMPLANT
CATH CPS QUART CN DS2N029-65 (CATHETERS) ×2 IMPLANT
CATH JOSEPH QUAD ALLRED 6F REP (CATHETERS) ×2 IMPLANT
KIT ESSENTIALS PG (KITS) ×2 IMPLANT
LEAD CAPSURE NOVUS 5076-52CM (Lead) ×2 IMPLANT
LEAD CAPSURE NOVUS 5076-58CM (Lead) ×2 IMPLANT
MAT PREVALON FULL STRYKER (MISCELLANEOUS) ×2 IMPLANT
PACEMAKER PRCT MRI CRTP W1TR01 (Pacemaker) ×1 IMPLANT
PAD PRO RADIOLUCENT 2001M-C (PAD) ×2 IMPLANT
PPM PRECEPTA MRI CRT-P W1TR01 (Pacemaker) ×2 IMPLANT
SHEATH 7FR PRELUDE SNAP 13 (SHEATH) ×4 IMPLANT
SHEATH 9.5FR PRELUDE SNAP 13 (SHEATH) ×2 IMPLANT
SHEATH PROBE COVER 6X72 (BAG) ×2 IMPLANT
SLITTER 6232ADJ (MISCELLANEOUS) ×2 IMPLANT
TRAY PACEMAKER INSERTION (PACKS) ×2 IMPLANT
WIRE ACUITY WHISPER EDS 4648 (WIRE) ×6 IMPLANT
WIRE ASAHI SION 190X3X12 .014 (WIRE) ×2 IMPLANT
WIRE HI TORQ VERSACORE-J 145CM (WIRE) ×2 IMPLANT
WIRE MAILMAN 182CM (WIRE) ×2 IMPLANT

## 2020-08-01 NOTE — H&P (Signed)
Electrophysiology Office Note   Date:  08/01/2020   ID:  Randall Best, DOB [birth year], MRN [id number]  PCP:  Randall Bishop, Randall Best  Cardiologist:  Randall Best Primary Electrophysiologist:  Randall Miyamoto Randall Leeds, Randall Best    Chief Complaint:    History of Present Illness: Randall Best is a 69 y.o. male who is being seen today for the evaluation of AF, CHF at the request of Randall Best. Presenting today for electrophysiology evaluation.  He has a history of nonischemic cardiomyopathy, persistent atrial fibrillation, and a newly documented left bundle branch block.  He had a recent left heart catheterization that showed a well compensated cardiomyopathy with no evidence of coronary artery disease.  He was previously quite short of breath, but his Lasix and digoxin were increased which improved his symptoms.  He does not feel that he has been in atrial fibrillation, though he is persistent.  Today, denies symptoms of palpitations, chest pain, shortness of breath, orthopnea, PND, lower extremity edema, claudication, dizziness, presyncope, syncope, bleeding, or neurologic sequela. The patient is tolerating medications without difficulties. Plan for CRT-P implant today./    Past Medical History:  Diagnosis Date  . Asthma    as child  . Atrial fibrillation, rapid ([place]) 12/08/2017   Diagnosed during colonoscopy -presumably new onset  . BBB (bundle branch block)   . GERD (gastroesophageal reflux disease)    use meds prn  . Glaucoma   . Headache    remote h/o migraines, none in years  . Hyperlipidemia   . NICM (nonischemic cardiomyopathy) ([place]) 04/2019   Echo 05/2019 - EF reduced to <20%.  Has permanent Afib & LBBB (normal Coronaries on Cath). -->  After cardioversion and titration medications, EF up to 35-40% with  global hypokinesis..  . Thoracic aortic aneurysm ([place]) 10/20/2019   Ascending thoracic aortic estimated 44 mm on echo  . TIA (transient ischemic attack) 01/13/2018  . Ventral  hernia    Past Surgical History:  Procedure Laterality Date  . CARDIAC EVENT MONITOR  12/2016   showed persistent Afib -monitor return in early because of persistent A. fib.  Several episodes of severe RVR with rates in the 170s were noted.   [name] CARDIOVERSION N/A 01/13/2018   Procedure: CARDIOVERSION;  Surgeon: Randall Casino, Randall Best;  Location: [facility] ENDOSCOPY;  Service: Cardiovascular;  Laterality: N/A; unsuccessful.-->  Post-cardioversion, the patient had left-sided facial numbness and weakness.  Ruled out for stroke.  [name] CARDIOVERSION N/A 08/19/2019   Procedure: CARDIOVERSION;  Surgeon: Randall Dresser, Randall Best;  Location: [facility] ENDOSCOPY;  Service: Cardiovascular;  Laterality: N/A;  . INSERTION OF MESH N/A 07/22/2017   Procedure: INSERTION OF MESH;  Surgeon: Randall Bookbinder, Randall Best;  Location: [facility];  Service: General;  Laterality: N/A;  BILATERAL TAP BLOCK  . NM MYOVIEW LTD  12/2017   Shows reduced EF, but no evidence of ischemia or infarction.  EF 40 and 45%.  INTERMEDIATE RISK due to reduced function.  EF notably better on echo (50 and 55%)  . RIGHT/LEFT HEART CATH AND CORONARY ANGIOGRAPHY N/A 07/14/2019   Procedure: RIGHT/LEFT HEART CATH AND CORONARY ANGIOGRAPHY;  Surgeon: Randall Man, Randall Best;  Location: [facility] CV LAB;; Angiographically normal coronary arteries. RHC  - PCWP & LVEDP 17 mmHg.  CO-CI 4.92-2.34  . TEE WITHOUT CARDIOVERSION N/A 01/13/2018   Procedure: TRANSESOPHAGEAL ECHOCARDIOGRAM (TEE) with cardioversion - ;  Surgeon: Randall Casino, Randall Best;  Location: [facility] ENDOSCOPY;  Service: Cardiovascular;  Laterality: N/A;  Mild concentric LVH.  EF  55 to 60%.  No R WMA.  Mild ascending aortic dilation of 4.2 cm.  Dilated left atrium.  No thrombus.  . TONSILLECTOMY    . TRANSTHORACIC ECHOCARDIOGRAM  10/11/2019   EF up to 35-40%.-Moderate-severely decreased function.  "GR 1 DD ".  Normal atrial sizes.  Moderate ascending aorta-44 mL.  Relatively normal valves.  . TRANSTHORACIC ECHOCARDIOGRAM  06/2019     Severely reduced function with diffuse hypokinesis and septal dyskinesis.  EF <20%.  Cannot exclude LV thrombus.  Reduced RV function with mildly elevated RV P--37 mmHg.  Moderate RA and mild LA dilation.  A centimeter dilation 42 mm  . VENTRAL HERNIA REPAIR N/A 07/22/2017   Procedure: OPEN VENTRAL HERNIA REPAIR WITH MESH ERAS PATHWAY;  Surgeon: Randall Bookbinder, Randall Best;  Location: [facility];  Service: General;  Laterality: N/A;  BILATERAL TAP BLOCK     Current Facility-Administered Medications  Medication Dose Route Frequency Provider Last Rate Last Admin  . 0.9 %  sodium chloride infusion   Intravenous Continuous Randall Haw, Randall Best 50 mL/hr at 08/01/20 1120 New Bag at 08/01/20 1120  . ceFAZolin (ANCEF) IVPB 2g/100 mL premix  2 g Intravenous On Call Randall Paradise Hassell Done, Randall Best      . chlorhexidine (HIBICLENS) 4 % liquid 4 application  4 application Topical Once Randall Baeten Hassell Done, Randall Best      . gentamicin (GARAMYCIN) 80 mg in sodium chloride 0.9 % 500 mL irrigation  80 mg Irrigation On Call Randall Best, Randall Doyne, Randall Best        Allergies:   Prilosec [omeprazole], Statins, Zetia [ezetimibe], Iodine, and Latex   Social History:  The patient  reports that he has never smoked. He has never used smokeless tobacco. He reports current alcohol use. He reports that he does not use drugs.   Family History:  The patient's family history includes CVA in his maternal grandmother; Diabetes in his mother; Heart failure in his father; Hypertension in his brother.   ROS:  Please see the history of present illness.   Otherwise, review of systems is positive for none.   All other systems are reviewed and negative.   PHYSICAL EXAM: VS:  BP (!) 145/85   Pulse (!) 53   Temp 98.7 F (37.1 C) (Oral)   Resp 18   Ht 5\' 9"  (1.753 m)   Wt 104.3 kg   SpO2 98%   BMI 33.97 kg/m  , BMI Body mass index is 33.97 kg/m. GEN: Well nourished, well developed, in no acute distress  HEENT: normal  Neck: no JVD, carotid bruits, or  masses Cardiac: RRR; no murmurs, rubs, or gallops,no edema  Respiratory:  clear to auscultation bilaterally, normal work of breathing GI: soft, nontender, nondistended, + BS MS: no deformity or atrophy  Skin: warm and dry Neuro:  Strength and sensation are intact Psych: euthymic mood, full affect   Recent Labs: 02/28/2020: ALT 30; TSH 1.580 07/30/2020: BUN 22; Creatinine, Ser 1.10; Hemoglobin 12.4; Platelets 168; Potassium 4.3; Sodium 140    Lipid Panel     Component Value Date/Time   CHOL 232 (H) 02/28/2020 1204   TRIG 200 (H) 02/28/2020 1204   HDL 55 02/28/2020 1204   CHOLHDL 4.2 02/28/2020 1204   CHOLHDL 4.8 01/14/2018 0631   VLDL 19 01/14/2018 0631   LDLCALC 141 (H) 02/28/2020 1204     Wt Readings from Last 3 Encounters:  08/01/20 104.3 kg  05/01/20 102.5 kg  02/28/20 104.8 kg      Other studies Reviewed:  Additional studies/ records that were reviewed today include: TTE 10/11/19  Review of the above records today demonstrates:   1. Left ventricular ejection fraction, by visual estimation, is 35 to 40%. The left ventricle has moderate to severely decreased function. There is no left ventricular hypertrophy.  2. Left ventricular diastolic parameters are consistent with Grade I diastolic dysfunction (impaired relaxation).  3. Global right ventricle has normal systolic function.The right ventricular size is normal. No increase in right ventricular wall thickness.  4. Left atrial size was normal.  5. Right atrial size was normal.  6. The mitral valve is normal in structure. Mild mitral valve regurgitation. No evidence of mitral stenosis.  7. The tricuspid valve is normal in structure. Tricuspid valve regurgitation is trivial.  8. The aortic valve is tricuspid. Aortic valve regurgitation is not visualized. No evidence of aortic valve sclerosis or stenosis.  9. There is Mild thickening of the aortic valve. 10. The pulmonic valve was normal in structure. Pulmonic valve  regurgitation is not visualized. 11. Aortic dilatation noted. 12. There is moderate dilatation of the ascending aorta measuring 44 mm. 13. The atrial septum is grossly normal.  RHC/LHC 07/14/19  Angiographically normal coronary arteries  ---------------------  There is severe left ventricular systolic dysfunction. The left ventricular ejection fraction is less than 25% by visual estimate by visual estimation  Relatively compensated right heart cath pressures with PCWP and LVEDP of 17 mmHg.  Cardiac Output-Index ([place]) 4.92-2.34  ASSESSMENT AND PLAN:  1.  Persistent atrial fibrillation: Currently on Xarelto.  Has started amiodarone and has been in sinus rhythm.  He was previously in atrial fibrillation for quite a few years.  CHA2DS2-VASc of 4.  LFTs and TSH reviewed for monitoring of amiodarone, a high risk medication.  2.  Nonischemic cardiomyopathy:  AUTREY HUMAN has presented today for surgery, with the diagnosis of CHF.  The various methods of treatment have been discussed with the patient and family. After consideration of risks, benefits and other options for treatment, the patient has consented to  Procedure(s): CRT-P implant as a surgical intervention .  Risks include but not limited to bleeding, tamponade, infection, pneumothorax, lead dislodgement, among others. The patient's history has been reviewed, patient examined, no change in status, stable for surgery.  I have reviewed the patient's chart and labs.  Questions were answered to the patient's satisfaction.    Manson [place] Curt Bears, Randall Best 08/01/2020 11:56 AM

## 2020-08-01 NOTE — Discharge Instructions (Signed)

## 2020-08-01 NOTE — Telephone Encounter (Signed)
Refill Request.  

## 2020-08-02 ENCOUNTER — Encounter (HOSPITAL_COMMUNITY): Payer: Self-pay | Admitting: Cardiology

## 2020-08-02 MED FILL — Lidocaine HCl Local Inj 1%: INTRAMUSCULAR | Qty: 60 | Status: AC

## 2020-08-03 ENCOUNTER — Other Ambulatory Visit: Payer: Self-pay | Admitting: Cardiology

## 2020-08-10 ENCOUNTER — Other Ambulatory Visit: Payer: Self-pay | Admitting: Cardiology

## 2020-08-14 ENCOUNTER — Other Ambulatory Visit: Payer: Self-pay

## 2020-08-14 ENCOUNTER — Telehealth: Payer: Self-pay | Admitting: Emergency Medicine

## 2020-08-14 ENCOUNTER — Ambulatory Visit (INDEPENDENT_AMBULATORY_CARE_PROVIDER_SITE_OTHER): Payer: 59 | Admitting: Emergency Medicine

## 2020-08-14 DIAGNOSIS — I420 Dilated cardiomyopathy: Secondary | ICD-10-CM

## 2020-08-14 NOTE — Telephone Encounter (Signed)
Patient was seen today for wound check after pacemaker implant on 08/01/20. Patient reports increased pedal edema and reports sleeping on 5 pillows at night . He reports he has not weighed himself in 2 weeks and there has not been any change in his conditio since prior to ppm implant. No SOB at rest, No CP, reports intermittent chest pressure with activity. He has not taken prn dose of diuretics due to concern for kidney function. Has appointment with dr harding on 08/01/20 and declines appointment prior to that visit.

## 2020-08-14 NOTE — Patient Instructions (Signed)
Follow up with Dr Ellyn Hack concerning your cough and chest pressure.

## 2020-08-15 NOTE — Progress Notes (Signed)
Pacemaker check in clinic. Normal device function. Thresholds, sensing, impedances consistent with previous measurements. Device programmed to maximize longevity. No mode switch or high ventricular rates noted. Device programmed at appropriate safety margins. Histogram distribution appropriate for patient activity level. Device programmed to optimize intrinsic conduction. Estimated longevity 12 years 2 months. Patient enrolled in remote follow-up and next remote12/23/21. F/U with Dr Curt Bears 11/16/19. Patient education completed.

## 2020-08-31 ENCOUNTER — Other Ambulatory Visit: Payer: Self-pay

## 2020-08-31 ENCOUNTER — Encounter: Payer: Self-pay | Admitting: Cardiology

## 2020-08-31 ENCOUNTER — Telehealth: Payer: Self-pay | Admitting: Cardiology

## 2020-08-31 ENCOUNTER — Ambulatory Visit (INDEPENDENT_AMBULATORY_CARE_PROVIDER_SITE_OTHER): Payer: 59 | Admitting: Cardiology

## 2020-08-31 VITALS — BP 122/82 | HR 61 | Ht 69.0 in | Wt 242.2 lb

## 2020-08-31 DIAGNOSIS — Z79899 Other long term (current) drug therapy: Secondary | ICD-10-CM | POA: Diagnosis not present

## 2020-08-31 DIAGNOSIS — I4819 Other persistent atrial fibrillation: Secondary | ICD-10-CM | POA: Diagnosis not present

## 2020-08-31 DIAGNOSIS — T50905A Adverse effect of unspecified drugs, medicaments and biological substances, initial encounter: Secondary | ICD-10-CM

## 2020-08-31 DIAGNOSIS — E782 Mixed hyperlipidemia: Secondary | ICD-10-CM

## 2020-08-31 DIAGNOSIS — I5043 Acute on chronic combined systolic (congestive) and diastolic (congestive) heart failure: Secondary | ICD-10-CM | POA: Diagnosis not present

## 2020-08-31 DIAGNOSIS — Z7901 Long term (current) use of anticoagulants: Secondary | ICD-10-CM

## 2020-08-31 DIAGNOSIS — N62 Hypertrophy of breast: Secondary | ICD-10-CM

## 2020-08-31 DIAGNOSIS — I495 Sick sinus syndrome: Secondary | ICD-10-CM

## 2020-08-31 DIAGNOSIS — I5042 Chronic combined systolic (congestive) and diastolic (congestive) heart failure: Secondary | ICD-10-CM | POA: Diagnosis not present

## 2020-08-31 DIAGNOSIS — I420 Dilated cardiomyopathy: Secondary | ICD-10-CM

## 2020-08-31 MED ORDER — EPLERENONE 25 MG PO TABS: 25.0000 mg | ORAL_TABLET | Freq: Every day | ORAL | 6 refills | Status: DC

## 2020-08-31 NOTE — Telephone Encounter (Signed)
Spoke with patient regarding appointment for PFT and COVID prescreening for December 2021 as ordered by Dr. Ellyn Hack.  PFT scheduled Monday 10/15/20 at 10:00 am at [facility]---arrival time is 9:30 1st floor admissions office---COVID prescreening scheduled Thursday 10/11/20 at 8:05 am at [facility], [place]---will mail information to patient and it is also in My Chart.  Patient voiced his understanding.

## 2020-08-31 NOTE — Progress Notes (Signed)
Primary Care Provider: Lyman Bishop, DO Cardiologist: [name], MD Electrophysiologist: Will Meredith Leeds, MD  Clinic Note: Chief Complaint  Patient presents with  . Follow-up    Following pacemaker  . Congestive Heart Failure    Weight gain, swelling, orthopnea   HPI:    Randall Best is a 69 y.o. male with a Complex Cardiac History below who presents today for post hospital follow-up following BiV PPM placement..  Complex Cardiac History  Dx - Afib (Afib RVR in Jan 2019) ->failed TEE/DCCV (complicated by TIA Sx) . Planned Rate control with Diltiazem. ? Telehealth visit 02/2019:"smothering spells when lying down", DOE &dry hacking cough. -->diltiazem restarted &started lasix.  June f/u ->worse"smothering" &DOE. Rate still poorly controlled. -->EKG w/ LBBB with Afib RVR  2 D Echo ordered (but due to COVID delayed until Aug). --> EF <20%with global HK, EKG now showed LBBB with Afib RVR -> urgent f/u: d/c Dilt - change to Carvedilol & Lasix increased.  ? 9/3 - [facility]  - normal coronaries. PCWP & LVEDP 17 mmHg. CO-I:4.92-2.34-> Lasix 40 mg BID on T/T & 40 mg on other days.;Started digoxin and Spiro 12.5 mg daily  9/22: EP c/s - Dr. Curt Bears: Concern for Sx of Low Output HF -->[facility] (Dr.McLean) for NYHA Cl III HF --> started Amiodarone for Rhythm Control (to avoid Afib) ? Would be CRT-D candidate if EF still down on best tolerated meds   9/24: Adv [facility] (Dr. Aundra Dubin) ->still noted dry cough, PND &orthopnea with DOE ~ mod exertion. Drinks lots of water. (pt concerned about facility fees for CHF clinic) ? Continued Amiodarone- Fasciliated DCCV 10/9 (thought for ? Afib Ablation) & potential CRT-D. Per EP => restore sinus rhythm & hope it will improve EF.  If NSR restored -->recheck Echo in ~3 months: if EF <35% -->refer for CRT-D,   If not able to DCCV, would likely proceed with crt-D & ? AVN ablation to allow BiV  pacing. ? Continued Carvedilol, digoxin, spironolactone & started on Entesto.(Low BP / unable to tolerate increased doses of Entresto - takes low-dose in the morning and intermediate dose in the evening) ? Home dry weight of 204-205 pounds.  EP (Dr. Curt Bears) 12/23 -> Echo EF the 35 and 40% (no ICD).  Still noted to be asymptomatic of A. fib, just having heart failure symptoms.;  Symptoms were better on increased Lasix and digoxin; noted exertional dyspnea. -->  Preference-= to avoid pacemaker implant. ? Increased spironolactone to 25 mg, Lasix 120 mg for 2 days then 80 twice daily for 2 days.  November 18, 2019, still noting occasional smothering.  Notably improved.  Weight was still 214 pounds.  Fatigue better. => Zio patch border to determine A. fib burden and Zaroxolyn 2.5 mg added as PRN.  Randall Best was last seen by me on February 28, 2020.  We had him wear an event monitor.  No A. fib noted. Is 30 bpm.  Recommendation was to wean off carvedilol.  Still having swelling sensations and chest discomfort off and on.  Maybe twice a month.  Noted chest discomfort with vigorous exertion.  Had not use any Zaroxolyn, and was tolerating Crestor.  Blood pressure range from 110/75-1 44/63.  Average systolic range in the 161-096 range.    Carvedilol discontinued and metolazone use reiterated.  He finally saw Dr. Curt Bears again in June 2021.  He still noted feeling weak and fatigued.  They again discussed CRT-P implantation.  Due  to worsening motion edema on he was averaging 1 month furosemide 20 twice daily and recently been seen in urgent care for red legs and swelling.  Concern for cellulitis treated was minimizing. => They noted that his EF had improved, therefore did not meet ICD criteria, however they did plan for CRT-D.  Recent Hospitalizations:   08/01/2020: BiV PPM (CRTP)insertion. => But unable to place LV lead.  Reviewed  CV studies:    The following studies were reviewed today: (if  available, images/films reviewed: From Epic Chart or Care Everywhere) . 08/01/2020-BiV PPM placed: (CRT-P => only dual-chamber PPM successfully placed.  Unable to place LV lead)   Interval History:   Randall Best returns here today actually not feeling any better since his pacemaker placement.  Not unexpectedly, because of the left ventricular knee without place.  He says that his best weight range have been about 228-230 pounds at home, but when he felt overall the best was closer to 225 pounds.  His weight is considerably up today.  At home his weight was 235 which does still indicate about 5-7 pounds up from his baseline (as noted above, earlier on, his best rate rates were in the 205 range).  He still notes is smothering sensation when he lies down.  He is now sleeping on about 5 or 6 pillows.  Edema is not as bad, but it mostly is in his abdomen.  He has not had any chest pain or pressure with rest or exertion, and has no sensation of irregular heartbeats or palpitations. He is also noted pretty significant tenderness and aching his breasts.  CV Review of Symptoms (Summary): positive for - dyspnea on exertion, edema, orthopnea and paroxysmal nocturnal dyspnea negative for - chest pain, irregular heartbeat, palpitations, rapid heart rate or Resting dyspnea, lightheadedness or dizziness, syncope/near syncope or TIA/amaurosis fugax, claudication.  The patient does not have symptoms concerning for COVID-19 infection (fever, chills, cough, or new shortness of breath).   REVIEWED OF SYSTEMS   Review of Systems  Constitutional: Positive for malaise/fatigue (Does not really have much energy.). Negative for weight loss (Weight continues to go well.).  HENT: Positive for congestion and ear pain (And congestion.).   Respiratory: Positive for shortness of breath (On exertion).   Cardiovascular: Positive for orthopnea, leg swelling and PND.  Gastrointestinal: Negative for blood in stool.   Genitourinary: Negative for hematuria.  Musculoskeletal: Negative for falls and joint pain.  Neurological: Negative for dizziness, weakness and headaches.  Psychiatric/Behavioral: Negative for memory loss. The patient is not nervous/anxious and does not have insomnia (Not sleeping well.).     I have reviewed and (if needed) personally updated the patient's problem list, medications, allergies, past medical and surgical history, social and family history.   PAST MEDICAL HISTORY   Past Medical History:  Diagnosis Date  . Asthma    as child  . Atrial fibrillation, rapid ([place]) 12/08/2017   Diagnosed during colonoscopy -presumably new onset  . BBB (bundle branch block)   . GERD (gastroesophageal reflux disease)    use meds prn  . Glaucoma   . Headache    remote h/o migraines, none in years  . Hyperlipidemia   . NICM (nonischemic cardiomyopathy) ([place]) 04/2019   Echo 05/2019 - EF reduced to <20%.  Has permanent Afib & LBBB (normal Coronaries on Cath). -->  After cardioversion and titration medications, EF up to 35-40% with  global hypokinesis..  . Thoracic aortic aneurysm ([place]) 10/20/2019   Ascending thoracic  aortic estimated 44 mm on echo  . TIA (transient ischemic attack) 01/13/2018  . Ventral hernia     PAST SURGICAL HISTORY   Past Surgical History:  Procedure Laterality Date  . BIV PACEMAKER INSERTION CRT-P N/A 08/01/2020   Procedure: BIV PACEMAKER INSERTION CRT-P;  Surgeon: Regan Lemming, MD;  Location: [facility] INVASIVE CV LAB;  Service: Cardiovascular;  Laterality: N/A;  . CARDIAC EVENT MONITOR  12/2016   showed persistent Afib -monitor return in early because of persistent A. fib.  Several episodes of severe RVR with rates in the 170s were noted.   [name] CARDIOVERSION N/A 01/13/2018   Procedure: CARDIOVERSION;  Surgeon: Chrystie Nose, MD;  Location: [facility] ENDOSCOPY;  Service: Cardiovascular;  Laterality: N/A; unsuccessful.-->  Post-cardioversion, the patient had left-sided facial  numbness and weakness.  Ruled out for stroke.  [name] CARDIOVERSION N/A 08/19/2019   Procedure: CARDIOVERSION;  Surgeon: Laurey Morale, MD;  Location: [facility] ENDOSCOPY;  Service: Cardiovascular;  Laterality: N/A;  . INSERTION OF MESH N/A 07/22/2017   Procedure: INSERTION OF MESH;  Surgeon: Emelia Loron, MD;  Location[facility] OR;  Service: General;  Laterality: N/A;  BILATERAL TAP BLOCK  . NM MYOVIEW LTD  12/2017   Shows reduced EF, but no evidence of ischemia or infarction.  EF 40 and 45%.  INTERMEDIATE RISK due to reduced function.  EF notably better on echo (50 and 55%)  . RIGHT/LEFT HEART CATH AND CORONARY ANGIOGRAPHY N/A 07/14/2019   Procedure: RIGHT/LEFT HEART CATH AND CORONARY ANGIOGRAPHY;  Surgeon: Marykay Lex, MD;  Location: [facility] INVASIVE CV LAB;; Angiographically normal coronary arteries. RHC  - PCWP & LVEDP 17 mmHg.  CO-CI 4.92-2.34  . TEE WITHOUT CARDIOVERSION N/A 01/13/2018   Procedure: TRANSESOPHAGEAL ECHOCARDIOGRAM (TEE) with cardioversion - ;  Surgeon: Chrystie Nose, MD;  Location: [facility] ENDOSCOPY;  Service: Cardiovascular;  Laterality: N/A;  Mild concentric LVH.  EF 55 to 60%.  No R WMA.  Mild ascending aortic dilation of 4.2 cm.  Dilated left atrium.  No thrombus.  . TONSILLECTOMY    . TRANSTHORACIC ECHOCARDIOGRAM  10/11/2019   EF up to 35-40%.-Moderate-severely decreased function.  "GR 1 DD ".  Normal atrial sizes.  Moderate ascending aorta-44 mL.  Relatively normal valves.  . TRANSTHORACIC ECHOCARDIOGRAM  06/2019    Severely reduced function with diffuse hypokinesis and septal dyskinesis.  EF <20%.  Cannot exclude LV thrombus.  Reduced RV function with mildly elevated RV P--37 mmHg.  Moderate RA and mild LA dilation.  A centimeter dilation 42 mm  . VENTRAL HERNIA REPAIR N/A 07/22/2017   Procedure: OPEN VENTRAL HERNIA REPAIR WITH MESH ERAS PATHWAY;  Surgeon: Emelia Loron, MD;  Location: [facility] OR;  Service: General;  Laterality: N/A;  BILATERAL TAP BLOCK    Immunization History   Administered Date(s) Administered  . Influenza, High Dose Seasonal PF 11/24/2017, 07/15/2019  . Influenza,inj,Quad PF,6+ Mos 08/04/2016  . Influenza-Unspecified 11/24/2017  . Pneumococcal Conjugate-13 07/27/2017    MEDICATIONS/ALLERGIES   Current Meds  Medication Sig  . acetaminophen (TYLENOL) 500 MG tablet Take 500 mg by mouth every 6 (six) hours as needed for moderate pain.   [name] albuterol (VENTOLIN HFA) 108 (90 Base) MCG/ACT inhaler Inhale 1 puff into the lungs every 4 (four) hours as needed for wheezing or shortness of breath.   [name] amiodarone (PACERONE) 200 MG tablet Take 1 tablet (200 mg total) by mouth daily.  . Cholecalciferol (VITAMIN D) 50 MCG (2000 UT) tablet Take 2,000 Units by mouth daily.  [name]  digoxin (LANOXIN) 0.125 MG tablet Take 0.125 mg by mouth daily.  . diphenhydrAMINE (ALLERGY RELIEF) 25 MG tablet Take 25 mg by mouth every 6 (six) hours as needed for allergies.  [name] ENTRESTO 24-26 MG Take 1 tablet by mouth every morning.  . furosemide (LASIX) 40 MG tablet TAKE 1 TABLET BY MOUTH 2 TIMES DAILY. MAY TAKE AN ADDITIONAL DOSE IF WT IS GREATER THAN 3 LBS DAILY. (Patient taking differently: Take 40 mg by mouth 2 (two) times daily. May take additional dose if greater than 3 lbs)  . hydrocortisone (ANUSOL-HC) 25 MG suppository Place 25 mg rectally 2 (two) times daily as needed for hemorrhoids or anal itching.  [name] KLOR-CON M20 20 MEQ tablet TAKE 1 TABLET BY MOUTH DAILY. MAY TAKE AN ADDITIONAL TABLET IF EXTRA LASIX IS TAKEN AS NEEDED  . metolazone (ZAROXOLYN) 2.5 MG tablet Take 1 tablet (2.5 mg total) by mouth as needed. 30 minutes prior to your furosemide (lasix) dose  . nitroGLYCERIN (NITROSTAT) 0.4 MG SL tablet Place 1 tablet (0.4 mg total) under the tongue every 5 (five) minutes as needed for chest pain.  [name] omeprazole (PRILOSEC) 20 MG capsule Take 1 capsule (20 mg total) by mouth daily.  . rosuvastatin (CRESTOR) 5 MG tablet Take 1 tablet (5 mg total) by mouth daily.  .  sacubitril-valsartan (ENTRESTO) 49-51 MG Take 1 tablet by mouth at bedtime. ,per Dr Ellyn Hack  . XARELTO 20 MG TABS tablet TAKE 1 TABLET (20 MG TOTAL) BY MOUTH DAILY WITH SUPPER.  . [DISCONTINUED] hydrocortisone (ANUSOL-HC) 25 MG suppository Place 1 suppository (25 mg total) rectally at bedtime. (Patient taking differently: Place 25 mg rectally 2 (two) times daily as needed for hemorrhoids or anal itching. )  . [DISCONTINUED] spironolactone (ALDACTONE) 25 MG tablet TAKE 1/2 TABLET BY MOUTH EVERY DAY    Allergies  Allergen Reactions  . Prilosec [Omeprazole] Nausea And Vomiting  . Statins Other (See Comments)    Muscle pains, mouth sores  . Zetia [Ezetimibe] Other (See Comments)    Broke out in sores/ muscle pain  . Iodine Swelling and Rash  . Latex Rash    Bandages not gloves    SOCIAL HISTORY/FAMILY HISTORY   Reviewed in Epic:  Pertinent findings: No new changes.  OBJCTIVE -PE, EKG, labs   Wt Readings from Last 3 Encounters:  08/31/20 242 lb 3.2 oz (109.9 kg)  08/01/20 230 lb (104.3 kg)  05/01/20 226 lb (102.5 kg)    Physical Exam: BP 122/82   Pulse 61   Ht $R'5\' 9"'Qk$  (1.753 m)   Wt 242 lb 3.2 oz (109.9 kg)   BMI 35.77 kg/m  Physical Exam Vitals reviewed.  Constitutional:      General: He is not in acute distress (No acute distress, but clearly not well.).    Appearance: He is obese. He is ill-appearing (Does not seem that he is feeling well.).     Comments: Well-groomed  Neck:     Vascular: Hepatojugular reflux and JVD (8-9 cm H2O) present. No carotid bruit.  Cardiovascular:     Rate and Rhythm: Normal rate and regular rhythm.  No extrasystoles are present.    Chest Wall: PMI is displaced (Mild lateral displacement; sustained).     Pulses: Normal pulses.     Heart sounds: S1 normal. Heart sounds are distant. No gallop (Cannot exclude, but difficult with paced rhythm).      Comments: Likely split S2 Musculoskeletal:     Cervical back: Normal range of motion.   Neurological:  Mental Status: He is alert.      Adult ECG Report  Rate: 61 ;  Rhythm: Atrial paced ventricular sensed beats with LBBB and left axis deviation.;   Narrative Interpretation: A paced, V sensed  Recent Labs:    Lab Results  Component Value Date   CHOL 232 (H) 02/28/2020   HDL 55 02/28/2020   LDLCALC 141 (H) 02/28/2020   TRIG 200 (H) 02/28/2020   CHOLHDL 4.2 02/28/2020   Lab Results  Component Value Date   CREATININE 1.11 08/31/2020   BUN 21 08/31/2020   NA 137 08/31/2020   K 5.5 (H) 08/31/2020   CL 98 08/31/2020   CO2 23 08/31/2020   Lab Results  Component Value Date   TSH 1.580 02/28/2020    ASSESSMENT/PLAN    Problem List Items Addressed This Visit    Persistent atrial fibrillation ([place]): CHA2DS2-VASc Score 4 (CHF, HTN, Age 53, Aortic Plaque) (Chronic)    I suppose he is maintaining baseline sinus rhythm on amiodarone.  Now has pacemaker placement which allows [place] to further control A. fib, however unless we can get LV pacing, I am not sure how much social help.  He is on amiodarone we will need to check sed rate (ESR) and CRP. He will eventually need PFTs checked, but I would like to get his CHF volume improved first.      Relevant Medications   eplerenone (INSPRA) 25 MG tablet   digoxin (LANOXIN) 0.125 MG tablet   sacubitril-valsartan (ENTRESTO) 49-51 MG   Other Relevant Orders   EKG 12-Lead   Brain natriuretic peptide   Basic metabolic panel   Comprehensive metabolic panel (Completed)   Sedimentation rate (Completed)   C-reactive protein (Completed)   Pulmonary Function Test   Brain natriuretic peptide   Nonischemic dilated cardiomyopathy (HCC) (Chronic)    ByClearly exacerbated A. fib and LBBB.  He definitely does better being in sinus rhythm.  He is on amiodarone to try to maintain sinus rhythm. Plan was BiV pacemaker placement due to the LBBB, however LV lead was not able to be placed.  The plan is for Dr. Lovena Le to potentially  either retry LV lead placement versus His bundle lead placement.      Relevant Medications   eplerenone (INSPRA) 25 MG tablet   digoxin (LANOXIN) 0.125 MG tablet   sacubitril-valsartan (ENTRESTO) 49-51 MG   Other Relevant Orders   Brain natriuretic peptide   Basic metabolic panel   Comprehensive metabolic panel (Completed)   Sedimentation rate (Completed)   C-reactive protein (Completed)   Pulmonary Function Test   Brain natriuretic peptide   Chronic combined systolic and diastolic heart failure (HCC) (Chronic)    Does not seem to be euvolemic now.  Weight is up.  Titrate medications as noted for acute exacerbation. We will hopefully eventually get either the LV lead placed or His bundle pacing placed.      Relevant Medications   eplerenone (INSPRA) 25 MG tablet   digoxin (LANOXIN) 0.125 MG tablet   sacubitril-valsartan (ENTRESTO) 49-51 MG   Other Relevant Orders   EKG 12-Lead   Brain natriuretic peptide   Basic metabolic panel   Comprehensive metabolic panel (Completed)   Sedimentation rate (Completed)   C-reactive protein (Completed)   Pulmonary Function Test   Brain natriuretic peptide   Hyperlipidemia (Chronic)    Lipids were not very well controlled as of April. We will need to recheck at follow-up.      Relevant Medications   eplerenone (  INSPRA) 25 MG tablet   digoxin (LANOXIN) 0.125 MG tablet   sacubitril-valsartan (ENTRESTO) 49-51 MG   Tachycardia-bradycardia syndrome (HCC) (Chronic)    Status post pacemaker      Relevant Medications   eplerenone (INSPRA) 25 MG tablet   digoxin (LANOXIN) 0.125 MG tablet   sacubitril-valsartan (ENTRESTO) 49-51 MG   Other Relevant Orders   EKG 12-Lead   Brain natriuretic peptide   Basic metabolic panel   Comprehensive metabolic panel (Completed)   Sedimentation rate (Completed)   C-reactive protein (Completed)   Pulmonary Function Test   Brain natriuretic peptide   Long term current use of amiodarone (Chronic)     Checking CRP and ESR.  Once his CHF status stabilizes, will need to assess PFTs.      Relevant Orders   EKG 12-Lead   Brain natriuretic peptide   Basic metabolic panel   Comprehensive metabolic panel (Completed)   Sedimentation rate (Completed)   C-reactive protein (Completed)   Pulmonary Function Test   Acute on chronic combined systolic and diastolic CHF (congestive heart failure) (HCC) - Primary    Continues to increase his weight from his baseline.  At this point I think resetting his baseline weight to try to be about 225 to 228 pounds, but have been as low as 205 pounds.  I suspect he is probably more volume of the anticipate.  We will need to be more aggressive with his diuresis, but eventually he needs every attempt at LV pacing.  I do not think that RV pacing will be of assistance.  He was unable to tolerate twice daily dosing of 49-51 mg Entresto.  He has not been using Zaroxolyn.  Plan:   Carvedilol was held-we will need to reevaluate pressures while being diuresed and likely to swing restarting.  He is on amiodarone which does give some beta-blocker effect.  Continue Entresto as he is currently taking (24-26 mg dose in the a.m. and 49-51 mg p.m.)  Continue digoxin  Increase Lasix dosing, and introduce more frequent use of Zaroxolyn: Goals of dry weight down to 225 pounds at home  Increase Lasix to 40 mg twice daily  For the next week I want her to take Zaroxolyn every other day before morning dose of Lasix (supplementing extra potassium with each pill)  He will then continue to use metolazone as needed every other day for weight greater than 225 Lb  Number spironolactone to eplerenone: While we are increasing his diuresis, we will continue spironolactone to maintain potassium levels, but after 2 weeks he will stop spironolactone for 2 weeks and then will start eplerenone 25 mg daily.  We will check CMP and BNP now then in 2 weeks check BMP and BNP       Relevant  Medications   eplerenone (INSPRA) 25 MG tablet   digoxin (LANOXIN) 0.125 MG tablet   sacubitril-valsartan (ENTRESTO) 49-51 MG   On continuous oral anticoagulation    Remains on Xarelto.  No bleeding issues.      Relevant Orders   Brain natriuretic peptide   Basic metabolic panel   Comprehensive metabolic panel (Completed)   Sedimentation rate (Completed)   C-reactive protein (Completed)   Drug-induced gynecomastia    The plan will be to convert from spironolactone to Claritin.  I do not do [place] actively diuresing him simply because we need the potassium fe. after 2 weeks, we will have him stop spironolactone for 2 weeks for washout before starting eplerenone.  Relevant Orders   Brain natriuretic peptide   Basic metabolic panel   Comprehensive metabolic panel (Completed)   Sedimentation rate (Completed)   C-reactive protein (Completed)   Pulmonary Function Test     COVID-19 Education: The signs and symptoms of COVID-19 were discussed with the patient and how to seek care for testing (follow up with PCP or arrange E-visit).   The importance of social distancing and COVID-19 vaccination was discussed today.  The patient is practicing social distancing & Masking.   I spent a total of 45 minutes with the patient spent in direct patient consultation.  Additional time spent with chart review  / charting (studies, outside notes, etc): 10 Total Time: 51 min   Current medicines are reviewed at length with the patient today.  (+/- concerns) n/a  This visit occurred during the SARS-CoV-2 public health emergency.  Safety protocols were in place, including screening questions prior to the visit, additional usage of staff PPE, and extensive cleaning of exam room while observing appropriate contact time as indicated for disinfecting solutions.  Notice: This dictation was prepared with Dragon dictation along with smaller phrase technology. Any transcriptional errors that result from  this process are unintentional and may not be corrected upon review.  Patient Instructions / Medication Changes & Studies & Tests Ordered   Patient Instructions  Medication Instructions:     CHANGE  TO REGULAR  BASE TAKE  -FUROSEMIDE ( LASIX) 40 MG TWICE A DAY    FOR THE WEEK    STARTING Saturday  TO THE NEXT Saturday   TAKE Metalozone  2.5 mg   30 minutes Before the first dose of furosemide every other day . Take an extra potassium pill on these days as well The goal is to have your dry weight to 225 lbs at home   in 2 weeks ( Sep 14, 2020) stop taking Spironolactone  And 2 weeks from then (Sep 28, 2020)  Start taking Eplerenone ( Inspra) 25 mg one tablet daily    *If you need a refill on your cardiac medications before your next appointment, please call your pharmacy*   Lab Work:   Need to have these labs done today  SED RATE C-RP CMP BNP   IN 2 WEEKS HAVE THESE TEST DONE BNP BMP  If you have labs (blood work) drawn today and your tests are completely normal, you will receive your results only by: [name] MyChart Message (if you have MyChart) OR . A paper copy in the mail If you have any lab test that is abnormal or we need to change your treatment, we will call you to review the results.   Testing/Procedures:  will schedule in Dec 2021 at [facility] has recommended that you have a pulmonary function test. Pulmonary Function Tests are a group of tests that measure how well air moves in and out of your lungs.    Follow-Up: At [facility], you and your health needs are our priority.  As part of our continuing mission to provide you with exceptional heart care, we have created designated Provider Care Teams.  These Care Teams include your primary Cardiologist (physician) and Advanced Practice Providers (APPs -  Physician Assistants and Nurse Practitioners) who all work together to provide you with the care you need, when you need it.    Your next  appointment:   6 week(s)  The format for your next appointment:   Virtual Visit   Provider:   Shanon Brow  Ellyn Hack, MD       Studies Ordered:   Orders Placed This Encounter  Procedures  . Brain natriuretic peptide  . Basic metabolic panel  . Comprehensive metabolic panel  . Sedimentation rate  . C-reactive protein  . Brain natriuretic peptide  . Brain natriuretic peptide  . EKG 12-Lead  . Pulmonary Function Test     [name], M.D., M.S. Interventional Cardiologist   Pager # [phone] Phone # [phone] [address] [place], [place] [address]   Thank you for choosing Heartcare at [facility]!!

## 2020-08-31 NOTE — Patient Instructions (Signed)
Medication Instructions:     CHANGE  TO REGULAR  BASE TAKE  -FUROSEMIDE ( LASIX) 40 MG TWICE A DAY    FOR THE WEEK    STARTING Saturday  TO THE NEXT Saturday   TAKE Metalozone  2.5 mg   30 minutes Before the first dose of furosemide every other day . Take an extra potassium pill on these days as well The goal is to have your dry weight to 225 lbs at home   in 2 weeks ( Sep 14, 2020) stop taking Spironolactone  And 2 weeks from then (Sep 28, 2020)  Start taking Eplerenone ( Inspra) 25 mg one tablet daily    *If you need a refill on your cardiac medications before your next appointment, please call your pharmacy*   Lab Work:   Need to have these labs done today  SED RATE C-RP CMP BNP   IN 2 WEEKS HAVE THESE TEST DONE BNP BMP  If you have labs (blood work) drawn today and your tests are completely normal, you will receive your results only by: [name] MyChart Message (if you have MyChart) OR . A paper copy in the mail If you have any lab test that is abnormal or we need to change your treatment, we will call you to review the results.   Testing/Procedures:  will schedule in Dec 2021 at [facility] has recommended that you have a pulmonary function test. Pulmonary Function Tests are a group of tests that measure how well air moves in and out of your lungs.    Follow-Up: At [facility], you and your health needs are our priority.  As part of our continuing mission to provide you with exceptional heart care, we have created designated Provider Care Teams.  These Care Teams include your primary Cardiologist (physician) and Advanced Practice Providers (APPs -  Physician Assistants and Nurse Practitioners) who all work together to provide you with the care you need, when you need it.    Your next appointment:   6 week(s)  The format for your next appointment:   Virtual Visit   Provider:   [name], MD

## 2020-09-03 ENCOUNTER — Encounter: Payer: Self-pay | Admitting: Cardiology

## 2020-09-03 LAB — COMPREHENSIVE METABOLIC PANEL
ALT: 28 IU/L (ref 0–44)
AST: 22 IU/L (ref 0–40)
Albumin/Globulin Ratio: 1.6 (ref 1.2–2.2)
Albumin: 4.6 g/dL (ref 3.8–4.8)
Alkaline Phosphatase: 63 IU/L (ref 44–121)
BUN/Creatinine Ratio: 19 (ref 10–24)
BUN: 21 mg/dL (ref 8–27)
Bilirubin Total: 0.3 mg/dL (ref 0.0–1.2)
CO2: 23 mmol/L (ref 20–29)
Calcium: 9.1 mg/dL (ref 8.6–10.2)
Chloride: 98 mmol/L (ref 96–106)
Creatinine, Ser: 1.11 mg/dL (ref 0.76–1.27)
GFR calc Af Amer: 78 mL/min/{1.73_m2} (ref >59)
GFR calc non Af Amer: 68 mL/min/{1.73_m2} (ref >59)
Globulin, Total: 2.9 g/dL (ref 1.5–4.5)
Glucose: 105 mg/dL — ABNORMAL HIGH (ref 65–99)
Potassium: 5.5 mmol/L — ABNORMAL HIGH (ref 3.5–5.2)
Sodium: 137 mmol/L (ref 134–144)
Total Protein: 7.5 g/dL (ref 6.0–8.5)

## 2020-09-03 LAB — SEDIMENTATION RATE: Sed Rate: 31 mm/hr — ABNORMAL HIGH (ref 0–30)

## 2020-09-03 LAB — C-REACTIVE PROTEIN: CRP: 6 mg/L (ref 0–10)

## 2020-09-03 LAB — BRAIN NATRIURETIC PEPTIDE: BNP: 15.1 pg/mL (ref 0.0–100.0)

## 2020-09-03 NOTE — Assessment & Plan Note (Signed)
Does not seem to be euvolemic now.  Weight is up.  Titrate medications as noted for acute exacerbation. We will hopefully eventually get either the LV lead placed or His bundle pacing placed.

## 2020-09-03 NOTE — Assessment & Plan Note (Signed)
Status post pacemaker 

## 2020-09-03 NOTE — Assessment & Plan Note (Signed)
Lipids were not very well controlled as of April. We will need to recheck at follow-up.

## 2020-09-03 NOTE — Assessment & Plan Note (Signed)
ByClearly exacerbated A. fib and LBBB.  He definitely does better being in sinus rhythm.  He is on amiodarone to try to maintain sinus rhythm. Plan was BiV pacemaker placement due to the LBBB, however LV lead was not able to be placed.  The plan is for Dr. Lovena Le to potentially either retry LV lead placement versus His bundle lead placement.

## 2020-09-03 NOTE — Assessment & Plan Note (Addendum)
Continues to increase his weight from his baseline.  At this point I think resetting his baseline weight to try to be about 225 to 228 pounds, but have been as low as 205 pounds.  I suspect he is probably more volume of the anticipate.  We will need to be more aggressive with his diuresis, but eventually he needs every attempt at LV pacing.  I do not think that RV pacing will be of assistance.  He was unable to tolerate twice daily dosing of 49-51 mg Entresto.  He has not been using Zaroxolyn.  Plan:   Carvedilol was held-we will need to reevaluate pressures while being diuresed and likely to swing restarting.  He is on amiodarone which does give some beta-blocker effect.  Continue Entresto as he is currently taking (24-26 mg dose in the a.m. and 49-51 mg p.m.)  Continue digoxin  Increase Lasix dosing, and introduce more frequent use of Zaroxolyn: Goals of dry weight down to 225 pounds at home  Increase Lasix to 40 mg twice daily  For the next week I want her to take Zaroxolyn every other day before morning dose of Lasix (supplementing extra potassium with each pill)  He will then continue to use metolazone as needed every other day for weight greater than 225 Lb  Number spironolactone to eplerenone: While we are increasing his diuresis, we will continue spironolactone to maintain potassium levels, but after 2 weeks he will stop spironolactone for 2 weeks and then will start eplerenone 25 mg daily.  We will check CMP and BNP now then in 2 weeks check BMP and BNP

## 2020-09-03 NOTE — Assessment & Plan Note (Signed)
Checking CRP and ESR.  Once his CHF status stabilizes, will need to assess PFTs.

## 2020-09-03 NOTE — Assessment & Plan Note (Signed)
The plan will be to convert from spironolactone to Claritin.  I do not do [place] actively diuresing him simply because we need the potassium fe. after 2 weeks, we will have him stop spironolactone for 2 weeks for washout before starting eplerenone.

## 2020-09-03 NOTE — Assessment & Plan Note (Signed)
I suppose he is maintaining baseline sinus rhythm on amiodarone.  Now has pacemaker placement which allows [place] to further control A. fib, however unless we can get LV pacing, I am not sure how much social help.  He is on amiodarone we will need to check sed rate (ESR) and CRP. He will eventually need PFTs checked, but I would like to get his CHF volume improved first.

## 2020-09-03 NOTE — Assessment & Plan Note (Signed)
Remains on Xarelto.  No bleeding issues.

## 2020-09-04 ENCOUNTER — Ambulatory Visit (INDEPENDENT_AMBULATORY_CARE_PROVIDER_SITE_OTHER): Payer: 59 | Admitting: Internal Medicine

## 2020-09-04 ENCOUNTER — Encounter: Payer: Self-pay | Admitting: Internal Medicine

## 2020-09-04 ENCOUNTER — Other Ambulatory Visit: Payer: Self-pay

## 2020-09-04 ENCOUNTER — Encounter: Payer: 59 | Admitting: Cardiology

## 2020-09-04 VITALS — BP 124/82 | HR 81 | Ht 69.0 in | Wt 240.6 lb

## 2020-09-04 DIAGNOSIS — I420 Dilated cardiomyopathy: Secondary | ICD-10-CM | POA: Diagnosis not present

## 2020-09-04 LAB — CBC WITH DIFFERENTIAL/PLATELET
Basophils Absolute: 0.1 10*3/uL (ref 0.0–0.2)
Basos: 1 %
EOS (ABSOLUTE): 0.3 10*3/uL (ref 0.0–0.4)
Eos: 3 %
Hematocrit: 38.1 % (ref 37.5–51.0)
Hemoglobin: 12.5 g/dL — ABNORMAL LOW (ref 13.0–17.7)
Immature Grans (Abs): 0 10*3/uL (ref 0.0–0.1)
Immature Granulocytes: 0 %
Lymphocytes Absolute: 2 10*3/uL (ref 0.7–3.1)
Lymphs: 18 %
MCH: 29.8 pg (ref 26.6–33.0)
MCHC: 32.8 g/dL (ref 31.5–35.7)
MCV: 91 fL (ref 79–97)
Monocytes Absolute: 0.8 10*3/uL (ref 0.1–0.9)
Monocytes: 7 %
Neutrophils Absolute: 8 10*3/uL — ABNORMAL HIGH (ref 1.4–7.0)
Neutrophils: 71 %
Platelets: 173 10*3/uL (ref 150–450)
RBC: 4.2 x10E6/uL (ref 4.14–5.80)
RDW: 12.9 % (ref 11.6–15.4)
WBC: 11.2 10*3/uL — ABNORMAL HIGH (ref 3.4–10.8)

## 2020-09-04 NOTE — Progress Notes (Signed)
HPI [name] is referred by Dr. Evern Bio for consideration of a Biv PM upgrade. He is a pleasant 69 yo man with a non-ischemic CM, EF 35%. He has paroxysmal atrial fib and has been maintained in NSR on amiodarone. He has LBBB and a QRS of 175 ms. His CHF has worsened to class 3. He sleeps on 3 pillows and has difficulty walking up one flight of stairs. He has peripheral edema and requires lasix and metolazone.  Allergies  Allergen Reactions  . Prilosec [Omeprazole] Nausea And Vomiting  . Statins Other (See Comments)    Muscle pains, mouth sores  . Zetia [Ezetimibe] Other (See Comments)    Broke out in sores/ muscle pain  . Iodine Swelling and Rash  . Latex Rash    Bandages not gloves     Current Outpatient Medications  Medication Sig Dispense Refill  . acetaminophen (TYLENOL) 500 MG tablet Take 500 mg by mouth every 6 (six) hours as needed for moderate pain.     [name] albuterol (VENTOLIN HFA) 108 (90 Base) MCG/ACT inhaler Inhale 1 puff into the lungs every 4 (four) hours as needed for wheezing or shortness of breath.     [name] amiodarone (PACERONE) 200 MG tablet Take 1 tablet (200 mg total) by mouth daily. 90 tablet 3  . carvedilol (COREG) 3.125 MG tablet Take 3.125 mg by mouth 2 (two) times daily with a meal.     . Cholecalciferol (VITAMIN D) 50 MCG (2000 UT) tablet Take 2,000 Units by mouth daily.    . digoxin (LANOXIN) 0.125 MG tablet Take 0.125 mg by mouth daily.    . diphenhydrAMINE (ALLERGY RELIEF) 25 MG tablet Take 25 mg by mouth every 6 (six) hours as needed for allergies.    [name] ENTRESTO 24-26 MG Take 1 tablet by mouth every morning. 60 tablet 5  . eplerenone (INSPRA) 25 MG tablet Take 1 tablet (25 mg total) by mouth daily. 30 tablet 6  . furosemide (LASIX) 40 MG tablet TAKE 1 TABLET BY MOUTH 2 TIMES DAILY. MAY TAKE AN ADDITIONAL DOSE IF WT IS GREATER THAN 3 LBS DAILY. (Patient taking differently: Take 40 mg by mouth 2 (two) times daily. May take additional dose if greater  than 3 lbs) 270 tablet 1  . hydrocortisone (ANUSOL-HC) 25 MG suppository Place 25 mg rectally 2 (two) times daily as needed for hemorrhoids or anal itching.    [name] KLOR-CON M20 20 MEQ tablet TAKE 1 TABLET BY MOUTH DAILY. MAY TAKE AN ADDITIONAL TABLET IF EXTRA LASIX IS TAKEN AS NEEDED 110 tablet 3  . omeprazole (PRILOSEC) 20 MG capsule Take 1 capsule (20 mg total) by mouth daily. 90 capsule 3  . sacubitril-valsartan (ENTRESTO) 49-51 MG Take 1 tablet by mouth at bedtime. ,per Dr Ellyn Hack    . XARELTO 20 MG TABS tablet TAKE 1 TABLET (20 MG TOTAL) BY MOUTH DAILY WITH SUPPER. 90 tablet 1  . metolazone (ZAROXOLYN) 2.5 MG tablet Take 1 tablet (2.5 mg total) by mouth as needed. 30 minutes prior to your furosemide (lasix) dose 45 tablet 3  . nitroGLYCERIN (NITROSTAT) 0.4 MG SL tablet Place 1 tablet (0.4 mg total) under the tongue every 5 (five) minutes as needed for chest pain. 25 tablet 3  . rosuvastatin (CRESTOR) 5 MG tablet Take 1 tablet (5 mg total) by mouth daily. 90 tablet 3   No current facility-administered medications for this visit.     Past Medical History:  Diagnosis Date  . Asthma  as child  . Atrial fibrillation, rapid ([place]) 12/08/2017   Diagnosed during colonoscopy -presumably new onset  . BBB (bundle branch block)   . GERD (gastroesophageal reflux disease)    use meds prn  . Glaucoma   . Headache    remote h/o migraines, none in years  . Hyperlipidemia   . NICM (nonischemic cardiomyopathy) ([place]) 04/2019   Echo 05/2019 - EF reduced to <20%.  Has permanent Afib & LBBB (normal Coronaries on Cath). -->  After cardioversion and titration medications, EF up to 35-40% with  global hypokinesis..  . Thoracic aortic aneurysm ([place]) 10/20/2019   Ascending thoracic aortic estimated 44 mm on echo  . TIA (transient ischemic attack) 01/13/2018  . Ventral hernia     ROS:   All systems reviewed and negative except as noted in the HPI.   Past Surgical History:  Procedure Laterality Date  .  BIV PACEMAKER INSERTION CRT-P N/A 08/01/2020   Procedure: BIV PACEMAKER INSERTION CRT-P;  Surgeon: Constance Haw, MD;  Location: [place] [facility];  Service: Cardiovascular;  Laterality: N/A;  . CARDIAC EVENT MONITOR  12/2016   showed persistent Afib -monitor return in early because of persistent A. fib.  Several episodes of severe RVR with rates in the 170s were noted.   [name] CARDIOVERSION N/A 01/13/2018   Procedure: CARDIOVERSION;  Surgeon: Pixie Casino, MD;  Location: [facility] ENDOSCOPY;  Service: Cardiovascular;  Laterality: N/A; unsuccessful.-->  Post-cardioversion, the patient had left-sided facial numbness and weakness.  Ruled out for stroke.  [name] CARDIOVERSION N/A 08/19/2019   Procedure: CARDIOVERSION;  Surgeon: Larey Dresser, MD;  [facility] END[facility]COPY;  Service: Cardiovascular;  Laterality: N/A;  . INSERTION OF MESH N/A 07/22/2017   Procedure: INSERTION OF MESH;  Surgeon: [name], MD;  Location: [place];  Service: General;  Laterality: N/A;  BILATERAL TAP BLOCK  . NM MYOVIEW LTD  12/2017   Shows reduced EF, but no evidence of ischemia or infarction.  EF 40 and 45%.  INTERMEDIATE RISK due to reduced function.  EF notably better on echo (50 and 55%)  . RIGHT/LEFT HEART CATH AND CORONARY ANGIOGRAPHY N/A 07/14/2019   Procedure: RIGHT/LEFT HEART CATH AND CORONARY ANGIOGRAPHY;  Surgeon: [name], MD;  Location: [place] [facility];; Angiographically normal coronary arteries. RHC  - PCWP & LVEDP 17 mmHg.  CO-CI 4.92-2.34  . TEE WITHOUT CARDIOVERSION N/A 01/13/2018   Procedure: TRANSESOPHAGEAL ECHOCARDIOGRAM (TEE) with cardioversion - ;  Surgeon: Pixie Casino, MD;  Location: [facility] ENDOSCOPY;  Service: Cardiovascular;  Laterality: N/A;  Mild concentric LVH.  EF 55 to 60%.  No R WMA.  Mild ascending aortic dilation of 4.2 cm.  Dilated left atrium.  No thrombus.  . TONSILLECTOMY    . TRANSTHORACIC ECHOCARDIOGRAM  10/11/2019   EF up to 35-40%.-Moderate-severely decreased function.  "GR 1  DD ".  Normal atrial sizes.  Moderate ascending aorta-44 mL.  Relatively normal valves.  . TRANSTHORACIC ECHOCARDIOGRAM  06/2019    Severely reduced function with diffuse hypokinesis and septal dyskinesis.  EF <20%.  Cannot exclude LV thrombus.  Reduced RV function with mildly elevated RV P--37 mmHg.  Moderate RA and mild LA dilation.  A centimeter dilation 42 mm  . VENTRAL HERNIA REPAIR N/A 07/22/2017   Procedure: OPEN VENTRAL HERNIA REPAIR WITH MESH ERAS PATHWAY;  Surgeon: [name], MD;  Location: [facility];  Service: General;  Laterality: N/A;  BILATERAL TAP BLOCK     Family History  Problem Relation Age  of Onset  . Diabetes Mother        Died at 65  . Heart failure Father        06-Apr-2023 have had A. fib, died at 5  . Hypertension Brother        He is in his 47s  . CVA Maternal Grandmother        72s  . Colon cancer Neg Hx   . Esophageal cancer Neg Hx   . Rectal cancer Neg Hx   . Stomach cancer Neg Hx      Social History   Socioeconomic History  . Marital status: Married    Spouse name: Not on file  . Number of children: Not on file  . Years of education: 38  . Highest education level: High school graduate  Occupational History  . Occupation: Retired  Tobacco Use  . Smoking status: Never Smoker  . Smokeless tobacco: Never Used  Vaping Use  . Vaping Use: Never used  Substance and Sexual Activity  . Alcohol use: Yes    Comment: Rarely  . Drug use: No  . Sexual activity: Not on file  Other Topics Concern  . Not on file  Social History Narrative   He is a married father of 67, grandfather of 1.  Lives with his wife.   He does exercise regularly most days of the week for about 30 minutes.  He walks about 1-3 miles a day.  He will occasionally also walk on the treadmill.  He notes his energy is better if he remains active.   Social Determinants of Health   Financial Resource Strain:   . Difficulty of Paying Living Expenses: Not on file  Food Insecurity:   .  Worried About Charity fundraiser in the Last Year: Not on file  . Ran Out of Food in the Last Year: Not on file  Transportation Needs:   . Lack of Transportation (Medical): Not on file  . Lack of Transportation (Non-Medical): Not on file  Physical Activity:   . Days of Exercise per Week: Not on file  . Minutes of Exercise per Session: Not on file  Stress:   . Feeling of Stress : Not on file  Social Connections:   . Frequency of Communication with Friends and Family: Not on file  . Frequency of Social Gatherings with Friends and Family: Not on file  . Attends Religious Services: Not on file  . Active Member of Clubs or Organizations: Not on file  . Attends Archivist Meetings: Not on file  . Marital Status: Not on file  Intimate Partner Violence:   . Fear of Current or Ex-Partner: Not on file  . Emotionally Abused: Not on file  . Physically Abused: Not on file  . Sexually Abused: Not on file     BP 124/82   Pulse 81   Ht 5\' 9"  (1.753 m)   Wt 240 lb 9.6 oz (109.1 kg)   SpO2 95%   BMI 35.53 kg/m   Physical Exam:  Well appearing NAD HEENT: Unremarkable Neck:  No JVD, no thyromegally Lymphatics:  No adenopathy Back:  No CVA tenderness Lungs:  Clear HEART:  Regular rate rhythm, no murmurs, no rubs, no clicks Abd:  soft, positive bowel sounds, no organomegally, no rebound, no guarding Ext:  2 plus pulses, no edema, no cyanosis, no clubbing Skin:  No rashes no nodules Neuro:  CN II through XII intact, motor grossly intact  EKG - reviewed.  NSR with atrial pacing and LBBB  DEVICE  Normal device function.  See PaceArt for details.   Assess/Plan: 1. Class 3 CHF and LBBB and EF 35% - He had an attempted LV lead placement which was unsuccessful. I have recommended insertion of a left bundle area lead, possible insertion of a LV lead and possible removal of the old RV lead to maximize his re-synchronization. I have reviewed the risk/benefits/goals/expectations of the  procedure and he wishes to proceed. 2. PAF - he will continue amiodarone. 3. Sinus node dysfunction - he is atrial pacing 4. Obesity - he will need to lose weight once he is resynchronized.  Randall Overlie Shatera Rennert,MD

## 2020-09-04 NOTE — H&P (View-Only) (Signed)
HPI Randall Best is referred by Dr. Evern Bio for consideration of a Biv PM upgrade. He is a pleasant 69 yo man with a non-ischemic CM, EF 35%. He has paroxysmal atrial fib and has been maintained in NSR on amiodarone. He has LBBB and a QRS of 175 ms. His CHF has worsened to class 3. He sleeps on 3 pillows and has difficulty walking up one flight of stairs. He has peripheral edema and requires lasix and metolazone.  Allergies  Allergen Reactions  . Prilosec [Omeprazole] Nausea And Vomiting  . Statins Other (See Comments)    Muscle pains, mouth sores  . Zetia [Ezetimibe] Other (See Comments)    Broke out in sores/ muscle pain  . Iodine Swelling and Rash  . Latex Rash    Bandages not gloves     Current Outpatient Medications  Medication Sig Dispense Refill  . acetaminophen (TYLENOL) 500 MG tablet Take 500 mg by mouth every 6 (six) hours as needed for moderate pain.     [name] albuterol (VENTOLIN HFA) 108 (90 Base) MCG/ACT inhaler Inhale 1 puff into the lungs every 4 (four) hours as needed for wheezing or shortness of breath.     [name] amiodarone (PACERONE) 200 MG tablet Take 1 tablet (200 mg total) by mouth daily. 90 tablet 3  . carvedilol (COREG) 3.125 MG tablet Take 3.125 mg by mouth 2 (two) times daily with a meal.     . Cholecalciferol (VITAMIN D) 50 MCG (2000 UT) tablet Take 2,000 Units by mouth daily.    . digoxin (LANOXIN) 0.125 MG tablet Take 0.125 mg by mouth daily.    . diphenhydrAMINE (ALLERGY RELIEF) 25 MG tablet Take 25 mg by mouth every 6 (six) hours as needed for allergies.    [name] ENTRESTO 24-26 MG Take 1 tablet by mouth every morning. 60 tablet 5  . eplerenone (INSPRA) 25 MG tablet Take 1 tablet (25 mg total) by mouth daily. 30 tablet 6  . furosemide (LASIX) 40 MG tablet TAKE 1 TABLET BY MOUTH 2 TIMES DAILY. MAY TAKE AN ADDITIONAL DOSE IF WT IS GREATER THAN 3 LBS DAILY. (Patient taking differently: Take 40 mg by mouth 2 (two) times daily. May take additional dose if greater  than 3 lbs) 270 tablet 1  . hydrocortisone (ANUSOL-HC) 25 MG suppository Place 25 mg rectally 2 (two) times daily as needed for hemorrhoids or anal itching.    [name] KLOR-CON M20 20 MEQ tablet TAKE 1 TABLET BY MOUTH DAILY. MAY TAKE AN ADDITIONAL TABLET IF EXTRA LASIX IS TAKEN AS NEEDED 110 tablet 3  . omeprazole (PRILOSEC) 20 MG capsule Take 1 capsule (20 mg total) by mouth daily. 90 capsule 3  . sacubitril-valsartan (ENTRESTO) 49-51 MG Take 1 tablet by mouth at bedtime. ,per Dr Ellyn Hack    . XARELTO 20 MG TABS tablet TAKE 1 TABLET (20 MG TOTAL) BY MOUTH DAILY WITH SUPPER. 90 tablet 1  . metolazone (ZAROXOLYN) 2.5 MG tablet Take 1 tablet (2.5 mg total) by mouth as needed. 30 minutes prior to your furosemide (lasix) dose 45 tablet 3  . nitroGLYCERIN (NITROSTAT) 0.4 MG SL tablet Place 1 tablet (0.4 mg total) under the tongue every 5 (five) minutes as needed for chest pain. 25 tablet 3  . rosuvastatin (CRESTOR) 5 MG tablet Take 1 tablet (5 mg total) by mouth daily. 90 tablet 3   No current facility-administered medications for this visit.     Past Medical History:  Diagnosis Date  . Asthma  as child  . Atrial fibrillation, rapid ([place]) 12/08/2017   Diagnosed during colonoscopy -presumably new onset  . BBB (bundle branch block)   . GERD (gastroesophageal reflux disease)    use meds prn  . Glaucoma   . Headache    remote h/o migraines, none in years  . Hyperlipidemia   . NICM (nonischemic cardiomyopathy) ([place]) 04/2019   Echo 05/2019 - EF reduced to <20%.  Has permanent Afib & LBBB (normal Coronaries on Cath). -->  After cardioversion and titration medications, EF up to 35-40% with  global hypokinesis..  . Thoracic aortic aneurysm ([place]) 10/20/2019   Ascending thoracic aortic estimated 44 mm on echo  . TIA (transient ischemic attack) 01/13/2018  . Ventral hernia     ROS:   All systems reviewed and negative except as noted in the HPI.   Past Surgical History:  Procedure Laterality Date  .  BIV PACEMAKER INSERTION CRT-P N/A 08/01/2020   Procedure: BIV PACEMAKER INSERTION CRT-P;  Surgeon: Constance Haw, MD;  Location: [place] [facility];  Service: Cardiovascular;  Laterality: N/A;  . CARDIAC EVENT MONITOR  12/2016   showed persistent Afib -monitor return in early because of persistent A. fib.  Several episodes of severe RVR with rates in the 170s were noted.   [name] CARDIOVERSION N/A 01/13/2018   Procedure: CARDIOVERSION;  Surgeon: Pixie Casino, MD;  Location: [facility] ENDOSCOPY;  Service: Cardiovascular;  Laterality: N/A; unsuccessful.-->  Post-cardioversion, the patient had left-sided facial numbness and weakness.  Ruled out for stroke.  [name] CARDIOVERSION N/A 08/19/2019   Procedure: CARDIOVERSION;  Surgeon: Larey Dresser, MD;  Location: [facility] ENDOSCOPY;  Service: Cardiovascular;  Laterality: N/A;  . INSERTION OF MESH N/A 07/22/2017   Procedure: INSERTION OF MESH;  Surgeon: [name], MD;  Location: [place];  Service: General;  Laterality: N/A;  BILATERAL TAP BLOCK  . NM MYOVIEW LTD  12/2017   Shows reduced EF, but no evidence of ischemia or infarction.  EF 40 and 45%.  INTERMEDIATE RISK due to reduced function.  EF notably better on echo (50 and 55%)  . RIGHT/LEFT HEART CATH AND CORONARY ANGIOGRAPHY N/A 07/14/2019   Procedure: RIGHT/LEFT HEART CATH AND CORONARY ANGIOGRAPHY;  Surgeon: [name], MD;  Location: [place] [facility];; Angiographically normal coronary arteries. RHC  - PCWP & LVEDP 17 mmHg.  CO-CI 4.92-2.34  . TEE WITHOUT CARDIOVERSION N/A 01/13/2018   Procedure: TRANSESOPHAGEAL ECHOCARDIOGRAM (TEE) with cardioversion - ;  Surgeon: Pixie Casino, MD;  Location: [facility] ENDOSCOPY;  Service: Cardiovascular;  Laterality: N/A;  Mild concentric LVH.  EF 55 to 60%.  No R WMA.  Mild ascending aortic dilation of 4.2 cm.  Dilated left atrium.  No thrombus.  . TONSILLECTOMY    . TRANSTHORACIC ECHOCARDIOGRAM  10/11/2019   EF up to 35-40%.-Moderate-severely decreased function.  "GR 1  DD ".  Normal atrial sizes.  Moderate ascending aorta-44 mL.  Relatively normal valves.  . TRANSTHORACIC ECHOCARDIOGRAM  06/2019    Severely reduced function with diffuse hypokinesis and septal dyskinesis.  EF <20%.  Cannot exclude LV thrombus.  Reduced RV function with mildly elevated RV P--37 mmHg.  Moderate RA and mild LA dilation.  A centimeter dilation 42 mm  . VENTRAL HERNIA REPAIR N/A 07/22/2017   Procedure: OPEN VENTRAL HERNIA REPAIR WITH MESH ERAS PATHWAY;  Surgeon: [name], MD;  Location: [place];  Service: General;  Laterality: N/A;  BILATERAL TAP BLOCK     Family History  Problem Relation Age  of Onset  . Diabetes Mother        Died at 74  . Heart failure Father        2023/04/20 have had A. fib, died at 85  . Hypertension Brother        He is in his 66s  . CVA Maternal Grandmother        63s  . Colon cancer Neg Hx   . Esophageal cancer Neg Hx   . Rectal cancer Neg Hx   . Stomach cancer Neg Hx      Social History   Socioeconomic History  . Marital status: Married    Spouse name: Not on file  . Number of children: Not on file  . Years of education: 33  . Highest education level: High school graduate  Occupational History  . Occupation: Retired  Tobacco Use  . Smoking status: Never Smoker  . Smokeless tobacco: Never Used  Vaping Use  . Vaping Use: Never used  Substance and Sexual Activity  . Alcohol use: Yes    Comment: Rarely  . Drug use: No  . Sexual activity: Not on file  Other Topics Concern  . Not on file  Social History Narrative   He is a married father of 9, grandfather of 1.  Lives with his wife.   He does exercise regularly most days of the week for about 30 minutes.  He walks about 1-3 miles a day.  He will occasionally also walk on the treadmill.  He notes his energy is better if he remains active.   Social Determinants of Health   Financial Resource Strain:   . Difficulty of Paying Living Expenses: Not on file  Food Insecurity:   .  Worried About Charity fundraiser in the Last Year: Not on file  . Ran Out of Food in the Last Year: Not on file  Transportation Needs:   . Lack of Transportation (Medical): Not on file  . Lack of Transportation (Non-Medical): Not on file  Physical Activity:   . Days of Exercise per Week: Not on file  . Minutes of Exercise per Session: Not on file  Stress:   . Feeling of Stress : Not on file  Social Connections:   . Frequency of Communication with Friends and Family: Not on file  . Frequency of Social Gatherings with Friends and Family: Not on file  . Attends Religious Services: Not on file  . Active Member of Clubs or Organizations: Not on file  . Attends Archivist Meetings: Not on file  . Marital Status: Not on file  Intimate Partner Violence:   . Fear of Current or Ex-Partner: Not on file  . Emotionally Abused: Not on file  . Physically Abused: Not on file  . Sexually Abused: Not on file     BP 124/82   Pulse 81   Ht 5\' 9"  (1.753 m)   Wt 240 lb 9.6 oz (109.1 kg)   SpO2 95%   BMI 35.53 kg/m   Physical Exam:  Well appearing NAD HEENT: Unremarkable Neck:  No JVD, no thyromegally Lymphatics:  No adenopathy Back:  No CVA tenderness Lungs:  Clear HEART:  Regular rate rhythm, no murmurs, no rubs, no clicks Abd:  soft, positive bowel sounds, no organomegally, no rebound, no guarding Ext:  2 plus pulses, no edema, no cyanosis, no clubbing Skin:  No rashes no nodules Neuro:  CN II through XII intact, motor grossly intact  EKG - reviewed.  NSR with atrial pacing and LBBB  DEVICE  Normal device function.  See PaceArt for details.   Assess/Plan: 1. Class 3 CHF and LBBB and EF 35% - He had an attempted LV lead placement which was unsuccessful. I have recommended insertion of a left bundle area lead, possible insertion of a LV lead and possible removal of the old RV lead to maximize his re-synchronization. I have reviewed the risk/benefits/goals/expectations of the  procedure and he wishes to proceed. 2. PAF - he will continue amiodarone. 3. Sinus node dysfunction - he is atrial pacing 4. Obesity - he will need to lose weight once he is resynchronized.  Randall Overlie Emilya Justen,MD

## 2020-09-04 NOTE — Patient Instructions (Addendum)
Medication Instructions:  Your physician recommends that you continue on your current medications as directed. Please refer to the Current Medication list given to you today.  Labwork: You will get lab work today:  CBC  Testing/Procedures: None ordered.  Follow-Up:  SEE INSTRUCTION LETTER  Any Other Special Instructions Will Be Listed Below (If Applicable).  If you need a refill on your cardiac medications before your next appointment, please call your pharmacy.    

## 2020-09-10 ENCOUNTER — Other Ambulatory Visit (HOSPITAL_COMMUNITY)
Admission: RE | Admit: 2020-09-10 | Discharge: 2020-09-10 | Disposition: A | Discharge status: Home / Self Care | Payer: 59 | Source: Ambulatory Visit | Attending: Internal Medicine | Admitting: Internal Medicine

## 2020-09-10 ENCOUNTER — Telehealth: Payer: Self-pay | Admitting: Cardiology

## 2020-09-10 DIAGNOSIS — Z01812 Encounter for preprocedural laboratory examination: Secondary | ICD-10-CM | POA: Diagnosis not present

## 2020-09-10 DIAGNOSIS — Z20822 Contact with and (suspected) exposure to covid-19: Secondary | ICD-10-CM | POA: Insufficient documentation

## 2020-09-10 LAB — SARS CORONAVIRUS 2 (TAT 6-24 HRS): SARS Coronavirus 2: NEGATIVE

## 2020-09-10 NOTE — Telephone Encounter (Addendum)
I am acting in triage role today to assist with patient callbacks.  Patient wanted to clarify plan for labwork. He had received instructions to have repeat labs to include BMET on 11/5 from [place] (handcopy of lab slip) and wanted to know if the labs on 08/31/20 suffice. Per BMET review this did show elevated potassium prompting holding of potassium supplement x 2 doses therefore I recommended the patient keep the plan for labs on 11/5 as previously recommended by Dr. Ellyn Hack. I also brought the 10/22 BMET to Dr. Tanna Furry attention in case it impacted plans for LV lead insertion tomorrow and he indicates it does not.   The patient also wanted to clarify appt plans for 12/10 - he was told during OV this would be a virtual visit. I see this is the case in both Dr. Allison Quarry AVS and the appt notes. He has MyChart app on his smartphone so will correct visit type to MyChart Video Visit. He will be obtain his VS that morning and be prepared 15-20 minutes beforehand to begin the visit.  He also clarified his arrival time to the hospital tomorrow per instructions under Letters.  The patient verbalized understanding and gratitude.

## 2020-09-10 NOTE — Telephone Encounter (Signed)
Patient called wanting to speak with a nurse. Please call back

## 2020-09-10 NOTE — Progress Notes (Signed)
Attempted to call patient regarding instructions for tomorrows procedure.  Left voice mail with the following items:  Arrival time 5:30, nothing to  Eat or drink after midnight, last dose of Xarelto should have been 10/30, need responsible adult to drive your home and stay overnight with him.  On morning of procedure ok to take Carvedilol, Entresto and Digoxin.

## 2020-09-11 ENCOUNTER — Other Ambulatory Visit: Payer: Self-pay

## 2020-09-11 ENCOUNTER — Ambulatory Visit (HOSPITAL_COMMUNITY): Payer: 59

## 2020-09-11 ENCOUNTER — Ambulatory Visit (HOSPITAL_COMMUNITY)
Admission: RE | Admit: 2020-09-11 | Discharge: 2020-09-11 | Disposition: A | Discharge status: Home / Self Care | Payer: 59 | Attending: Internal Medicine | Admitting: Internal Medicine

## 2020-09-11 ENCOUNTER — Ambulatory Visit (HOSPITAL_COMMUNITY)
Admission: RE | Disposition: A | Discharge status: Home / Self Care | Payer: 59 | Source: Home / Self Care | Attending: Internal Medicine

## 2020-09-11 DIAGNOSIS — Z91040 Latex allergy status: Secondary | ICD-10-CM | POA: Diagnosis not present

## 2020-09-11 DIAGNOSIS — I442 Atrioventricular block, complete: Secondary | ICD-10-CM | POA: Diagnosis present

## 2020-09-11 DIAGNOSIS — I5022 Chronic systolic (congestive) heart failure: Secondary | ICD-10-CM | POA: Diagnosis not present

## 2020-09-11 DIAGNOSIS — Z888 Allergy status to other drugs, medicaments and biological substances status: Secondary | ICD-10-CM | POA: Diagnosis not present

## 2020-09-11 DIAGNOSIS — I4821 Permanent atrial fibrillation: Secondary | ICD-10-CM | POA: Insufficient documentation

## 2020-09-11 DIAGNOSIS — Z6835 Body mass index (BMI) 35.0-35.9, adult: Secondary | ICD-10-CM | POA: Diagnosis not present

## 2020-09-11 DIAGNOSIS — E669 Obesity, unspecified: Secondary | ICD-10-CM | POA: Diagnosis not present

## 2020-09-11 DIAGNOSIS — Z8249 Family history of ischemic heart disease and other diseases of the circulatory system: Secondary | ICD-10-CM | POA: Insufficient documentation

## 2020-09-11 DIAGNOSIS — Z950 Presence of cardiac pacemaker: Secondary | ICD-10-CM

## 2020-09-11 DIAGNOSIS — I495 Sick sinus syndrome: Secondary | ICD-10-CM | POA: Insufficient documentation

## 2020-09-11 HISTORY — PX: LEAD INSERTION: EP1212

## 2020-09-11 SURGERY — LEAD INSERTION

## 2020-09-11 MED ORDER — ACETAMINOPHEN 325 MG PO TABS: 325.0000 mg | ORAL_TABLET | ORAL | Status: DC | PRN

## 2020-09-11 MED ORDER — SODIUM CHLORIDE 0.9 % IV SOLN
INTRAVENOUS | Status: AC
  Filled 2020-09-11: qty 2

## 2020-09-11 MED ORDER — CEFAZOLIN SODIUM-DEXTROSE 1-4 GM/50ML-% IV SOLN: 1.0000 g | Freq: Once | INTRAVENOUS | Status: DC

## 2020-09-11 MED ORDER — LIDOCAINE HCL 1 % IJ SOLN
INTRAMUSCULAR | Status: AC
  Filled 2020-09-11: qty 20

## 2020-09-11 MED ORDER — FENTANYL CITRATE (PF) 100 MCG/2ML IJ SOLN
INTRAMUSCULAR | Status: DC | PRN
Start: 2020-09-11 — End: 2020-09-11
  Administered 2020-09-11 (×5): 12.5 ug via INTRAVENOUS

## 2020-09-11 MED ORDER — RIVAROXABAN 20 MG PO TABS: ORAL_TABLET | ORAL | 1 refills | Status: DC

## 2020-09-11 MED ORDER — ONDANSETRON HCL 4 MG/2ML IJ SOLN: 4.0000 mg | Freq: Four times a day (QID) | INTRAMUSCULAR | Status: DC | PRN

## 2020-09-11 MED ORDER — SODIUM CHLORIDE 0.9 % IV SOLN
80.0000 mg | INTRAVENOUS | Status: AC
  Administered 2020-09-11: 80 mg
  Filled 2020-09-11: qty 2

## 2020-09-11 MED ORDER — HEPARIN (PORCINE) IN NACL 1000-0.9 UT/500ML-% IV SOLN
INTRAVENOUS | Status: AC
  Filled 2020-09-11: qty 500

## 2020-09-11 MED ORDER — SODIUM CHLORIDE 0.9 % IV SOLN: INTRAVENOUS | Status: DC

## 2020-09-11 MED ORDER — IOHEXOL 350 MG/ML SOLN
INTRAVENOUS | Status: DC | PRN
  Administered 2020-09-11: 24 mL

## 2020-09-11 MED ORDER — FENTANYL CITRATE (PF) 100 MCG/2ML IJ SOLN
INTRAMUSCULAR | Status: AC
  Filled 2020-09-11: qty 2

## 2020-09-11 MED ORDER — CHLORHEXIDINE GLUCONATE 4 % EX LIQD: 4.0000 "application " | Freq: Once | CUTANEOUS | Status: DC

## 2020-09-11 MED ORDER — CEFAZOLIN SODIUM-DEXTROSE 2-4 GM/100ML-% IV SOLN
INTRAVENOUS | Status: AC
  Filled 2020-09-11: qty 100

## 2020-09-11 MED ORDER — MIDAZOLAM HCL 5 MG/5ML IJ SOLN
INTRAMUSCULAR | Status: DC | PRN
  Administered 2020-09-11 (×6): 1 mg via INTRAVENOUS

## 2020-09-11 MED ORDER — LIDOCAINE HCL (PF) 1 % IJ SOLN
INTRAMUSCULAR | Status: DC | PRN
  Administered 2020-09-11: 60 mL

## 2020-09-11 MED ORDER — CEFAZOLIN SODIUM-DEXTROSE 2-4 GM/100ML-% IV SOLN
2.0000 g | INTRAVENOUS | Status: AC
  Administered 2020-09-11: 2 g via INTRAVENOUS

## 2020-09-11 MED ORDER — MIDAZOLAM HCL 5 MG/5ML IJ SOLN
INTRAMUSCULAR | Status: AC
  Filled 2020-09-11: qty 5

## 2020-09-11 MED ORDER — HEPARIN (PORCINE) IN NACL 1000-0.9 UT/500ML-% IV SOLN
INTRAVENOUS | Status: DC | PRN
  Administered 2020-09-11: 500 mL

## 2020-09-11 MED ORDER — SODIUM CHLORIDE 0.9 % IV SOLN
INTRAVENOUS | Status: DC | PRN
  Administered 2020-09-11: 500 mL

## 2020-09-11 SURGICAL SUPPLY — 17 items
CABLE SURGICAL S-101-97-12 (CABLE) ×2 IMPLANT
CATH ATTAIN COM SURV 6250V-MB2 (CATHETERS) ×2 IMPLANT
CATH JOSEPH QUAD ALLRED 6F REP (CATHETERS) ×2 IMPLANT
CATH RIGHTSITE C315HIS02 (CATHETERS) ×2 IMPLANT
COVER DOME SNAP 22 D (MISCELLANEOUS) ×2 IMPLANT
LEAD ATTAIN ABILITY 4396-88CM (Lead) ×2 IMPLANT
LEAD SELECT SECURE 3830 383069 (Lead) ×1 IMPLANT
PAD PRO RADIOLUCENT 2001M-C (PAD) ×2 IMPLANT
POUCH AIGIS-R ANTIBACT PPM (Mesh General) ×2 IMPLANT
SELECT SECURE 3830 383069 (Lead) ×2 IMPLANT
SHEATH 7FR PRELUDE SNAP 13 (SHEATH) ×2 IMPLANT
SHEATH 9.5FR PRELUDE SNAP 13 (SHEATH) ×2 IMPLANT
SHEATH WORLEY 9FR 62CM (SHEATH) ×2 IMPLANT
SLITTER 6232ADJ (MISCELLANEOUS) ×2 IMPLANT
TRAY PACEMAKER INSERTION (PACKS) ×2 IMPLANT
WIRE ACUITY WHISPER EDS 4648 (WIRE) ×2 IMPLANT
WIRE HI TORQ VERSACORE-J 145CM (WIRE) ×2 IMPLANT

## 2020-09-11 NOTE — Interval H&P Note (Signed)
History and Physical Interval Note:  09/11/2020 7:36 AM  Randall Best  has presented today for surgery, with the diagnosis of cardiomyopathy.  The various methods of treatment have been discussed with the patient and family. After consideration of risks, benefits and other options for treatment, the patient has consented to  Procedure(s): LEAD INSERTION - LV LEAD (N/A) as a surgical intervention.  The patient's history has been reviewed, patient examined, no change in status, stable for surgery.  I have reviewed the patient's chart and labs.  Questions were answered to the patient's satisfaction.     Cristopher Peru

## 2020-09-11 NOTE — Discharge Instructions (Signed)
After Your Pacemaker   . You have a Medtronic Pacemaker  ACTIVITY . Do not lift your arm above shoulder height for 1 week after your procedure. After 7 days, you may progress as below.     Tuesday September 18, 2020  Wednesday September 19, 2020 Thursday September 20, 2020 Friday September 21, 2020   . Do not lift, push, pull, or carry anything over 10 pounds with the affected arm until 6 weeks (Tuesday October 23, 2020 ) after your procedure.   . Do NOT DRIVE until you have been seen for your wound check, or as long as instructed by your healthcare provider.   . Ask your healthcare provider when you can go back to work   INCISION/Dressing . If you are on a blood thinner such as Coumadin, Xarelto, Eliquis, Plavix, or Pradaxa please confirm with your provider when this should be resumed. 09/15/20  . Monitor your Pacemaker site for redness, swelling, and drainage. Call the device clinic at [phone] if you experience these symptoms or fever/chills.  . If your incision is sealed with Steri-strips or staples, you may shower 10 days after your procedure or when told by your provider. Do not remove the steri-strips or let the shower hit directly on your site. You may wash around your site with soap and water.    [name] Avoid lotions, ointments, or perfumes over your incision until it is well-healed.  . You may use a hot tub or a pool AFTER your wound check appointment if the incision is completely closed.  [name] PAcemaker Alerts:  Some alerts are vibratory and others beep. These are NOT emergencies. Please call our office to let us know. If this occurs at night or on weekends, it can wait until the next business day. Send a remote transmission.  . If your device is capable of reading fluid status (for heart failure), you will be offered monthly monitoring to review this with you.   DEVICE MANAGEMENT . Remote monitoring is used to monitor your pacemaker from home. This monitoring is scheduled every  91 days by our office. It allows [place] to keep an eye on the functioning of your device to ensure it is working properly. You will routinely see your Electrophysiologist annually (more often if necessary).   . You should receive your ID card for your new device in 4-8 weeks. Keep this card with you at all times once received. Consider wearing a medical alert bracelet or necklace.  . Your Pacemaker may be MRI compatible. This will be discussed at your next office visit/wound check.  You should avoid contact with strong electric or magnetic fields.    Do not use amateur (ham) radio equipment or electric (arc) welding torches. MP3 player headphones with magnets should not be used. Some devices are safe to use if held at least 12 inches (30 cm) from your Pacemaker. These include power tools, lawn mowers, and speakers. If you are unsure if something is safe to use, ask your health care provider.   When using your cell phone, hold it to the ear that is on the opposite side from the Pacemaker. Do not leave your cell phone in a pocket over the Pacemaker.   You may safely use electric blankets, heating pads, computers, and microwave ovens.  Call the office right away if:  You have chest pain.  You feel more short of breath than you have felt before.  You feel more light-headed than you have felt before.  Your  incision starts to open up.  This information is not intended to replace advice given to you by your health care provider. Make sure you discuss any questions you have with your health care provider.

## 2020-09-12 ENCOUNTER — Encounter (HOSPITAL_COMMUNITY): Payer: Self-pay | Admitting: Internal Medicine

## 2020-09-12 MED FILL — Lidocaine HCl Local Inj 1%: INTRAMUSCULAR | Qty: 60 | Status: AC

## 2020-09-15 LAB — BASIC METABOLIC PANEL
BUN/Creatinine Ratio: 19 (ref 10–24)
BUN: 22 mg/dL (ref 8–27)
CO2: 23 mmol/L (ref 20–29)
Calcium: 9.4 mg/dL (ref 8.6–10.2)
Chloride: 101 mmol/L (ref 96–106)
Creatinine, Ser: 1.14 mg/dL (ref 0.76–1.27)
GFR calc Af Amer: 76 mL/min/{1.73_m2} (ref >59)
GFR calc non Af Amer: 66 mL/min/{1.73_m2} (ref >59)
Glucose: 101 mg/dL — ABNORMAL HIGH (ref 65–99)
Potassium: 4.9 mmol/L (ref 3.5–5.2)
Sodium: 141 mmol/L (ref 134–144)

## 2020-09-15 LAB — BRAIN NATRIURETIC PEPTIDE: BNP: 25.6 pg/mL (ref 0.0–100.0)

## 2020-09-25 ENCOUNTER — Ambulatory Visit (INDEPENDENT_AMBULATORY_CARE_PROVIDER_SITE_OTHER): Payer: 59 | Admitting: Emergency Medicine

## 2020-09-25 ENCOUNTER — Other Ambulatory Visit: Payer: Self-pay

## 2020-09-25 DIAGNOSIS — I5043 Acute on chronic combined systolic (congestive) and diastolic (congestive) heart failure: Secondary | ICD-10-CM

## 2020-09-25 LAB — CUP PACEART INCLINIC DEVICE CHECK
Battery Remaining Longevity: 130 mo
Battery Voltage: 3.17 V
Brady Statistic AP VP Percent: 83.64 %
Brady Statistic AP VS Percent: 0.21 %
Brady Statistic AS VP Percent: 16.08 %
Brady Statistic AS VS Percent: 0.07 %
Brady Statistic RA Percent Paced: 83.85 %
Brady Statistic RV Percent Paced: 99.72 %
Date Time Interrogation Session: 20211116094801
Implantable Lead Implant Date: 20210922
Implantable Lead Implant Date: 20211102
Implantable Lead Implant Date: 20211102
Implantable Lead Location: 753858
Implantable Lead Location: 753859
Implantable Lead Location: 753860
Implantable Lead Model: 3830
Implantable Lead Model: 4196
Implantable Lead Model: 5076
Implantable Pulse Generator Implant Date: 20210922
Lead Channel Impedance Value: 304 Ohm
Lead Channel Impedance Value: 323 Ohm
Lead Channel Impedance Value: 399 Ohm
Lead Channel Impedance Value: 399 Ohm
Lead Channel Impedance Value: 399 Ohm
Lead Channel Impedance Value: 475 Ohm
Lead Channel Impedance Value: 532 Ohm
Lead Channel Impedance Value: 532 Ohm
Lead Channel Impedance Value: 608 Ohm
Lead Channel Pacing Threshold Amplitude: 0.625 V
Lead Channel Pacing Threshold Amplitude: 0.75 V
Lead Channel Pacing Threshold Pulse Width: 0.4 ms
Lead Channel Pacing Threshold Pulse Width: 0.4 ms
Lead Channel Sensing Intrinsic Amplitude: 1.625 mV
Lead Channel Sensing Intrinsic Amplitude: 2.5 mV
Lead Channel Sensing Intrinsic Amplitude: 21.125 mV
Lead Channel Sensing Intrinsic Amplitude: 6.25 mV
Lead Channel Setting Pacing Amplitude: 2 V
Lead Channel Setting Pacing Amplitude: 3.5 V
Lead Channel Setting Pacing Amplitude: 3.5 V
Lead Channel Setting Pacing Pulse Width: 0.4 ms
Lead Channel Setting Pacing Pulse Width: 0.4 ms
Lead Channel Setting Sensing Sensitivity: 0.9 mV

## 2020-09-25 NOTE — Progress Notes (Signed)
Wound check appointment following lead revision/ upgrade. Steri-strips removed. Wound without redness or edema. Incision edges approximated, wound well healed. Normal device function. Thresholds, sensing, and impedances consistent with implant measurements. Device programmed at 3.5V for RA/ RV, 2.0V for LV  for extra safety margin until 3 month visit. Histogram distribution appropriate for patient and level of activity. Patient bi-ventricularly pacing 99.7% of the time.  No mode switches.  1 HVR episode occurring on date of Lead revision, appears NSVT. Optivol levels currently elevated, patient recently had change in fluid manaement regimen, reports improvement in symptoms.  Device clock adjusted for DST.  Patient educated about wound care, arm mobility, lifting restrictions. Patient is enrolled in remote monitoring, next scheduled check 11/01/21.  ROV with Dr. Lovena Le on 12/17/20.

## 2020-10-11 ENCOUNTER — Other Ambulatory Visit (HOSPITAL_COMMUNITY)
Admission: RE | Admit: 2020-10-11 | Discharge: 2020-10-11 | Disposition: A | Discharge status: Home / Self Care | Payer: 59 | Source: Ambulatory Visit | Attending: Cardiology | Admitting: Cardiology

## 2020-10-11 DIAGNOSIS — Z01812 Encounter for preprocedural laboratory examination: Secondary | ICD-10-CM | POA: Diagnosis not present

## 2020-10-11 DIAGNOSIS — Z20822 Contact with and (suspected) exposure to covid-19: Secondary | ICD-10-CM | POA: Diagnosis not present

## 2020-10-11 LAB — SARS CORONAVIRUS 2 (TAT 6-24 HRS): SARS Coronavirus 2: NEGATIVE

## 2020-10-14 ENCOUNTER — Other Ambulatory Visit (HOSPITAL_COMMUNITY): Payer: Self-pay | Admitting: Cardiology

## 2020-10-15 ENCOUNTER — Ambulatory Visit (HOSPITAL_COMMUNITY)
Admission: RE | Admit: 2020-10-15 | Discharge: 2020-10-15 | Disposition: A | Discharge status: Home / Self Care | Payer: 59 | Source: Ambulatory Visit | Attending: Cardiology | Admitting: Cardiology

## 2020-10-15 ENCOUNTER — Other Ambulatory Visit: Payer: Self-pay

## 2020-10-15 DIAGNOSIS — I420 Dilated cardiomyopathy: Secondary | ICD-10-CM | POA: Diagnosis present

## 2020-10-15 DIAGNOSIS — I4819 Other persistent atrial fibrillation: Secondary | ICD-10-CM | POA: Diagnosis present

## 2020-10-15 DIAGNOSIS — T50905A Adverse effect of unspecified drugs, medicaments and biological substances, initial encounter: Secondary | ICD-10-CM | POA: Diagnosis present

## 2020-10-15 DIAGNOSIS — I495 Sick sinus syndrome: Secondary | ICD-10-CM | POA: Diagnosis present

## 2020-10-15 DIAGNOSIS — I5042 Chronic combined systolic (congestive) and diastolic (congestive) heart failure: Secondary | ICD-10-CM | POA: Insufficient documentation

## 2020-10-15 DIAGNOSIS — Z79899 Other long term (current) drug therapy: Secondary | ICD-10-CM | POA: Diagnosis present

## 2020-10-15 DIAGNOSIS — N62 Hypertrophy of breast: Secondary | ICD-10-CM | POA: Diagnosis present

## 2020-10-15 LAB — PULMONARY FUNCTION TEST
DL/VA % pred: 111 %
DL/VA: 4.55 ml/min/mmHg/L
DLCO unc % pred: 68 %
DLCO unc: 17.34 ml/min/mmHg
FEF 25-75 Post: 1.81 L/sec
FEF 25-75 Pre: 1.67 L/sec
FEF2575-%Change-Post: 8 %
FEF2575-%Pred-Post: 73 %
FEF2575-%Pred-Pre: 67 %
FEV1-%Change-Post: 1 %
FEV1-%Pred-Post: 65 %
FEV1-%Pred-Pre: 64 %
FEV1-Post: 2.11 L
FEV1-Pre: 2.08 L
FEV1FVC-%Change-Post: 0 %
FEV1FVC-%Pred-Pre: 103 %
FEV6-%Change-Post: 0 %
FEV6-%Pred-Post: 67 %
FEV6-%Pred-Pre: 66 %
FEV6-Post: 2.75 L
FEV6-Pre: 2.73 L
FEV6FVC-%Change-Post: 0 %
FEV6FVC-%Pred-Post: 105 %
FEV6FVC-%Pred-Pre: 105 %
FVC-%Change-Post: 1 %
FVC-%Pred-Post: 63 %
FVC-%Pred-Pre: 62 %
FVC-Post: 2.76 L
FVC-Pre: 2.73 L
Post FEV1/FVC ratio: 77 %
Post FEV6/FVC ratio: 100 %
Pre FEV1/FVC ratio: 76 %
Pre FEV6/FVC Ratio: 100 %
RV % pred: 138 %
RV: 3.25 L
TLC % pred: 95 %
TLC: 6.52 L

## 2020-10-15 MED ORDER — ALBUTEROL SULFATE (2.5 MG/3ML) 0.083% IN NEBU
2.5000 mg | INHALATION_SOLUTION | Freq: Once | RESPIRATORY_TRACT | Status: AC
  Administered 2020-10-15: 2.5 mg via RESPIRATORY_TRACT

## 2020-10-16 ENCOUNTER — Other Ambulatory Visit: Payer: Self-pay | Admitting: Cardiology

## 2020-10-16 DIAGNOSIS — I5043 Acute on chronic combined systolic (congestive) and diastolic (congestive) heart failure: Secondary | ICD-10-CM

## 2020-10-16 DIAGNOSIS — I5042 Chronic combined systolic (congestive) and diastolic (congestive) heart failure: Secondary | ICD-10-CM

## 2020-10-17 DIAGNOSIS — M7989 Other specified soft tissue disorders: Secondary | ICD-10-CM

## 2020-10-18 MED ORDER — AMIODARONE HCL 200 MG PO TABS
200.0000 mg | ORAL_TABLET | Freq: Every day | ORAL | 3 refills | Status: DC
Start: 2020-10-18 — End: 2021-08-12

## 2020-10-18 NOTE — Telephone Encounter (Signed)
Called pt. Pt is messaging about leg swelling right > left. Pt states that he keeps a daily weight log.  Starting on the morning of 11/25 (thanksgiving) pt's wt was 227.9lbs. Pt states that since then his weight has been at an incline.  Pt's weight increased to 231lbs a few days after Thanksgiving which he expected. Pt states on 12/6 his wt was 235.9lbs and then increased to 239.9lbs on 12/7 and has stayed at this number for the last few days.  Pt states that on 12/6 and 12/7 that he took an additional dose of lasix (40mg ) each day. (Lasix is ordered 40mg  bid) Pt states 12/6 and 12/7 that the swelling was painful, burning, and hurt to touch. Pt states that he is sleeping with his head and leg elevated, but mentions that he doesn't feel like he is smothering like he has felt before.  Pt mentions that he is taking his metolazone (2.5mg  MWF 18mins prior to lasix) as ordered, and he took an additional 2.5mg  today.

## 2020-10-18 NOTE — Telephone Encounter (Signed)
Response to message about weight gain and edema.   Need to make sure that he is actually taking Xarelto, otherwise I would be concerned about possible DVT.  Would like to check lower extremity venous Dopplers on the right leg.  For his edema:  For 2 days do metolazone 5 mg (2 doses) and 80 mg Lasix (2 tabs) in the morning with an additional 40 mg Lasix in the evening.  (Take extra dose of potassium will be checked her dose of Lasix)  For the next 2 days take metolazone 5 mg and 80 mg Lasix  After that go back to the 2.5 mg from one) metolazone with 80 mg Lasix in the morning and 40 at night for 2 days.  I would like for him to get a baseline chemistry done tomorrow with CMP and BMP and then same labs in 1 week.  Also clarification of other medications: He is still supposed be on amiodarone 200 mg daily  If he has now started taking eplerenone, he can stop spironolactone   [name], MD

## 2020-10-19 ENCOUNTER — Encounter (HOSPITAL_COMMUNITY): Payer: 59

## 2020-10-19 ENCOUNTER — Telehealth: Payer: Self-pay | Admitting: *Deleted

## 2020-10-19 ENCOUNTER — Ambulatory Visit (HOSPITAL_COMMUNITY)
Admission: RE | Admit: 2020-10-19 | Discharge: 2020-10-19 | Disposition: A | Discharge status: Home / Self Care | Payer: 59 | Source: Ambulatory Visit | Attending: Cardiovascular Disease | Admitting: Cardiovascular Disease

## 2020-10-19 ENCOUNTER — Telehealth (INDEPENDENT_AMBULATORY_CARE_PROVIDER_SITE_OTHER): Payer: 59 | Admitting: Cardiology

## 2020-10-19 ENCOUNTER — Other Ambulatory Visit: Payer: Self-pay

## 2020-10-19 ENCOUNTER — Encounter: Payer: Self-pay | Admitting: Cardiology

## 2020-10-19 VITALS — BP 134/88 | HR 63 | Ht 69.0 in | Wt 233.1 lb

## 2020-10-19 DIAGNOSIS — I712 Thoracic aortic aneurysm, without rupture, unspecified: Secondary | ICD-10-CM

## 2020-10-19 DIAGNOSIS — I495 Sick sinus syndrome: Secondary | ICD-10-CM

## 2020-10-19 DIAGNOSIS — Z79899 Other long term (current) drug therapy: Secondary | ICD-10-CM

## 2020-10-19 DIAGNOSIS — I4819 Other persistent atrial fibrillation: Secondary | ICD-10-CM | POA: Diagnosis not present

## 2020-10-19 DIAGNOSIS — I420 Dilated cardiomyopathy: Secondary | ICD-10-CM

## 2020-10-19 DIAGNOSIS — I5043 Acute on chronic combined systolic (congestive) and diastolic (congestive) heart failure: Secondary | ICD-10-CM | POA: Diagnosis not present

## 2020-10-19 DIAGNOSIS — I872 Venous insufficiency (chronic) (peripheral): Secondary | ICD-10-CM | POA: Diagnosis not present

## 2020-10-19 DIAGNOSIS — M7989 Other specified soft tissue disorders: Secondary | ICD-10-CM

## 2020-10-19 DIAGNOSIS — Z7901 Long term (current) use of anticoagulants: Secondary | ICD-10-CM

## 2020-10-19 NOTE — Telephone Encounter (Signed)
  Patient Consent for Virtual Visit         Randall Best has provided verbal consent on 10/19/2020 for a virtual visit (video or telephone).   CONSENT FOR VIRTUAL VISIT FOR:  Randall Best  By participating in this virtual visit I agree to the following:  I hereby voluntarily request, consent and authorize [facility] and its employed or contracted physicians, physician assistants, nurse practitioners or other licensed health care professionals (the Practitioner), to provide me with telemedicine health care services (the "Services") as deemed necessary by the treating Practitioner. I acknowledge and consent to receive the Services by the Practitioner via telemedicine. I understand that the telemedicine visit will involve communicating with the Practitioner through live audiovisual communication technology and the disclosure of certain medical information by electronic transmission. I acknowledge that I have been given the opportunity to request an in-person assessment or other available alternative prior to the telemedicine visit and am voluntarily participating in the telemedicine visit.  I understand that I have the right to withhold or withdraw my consent to the use of telemedicine in the course of my care at any time, without affecting my right to future care or treatment, and that the Practitioner or I may terminate the telemedicine visit at any time. I understand that I have the right to inspect all information obtained and/or recorded in the course of the telemedicine visit and may receive copies of available information for a reasonable fee.  I understand that some of the potential risks of receiving the Services via telemedicine include:  [name] Delay or interruption in medical evaluation due to technological equipment failure or disruption; . Information transmitted may not be sufficient (e.g. poor resolution of images) to allow for appropriate medical decision making by the  Practitioner; and/or  . In rare instances, security protocols could fail, causing a breach of personal health information.  Furthermore, I acknowledge that it is my responsibility to provide information about my medical history, conditions and care that is complete and accurate to the best of my ability. I acknowledge that Practitioner's advice, recommendations, and/or decision may be based on factors not within their control, such as incomplete or inaccurate data provided by me or distortions of diagnostic images or specimens that may result from electronic transmissions. I understand that the practice of medicine is not an exact science and that Practitioner makes no warranties or guarantees regarding treatment outcomes. I acknowledge that a copy of this consent can be made available to me via my patient portal ([facility]), or I can request a printed copy by calling the office of [facility].    I understand that my insurance will be billed for this visit.   I have read or had this consent read to me. . I understand the contents of this consent, which adequately explains the benefits and risks of the Services being provided via telemedicine.  . I have been provided ample opportunity to ask questions regarding this consent and the Services and have had my questions answered to my satisfaction. . I give my informed consent for the services to be provided through the use of telemedicine in my medical care

## 2020-10-19 NOTE — Patient Instructions (Addendum)
Medication Instructions:    We will refill your amiodarone 200 mg daily  Continue to do the current plan of metolazone and Lasix as follows For 1 more day do metolazone 5 mg (2 doses) and 80 mg Lasix (2 tabs) in the morning with an additional 40 mg Lasix in the evening.  (Take extra dose of potassium will be checked her dose of Lasix)  For the next 2 days take metolazone 5 mg and 80 mg Lasix   After that go back to the 2.5 mg from one) metolazone with 80 mg Lasix in the morning and 40 at night for 2 days.   - until weight is back down to ~223 lb. this will be your Dry Weight  Once you are back down to 223-225 lb -> go back to your original dosing of 40 mg Lasix daily with 2.5 mg metolazone 3 days a week   After that:  If you gain more than 3 pounds from dry weight: Increase the Lasix dosing to 80 mg in the morning and 40 mg in the afternoon until weight returns to baseline Dry Weight.  If weight gain is greater than 5 pounds in 2 days: Increase metolazone dose to 5 mg and increase Lasix to 80 mg in the morning and 40 mg in the evening x2 days.    Contact the office for further assistance if weight does not go down the next day.  If the weight goes down more than 3 pounds from dry weight: Hold Lasix until it returns to baseline dry weight    *If you need a refill on your cardiac medications before your next appointment, please call your pharmacy*   Lab Work:  We do want you to come in today to get chemistry levels done (CMP/BNP/MAG), and then come back in for recheck of labs in 1 week ( BMP/BNP/MAG )  If you have labs (blood work) drawn today and your tests are completely normal, you will receive your results only by: [name] MyChart Message (if you have MyChart) OR . A paper copy in the mail If you have any lab test that is abnormal or we need to change your treatment, we will call you to review the results.   Testing/Procedures:  You are scheduled to have your leg vein  Dopplers done today to look for blood clots and reflux   Follow-Up: At [facility], you and your health needs are our priority.  As part of our continuing mission to provide you with exceptional heart care, we have created designated Provider Care Teams.  These Care Teams include your primary Cardiologist (physician) and Advanced Practice Providers (APPs -  Physician Assistants and Nurse Practitioners) who all work together to provide you with the care you need, when you need it.  We recommend signing up for the patient portal called "MyChart".  Sign up information is provided on this After Visit Summary.  MyChart is used to connect with patients for Virtual Visits (Telemedicine).  Patients are able to view lab/test results, encounter notes, upcoming appointments, etc.  Non-urgent messages can be sent to your provider as well.   To learn more about what you can do with MyChart, go to [email or web address].    Your next appointment:   3 month(s)  The format for your next appointment:   In Person  Provider:   [name], MD   Other Instructions  OK to get back ot exercising now that your weight is back down  When your swelling  level is high, may be try using the above the knee support hose, otherwise use the below the knee when your legs are less swollen.    recommends you purchase some compression  socks/hose from [employer] in [place] ,[place]. You do not need an prescription to purchase the items.  Address  [address] [place], [place] [address]  Phone  [phone]   Compression   strength    8-15 mmHg      ( X) 15-20 mmHg                           20-30 mmHg  30-40 mmHg.  You may also try a medical supply store, department store (i.e.- [place], [employer], [employer], specialty shoe stores ( shoe market), [employer] and [employer]) or  Chartered loss adjuster uniform store.

## 2020-10-19 NOTE — Progress Notes (Signed)
Virtual Visit via Telephone Note   This visit type was conducted due to national recommendations for restrictions regarding the COVID-19 Pandemic (e.g. social distancing) in an effort to limit this patient's exposure and mitigate transmission in our community.  Due to his co-morbid illnesses, this patient is at least at moderate risk for complications without adequate follow up.  This format is felt to be most appropriate for this patient at this time.  The patient did not have access to video technology/had technical difficulties with video requiring transitioning to audio format only (telephone).  All issues noted in this document were discussed and addressed.  No physical exam could be performed with this format.  Please refer to the patient's chart for his  consent to telehealth for Randall Best.   Patient has given verbal permission to conduct this visit via virtual appointment and to bill insurance 10/28/2020 1:09 PM     Evaluation Performed:  Follow-up visit  Date:  10/28/2020   ID:  Randall Best, DOB 14-Oct-1951, MRN [id number]  Patient Location: Home Provider Location: Office/Clinic  PCP:  Randall Bishop, DO  Cardiologist:  Randall Hew, MD  Electrophysiologist:  Randall Haw, MD   Chief Complaint:   Chief Complaint  Patient presents with  . Follow-up  . Congestive Heart Failure    Worsening lower extremity swelling, weight gain and dyspnea.  . Atrial Fibrillation    Problem List Items Addressed This Visit    Persistent atrial fibrillation ([place]): CHA2DS2-VASc Score 4 (CHF, HTN, Age 64, Aortic Plaque) (Chronic)   Nonischemic dilated cardiomyopathy (HCC) - Primary (Chronic)   Edema of right lower extremity due to peripheral venous insufficiency (Chronic)   Long term current use of amiodarone (Chronic)   Acute on chronic combined systolic and diastolic CHF (congestive heart failure) (HCC)    History of Present Illness:    Randall Best is a 69 y.o.  male with PMH notable for LBBB, PERSISTENT ATRIAL FIBRILLATION, NON-ISCHEMIC CM/CHRONIC COMBINED SYSTOLIC & DIASTOLIC HF (with recent Acute Exacerbation) - s/p BiV-PPM (with LV Pacer Lead Placed) who presents via audio/video conferencing for a telehealth visit today to discuss weight gain & increasing dyspnea.  Complex Cardiac History  Dx - Afib (Afib RVR in Jan 2019) ->failed TEE/DCCV (complicated by TIA Sx) . Planned Rate control with Diltiazem. ? Telehealth visit 02/2019:"smothering spells when lying down", DOE &dry hacking cough. -->diltiazem restarted &started lasix.  June 2020->worse"smothering" &DOE. Rate still poorly controlled. -->EKGw/LBBB w/ Afib RVR  2 D Echoordered (but due to COVID delayed until Aug). -->EF <20%with global HK, EKG now showed LBBB with Afib RVR -> urgent f/u: d/c Dilt - change to Carvedilol & Lasix increased. ? 9/3 - R&LHC - normal coronaries. PCWP & LVEDP 17 mmHg. CO-I:4.92-2.34-> Lasix 40 mg BID on T/T &40 mg on other days.;Started digoxin andSpiro12.5 mg daily  9/22: EP c/s - Dr. Curt Best: Concern for Sx of Low Output HF-->referred to [facility] (Dr.McLean) for NYHA Cl III HF--> started Amiodarone for Rhythm Control (to avoid Afib) ? Would be CRT-D candidate if EF still down on best tolerated meds   9/24:Adv [facility] (Dr.McLean)->still noted dry cough, PND &orthopnea with DOE ~ mod exertion. ? Continued Amiodarone-Fasciliated DCCV 10/9(thought for ? Afib Ablation) &potential CRT-D. PerEP =>restore sinus rhythm & hope itwill improve EF.  If NSR restored -->recheck Echo in ~3 months: if EF <35% -->refer for CRT-D,   If not able to DCCV, would likely proceed with crt-D & ? AVN ablation  to allow BiV pacing. ? Continued Carvedilol, digoxin, spironolactone & started on Entesto.(Low BP / unable to tolerate increased doses of Entresto -takes low-dose in the morning and intermediate dose in the evening) ? Home dry weight of  204-205 pounds.  EP (Dr. Curt Best) 12/23 ->Echo EF the 35 and 40% (no ICD). Preference-=to avoid pacemaker implant. ? Increased spironolactone to 25 mg, Lasix 120 mg for 2 days then 80 twice daily for 2 days.  November 18, 2019, still + [place], but.  Notably improved.  Weight was still 214 pounds.  Fatigue better. => Zio patch border to determine A. fib burden and Zaroxolyn 2.5 mg added as PRN.  08/01/2020: BiVPPM attempted - unable to place LV Lead  09/11/2020: 2nd Attempt (Dr. Lovena Best) - LV Lead placed for BiVPPM  Randall Best was last seen by me on August 31, 2020 after the initial attempted pacemaker placement. At that time his home weight was up to 228/20 3 pounds but he felt the more comfortable weight was down to 25. When we saw him his weight was even higher up to 235 pounds. Edema was not bad, but orthopnea symptoms are notably worsened-sitting up 5-6 pillows.  Not a gynecomastia with spironolactone.  Noted that he definitely feels better while in sinus rhythm  Noted that he was unable to tolerate higher dose of Entresto (24 to 26 mg in the a.m. and 49 and 51 mg p.m.), was not using Zaroxolyn.  Recommend that he use Zaroxolyn every other day along with Lasix shooting for reducing his dry weight obesity 25.  Following diuresis, convert from spironolactone to eplerenone.  PFTs ordered for Amiodarone monitoring.   -> He was seen briefly by Dr. Lovena Best on 09/04/2020: For preop evaluation. At that time he noted only sleeping on 3 pillows with dyspnea walking up a flight of steps. Still class III heart failure. _> redo PPM scheduled.   Hospitalizations:  . 20 April 2020-BiV PPM placed  On 12/8: [name]'s wife contacted [place] via [place] reference right leg swollen not enough help from diuretic.  Pictures were taken and reviewed showing the right mostly ankle swelling with dry scaly skin with venous stasis changes. Follow-up call indicated that his weight on December 6 was ~236  pounds went up to ~240 pounds on the December 7.  He had been ~228 pounds on November 25.  --Pt is messaging about leg swelling right > left. Pt states that he keeps a daily weight log.  Starting on the morning of 11/25 (thanksgiving) pt's wt was 227.9lbs. Pt states that since then his weight has been at an incline.  Pt's weight increased to 231lbs a few days after Thanksgiving which he expected. Pt states on 12/6 his wt was 235.9lbs and then increased to 239.9lbs on 12/7 and has stayed at this number for the last few days.  Pt states that on 12/6 and 12/7 that he took an additional dose of lasix ($RemoveB'40mg'gWafLnzD$ ) each day. (Lasix is ordered $RemoveBe'40mg'DgirGrMOp$  bid) Pt states 12/6 and 12/7 that the swelling was painful, burning, and hurt to touch. Pt states that he is sleeping with his head and leg elevated, but mentions that he doesn't feel like he is smothering like he has felt before.  Pt mentions that he is taking his metolazone (2.$RemoveBeforeDEI'5mg'LjkihAkaeLOGUxrA$  MWF 56mins prior to lasix) as ordered, and he took an additional 2.$RemoveBeforeD'5mg'jQDvUqxdcLjlZe$  today. -->  Right lower extremity venous ultrasound ordered -DVT unlikely since he is taking Xarelto, however unilateral edema is concerning.  Amiodarone refilled.  MD Instructions:  For his edema: Was told to take 5 mg a total of and 80 mg Lasix in the morning with additional 40 mg Lasix in the evening.  This was to for 2 days, then 5 mg metolazone +80 mg Lasix for 2 days until weight back to normal.  Get a baseline chemistry done tomorrow with CMP and BMP and then same labs in 1 week.  Also clarification of other medications: He is still supposed be on amiodarone 200 mg daily  If he has now started taking eplerenone, he can stop spironolactone   Bryan Lemma, MD   Recent - Interim CV studies:   The following studies were reviewed today: . September 11, 2020: BiV PPM upgrade -> His bundle pacing lead placed; old RV lead removed and new RV/left bundle area lead placed.  ->  Patient noted to have infrahisian  disease.    PFTs: moderate obstructive airway disease: with some air trapping and mild diffusion deficit. This would potentially suggest emphysema but there is no evidence of overinflation of the lungs which would argue against this diagnosis.  No evidence to suggest amiodarone toxicity.  Inerval History   Randall Best is being evaluated today with a weight of 233 pounds. Notably this is 6 pounds down from his peak of 239 pounds. He still notes that his right leg is definitely more swollen than the left, but overall the swelling has improved as his dyspnea. He says that he is starting to feel the benefit of having the the new pacemaker lead in place, but the swelling was getting worse by the time he was having the procedure done.  He also notes having some issues with the eplerenone-feels that he is breaking out with acne.  Currently sleeping on 3-4 pillows, and noticed that the orthopnea smothering symptoms are better when his weight is down, but are still present. He still has exertional dyspnea but no chest pain. He does not feel being in A. Fib.  A lot of this is made worse by the fact that he is having some dizziness from bilateral ear infections, with fullness and fatigue.  Cardiovascular ROS: positive for - dyspnea on exertion, edema, orthopnea, paroxysmal nocturnal dyspnea, shortness of breath and R>>L Best swelling; ~4 pillows orthopnea.  DOE with 1 flight of steps, more vigorous walking. - > This has notably improved with the most recent adjustment of medications. negative for - irregular heartbeat, palpitations, rapid heart rate or Syncope or near syncope, TIA or amaurosis fugax.  He also denies any claudication symptoms. He does feel a little restless leg type symptoms.  ROS:  Please see the history of present illness.    The patient does not have symptoms concerning for COVID-19 infection (fever, chills, cough, or new shortness of breath).  Review of Systems  Constitutional:  Positive for malaise/fatigue. Negative for weight loss (With diuresis, but still above his dry weight.).  HENT: Negative for congestion and nosebleeds.   Respiratory: Positive for cough (Getting better with the more he is diuresed). Negative for shortness of breath.   Gastrointestinal: Negative for abdominal pain, blood in stool, heartburn and melena.  Genitourinary: Negative for hematuria.  Musculoskeletal: Positive for joint pain and myalgias (Right greater than left lower leg aches).  Skin:       Breaking out with acne  Neurological: Negative for dizziness, tremors, focal weakness and headaches.  Psychiatric/Behavioral: Negative for depression (He seems to be getting pretty good spirits, but is down) and memory loss.  The patient has insomnia (Is hard of sleeping because of orthopnea.).     The patient is practicing social distancing.  Past Medical History:  Diagnosis Date  . Asthma    as child  . Atrial fibrillation, rapid (HCC) 12/08/2017   Diagnosed during colonoscopy -presumably new onset  . BBB (bundle branch block)   . GERD (gastroesophageal reflux disease)    use meds prn  . Glaucoma   . Headache    remote h/o migraines, none in years  . Hyperlipidemia   . NICM (nonischemic cardiomyopathy) (HCC) 04/2019   Echo 05/2019 - EF reduced to <20%.  Has permanent Afib & LBBB (normal Coronaries on Cath). -->  After cardioversion and titration medications, EF up to 35-40% with  global hypokinesis..  . Thoracic aortic aneurysm (HCC) 10/20/2019   Ascending thoracic aortic estimated 44 mm on echo  . TIA (transient ischemic attack) 01/13/2018  . Ventral hernia     Past Surgical History:  Procedure Laterality Date  . BIV PACEMAKER INSERTION CRT-P N/A 08/01/2020   Procedure: BIV PACEMAKER INSERTION CRT-P;  Surgeon: Regan Lemming, MD;  Location: [facility]VAS[facility] LAB;  Service: Cardiovascular;  Laterality: N/A;  . CARDIAC EVENT MONITOR  12/2016   showed persistent Afib -monitor return  in early because of persistent A. fib.  Several episodes of severe RVR with rates in the 170s were noted.   [name] CARDIOVERSION N/A 01/13/2018   Procedure: CARDIOVERSION;  Surgeon: Chrystie Nose, MD;  Location: [facility] ENDOSCOPY;  Service: Cardiovascular;  Laterality: N/A; unsuccessful.-->  Post-cardioversion, the patient had left-sided facial numbness and weakness.  Ruled out for stroke.  [name] CARDIOVERSION N/A 08/19/2019   Procedure: CARDIOVERSION;  Surgeon: Laurey Morale, MD;  Location: [facility] ENDOSCOPY;  Service: Cardiovascular;  Laterality: N/A;  . INSERTION OF MESH N/A 07/22/2017   Procedure: INSERTION OF MESH;  Surgeon: Emelia Loron, MD;  [facility];  Service: General;  Laterality: N/A;  BILATERAL TAP BLOCK  . LEAD INSERTION N/A 09/11/2020   Procedure: LEAD INSERTION - LV LEAD;  Surgeon: Marinus Maw, MD;  Location: [facility] INVASIVE CV LAB;  Service: Cardiovascular;  Laterality: N/A;  . NM MYOVIEW LTD  12/2017   Shows reduced EF, but no evidence of ischemia or infarction.  EF 40 and 45%.  INTERMEDIATE RISK due to reduced function.  EF notably better on echo (50 and 55%)  . RIGHT/LEFT HEART CATH AND CORONARY ANGIOGRAPHY N/A 07/14/2019   Procedure: RIGHT/LEFT HEART CATH AND CORONARY ANGIOGRAPHY;  Surgeon: Marykay Lex, MD;  Location: [facility] INVASIVE CV LAB;; Angiographically normal coronary arteries. RHC  - PCWP & LVEDP 17 mmHg.  CO-CI 4.92-2.34  . TEE WITHOUT CARDIOVERSION N/A 01/13/2018   Procedure: TRANSESOPHAGEAL ECHOCARDIOGRAM (TEE) with cardioversion - ;  Surgeon: Chrystie Nose, MD;  Location: [facility] ENDOSCOPY;  Service: Cardiovascular;  Laterality: N/A;  Mild concentric LVH.  EF 55 to 60%.  No R WMA.  Mild ascending aortic dilation of 4.2 cm.  Dilated left atrium.  No thrombus.  . TONSILLECTOMY    . TRANSTHORACIC ECHOCARDIOGRAM  10/11/2019   EF up to 35-40%.-Moderate-severely decreased function.  "GR 1 DD ".  Normal atrial sizes.  Moderate ascending aorta-44 mL.  Relatively normal valves.  .  TRANSTHORACIC ECHOCARDIOGRAM  06/2019    Severely reduced function with diffuse hypokinesis and septal dyskinesis.  EF <20%.  Cannot exclude LV thrombus.  Reduced RV function with mildly elevated RV P--37 mmHg.  Moderate RA and mild LA dilation.  A centimeter  dilation 42 mm  . VENTRAL HERNIA REPAIR N/A 07/22/2017   Procedure: OPEN VENTRAL HERNIA REPAIR WITH MESH ERAS PATHWAY;  Surgeon: [name], MD;  Location: [facility];  Service: General;  Laterality: N/A;  BILATERAL TAP BLOCK     Current Meds  Medication Sig  . acetaminophen (TYLENOL) 500 MG tablet Take 500 mg by mouth every 6 (six) hours as needed for moderate pain.   [name] albuterol (VENTOLIN HFA) 108 (90 Base) MCG/ACT inhaler Inhale 1 puff into the lungs every 4 (four) hours as needed for wheezing or shortness of breath.   [name] amiodarone (PACERONE) 200 MG tablet Take 1 tablet (200 mg total) by mouth daily.  . benzonatate (TESSALON) 100 MG capsule Take 100 mg by mouth 3 (three) times daily as needed for cough.  . carvedilol (COREG) 3.125 MG tablet Take 3.125 mg by mouth 2 (two) times daily with a meal.   . Cholecalciferol (VITAMIN D) 50 MCG (2000 UT) tablet Take 2,000 Units by mouth daily.  . digoxin (LANOXIN) 0.125 MG tablet TAKE 1 TABLET BY MOUTH DAILY  . ENTRESTO 24-26 MG Take 1 tablet by mouth every morning.  . furosemide (LASIX) 40 MG tablet TAKE 1 TABLET BY MOUTH 2 TIMES DAILY. MAY TAKE AN ADDITIONAL DOSE IF WT IS GREATER THAN 3 LBS DAILY. (Patient taking differently: Take 40 mg by mouth 2 (two) times daily. May take additional dose if greater than 3 lbs)  . hydrocortisone (ANUSOL-HC) 25 MG suppository Place 25 mg rectally 2 (two) times daily as needed for hemorrhoids or anal itching.  [name] KLOR-CON M20 20 MEQ tablet TAKE 1 TABLET BY MOUTH DAILY. MAY TAKE AN ADDITIONAL TABLET IF EXTRA LASIX IS TAKEN AS NEEDED (Patient taking differently: Take 20 mEq by mouth daily.)  . Multiple Vitamins-Minerals (ULTRA MENS PACK PO) Take 1 tablet by mouth  daily.  . nitroGLYCERIN (NITROSTAT) 0.4 MG SL tablet Place 1 tablet (0.4 mg total) under the tongue every 5 (five) minutes as needed for chest pain. (Patient taking differently: Place 0.4 mg under the tongue every 5 (five) minutes x 3 doses as needed for chest pain.)  . omeprazole (PRILOSEC) 20 MG capsule Take 1 capsule (20 mg total) by mouth daily.  . rivaroxaban (XARELTO) 20 MG TABS tablet TAKE 1 TABLET (20 MG TOTAL) BY MOUTH DAILY WITH SUPPER. Resume 09/15/2020  . rosuvastatin (CRESTOR) 5 MG tablet Take 1 tablet (5 mg total) by mouth daily. (Patient taking differently: Take 5 mg by mouth every evening.)  . sacubitril-valsartan (ENTRESTO) 49-51 MG Take 1 tablet by mouth at bedtime. ,per Dr Ellyn Hack  . [DISCONTINUED] eplerenone (INSPRA) 25 MG tablet Take 1 tablet (25 mg total) by mouth daily.  . [DISCONTINUED] metolazone (ZAROXOLYN) 2.5 MG tablet Take 1 tablet (2.5 mg total) by mouth as needed. 30 minutes prior to your furosemide (lasix) dose (Patient taking differently: Take 2.5 mg by mouth every Monday, Wednesday, and Friday. 30 minutes prior to your furosemide (lasix) dose)     Allergies:   Prilosec [omeprazole], Statins, Zetia [ezetimibe], Iodine, and Latex   Social History   Tobacco Use  . Smoking status: Never Smoker  . Smokeless tobacco: Never Used  Vaping Use  . Vaping Use: Never used  Substance Use Topics  . Alcohol use: Yes    Comment: Rarely  . Drug use: No     Family Hx: The patient's family history includes CVA in his maternal grandmother; Diabetes in his mother; Heart failure in his father; Hypertension in his brother. There  is no history of Colon cancer, Esophageal cancer, Rectal cancer, or Stomach cancer.   Labs/Other Tests and Data Reviewed:    EKG:  No ECG reviewed.  Recent Labs: 02/28/2020: TSH 1.580 09/04/2020: Hemoglobin 12.5; Platelets 173 10/19/2020: ALT 32 10/26/2020: BNP 17.4; BUN 19; Creatinine, Ser 1.25; Magnesium 2.1; Potassium 4.5; Sodium 141   Recent  Lipid Panel Lab Results  Component Value Date/Time   CHOL 232 (H) 02/28/2020 12:04 PM   TRIG 200 (H) 02/28/2020 12:04 PM   HDL 55 02/28/2020 12:04 PM   CHOLHDL 4.2 02/28/2020 12:04 PM   CHOLHDL 4.8 01/14/2018 06:31 AM   LDLCALC 141 (H) 02/28/2020 12:04 PM    Wt Readings from Last 3 Encounters:  10/19/20 233 lb 1.6 oz (105.7 kg)  09/11/20 239 lb (108.4 kg)  09/04/20 240 lb 9.6 oz (109.1 kg)     Objective:    Vital Signs:  BP 134/88   Pulse 63   Ht $R'5\' 9"'Kh$  (1.753 m)   Wt 233 lb 1.6 oz (105.7 kg)   BMI 34.42 kg/m   VITAL SIGNS:  reviewed pleasant male in no acute distress. A&O x 3.  normal Mood & Affect Non-labored respirations   ASSESSMENT & PLAN:    Problem List Items Addressed This Visit    Persistent atrial fibrillation ([place]): CHA2DS2-VASc Score 4 (CHF, HTN, Age 5, Aortic Plaque) (Chronic)    We are trying to maintain sinus rhythm with amiodarone.  He was unsure of whether he was supposed be on or not. We will refill amiodarone 2 mg daily.  He is on digoxin and carvedilol at low doses.  Remains on Xarelto 20 mg daily for [place].       Nonischemic dilated cardiomyopathy (HCC) - Primary (Chronic)    I suspect the etiology is a combination of LBBB atrial fibrillation. Fortunately, his EF did come up from the initial nadir of 20-25%.   For now, he is on the intermediate dosing of Entresto-perhaps as his pressures increase we can go up to the medium dose twice daily  On low-dose carvedilol-have not been on titrate up because of blood pressures.  On digoxin  Switch from spironolactone to eplerenone because of gynecomastia. However he may not be tolerating eplerenone either. Continue to monitor however I suspect that we may need to stop.  Could consider reinitiating low-dose spironolactone after a washout phase.  Are pretty aggressive diuretic regimen with metolazone and Lasix -> trying to reach a dry weight of 223 pounds. Once we are able to maintain that steady  weekend we can then gradually push down to the 215 pounds level.      Relevant Orders   Comprehensive metabolic panel (Completed)   Basic metabolic panel   Magnesium (Completed)   Brain natriuretic peptide (Completed)   Brain natriuretic peptide (Completed)   Magnesium   Edema of right lower extremity due to peripheral venous insufficiency (Chronic)    We will check full right lower extremity DVT, however is unlikely with him being on Xarelto.  Once we are able to show that there is no DVT, will recommend support stockings-we will start the 15-20 mmHg level. Would eventually like to get to the 20-30 mmHg range      Relevant Orders   Comprehensive metabolic panel (Completed)   Basic metabolic panel   Magnesium (Completed)   Brain natriuretic peptide (Completed)   Brain natriuretic peptide (Completed)   Magnesium   Thoracic aortic aneurysm (HCC) (Chronic)    At next visit we will  check CTA chest abdomen pelvis to reassess thoracic and abdominal aorta      Tachycardia-bradycardia syndrome (HCC) (Chronic)    Now status post pacemaker with LV lead placed (BiV PPM) -> CRT-P  Try to maintain rhythm with amiodarone and rate control with carvedilol/digoxin.      On continuous oral anticoagulation (Chronic)    On Xarelto no bleeding issues.   Okay to hold Xarelto 24 to 48 hours preop for any procedures or surgeries. (For spinal/neuro procedures would hold for 72 hours)      Long term current use of amiodarone (Chronic)    Follow-up CRP and ESR levels. PFTs checked-no signs of toxicity  Checking LFTs. Needs annual TFTs  Discussed importance of annual eye exam.      Acute on chronic combined systolic and diastolic CHF (congestive heart failure) (HCC)    Again, I would like his weight to get down into the 215-216 range and even lower. Last year he was 204-205 pounds. Some of the weight gain may have been related to sedentary lifestyle with Covid and heart failure. However based on  this we are still 20+ pounds above weight should be.  For now I would like to gradually get him down to the 215 pound range. Hopefully with BiV pacing he will get better cardiac output.  Plan:  Status post CRT-P -> the goal would be to maximize pacing by suppressing natural rhythm using amiodarone  Has had relatively low blood pressure is limiting titration of antihypertensive agents  With current blood pressure still none to tolerate increasing Entresto dose to 49-51 mg twice daily (still having the low dose in the morning)  Continue low-dose carvedilol 3.125 mg twice daily  We will switch to eplerenone, however I suspect that we may need to stop this because of hypersensitivity with acne.  Continue digoxin-we will need to continue closely monitor levels and electrolytes.  We will need to continue Zaroxolyn plus Lasix dosing as follows with initial plan to get down to 223 pounds.:   Continue to do the current plan of metolazone and Lasix as follows For 1 more day do metolazone 5 mg (2 doses) and 80 mg Lasix (2 tabs) in the morning with an additional 40 mg Lasix in the evening.  (Take extra dose of potassium will be checked her dose of Lasix)  For the next 2 days take metolazone 5 mg and 80 mg Lasix   After that go back to the 2.5 mg from one) metolazone with 80 mg Lasix in the morning and 40 at night for 2 days.   - until weight is back down to ~223 lb. this will be your Dry Weight  Once you are back down to 223-225 lb -> go back to your original dosing of 40 mg Lasix daily with 2.5 mg metolazone 3 days a week   After that:  If you gain more than 3 pounds from dry weight: Increase the Lasix dosing to 80 mg in the morning and 40 mg in the afternoon until weight returns to baseline Dry Weight.  If weight gain is greater than 5 pounds in 2 days: Increase metolazone dose to 5 mg and increase Lasix to 80 mg in the morning and 40 mg in the evening x2 days.    Contact the office for  further assistance if weight does not go down the next day.  If the weight goes down more than 3 pounds from dry weight: Hold Lasix until it returns to baseline  dry weight   We will check chemistry panels with CMP magnesium and BNP now and then again in 1 week.      Relevant Orders   Comprehensive metabolic panel (Completed)   Basic metabolic panel   Magnesium (Completed)   Brain natriuretic peptide (Completed)   Brain natriuretic peptide (Completed)   Magnesium      COVID-19 Education: The signs and symptoms of COVID-19 were discussed with the patient and how to seek care for testing (follow up with PCP or arrange E-visit).   The importance of social distancing was discussed today.  Time:   Today, I have spent 40 minutes with the patient with telehealth technology discussing the above problems. Additional 25 minutes spent in charting. Total time 72minutes.   Medication Adjustments/Labs and Tests Ordered: Current medicines are reviewed at length with the patient today.  Concerns regarding medicines are outlined above.   Patient Instructions  Medication Instructions:    We will refill your amiodarone 200 mg daily  Continue to do the current plan of metolazone and Lasix as follows For 1 more day do metolazone 5 mg (2 doses) and 80 mg Lasix (2 tabs) in the morning with an additional 40 mg Lasix in the evening.  (Take extra dose of potassium will be checked her dose of Lasix)  For the next 2 days take metolazone 5 mg and 80 mg Lasix   After that go back to the 2.5 mg from one) metolazone with 80 mg Lasix in the morning and 40 at night for 2 days.   - until weight is back down to ~223 lb. this will be your Dry Weight  Once you are back down to 223-225 lb -> go back to your original dosing of 40 mg Lasix daily with 2.5 mg metolazone 3 days a week   After that:  If you gain more than 3 pounds from dry weight: Increase the Lasix dosing to 80 mg in the morning and 40 mg in  the afternoon until weight returns to baseline Dry Weight.  If weight gain is greater than 5 pounds in 2 days: Increase metolazone dose to 5 mg and increase Lasix to 80 mg in the morning and 40 mg in the evening x2 days.    Contact the office for further assistance if weight does not go down the next day.  If the weight goes down more than 3 pounds from dry weight: Hold Lasix until it returns to baseline dry weight    *If you need a refill on your cardiac medications before your next appointment, please call your pharmacy*   Lab Work:  We do want you to come in today to get chemistry levels done (CMP/BNP/MAG), and then come back in for recheck of labs in 1 week ( BMP/BNP/MAG )  If you have labs (blood work) drawn today and your tests are completely normal, you will receive your results only by: [name] MyChart Message (if you have MyChart) OR . A paper copy in the mail If you have any lab test that is abnormal or we need to change your treatment, we will call you to review the results.   Testing/Procedures:  You are scheduled to have your leg vein Dopplers done today to look for blood clots and reflux   Follow-Up: At [facility], you and your health needs are our priority.  As part of our continuing mission to provide you with exceptional heart care, we have created designated Provider Care Teams.  These  Care Teams include your primary Cardiologist (physician) and Advanced Practice Providers (APPs -  Physician Assistants and Nurse Practitioners) who all work together to provide you with the care you need, when you need it.  We recommend signing up for the patient portal called "MyChart".  Sign up information is provided on this After Visit Summary.  MyChart is used to connect with patients for Virtual Visits (Telemedicine).  Patients are able to view lab/test results, encounter notes, upcoming appointments, etc.  Non-urgent messages can be sent to your provider as well.   To learn more  about what you can do with MyChart, go to [email or web address].    Your next appointment:   3 month(s)  The format for your next appointment:   In Person  Provider:   [name], MD   Other Instructions  OK to get back ot exercising now that your weight is back down  When your swelling level is high, may be try using the above the knee support hose, otherwise use the below the knee when your legs are less swollen.    recommends you purchase some compression  socks/hose from [employer] in [place] ,[place]. You do not need an prescription to purchase the items.  Address  [address] [place], [place] [address]  Phone  [phone]   Compression   strength    8-15 mmHg      ( X) 15-20 mmHg                           20-30 mmHg  30-40 mmHg.  You may also try a medical supply store, department store (i.e.- [place] [employer] [place], [employer], [employer], specialty shoe stores ( shoe market), [employer] and [employer]) or  Chartered loss adjuster uniform store.    Signed, Randall Hew, MD  10/28/2020 1:09 PM    [place]

## 2020-10-20 LAB — COMPREHENSIVE METABOLIC PANEL
ALT: 32 IU/L (ref 0–44)
AST: 30 IU/L (ref 0–40)
Albumin/Globulin Ratio: 1.8 (ref 1.2–2.2)
Albumin: 5.3 g/dL — ABNORMAL HIGH (ref 3.8–4.8)
Alkaline Phosphatase: 65 IU/L (ref 44–121)
BUN/Creatinine Ratio: 17 (ref 10–24)
BUN: 28 mg/dL — ABNORMAL HIGH (ref 8–27)
Bilirubin Total: 0.7 mg/dL (ref 0.0–1.2)
CO2: 28 mmol/L (ref 20–29)
Calcium: 10.6 mg/dL — ABNORMAL HIGH (ref 8.6–10.2)
Chloride: 91 mmol/L — ABNORMAL LOW (ref 96–106)
Creatinine, Ser: 1.65 mg/dL — ABNORMAL HIGH (ref 0.76–1.27)
GFR calc Af Amer: 49 mL/min/{1.73_m2} — ABNORMAL LOW (ref >59)
GFR calc non Af Amer: 42 mL/min/{1.73_m2} — ABNORMAL LOW (ref >59)
Globulin, Total: 2.9 g/dL (ref 1.5–4.5)
Glucose: 102 mg/dL — ABNORMAL HIGH (ref 65–99)
Potassium: 4.4 mmol/L (ref 3.5–5.2)
Sodium: 138 mmol/L (ref 134–144)
Total Protein: 8.2 g/dL (ref 6.0–8.5)

## 2020-10-20 LAB — BRAIN NATRIURETIC PEPTIDE: BNP: 6.4 pg/mL (ref 0.0–100.0)

## 2020-10-20 LAB — MAGNESIUM: Magnesium: 2 mg/dL (ref 1.6–2.3)

## 2020-10-22 ENCOUNTER — Other Ambulatory Visit: Payer: Self-pay | Admitting: Cardiology

## 2020-10-26 ENCOUNTER — Telehealth: Payer: Self-pay

## 2020-10-26 NOTE — Telephone Encounter (Signed)
Patient walked in office complaining he cannot take Eplerone due to causing sores on upper body.Dr.Harding was made aware.He advised ok to stop taking.

## 2020-10-27 LAB — BASIC METABOLIC PANEL
BUN/Creatinine Ratio: 15 (ref 10–24)
BUN: 19 mg/dL (ref 8–27)
CO2: 25 mmol/L (ref 20–29)
Calcium: 9.3 mg/dL (ref 8.6–10.2)
Chloride: 102 mmol/L (ref 96–106)
Creatinine, Ser: 1.25 mg/dL (ref 0.76–1.27)
GFR calc Af Amer: 68 mL/min/{1.73_m2} (ref >59)
GFR calc non Af Amer: 59 mL/min/{1.73_m2} — ABNORMAL LOW (ref >59)
Glucose: 99 mg/dL (ref 65–99)
Potassium: 4.5 mmol/L (ref 3.5–5.2)
Sodium: 141 mmol/L (ref 134–144)

## 2020-10-27 LAB — BRAIN NATRIURETIC PEPTIDE: BNP: 17.4 pg/mL (ref 0.0–100.0)

## 2020-10-27 LAB — MAGNESIUM: Magnesium: 2.1 mg/dL (ref 1.6–2.3)

## 2020-10-28 ENCOUNTER — Encounter: Payer: Self-pay | Admitting: Cardiology

## 2020-10-28 NOTE — Assessment & Plan Note (Signed)
Now status post pacemaker with LV lead placed (BiV PPM) -> CRT-P  Try to maintain rhythm with amiodarone and rate control with carvedilol/digoxin.

## 2020-10-28 NOTE — Assessment & Plan Note (Signed)
At next visit we will check CTA chest abdomen pelvis to reassess thoracic and abdominal aorta

## 2020-10-28 NOTE — Assessment & Plan Note (Signed)
We will check full right lower extremity DVT, however is unlikely with him being on Xarelto.  Once we are able to show that there is no DVT, will recommend support stockings-we will start the 15-20 mmHg level. Would eventually like to get to the 20-30 mmHg range

## 2020-10-28 NOTE — Assessment & Plan Note (Addendum)
Again, I would like his weight to get down into the 215-216 range and even lower. Last year he was 204-205 pounds. Some of the weight gain may have been related to sedentary lifestyle with Covid and heart failure. However based on this we are still 20+ pounds above weight should be.  For now I would like to gradually get him down to the 215 pound range. Hopefully with BiV pacing he will get better cardiac output.  Plan:  Status post CRT-P -> the goal would be to maximize pacing by suppressing natural rhythm using amiodarone  Has had relatively low blood pressure is limiting titration of antihypertensive agents  With current blood pressure still none to tolerate increasing Entresto dose to 49-51 mg twice daily (still having the low dose in the morning)  Continue low-dose carvedilol 3.125 mg twice daily  We will switch to eplerenone, however I suspect that we may need to stop this because of hypersensitivity with acne.  Continue digoxin-we will need to continue closely monitor levels and electrolytes.  We will need to continue Zaroxolyn plus Lasix dosing as follows with initial plan to get down to 223 pounds.:   Continue to do the current plan of metolazone and Lasix as follows For 1 more day do metolazone 5 mg (2 doses) and 80 mg Lasix (2 tabs) in the morning with an additional 40 mg Lasix in the evening.  (Take extra dose of potassium will be checked her dose of Lasix)  For the next 2 days take metolazone 5 mg and 80 mg Lasix   After that go back to the 2.5 mg from one) metolazone with 80 mg Lasix in the morning and 40 at night for 2 days.   - until weight is back down to ~223 lb. this will be your Dry Weight  Once you are back down to 223-225 lb -> go back to your original dosing of 40 mg Lasix daily with 2.5 mg metolazone 3 days a week   After that:  If you gain more than 3 pounds from dry weight: Increase the Lasix dosing to 80 mg in the morning and 40 mg in the afternoon until  weight returns to baseline Dry Weight.  If weight gain is greater than 5 pounds in 2 days: Increase metolazone dose to 5 mg and increase Lasix to 80 mg in the morning and 40 mg in the evening x2 days.    Contact the office for further assistance if weight does not go down the next day.  If the weight goes down more than 3 pounds from dry weight: Hold Lasix until it returns to baseline dry weight   We will check chemistry panels with CMP magnesium and BNP now and then again in 1 week.

## 2020-10-28 NOTE — Assessment & Plan Note (Signed)
On Xarelto no bleeding issues.   Okay to hold Xarelto 24 to 48 hours preop for any procedures or surgeries. (For spinal/neuro procedures would hold for 72 hours)

## 2020-10-28 NOTE — Assessment & Plan Note (Signed)
We are trying to maintain sinus rhythm with amiodarone.  He was unsure of whether he was supposed be on or not. We will refill amiodarone 2 mg daily.  He is on digoxin and carvedilol at low doses.  Remains on Xarelto 20 mg daily for [place].

## 2020-10-28 NOTE — Assessment & Plan Note (Signed)
I suspect the etiology is a combination of LBBB atrial fibrillation. Fortunately, his EF did come up from the initial nadir of 20-25%.   For now, he is on the intermediate dosing of Entresto-perhaps as his pressures increase we can go up to the medium dose twice daily  On low-dose carvedilol-have not been on titrate up because of blood pressures.  On digoxin  Switch from spironolactone to eplerenone because of gynecomastia. However he may not be tolerating eplerenone either. Continue to monitor however I suspect that we may need to stop.  Could consider reinitiating low-dose spironolactone after a washout phase.  Are pretty aggressive diuretic regimen with metolazone and Lasix -> trying to reach a dry weight of 223 pounds. Once we are able to maintain that steady weekend we can then gradually push down to the 215 pounds level.

## 2020-10-28 NOTE — Assessment & Plan Note (Signed)
Follow-up CRP and ESR levels. PFTs checked-no signs of toxicity  Checking LFTs. Needs annual TFTs  Discussed importance of annual eye exam.

## 2020-11-01 ENCOUNTER — Ambulatory Visit (INDEPENDENT_AMBULATORY_CARE_PROVIDER_SITE_OTHER): Payer: 59

## 2020-11-01 DIAGNOSIS — I420 Dilated cardiomyopathy: Secondary | ICD-10-CM | POA: Diagnosis not present

## 2020-11-02 LAB — CUP PACEART REMOTE DEVICE CHECK
Battery Remaining Longevity: 132 mo
Battery Voltage: 3.15 V
Brady Statistic AP VP Percent: 80.14 %
Brady Statistic AP VS Percent: 0.06 %
Brady Statistic AS VP Percent: 19.75 %
Brady Statistic AS VS Percent: 0.05 %
Brady Statistic RA Percent Paced: 80.19 %
Brady Statistic RV Percent Paced: 99.89 %
Date Time Interrogation Session: 20211222210125
Implantable Lead Implant Date: 20210922
Implantable Lead Implant Date: 20211102
Implantable Lead Implant Date: 20211102
Implantable Lead Location: 753858
Implantable Lead Location: 753859
Implantable Lead Location: 753860
Implantable Lead Model: 3830
Implantable Lead Model: 4196
Implantable Lead Model: 5076
Implantable Pulse Generator Implant Date: 20210922
Lead Channel Impedance Value: 342 Ohm
Lead Channel Impedance Value: 399 Ohm
Lead Channel Impedance Value: 418 Ohm
Lead Channel Impedance Value: 494 Ohm
Lead Channel Impedance Value: 494 Ohm
Lead Channel Impedance Value: 570 Ohm
Lead Channel Impedance Value: 570 Ohm
Lead Channel Impedance Value: 627 Ohm
Lead Channel Impedance Value: 798 Ohm
Lead Channel Pacing Threshold Amplitude: 0.625 V
Lead Channel Pacing Threshold Amplitude: 0.75 V
Lead Channel Pacing Threshold Pulse Width: 0.4 ms
Lead Channel Pacing Threshold Pulse Width: 0.4 ms
Lead Channel Sensing Intrinsic Amplitude: 2.75 mV
Lead Channel Sensing Intrinsic Amplitude: 2.75 mV
Lead Channel Sensing Intrinsic Amplitude: 21.125 mV
Lead Channel Sensing Intrinsic Amplitude: 6.25 mV
Lead Channel Setting Pacing Amplitude: 1.5 V
Lead Channel Setting Pacing Amplitude: 2 V
Lead Channel Setting Pacing Amplitude: 3.5 V
Lead Channel Setting Pacing Pulse Width: 0.4 ms
Lead Channel Setting Pacing Pulse Width: 0.4 ms
Lead Channel Setting Sensing Sensitivity: 0.9 mV

## 2020-11-14 NOTE — Progress Notes (Signed)
Remote pacemaker transmission.   

## 2020-11-15 ENCOUNTER — Encounter: Payer: 59 | Admitting: Cardiology

## 2020-12-15 ENCOUNTER — Other Ambulatory Visit: Payer: Self-pay | Admitting: Cardiology

## 2020-12-17 ENCOUNTER — Encounter: Payer: 59 | Admitting: Internal Medicine

## 2021-01-25 ENCOUNTER — Other Ambulatory Visit: Payer: Self-pay | Admitting: Internal Medicine

## 2021-01-27 ENCOUNTER — Other Ambulatory Visit: Payer: Self-pay | Admitting: Cardiology

## 2021-01-31 ENCOUNTER — Other Ambulatory Visit: Payer: Self-pay

## 2021-01-31 ENCOUNTER — Ambulatory Visit (INDEPENDENT_AMBULATORY_CARE_PROVIDER_SITE_OTHER): Payer: 59 | Admitting: Cardiology

## 2021-01-31 ENCOUNTER — Encounter: Payer: Self-pay | Admitting: Cardiology

## 2021-01-31 ENCOUNTER — Ambulatory Visit (INDEPENDENT_AMBULATORY_CARE_PROVIDER_SITE_OTHER): Payer: 59

## 2021-01-31 VITALS — BP 130/82 | HR 69 | Ht 69.0 in | Wt 238.4 lb

## 2021-01-31 DIAGNOSIS — E782 Mixed hyperlipidemia: Secondary | ICD-10-CM

## 2021-01-31 DIAGNOSIS — I495 Sick sinus syndrome: Secondary | ICD-10-CM

## 2021-01-31 DIAGNOSIS — J439 Emphysema, unspecified: Secondary | ICD-10-CM

## 2021-01-31 DIAGNOSIS — I420 Dilated cardiomyopathy: Secondary | ICD-10-CM | POA: Diagnosis not present

## 2021-01-31 DIAGNOSIS — T50905A Adverse effect of unspecified drugs, medicaments and biological substances, initial encounter: Secondary | ICD-10-CM

## 2021-01-31 DIAGNOSIS — I5043 Acute on chronic combined systolic (congestive) and diastolic (congestive) heart failure: Secondary | ICD-10-CM | POA: Diagnosis not present

## 2021-01-31 DIAGNOSIS — I712 Thoracic aortic aneurysm, without rupture, unspecified: Secondary | ICD-10-CM

## 2021-01-31 DIAGNOSIS — R062 Wheezing: Secondary | ICD-10-CM

## 2021-01-31 DIAGNOSIS — I5042 Chronic combined systolic (congestive) and diastolic (congestive) heart failure: Secondary | ICD-10-CM

## 2021-01-31 DIAGNOSIS — Z79899 Other long term (current) drug therapy: Secondary | ICD-10-CM

## 2021-01-31 DIAGNOSIS — I4819 Other persistent atrial fibrillation: Secondary | ICD-10-CM | POA: Diagnosis not present

## 2021-01-31 DIAGNOSIS — N62 Hypertrophy of breast: Secondary | ICD-10-CM

## 2021-01-31 DIAGNOSIS — J449 Chronic obstructive pulmonary disease, unspecified: Secondary | ICD-10-CM | POA: Insufficient documentation

## 2021-01-31 LAB — CUP PACEART REMOTE DEVICE CHECK
Battery Remaining Longevity: 127 mo
Battery Voltage: 3.08 V
Brady Statistic AP VP Percent: 83.27 %
Brady Statistic AP VS Percent: 0.06 %
Brady Statistic AS VP Percent: 16.62 %
Brady Statistic AS VS Percent: 0.04 %
Brady Statistic RA Percent Paced: 83.32 %
Brady Statistic RV Percent Paced: 99.89 %
Date Time Interrogation Session: 20220323204245
Implantable Lead Implant Date: 20210922
Implantable Lead Implant Date: 20211102
Implantable Lead Implant Date: 20211102
Implantable Lead Location: 753858
Implantable Lead Location: 753859
Implantable Lead Location: 753860
Implantable Lead Model: 3830
Implantable Lead Model: 4196
Implantable Lead Model: 5076
Implantable Pulse Generator Implant Date: 20210922
Lead Channel Impedance Value: 304 Ohm
Lead Channel Impedance Value: 399 Ohm
Lead Channel Impedance Value: 418 Ohm
Lead Channel Impedance Value: 532 Ohm
Lead Channel Impedance Value: 532 Ohm
Lead Channel Impedance Value: 551 Ohm
Lead Channel Impedance Value: 589 Ohm
Lead Channel Impedance Value: 760 Ohm
Lead Channel Impedance Value: 893 Ohm
Lead Channel Pacing Threshold Amplitude: 0.625 V
Lead Channel Pacing Threshold Amplitude: 0.75 V
Lead Channel Pacing Threshold Pulse Width: 0.4 ms
Lead Channel Pacing Threshold Pulse Width: 0.4 ms
Lead Channel Sensing Intrinsic Amplitude: 2.75 mV
Lead Channel Sensing Intrinsic Amplitude: 2.75 mV
Lead Channel Sensing Intrinsic Amplitude: 7.5 mV
Lead Channel Sensing Intrinsic Amplitude: 7.5 mV
Lead Channel Setting Pacing Amplitude: 1.5 V
Lead Channel Setting Pacing Amplitude: 2 V
Lead Channel Setting Pacing Amplitude: 2 V
Lead Channel Setting Pacing Pulse Width: 0.4 ms
Lead Channel Setting Pacing Pulse Width: 0.4 ms
Lead Channel Setting Sensing Sensitivity: 0.9 mV

## 2021-01-31 NOTE — Patient Instructions (Addendum)
Medication Instructions:    USE ALBUTEROL INHALER AS DISCUSSED WITH YOUR COUGH    FOR THE NEXT ONE WEEK TAKE 2 TABLETS OF  Furosemide DAILY EVEN ON THE DAYS YOU TAKE METOLAZONE , ON THE DAYS YOU DO NOT  TAKE YOUR METOLAZONE --  TAKE AN ADDITIONAL 20 MG FUROSEMIDE - THEN RETURN TO REGULAR DOSING   *If you need a refill on your cardiac medications before your next appointment, please call your pharmacy*   Lab Work: Today -- CBC, CMP, LIPID, TSH, DIGOXIN LEVEL   IN ONE WEEK - BMP   If you have labs (blood work) drawn today and your tests are completely normal, you will receive your results only by: [name] MyChart Message (if you have MyChart) OR . A paper copy in the mail If you have any lab test that is abnormal or we need to change your treatment, we will call you to review the results.   Testing/Procedures: Not needed   Follow-Up: At [facility], you and your health needs are our priority.  As part of our continuing mission to provide you with exceptional heart care, we have created designated Provider Care Teams.  These Care Teams include your primary Cardiologist (physician) and Advanced Practice Providers (APPs -  Physician Assistants and Nurse Practitioners) who all work together to provide you with the care you need, when you need it.     Your next appointment:   2 month(s)  The format for your next appointment:   Virtual Visit   Provider:   [name], MD   Other Instructions   You have been referred to pulmonology-[facility]  FOR ABNORMAL PFT , COUGH

## 2021-01-31 NOTE — Progress Notes (Signed)
Primary Care Provider: Lyman Bishop, DO Cardiologist: [name], MD Electrophysiologist: Will Meredith Leeds, MD  Clinic Note: Chief Complaint  Patient presents with  . Follow-up    Not feeling good   . Congestive Heart Failure    Weight is up, swelling worse, significant orthopnea.  . Atrial Fibrillation    Unable to tell if he is in or not.    ===================================  ASSESSMENT/PLAN   Problem List Items Addressed This Visit    Wheezing on inspiration    In the past he thought this may be related to allergies/pollen, however I suspect there could be some mild cardiac asthma.  Next Plan: Suggested that he continue to use his albuterol, will refer to pulmonary medicine given results of PFTs.      Relevant Orders   Comprehensive metabolic panel (Completed)   CBC (Completed)   Digoxin level (Completed)   TSH (Completed)   Ambulatory referral to Pulmonology   Acute on chronic combined systolic and diastolic CHF (congestive heart failure) ([name]) - Primary    I hope to get him down to 225 pounds last visit, the ultimate goal will be down into the 210-215 lb range, but we are now at 238 pounds.  He is quite sedentary now partly because of exertional dyspnea and Covid, he therefore may have gained some weight that is not volume related.  He has had CRT-P upgrade and return to maximize pacing with A. fib suppression using amiodarone.  Currently in sinus rhythm.   He is noting some wheezing, but I doubt that this is true pulmonary asthma, but however we will provide as needed albuterol  There was a suggestion of possible obstructive airway disease-we will refer to pulmonary medicine  FOR THE NEXT ONE WEEK TAKE 2 TABLETS OF  Furosemide DAILY EVEN ON THE DAYS YOU TAKE METOLAZONE , ON THE DAYS YOU DO NOT  TAKE YOUR METOLAZONE --  TAKE AN ADDITIONAL 20 MG FUROSEMIDE - THEN RETURN TO REGULAR DOSING   We will recheck labs first today and then after the 1 week of  up titration of diuretic => if his response to the furosemide is not significant, we could would switch him to torsemide or bumetanide.  I will low threshold to consider right heart cath and possible admission for IV diuresis.  He was reluctant to go to the [facility]-sliding cost concerns.  Unfortunately, I really think he would benefit from close monitoring in the heart failure clinic and potential IV or subcu Lasix.        Relevant Medications   rosuvastatin (CRESTOR) 5 MG tablet   Drug-induced gynecomastia    Switch to eplerenone from spironolactone.  This was tentatively stopped because of rash, but he now says he is back on it. => We may need to refill for him      COPD (chronic obstructive pulmonary disease) (HCC)   Relevant Orders   Comprehensive metabolic panel (Completed)   CBC (Completed)   Digoxin level (Completed)   TSH (Completed)   Ambulatory referral to Pulmonology   Persistent atrial fibrillation [facility]): CHA2DS2-VASc Score 4 (CHF, HTN, Age 63, Aortic Plaque) (Chronic)    Still trying to maintain sinus rhythm with amiodarone.  He is not able to tell me if he is or is not in A. fib.  Hopefully we can get this from the [name] interrogation.  (I am not sure if the CRT-P PPM can provide OptiVol type of information of thoracic impedance) ->  we will asked Dr. Curt Bears to provide assistance with this.  For now continue amiodarone and digoxin along with carvedilol.  We are attempting to maintain hythm control with amiodarone      Relevant Medications   rosuvastatin (CRESTOR) 5 MG tablet   Other Relevant Orders   Lipid panel (Completed)   Comprehensive metabolic panel (Completed)   CBC (Completed)   Digoxin level (Completed)   TSH (Completed)   Nonischemic dilated cardiomyopathy (HCC) (Chronic)    I suspect that the reduced EF is related to A. fib RVR and LBBB.   his EF has improved some but has not been checked recently.  We will likely reassess echo,  especially for consider right and left heart cath.        Relevant Medications   rosuvastatin (CRESTOR) 5 MG tablet   Other Relevant Orders   Lipid panel (Completed)   Comprehensive metabolic panel (Completed)   CBC (Completed)   Digoxin level (Completed)   TSH (Completed)   Basic metabolic panel   Chronic combined systolic and diastolic heart failure (HCC) (Chronic)    Clearly not euvolemic as his weight is back up again.  I do not think he fully understands the concept of sliding scale.  His weight is definitely up and therefore he is probably not taking extra Lasix.  I reiterated importance of daily weights and that his target weight is actually down to 225 if not all the way down to 223.  Before all this started he was in the 202-203 pound range.  He remains on stable regimen at this time:  Coreg 3.125 mg twice daily (we have not titrated further because of fatigue issues)  He is also on digoxin  Entresto-currently taking 24-26 mg in the morning and 49-51 mg p.m.=> Would like to titrate back up to full 49-51 mg daily, but has had dizziness.  He says he is actually back taking eplerenone.  We need to refill that for him.  He now is status post CRT-P with some improved symptoms.  He is currently taking 40 million Lasix twice daily but able to take extra doses PRN.   He also has metolazone 2.5 mg to take 3 days a week.    Plan was for sliding scale dosing of Lasix, but clearly that has not happened.        Relevant Medications   rosuvastatin (CRESTOR) 5 MG tablet   Other Relevant Orders   Lipid panel (Completed)   Comprehensive metabolic panel (Completed)   CBC (Completed)   Digoxin level (Completed)   TSH (Completed)   Basic metabolic panel   Thoracic aortic aneurysm (HCC) (Chronic)    Will be due for reassessment using CTA of the chest abdomen pelvis this year.      Relevant Medications   rosuvastatin (CRESTOR) 5 MG tablet   Hyperlipidemia (Chronic)    Labs  checked today showed LDL 121.  Would like to be less than 100.' Currently only on low-dose rosuvastatin.  I want to get his heart failure cleared up, and then will probably try to titrate up to higher dose of rosuvastatin, I does not want those potential side effects of being confused.      Relevant Medications   rosuvastatin (CRESTOR) 5 MG tablet   Other Relevant Orders   Lipid panel (Completed)   Long term current use of amiodarone (Chronic)    PFTs as well as ESR and CRP were normal.  Suggesting no pulmonary toxicity.  Interestingly, he did  have signs of moderate obstructive disease.  I wonder if this is potentially cardiac asthma.  Thankfully, there was no evidence of pulmonary toxicity.  Follow LFTs and TFTs.  Also reminded him to pursue annual eye exam.      Relevant Orders   Comprehensive metabolic panel (Completed)   CBC (Completed)   Digoxin level (Completed)   TSH (Completed)   Ambulatory referral to Pulmonology      ===================================  HPI:    Randall Best is a 70 y.o. male with a PMH notable for LBBB, PERSISTENT ATRIAL FIBRILLATION, NON-ISCHEMIC CM/CHRONIC COMBINED SYSTOLIC & DIASTOLIC HF (with recent Acute Exacerbation) - s/p BiV-PPM (with LV Pacer Lead Placed) who presents today for 25-month follow-up.  Complex Cardiac History reviewed in December 2021 note -  January 2019-A. fib RVR diagnosis; failed TEE DCCV (complicated by TIA)  NONISCHEMIC CARDIOMYOPATHY: April February 28, 2019-noted swelling sensation with DOE and dry hacking cough  June 2020-still having smothering and DOE.  Poor control A. fib RVR with LBBB. =>   Echo (delayed due to COVID-19 shutdown) ; EF <20% with global HK.  LBBB with A. fib RVR. => Seen in urgent follow-up, DC discontinued, converted to carvedilol and Lasix increase.  07/14/2019:R&LHC: Normal coronaries.  PCWP and LVEDP = 17 mmHg.  CO-CI 4.92, 2.34.  Lasix 40 twice daily, digoxin and spironolactone  started.  08/02/2019: EP referral to Dr. Curt Bears (seen briefly by Dr. Aundra Dubin In the [facility] => started on amiodarone for rhythm control; consider CRT-D   Facilitated DCCV attempt -> continued carvedilol, digoxin, spironolactone, Entresto.  (At that time Home weight was 204 to 205 pounds)  11/02/2019: EF 35 and 40%.  (No ICD placed) still in A. fib, symptoms improved.  Planned to avoid pacemaker at that time.  January 2021-still having smothering sensations.  Weight 214 pounds. => Zio patch for A. fib burden; as needed Zaroxolyn.  No A. fib noted on monitor.  Weaned down off of carvedilol.  Still having swelling and chest discomfort/smothering.  June 2021: EP follow-up with Dr. Curt Bears.  Discussed CRT-P =>   08/01/2020: Attempted BiV PPM (CRT-P) insertion, unable to place LV lead. ->  Referred to Dr. Lovena Le, seen on 09/04/2020: Schedule for reattempted CRT-P, if unable to place LV lead, would place His bundle pacing lead.  09/11/2020: LV lead insertion  Randall Best was last seen on 10/30/2020 via telemedicine.  Noted significant right leg swelling.  Mostly right ankle.  Weight was up to 240 pounds. ->  RLE ultrasound showed no evidence of DVT => recommended taking metolazone +80 mg Lasix in the morning and 40 mg in the evening x2 days, then 2 days  dose metolazone and Lasix without additional p.m. Lasix.  Was having issues with spironolactone, (gynecomastia), switched to eplerenone-however noted rash on his face and the perineum.. => Weight during this visit was 233 pounds.  Right leg more swollen than left, but better.  He does note that he is feeling better since the new pacemaker lead in place, but not all the way back.  Still sleeping on 3-4 pillows with orthopnea and smothering sensation.  But better since weight was down.  Targeted dry weight 223-225 pounds.  Recent Hospitalizations: None  Reviewed  CV studies:    The following studies were reviewed today: (if  available, images/films reviewed: From Epic Chart or Care Everywhere) . 10/15/2020 PFTs: Moderate restrictive airway disease was air trapping and mild diffusion deficit,.  This suggests emphysema, but no evidence  of overinflation of the lungs would argue against this.  Nothing to suggest amiodarone toxicity.  Interval History:   Randall Best returns here today still 238 pounds.  Of course he still knows smothering and off-and-on dry hacking cough.  He thinks his allergies may be causing the problem.  He is taking Zaroxolyn 3 times a week, but not taking extra Lasix.  He says the swelling is down and is wearing his compression socks, but still has significant orthopnea.  He sleeps pretty much upright.  He feels as abdomen is gotten larger, but his legs are not that bad.  Although he feels better since having the LV lead placed he still has significant swelling sensation and dyspnea.  Short of breath doing just about anything.  He has a significant lightheaded sensation last few minutes occurring maybe 1 or 2 times a week.  No real near syncope or syncope.  He does not have any chest pain or pressure with rest or exertion.  He does have intermittent PND and clearly has orthopnea.  Interestingly, he does not have any sensation of irregular heartbeats or palpitations.  No recurrent TIA or amaurosis fugax symptoms.  Cardiovascular ROS: positive for - dyspnea on exertion, edema, orthopnea, paroxysmal nocturnal dyspnea, shortness of breath and Occasional lightheadedness and dizziness; fatigue, weight gain negative for - chest pain, irregular heartbeat, palpitations, rapid heart rate or Syncope or near syncope, TIA/amaurosis fugax or claudication  The patient does not have symptoms concerning for COVID-19 infection (fever, chills, cough, or new shortness of breath).   REVIEWED OF SYSTEMS   Review of Systems  Constitutional: Positive for malaise/fatigue (Mostly because he limited by dyspnea.). Negative  for weight loss (His weight is definitely up.  Partly because he is not doing anything.).  HENT: Negative for congestion.   Respiratory: Positive for cough (Persistent dry hacking cough, comes and goes), shortness of breath and wheezing.   Cardiovascular: Positive for orthopnea (Sleeps on 3 pillows; has smothering sensation) and PND (Smothering sensation at night).  Gastrointestinal: Negative for abdominal pain, blood in stool and melena.       Mild increased abdominal girth  Genitourinary: Negative for hematuria.  Musculoskeletal: Positive for back pain. Negative for joint pain.  Neurological: Positive for dizziness (To 3 times a week). Negative for focal weakness and weakness.  Endo/Heme/Allergies: Positive for environmental allergies.  Psychiatric/Behavioral: Positive for depression (He is somewhat depressed with his lack of progression.). Negative for memory loss. The patient is nervous/anxious and has insomnia (Sleep sitting up.).    Fatigue slaughtering, cough, swelling  I have reviewed and (if needed) personally updated the patient's problem list, medications, allergies, past medical and surgical history, social and family history.   PAST MEDICAL HISTORY   Past Medical History:  Diagnosis Date  . Asthma    as child  . Atrial fibrillation, rapid ([place]) 12/08/2017   Diagnosed during colonoscopy -presumably new onset  . BBB (bundle branch block)   . GERD (gastroesophageal reflux disease)    use meds prn  . Glaucoma   . Headache    remote h/o migraines, none in years  . Hyperlipidemia   . NICM (nonischemic cardiomyopathy) ([place]) 04/2019   Echo 05/2019 - EF reduced to <20%.  Has permanent Afib & LBBB (normal Coronaries on Cath). -->  After cardioversion and titration medications, EF up to 35-40% with  global hypokinesis..  . Thoracic aortic aneurysm ([place]) 10/20/2019   Ascending thoracic aortic estimated 44 mm on echo  . TIA (transient  ischemic attack) 01/13/2018  . Ventral hernia      PAST SURGICAL HISTORY   Past Surgical History:  Procedure Laterality Date  . BIV PACEMAKER INSERTION CRT-P N/A 08/01/2020   Procedure: BIV PACEMAKER INSERTION CRT-P;  Surgeon: Constance Haw, MD;  Location: [place] CV [facility];  Service: Cardiovascular;  Laterality: N/A;  . CARDIAC EVENT MONITOR  12/2016   showed persistent Afib -monitor return in early because of persistent A. fib.  Several episodes of severe RVR with rates in the 170s were noted.   [name] CARDIOVERSION N/A 01/13/2018   Procedure: CARDIOVERSION;  Surgeon: Pixie Casino, MD;  Location[facility] ENDOSCOPY;  Service: Cardiovascular;  Laterality: N/A; unsuccessful.-->  Post-cardioversion, the patient had left-sided facial numbness and weakness.  Ruled out for stroke.  [name] CARDIOVERSION N/A 08/19/2019   Procedure: CARDIOVERSION;  Surgeon: Larey Dresser, MD;  Location: [facility] ENDOSCOPY;  Service: Cardiovascular;  Laterality: N/A;  . INSERTION OF MESH N/A 07/22/2017   Procedure: INSERTION OF MESH;  Surgeon: [name], MD;  Location: [place];  Service: General;  Laterality: N/A;  BILATERAL TAP BLOCK  . LEAD INSERTION N/A 09/11/2020   Procedure: LEAD INSERTION - LV LEAD;  Surgeon: Evans Lance, MD;  Location: [place] [facility];  Service: Cardiovascular;  Laterality: N/A;  . NM MYOVIEW LTD  12/2017   Shows reduced EF, but no evidence of ischemia or infarction.  EF 40 and 45%.  INTERMEDIATE RISK due to reduced function.  EF notably better on echo (50 and 55%)  . RIGHT/LEFT HEART CATH AND CORONARY ANGIOGRAPHY N/A 07/14/2019   Procedure: RIGHT/LEFT HEART CATH AND CORONARY ANGIOGRAPHY;  Surgeon: [name], MD;  Location: [place] [facility];; Angiographically normal coronary arteries. RHC  - PCWP & LVEDP 17 mmHg.  CO-CI 4.92-2.34  . TEE WITHOUT CARDIOVERSION N/A 01/13/2018   Procedure: TRANSESOPHAGEAL ECHOCARDIOGRAM (TEE) with cardioversion - ;  Surgeon: Pixie Casino, MD;  Location: [facility] ENDOSCOPY;  Service: Cardiovascular;   Laterality: N/A;  Mild concentric LVH.  EF 55 to 60%.  No R WMA.  Mild ascending aortic dilation of 4.2 cm.  Dilated left atrium.  No thrombus.  . TONSILLECTOMY    . TRANSTHORACIC ECHOCARDIOGRAM  10/11/2019   EF up to 35-40%.-Moderate-severely decreased function.  "GR 1 DD ".  Normal atrial sizes.  Moderate ascending aorta-44 mL.  Relatively normal valves.  . TRANSTHORACIC ECHOCARDIOGRAM  06/2019    Severely reduced function with diffuse hypokinesis and septal dyskinesis.  EF <20%.  Cannot exclude LV thrombus.  Reduced RV function with mildly elevated RV P--37 mmHg.  Moderate RA and mild LA dilation.  A centimeter dilation 42 mm  . VENTRAL HERNIA REPAIR N/A 07/22/2017   Procedure: OPEN VENTRAL HERNIA REPAIR WITH MESH ERAS PATHWAY;  Surgeon: [name], MD;  Location: [place];  Service: General;  Laterality: N/A;  BILATERAL TAP BLOCK    Immunization History  Administered Date(s) Administered  . Influenza, High Dose Seasonal PF 11/24/2017, 07/15/2019  . Influenza,inj,Quad PF,6+ Mos 08/04/2016  . Influenza-Unspecified 11/24/2017  . Pneumococcal Conjugate-13 07/27/2017    MEDICATIONS/ALLERGIES   Current Meds  Medication Sig  . acetaminophen (TYLENOL) 500 MG tablet Take 500 mg by mouth every 6 (six) hours as needed for moderate pain.   [name] albuterol (VENTOLIN HFA) 108 (90 Base) MCG/ACT inhaler Inhale 1 puff into the lungs every 4 (four) hours as needed for wheezing or shortness of breath.   [name] amiodarone (PACERONE) 200 MG tablet Take 1 tablet (200 mg  total) by mouth daily.  . benzonatate (TESSALON) 100 MG capsule Take 100 mg by mouth 3 (three) times daily as needed for cough.  . carvedilol (COREG) 3.125 MG tablet Take 3.125 mg by mouth 2 (two) times daily with a meal.   . Cholecalciferol (VITAMIN D) 50 MCG (2000 UT) tablet Take 2,000 Units by mouth daily.  . digoxin (LANOXIN) 0.125 MG tablet TAKE 1 TABLET BY MOUTH DAILY  . ENTRESTO 24-26 MG Take 1 tablet by mouth every morning.  .  furosemide (LASIX) 40 MG tablet TAKE 1 TABLET BY MOUTH 2 TIMES DAILY. MAY TAKE AN ADDITIONAL DOSE IF WT IS GREATER THAN 3 LBS DAILY.  . hydrocortisone (ANUSOL-HC) 25 MG suppository Place 25 mg rectally 2 (two) times daily as needed for hemorrhoids or anal itching.  . metolazone (ZAROXOLYN) 2.5 MG tablet TAKE 1 TABLET BY MOUTH EVERY MONDAY, WEDNESDAY, AND FRIDAY. [place] FUROSEMIDE DOSE  . Multiple Vitamins-Minerals (ULTRA MENS PACK PO) Take 1 tablet by mouth daily.  . nitroGLYCERIN (NITROSTAT) 0.4 MG SL tablet Place 1 tablet (0.4 mg total) under the tongue every 5 (five) minutes as needed for chest pain. (Patient taking differently: Place 0.4 mg under the tongue every 5 (five) minutes x 3 doses as needed for chest pain.)  . omeprazole (PRILOSEC) 20 MG capsule TAKE 1 CAPSULE BY MOUTH EVERY DAY  . potassium chloride SA (KLOR-CON) 20 MEQ tablet Take 20 mEq by mouth 2 (two) times daily.  . rosuvastatin (CRESTOR) 5 MG tablet Take 5 mg by mouth at bedtime.  . sacubitril-valsartan (ENTRESTO) 49-51 MG Take 1 tablet by mouth at bedtime. ,per Dr Ellyn Hack  . XARELTO 20 MG TABS tablet TAKE 1 TABLET (20 MG TOTAL) BY MOUTH DAILY WITH SUPPER.  . [DISCONTINUED] KLOR-CON M20 20 MEQ tablet TAKE 1 TABLET BY MOUTH DAILY. MAY TAKE AN ADDITIONAL TABLET IF EXTRA LASIX IS TAKEN AS NEEDED (Patient taking differently: Take 20 mEq by mouth daily.)  . [DISCONTINUED] rosuvastatin (CRESTOR) 5 MG tablet Take 1 tablet (5 mg total) by mouth daily. (Patient taking differently: Take 5 mg by mouth every evening.)    Allergies  Allergen Reactions  . Prilosec [Omeprazole] Nausea And Vomiting    If it is stronger dose  . Statins Other (See Comments)    Muscle pains, mouth sores  . Zetia [Ezetimibe] Other (See Comments)    Broke out in sores/ muscle pain  . Iodine Swelling and Rash  . Latex Rash    Bandages not gloves    SOCIAL HISTORY/FAMILY HISTORY   Reviewed in Epic:  Pertinent findings:  Social History    Tobacco Use  . Smoking status: Never Smoker  . Smokeless tobacco: Never Used  Vaping Use  . Vaping Use: Never used  Substance Use Topics  . Alcohol use: Yes    Comment: Rarely  . Drug use: No   Social History   Social History Narrative   He is a married father of 75, grandfather of 1.  Lives with his wife.   He does exercise regularly most days of the week for about 30 minutes.  He walks about 1-3 miles a day.  He will occasionally also walk on the treadmill.  He notes his energy is better if he remains active.    OBJCTIVE -PE, EKG, labs   Wt Readings from Last 3 Encounters:  01/31/21 238 lb 6.4 oz (108.1 kg)  10/19/20 233 lb 1.6 oz (105.7 kg)  09/11/20 239 lb (108.4 kg)  Physical Exam: BP 130/82   Pulse 69   Ht $R'5\' 9"'bT$  (1.753 m)   Wt 238 lb 6.4 oz (108.1 kg)   SpO2 94%   BMI 35.21 kg/m  Physical Exam Vitals reviewed.  Constitutional:      General: He is not in acute distress (No acute distress, but not comfortable).    Appearance: Normal appearance. He is obese. He is ill-appearing (Clearly looks as if he does not feel well, nontoxic.).  Neck:     Vascular: Hepatojugular reflux and JVD (8--10 cmH2O) present. No carotid bruit.  Cardiovascular:     Rate and Rhythm: Normal rate and regular rhythm.     Chest Wall: PMI is not displaced.     Pulses: Intact distal pulses. Decreased pulses (Decreased with palpable pedal pulses).     Heart sounds: S1 normal and S2 normal. Heart sounds are distant. No murmur (No obvious murmur.) heard. No friction rub. No gallop.   Pulmonary:     Effort: Pulmonary effort is normal. No respiratory distress (Not distressed, but notably more short of breath than usual.).     Breath sounds: No stridor. Wheezing present. No rales (No overt rales but mild basal crackles.).  Chest:     Chest wall: No tenderness.  Abdominal:     General: Bowel sounds are normal. There is no distension.     Palpations: Abdomen is soft.     Tenderness: There is  no abdominal tenderness.     Comments: Increase abdominal girth, no HSM  Musculoskeletal:        General: Swelling (Trivial-1+ bilateral LE) present.     Cervical back: Normal range of motion and neck supple.  Skin:    General: Skin is warm and dry.  Neurological:     General: No focal deficit present.     Mental Status: He is oriented to person, place, and time.     Cranial Nerves: No cranial nerve deficit.     Gait: Gait normal.  Psychiatric:        Mood and Affect: Mood normal.        Behavior: Behavior normal.        Thought Content: Thought content normal.        Judgment: Judgment normal.     Adult ECG Report N/a  Recent Labs: Checked today following visit.  Reviewed. Lab Results  Component Value Date   CHOL 203 (H) 01/31/2021   HDL 49 01/31/2021   LDLCALC 121 (H) 01/31/2021   TRIG 185 (H) 01/31/2021   CHOLHDL 4.1 01/31/2021   Lab Results  Component Value Date   CREATININE 1.24 01/31/2021   BUN 17 01/31/2021   NA 141 01/31/2021   K 4.5 01/31/2021   CL 100 01/31/2021   CO2 24 01/31/2021    CBC Latest Ref Rng & Units 01/31/2021 09/04/2020 07/30/2020  WBC 3.4 - 10.8 x10E3/uL 6.8 11.2(H) 8.3  Hemoglobin 13.0 - 17.7 g/dL 13.6 12.5(L) 12.4(L)  Hematocrit 37.5 - 51.0 % 41.6 38.1 38.6  Platelets 150 - 450 x10E3/uL 209 173 168    Lab Results  Component Value Date   TSH 1.560 01/31/2021    ==================================================  COVID-19 Education: The signs and symptoms of COVID-19 were discussed with the patient and how to seek care for testing (follow up with PCP or arrange E-visit).   The importance of social distancing and COVID-19 vaccination was discussed today. The patient is practicing social distancing & Masking.   I spent a total of 66minutes with  the patient spent in direct patient consultation.  Additional time spent with chart review  / charting (studies, outside notes, etc): 25 min Total Time: 55 min   Current medicines are  reviewed at length with the patient today.  (+/- concerns) n/a  This visit occurred during the SARS-CoV-2 public health emergency.  Safety protocols were in place, including screening questions prior to the visit, additional usage of staff PPE, and extensive cleaning of exam room while observing appropriate contact time as indicated for disinfecting solutions.  Notice: This dictation was prepared with Dragon dictation along with smaller phrase technology. Any transcriptional errors that result from this process are unintentional and may not be corrected upon review.  Patient Instructions / Medication Changes & Studies & Tests Ordered   Patient Instructions  Medication Instructions:    USE ALBUTEROL INHALER AS DISCUSSED WITH YOUR COUGH    FOR THE NEXT ONE WEEK TAKE 2 TABLETS OF  Furosemide DAILY EVEN ON THE DAYS YOU TAKE METOLAZONE , ON THE DAYS YOU DO NOT  TAKE YOUR METOLAZONE --  TAKE AN ADDITIONAL 20 MG FUROSEMIDE - THEN RETURN TO REGULAR DOSING   *If you need a refill on your cardiac medications before your next appointment, please call your pharmacy*   Lab Work: Today -- CBC, CMP, LIPID, TSH, DIGOXIN LEVEL   IN ONE WEEK - BMP   If you have labs (blood work) drawn today and your tests are completely normal, you will receive your results only by: [name] MyChart Message (if you have MyChart) OR . A paper copy in the mail If you have any lab test that is abnormal or we need to change your treatment, we will call you to review the results.   Testing/Procedures: Not needed   Follow-Up: At [facility], you and your health needs are our priority.  As part of our continuing mission to provide you with exceptional heart care, we have created designated Provider Care Teams.  These Care Teams include your primary Cardiologist (physician) and Advanced Practice Providers (APPs -  Physician Assistants and Nurse Practitioners) who all work together to provide you with the care you need, when you  need it.     Your next appointment:   2 month(s)  The format for your next appointment:   Virtual Visit   Provider:   [name], MD   Other Instructions   You have been referred to pulmonology-[facility]  FOR ABNORMAL PFT , COUGH    Studies Ordered:   Orders Placed This Encounter  Procedures  . Lipid panel  . Comprehensive metabolic panel  . CBC  . Digoxin level  . TSH  . Basic metabolic panel  . Ambulatory referral to Pulmonology     [name], M.D., M.S. Interventional Cardiologist   Pager # [phone] Phone # [phone] [address] [place], [place] [address]   Thank you for choosing Heartcare at [facility]!!

## 2021-02-02 ENCOUNTER — Encounter: Payer: Self-pay | Admitting: Cardiology

## 2021-02-02 LAB — LIPID PANEL
Chol/HDL Ratio: 4.1 ratio (ref 0.0–5.0)
Cholesterol, Total: 203 mg/dL — ABNORMAL HIGH (ref 100–199)
HDL: 49 mg/dL (ref >39)
LDL Chol Calc (NIH): 121 mg/dL — ABNORMAL HIGH (ref 0–99)
Triglycerides: 185 mg/dL — ABNORMAL HIGH (ref 0–149)
VLDL Cholesterol Cal: 33 mg/dL (ref 5–40)

## 2021-02-02 LAB — COMPREHENSIVE METABOLIC PANEL
ALT: 38 IU/L (ref 0–44)
AST: 29 IU/L (ref 0–40)
Albumin/Globulin Ratio: 1.7 (ref 1.2–2.2)
Albumin: 4.8 g/dL (ref 3.8–4.8)
Alkaline Phosphatase: 58 IU/L (ref 44–121)
BUN/Creatinine Ratio: 14 (ref 10–24)
BUN: 17 mg/dL (ref 8–27)
Bilirubin Total: 0.4 mg/dL (ref 0.0–1.2)
CO2: 24 mmol/L (ref 20–29)
Calcium: 9.7 mg/dL (ref 8.6–10.2)
Chloride: 100 mmol/L (ref 96–106)
Creatinine, Ser: 1.24 mg/dL (ref 0.76–1.27)
Globulin, Total: 2.8 g/dL (ref 1.5–4.5)
Glucose: 104 mg/dL — ABNORMAL HIGH (ref 65–99)
Potassium: 4.5 mmol/L (ref 3.5–5.2)
Sodium: 141 mmol/L (ref 134–144)
Total Protein: 7.6 g/dL (ref 6.0–8.5)
eGFR: 63 mL/min/{1.73_m2} (ref >59)

## 2021-02-02 LAB — DIGOXIN LEVEL: Digoxin, Serum: 0.6 ng/mL (ref 0.5–0.9)

## 2021-02-02 LAB — CBC
Hematocrit: 41.6 % (ref 37.5–51.0)
Hemoglobin: 13.6 g/dL (ref 13.0–17.7)
MCH: 29.3 pg (ref 26.6–33.0)
MCHC: 32.7 g/dL (ref 31.5–35.7)
MCV: 90 fL (ref 79–97)
Platelets: 209 10*3/uL (ref 150–450)
RBC: 4.64 x10E6/uL (ref 4.14–5.80)
RDW: 13.5 % (ref 11.6–15.4)
WBC: 6.8 10*3/uL (ref 3.4–10.8)

## 2021-02-02 LAB — TSH: TSH: 1.56 u[IU]/mL (ref 0.450–4.500)

## 2021-02-02 NOTE — Assessment & Plan Note (Signed)
Will be due for reassessment using CTA of the chest abdomen pelvis this year.

## 2021-02-02 NOTE — Assessment & Plan Note (Signed)
Switch to eplerenone from spironolactone.  This was tentatively stopped because of rash, but he now says he is back on it. => We may need to refill for him

## 2021-02-02 NOTE — Assessment & Plan Note (Addendum)
Clearly not euvolemic as his weight is back up again.  I do not think he fully understands the concept of sliding scale.  His weight is definitely up and therefore he is probably not taking extra Lasix.  I reiterated importance of daily weights and that his target weight is actually down to 225 if not all the way down to 223.  Before all this started he was in the 202-203 pound range.  He remains on stable regimen at this time:  Coreg 3.125 mg twice daily (we have not titrated further because of fatigue issues)  He is also on digoxin  Entresto-currently taking 24-26 mg in the morning and 49-51 mg p.m.=> Would like to titrate back up to full 49-51 mg daily, but has had dizziness.  He says he is actually back taking eplerenone.  We need to refill that for him.  He now is status post CRT-P with some improved symptoms.  He is currently taking 40 million Lasix twice daily but able to take extra doses PRN.   He also has metolazone 2.5 mg to take 3 days a week.    Plan was for sliding scale dosing of Lasix, but clearly that has not happened.

## 2021-02-02 NOTE — Assessment & Plan Note (Signed)
Labs checked today showed LDL 121.  Would like to be less than 100.' Currently only on low-dose rosuvastatin.  I want to get his heart failure cleared up, and then will probably try to titrate up to higher dose of rosuvastatin, I does not want those potential side effects of being confused.

## 2021-02-02 NOTE — Assessment & Plan Note (Signed)
In the past he thought this may be related to allergies/pollen, however I suspect there could be some mild cardiac asthma.  Next Plan: Suggested that he continue to use his albuterol, will refer to pulmonary medicine given results of PFTs.

## 2021-02-02 NOTE — Assessment & Plan Note (Signed)
Still trying to maintain sinus rhythm with amiodarone.  He is not able to tell me if he is or is not in A. fib.  Hopefully we can get this from the [facility] interrogation.  (I am not sure if the CRT-P PPM can provide OptiVol type of information of thoracic impedance) -> we will asked Dr. Curt Bears to provide assistance with this.  For now continue amiodarone and digoxin along with carvedilol.  We are attempting to maintain hythm control with amiodarone

## 2021-02-02 NOTE — Assessment & Plan Note (Signed)
I suspect that the reduced EF is related to A. fib RVR and LBBB.   his EF has improved some but has not been checked recently.  We will likely reassess echo, especially for consider right and left heart cath.

## 2021-02-02 NOTE — Assessment & Plan Note (Addendum)
I hope to get him down to 225 pounds last visit, the ultimate goal will be down into the 210-215 lb range, but we are now at 238 pounds.  He is quite sedentary now partly because of exertional dyspnea and Covid, he therefore may have gained some weight that is not volume related.  He has had CRT-P upgrade and return to maximize pacing with A. fib suppression using amiodarone.  Currently in sinus rhythm.   He is noting some wheezing, but I doubt that this is true pulmonary asthma, but however we will provide as needed albuterol  There was a suggestion of possible obstructive airway disease-we will refer to pulmonary medicine  FOR THE NEXT ONE WEEK TAKE 2 TABLETS OF  Furosemide DAILY EVEN ON THE DAYS YOU TAKE METOLAZONE , ON THE DAYS YOU DO NOT  TAKE YOUR METOLAZONE --  TAKE AN ADDITIONAL 20 MG FUROSEMIDE - THEN RETURN TO REGULAR DOSING   We will recheck labs first today and then after the 1 week of up titration of diuretic => if his response to the furosemide is not significant, we could would switch him to torsemide or bumetanide.  I will low threshold to consider right heart cath and possible admission for IV diuresis.  He was reluctant to go to the [facility]-sliding cost concerns.  Unfortunately, I really think he would benefit from close monitoring in the heart failure clinic and potential IV or subcu Lasix.

## 2021-02-02 NOTE — Assessment & Plan Note (Signed)
PFTs as well as ESR and CRP were normal.  Suggesting no pulmonary toxicity.  Interestingly, he did have signs of moderate obstructive disease.  I wonder if this is potentially cardiac asthma.  Thankfully, there was no evidence of pulmonary toxicity.  Follow LFTs and TFTs.  Also reminded him to pursue annual eye exam.

## 2021-02-02 NOTE — Progress Notes (Signed)
Cholesterol levels do look better than last time with a total Nexlizet of 203 and LDL 121.  Would like this to be down lower with LDL down to 100.  Significantly improved from a year ago.  LDL at that time was 213. Would like to see if midodrine can tolerate increasing rosuvastatin to 10 mg (2 tablets at least 3 days a week).  Chemistry panel shows borderline elevated blood sugar levels, but otherwise normal chemistry panel with normal kidney and liver function.  Normal sodium, potassium, calcium etc..  Blood count actually shows improved hemoglobin level from 5 months ago.  Reduced white count from 5 months ago which is good.  All in the normal range.  Normal platelet levels.  Digoxin was last reported lab, normal at level of 0.6.  This is low normal.   Overall, labs are pretty stable.  We will see how things look after diuresis.  If were not able to get weight down by next visit, I think we need to consider a more aggressive stance as far as evaluation with right and left heart cath (a minimum right heart cath) and possible short-term admission for IV diuresis.  [name], MD

## 2021-02-08 LAB — BASIC METABOLIC PANEL
BUN/Creatinine Ratio: 17 (ref 10–24)
BUN: 20 mg/dL (ref 8–27)
CO2: 25 mmol/L (ref 20–29)
Calcium: 9 mg/dL (ref 8.6–10.2)
Chloride: 100 mmol/L (ref 96–106)
Creatinine, Ser: 1.19 mg/dL (ref 0.76–1.27)
Glucose: 107 mg/dL — ABNORMAL HIGH (ref 65–99)
Potassium: 4.4 mmol/L (ref 3.5–5.2)
Sodium: 139 mmol/L (ref 134–144)
eGFR: 66 mL/min/{1.73_m2} (ref >59)

## 2021-02-11 NOTE — Progress Notes (Signed)
Remote pacemaker transmission.   

## 2021-03-01 ENCOUNTER — Other Ambulatory Visit: Payer: Self-pay

## 2021-03-01 ENCOUNTER — Ambulatory Visit (INDEPENDENT_AMBULATORY_CARE_PROVIDER_SITE_OTHER): Payer: 59 | Admitting: Internal Medicine

## 2021-03-01 ENCOUNTER — Encounter: Payer: Self-pay | Admitting: Internal Medicine

## 2021-03-01 VITALS — BP 138/72 | HR 70 | Ht 69.0 in | Wt 241.0 lb

## 2021-03-01 DIAGNOSIS — Z95810 Presence of automatic (implantable) cardiac defibrillator: Secondary | ICD-10-CM | POA: Diagnosis not present

## 2021-03-01 DIAGNOSIS — I4819 Other persistent atrial fibrillation: Secondary | ICD-10-CM

## 2021-03-01 NOTE — Progress Notes (Signed)
HPI [name] returns today for followup. He is a pleasant 70 yo man with a h/o non-ischemic CM, chronic systolic heart failure, s/p BIV PM upgrade. He has done well in the interim. He is still working out daily but does have pnd and admits to dietary indiscretion. He has had a gradual increase in his diuretic dose. He denies peripheral edema. He has not had syncope.  Allergies  Allergen Reactions  . Prilosec [Omeprazole] Nausea And Vomiting    If it is stronger dose  . Statins Other (See Comments)    Muscle pains, mouth sores  . Zetia [Ezetimibe] Other (See Comments)    Broke out in sores/ muscle pain  . Iodine Swelling and Rash  . Latex Rash    Bandages not gloves     Current Outpatient Medications  Medication Sig Dispense Refill  . acetaminophen (TYLENOL) 500 MG tablet Take 500 mg by mouth every 6 (six) hours as needed for moderate pain.     [name] albuterol (VENTOLIN HFA) 108 (90 Base) MCG/ACT inhaler Inhale 1 puff into the lungs every 4 (four) hours as needed for wheezing or shortness of breath.     [name] amiodarone (PACERONE) 200 MG tablet Take 1 tablet (200 mg total) by mouth daily. 90 tablet 3  . benzonatate (TESSALON) 100 MG capsule Take 100 mg by mouth 3 (three) times daily as needed for cough.    . carvedilol (COREG) 3.125 MG tablet Take 3.125 mg by mouth 2 (two) times daily with a meal.     . Cholecalciferol (VITAMIN D) 50 MCG (2000 UT) tablet Take 2,000 Units by mouth daily.    . digoxin (LANOXIN) 0.125 MG tablet TAKE 1 TABLET BY MOUTH DAILY 90 tablet 1  . ENTRESTO 24-26 MG Take 1 tablet by mouth every morning. 60 tablet 5  . furosemide (LASIX) 40 MG tablet TAKE 1 TABLET BY MOUTH 2 TIMES DAILY. MAY TAKE AN ADDITIONAL DOSE IF WT IS GREATER THAN 3 LBS DAILY. 270 tablet 3  . hydrocortisone (ANUSOL-HC) 25 MG suppository Place 25 mg rectally 2 (two) times daily as needed for hemorrhoids or anal itching.    . metolazone (ZAROXOLYN) 2.5 MG tablet TAKE 1 TABLET BY MOUTH EVERY  MONDAY, WEDNESDAY, AND FRIDAY. 30 MINUTES PRIOR TO YOUR FUROSEMIDE DOSE 45 tablet 3  . Multiple Vitamins-Minerals (ULTRA MENS PACK PO) Take 1 tablet by mouth daily.    . nitroGLYCERIN (NITROSTAT) 0.4 MG SL tablet Place 1 tablet (0.4 mg total) under the tongue every 5 (five) minutes as needed for chest pain. 25 tablet 3  . Omega-3 Fatty Acids (FISH OIL) 1200 MG CAPS Take 1,200 mg by mouth every other day.    [name] omeprazole (PRILOSEC) 20 MG capsule TAKE 1 CAPSULE BY MOUTH EVERY DAY 90 capsule 3  . potassium chloride SA (KLOR-CON) 20 MEQ tablet Take 20 mEq by mouth 2 (two) times daily.    . rosuvastatin (CRESTOR) 5 MG tablet Take 5 mg by mouth at bedtime.    . sacubitril-valsartan (ENTRESTO) 49-51 MG Take 1 tablet by mouth at bedtime. ,per Dr Ellyn Hack    . XARELTO 20 MG TABS tablet TAKE 1 TABLET (20 MG TOTAL) BY MOUTH DAILY WITH SUPPER. 90 tablet 1   No current facility-administered medications for this visit.     Past Medical History:  Diagnosis Date  . Asthma    as child  . Atrial fibrillation, rapid ([place]) 12/08/2017   Diagnosed during colonoscopy -presumably new onset  .  BBB (bundle branch block)   . GERD (gastroesophageal reflux disease)    use meds prn  . Glaucoma   . Headache    remote h/o migraines, none in years  . Hyperlipidemia   . NICM (nonischemic cardiomyopathy) ([place]) 04/2019   Echo 05/2019 - EF reduced to <20%.  Has permanent Afib & LBBB (normal Coronaries on Cath). -->  After cardioversion and titration medications, EF up to 35-40% with  global hypokinesis..  . Thoracic aortic aneurysm ([place]) 10/20/2019   Ascending thoracic aortic estimated 44 mm on echo  . TIA (transient ischemic attack) 01/13/2018  . Ventral hernia     ROS:   All systems reviewed and negative except as noted in the HPI.   Past Surgical History:  Procedure Laterality Date  . BIV PACEMAKER INSERTION CRT-P N/A 08/01/2020   Procedure: BIV PACEMAKER INSERTION CRT-P;  Surgeon: Constance Haw, MD;   Location: [facility];  Service: Cardiovascular;  Laterality: N/A;  . CARDIAC EVENT MONITOR  12/2016   showed persistent Afib -monitor return in early because of persistent A. fib.  Several episodes of severe RVR with rates in the 170s were noted.   [name] CARDIOVERSION N/A 01/13/2018   Procedure: CARDIOVERSION;  Surgeon: Pixie Casino, MD;  [facility] ENDOSCOPY;  Service: Cardiovascular;  Laterality: N/A; unsuccessful.-->  Post-cardioversion, the patient had left-sided facial numbness and weakness.  Ruled out for stroke.  [name] CARDIOVERSION N/A 08/19/2019   Procedure: CARDIOVERSION;  Surgeon: Larey Dresser, MD;  Location: [facility] ENDOSCOPY;  Service: Cardiovascular;  Laterality: N/A;  . INSERTION OF MESH N/A 07/22/2017   Procedure: INSERTION OF MESH;  Surgeon: [name], MD;  Location: [facility];  Service: General;  Laterality: N/A;  BILATERAL TAP BLOCK  . LEAD INSERTION N/A 09/11/2020   Procedure: LEAD INSERTION - LV LEAD;  Surgeon: Evans Lance, MD;  Location: [facility];  Service: Cardiovascular;  Laterality: N/A;  . NM MYOVIEW LTD  12/2017   Shows reduced EF, but no evidence of ischemia or infarction.  EF 40 and 45%.  INTERMEDIATE RISK due to reduced function.  EF notably better on echo (50 and 55%)  . RIGHT/LEFT HEART CATH AND CORONARY ANGIOGRAPHY N/A 07/14/2019   Procedure: RIGHT/LEFT HEART CATH AND CORONARY ANGIOGRAPHY;  Surgeon: [name], MD;  Location: [facility];; Angiographically normal coronary arteries. RHC  - PCWP & LVEDP 17 mmHg.  CO-CI 4.92-2.34  . TEE WITHOUT CARDIOVERSION N/A 01/13/2018   Procedure: TRANSESOPHAGEAL ECHOCARDIOGRAM (TEE) with cardioversion - ;  Surgeon: Pixie Casino, MD;  Location: [facility] ENDOSCOPY;  Service: Cardiovascular;  Laterality: N/A;  Mild concentric LVH.  EF 55 to 60%.  No R WMA.  Mild ascending aortic dilation of 4.2 cm.  Dilated left atrium.  No thrombus.  . TONSILLECTOMY    . TRANSTHORACIC ECHOCARDIOGRAM  10/11/2019   EF up to  35-40%.-Moderate-severely decreased function.  "GR 1 DD ".  Normal atrial sizes.  Moderate ascending aorta-44 mL.  Relatively normal valves.  . TRANSTHORACIC ECHOCARDIOGRAM  06/2019    Severely reduced function with diffuse hypokinesis and septal dyskinesis.  EF <20%.  Cannot exclude LV thrombus.  Reduced RV function with mildly elevated RV P--37 mmHg.  Moderate RA and mild LA dilation.  A centimeter dilation 42 mm  . VENTRAL HERNIA REPAIR N/A 07/22/2017   Procedure: OPEN VENTRAL HERNIA REPAIR WITH MESH ERAS PATHWAY;  Surgeon: [name], MD;  Location: [facility];  Service: General;  Laterality: N/A;  BILATERAL TAP BLOCK     Family History  Problem Relation Age of Onset  . Diabetes Mother        Died at 38  . Heart failure Father        03/22/2023 have had A. fib, died at 77  . Hypertension Brother        He is in his 40s  . CVA Maternal Grandmother        7s  . Colon cancer Neg Hx   . Esophageal cancer Neg Hx   . Rectal cancer Neg Hx   . Stomach cancer Neg Hx      Social History   Socioeconomic History  . Marital status: Married    Spouse name: Not on file  . Number of children: Not on file  . Years of education: 4  . Highest education level: High school graduate  Occupational History  . Occupation: Retired  Tobacco Use  . Smoking status: Never Smoker  . Smokeless tobacco: Never Used  Vaping Use  . Vaping Use: Never used  Substance and Sexual Activity  . Alcohol use: Yes    Comment: Rarely  . Drug use: No  . Sexual activity: Not on file  Other Topics Concern  . Not on file  Social History Narrative   He is a married father of 55, grandfather of 1.  Lives with his wife.   He does exercise regularly most days of the week for about 30 minutes.  He walks about 1-3 miles a day.  He will occasionally also walk on the treadmill.  He notes his energy is better if he remains active.   Social Determinants of Health   Financial Resource Strain: Not on file  Food Insecurity:  Not on file  Transportation Needs: Not on file  Physical Activity: Not on file  Stress: Not on file  Social Connections: Not on file  Intimate Partner Violence: Not on file     BP 138/72 (BP Location: Left Arm, Patient Position: Sitting, Cuff Size: Normal)   Pulse 70   Ht 5\' 9"  (1.753 m)   Wt 241 lb (109.3 kg)   SpO2 97%   BMI 35.59 kg/m   Physical Exam:  Well appearing 70 yo man, NAD HEENT: Unremarkable Neck:  N76 cmJVD, no thyromegally Lymphatics:  No adenopathy Back:  No CVA tenderness Lungs:  Clear with no wheezes HEART:  Regular rate rhythm, no murmurs, no rubs, no clicks Abd:  soft, positive bowel sounds, no organomegally, no rebound, no guarding Ext:  2 plus pulses, no edema, no cyanosis, no clubbing Skin:  No rashes no nodules Neuro:  CN II through XII intact, motor grossly intact  EKG - nsr with biv pacing  DEVICE  Normal device function.  See PaceArt for details.   Assess/Plan: 1. Chronic systolic heart failure - he is s/p biv upgrade. His symptoms are class 2. He is encouraged to avoid salty foods.  2. BIV PPM - he has an LV lead in the posterolateral vein and his RV lead is placed in the left bundle area. We left him programmed today biv pacing turned on.  3. Obesity - he is encouraged to lose weight. 4. PAF - he will continue with amiodarone 200 mg daily.  Randall Overlie Rayona Sardinha,MD

## 2021-03-01 NOTE — Patient Instructions (Signed)
Medication Instructions:  Your physician recommends that you continue on your current medications as directed. Please refer to the Current Medication list given to you today.  Labwork: None ordered.  Testing/Procedures: None ordered.  Follow-Up: Your physician wants you to follow-up in: one year with Dr. Curt Bears or one of the following Advanced Practice Providers on your designated Care Team:    Chanetta Marshall, NP  Tommye Standard, PA-C  Legrand Como "Jonni Sanger" [place], [place]  Remote monitoring is used to monitor your Pacemaker from home. This monitoring reduces the number of office visits required to check your device to one time per year. It allows [place] to keep an eye on the functioning of your device to ensure it is working properly. You are scheduled for a device check from home on 05/02/2021. You may send your transmission at any time that day. If you have a wireless device, the transmission will be sent automatically. After your physician reviews your transmission, you will receive a postcard with your next transmission date.  Any Other Special Instructions Will Be Listed Below (If Applicable).  If you need a refill on your cardiac medications before your next appointment, please call your pharmacy.

## 2021-03-03 ENCOUNTER — Other Ambulatory Visit: Payer: Self-pay | Admitting: Cardiology

## 2021-03-05 ENCOUNTER — Ambulatory Visit (INDEPENDENT_AMBULATORY_CARE_PROVIDER_SITE_OTHER): Payer: 59

## 2021-03-05 ENCOUNTER — Other Ambulatory Visit (INDEPENDENT_AMBULATORY_CARE_PROVIDER_SITE_OTHER): Payer: 59

## 2021-03-05 ENCOUNTER — Encounter: Payer: Self-pay | Admitting: Pulmonary Disease

## 2021-03-05 ENCOUNTER — Other Ambulatory Visit: Payer: Self-pay

## 2021-03-05 ENCOUNTER — Ambulatory Visit (INDEPENDENT_AMBULATORY_CARE_PROVIDER_SITE_OTHER): Payer: 59 | Admitting: Pulmonary Disease

## 2021-03-05 VITALS — BP 130/70 | HR 69 | Ht 69.0 in | Wt 233.0 lb

## 2021-03-05 DIAGNOSIS — R0602 Shortness of breath: Secondary | ICD-10-CM

## 2021-03-05 DIAGNOSIS — J449 Chronic obstructive pulmonary disease, unspecified: Secondary | ICD-10-CM

## 2021-03-05 LAB — CBC WITH DIFFERENTIAL/PLATELET
Basophils Absolute: 0.1 10*3/uL (ref 0.0–0.1)
Basophils Relative: 0.7 % (ref 0.0–3.0)
Eosinophils Absolute: 0.2 10*3/uL (ref 0.0–0.7)
Eosinophils Relative: 2 % (ref 0.0–5.0)
HCT: 41.4 % (ref 39.0–52.0)
Hemoglobin: 13.8 g/dL (ref 13.0–17.0)
Lymphocytes Relative: 28.8 % (ref 12.0–46.0)
Lymphs Abs: 2.1 10*3/uL (ref 0.7–4.0)
MCHC: 33.4 g/dL (ref 30.0–36.0)
MCV: 88.1 fl (ref 78.0–100.0)
Monocytes Absolute: 0.6 10*3/uL (ref 0.1–1.0)
Monocytes Relative: 8.2 % (ref 3.0–12.0)
Neutro Abs: 4.5 10*3/uL (ref 1.4–7.7)
Neutrophils Relative %: 60.3 % (ref 43.0–77.0)
Platelets: 194 10*3/uL (ref 150.0–400.0)
RBC: 4.7 Mil/uL (ref 4.22–5.81)
RDW: 14.8 % (ref 11.5–15.5)
WBC: 7.4 10*3/uL (ref 4.0–10.5)

## 2021-03-05 MED ORDER — BUDESONIDE-FORMOTEROL FUMARATE 160-4.5 MCG/ACT IN AERO: 2.0000 | INHALATION_SPRAY | Freq: Two times a day (BID) | RESPIRATORY_TRACT | 5 refills | Status: DC

## 2021-03-05 NOTE — Patient Instructions (Signed)
We will check some labs today including CBC differential, IgE Started on an inhaler called Symbicort 160.  Take 2 puffs twice daily We will get a chest x-ray today  Follow-up in 3 months.

## 2021-03-05 NOTE — Progress Notes (Signed)
Randall Best    [id number]    November 18, 1950  Primary Care Physician:Masneri, [name], Randall Best  Referring Physician: Leonie Best, Randall Best,  Randall Best [id number]  Chief complaint: Consult for dyspnea  HPI: 70 year old with history of childhood asthma, significant cardiac issues including nonischemic cardiomyopathy, chronic systolic heart failure status post BiV ICD, atrial fibrillation  Complains of dyspnea that occurs mostly at night.  He has a smoking sensation when he lays down.  Minimal symptoms during the daytime.  He has minimal wheezing, lower extremity edema.  Denies any cough, sputum production  Has history of childhood asthma and is maintained on albuterol which he takes several times a month He does not have trouble sleeping, denies any snoring.  As per the patient he had a sleep study in 2021 at [facility] and was told he does not have sleep apnea.  Pets: 2 cats Occupation: Worked in [employer], research (medical).  Currently retired Exposures: No mold, hot tub, Jacuzzi.  No feather pillows or comforter Smoking history: Never smoker Travel history: Previously lived in [place], [place].  No significant recent travel Relevant family history: Aunt died of lung cancer, she is a non-smoker.  Outpatient Encounter Medications as of 03/05/2021  Medication Sig  . acetaminophen (TYLENOL) 500 MG tablet Take 500 mg by mouth every 6 (six) hours as needed for moderate pain.   [name] albuterol (VENTOLIN HFA) 108 (90 Base) MCG/ACT inhaler Inhale 1 puff into the lungs every 4 (four) hours as needed for wheezing or shortness of breath.   [name] amiodarone (PACERONE) 200 MG tablet Take 1 tablet (200 mg total) by mouth daily.  . benzonatate (TESSALON) 100 MG capsule Take 100 mg by mouth 3 (three) times daily as needed for cough.  . carvedilol (COREG) 3.125 MG tablet Take 3.125 mg by mouth 2 (two) times daily with a meal.   . Cholecalciferol (VITAMIN D) 50 MCG (2000 UT) tablet Take  2,000 Units by mouth daily.  . digoxin (LANOXIN) 0.125 MG tablet TAKE 1 TABLET BY MOUTH DAILY  . ENTRESTO 24-26 MG Take 1 tablet by mouth every morning.  . furosemide (LASIX) 40 MG tablet TAKE 1 TABLET BY MOUTH 2 TIMES DAILY. MAY TAKE AN ADDITIONAL DOSE IF WT IS GREATER THAN 3 LBS DAILY.  . hydrocortisone (ANUSOL-HC) 25 MG suppository Place 25 mg rectally 2 (two) times daily as needed for hemorrhoids or anal itching.  . metolazone (ZAROXOLYN) 2.5 MG tablet TAKE 1 TABLET BY MOUTH EVERY MONDAY, WEDNESDAY, AND FRIDAY. [place] FUROSEMIDE DOSE  . Multiple Vitamins-Minerals (ULTRA MENS PACK PO) Take 1 tablet by mouth daily.  . nitroGLYCERIN (NITROSTAT) 0.4 MG SL tablet Place 1 tablet (0.4 mg total) under the tongue every 5 (five) minutes as needed for chest pain.  . Omega-3 Fatty Acids (FISH OIL) 1200 MG CAPS Take 1,200 mg by mouth every other day.  [name] omeprazole (PRILOSEC) 20 MG capsule TAKE 1 CAPSULE BY MOUTH EVERY DAY  . potassium chloride SA (KLOR-CON) 20 MEQ tablet Take 20 mEq by mouth 2 (two) times daily.  . rosuvastatin (CRESTOR) 5 MG tablet Take 1 tablet (5 mg total) by mouth at bedtime.  . sacubitril-valsartan (ENTRESTO) 49-51 MG Take 1 tablet by mouth at bedtime. ,per Dr Ellyn Hack  . XARELTO 20 MG TABS tablet TAKE 1 TABLET (20 MG TOTAL) BY MOUTH DAILY WITH SUPPER.   No facility-administered encounter medications on file as of 03/05/2021.  Allergies as of 03/05/2021 - Review Complete 03/05/2021  Allergen Reaction Noted  . Prilosec [omeprazole] Nausea And Vomiting 07/16/2017  . Statins Other (See Comments) 07/16/2017  . Zetia [ezetimibe] Other (See Comments) 07/16/2017  . Iodine Swelling and Rash 05/24/2020  . Latex Rash 05/24/2020    Past Medical History:  Diagnosis Date  . Asthma    as child  . Atrial fibrillation, rapid ([place]) 12/08/2017   Diagnosed during colonoscopy -presumably new onset  . BBB (bundle branch block)   . GERD (gastroesophageal reflux disease)     use meds prn  . Glaucoma   . Headache    remote h/o migraines, none in years  . Hyperlipidemia   . NICM (nonischemic cardiomyopathy) ([place]) 04/2019   Echo 05/2019 - EF reduced to <20%.  Has permanent Afib & LBBB (normal Coronaries on Cath). -->  After cardioversion and titration medications, EF up to 35-40% with  global hypokinesis..  . Thoracic aortic aneurysm ([place]) 10/20/2019   Ascending thoracic aortic estimated 44 mm on echo  . TIA (transient ischemic attack) 01/13/2018  . Ventral hernia     Past Surgical History:  Procedure Laterality Date  . BIV PACEMAKER INSERTION CRT-P N/A 08/01/2020   Procedure: BIV PACEMAKER INSERTION CRT-P;  Surgeon: Constance Haw, MD;  Location: [place] [facility];  Service: Cardiovascular;  Laterality: N/A;  . CARDIAC EVENT MONITOR  12/2016   showed persistent Afib -monitor return in early because of persistent A. fib.  Several episodes of severe RVR with rates in the 170s were noted.   [name] CARDIOVERSION N/A 01/13/2018   Procedure: CARDIOVERSION;  Surgeon: Pixie Casino, MD;  Location: [facility] ENDOSCOPY;  Service: Cardiovascular;  Laterality: N/A; unsuccessful.-->  Post-cardioversion, the patient had left-sided facial numbness and weakness.  Ruled out for stroke.  [name] CARDIOVERSION N/A 08/19/2019   Procedure: CARDIOVERSION;  Surgeon: Larey Dresser, MD;  Location: [facility] ENDOSCOPY;  Service: Cardiovascular;  Laterality: N/A;  . INSERTION OF MESH N/A 07/22/2017   Procedure: INSERTION OF MESH;  Surgeon: [name], MD;  Location: [place];  Service: General;  Laterality: N/A;  BILATERAL TAP BLOCK  . LEAD INSERTION N/A 09/11/2020   Procedure: LEAD INSERTION - LV LEAD;  Surgeon: Evans Lance, MD;  Location: [place] [facility];  Service: Cardiovascular;  Laterality: N/A;  . NM MYOVIEW LTD  12/2017   Shows reduced EF, but no evidence of ischemia or infarction.  EF 40 and 45%.  INTERMEDIATE RISK due to reduced function.  EF notably better on echo (50 and 55%)  .  RIGHT/LEFT HEART CATH AND CORONARY ANGIOGRAPHY N/A 07/14/2019   Procedure: RIGHT/LEFT HEART CATH AND CORONARY ANGIOGRAPHY;  Surgeon: Randall Man, MD;  Location: [place] [facility];; Angiographically normal coronary arteries. RHC  - PCWP & LVEDP 17 mmHg.  CO-CI 4.92-2.34  . TEE WITHOUT CARDIOVERSION N/A 01/13/2018   Procedure: TRANSESOPHAGEAL ECHOCARDIOGRAM (TEE) with cardioversion - ;  Surgeon: Pixie Casino, MD;  Location: [facility] ENDOSCOPY;  Service: Cardiovascular;  Laterality: N/A;  Mild concentric LVH.  EF 55 to 60%.  No R WMA.  Mild ascending aortic dilation of 4.2 cm.  Dilated left atrium.  No thrombus.  . TONSILLECTOMY    . TRANSTHORACIC ECHOCARDIOGRAM  10/11/2019   EF up to 35-40%.-Moderate-severely decreased function.  "GR 1 DD ".  Normal atrial sizes.  Moderate ascending aorta-44 mL.  Relatively normal valves.  . TRANSTHORACIC ECHOCARDIOGRAM  06/2019    Severely reduced function with diffuse hypokinesis and septal dyskinesis.  EF <20%.  Cannot exclude LV thrombus.  Reduced RV function with mildly elevated RV P--37 mmHg.  Moderate RA and mild LA dilation.  A centimeter dilation 42 mm  . VENTRAL HERNIA REPAIR N/A 07/22/2017   Procedure: OPEN VENTRAL HERNIA REPAIR WITH MESH ERAS PATHWAY;  Surgeon: [name], MD;  Location: [place];  Service: General;  Laterality: N/A;  BILATERAL TAP BLOCK    Family History  Problem Relation Age of Onset  . Diabetes Mother        Died at 59  . Heart failure Father        Apr 10, 2023 have had A. fib, died at 106  . Hypertension Brother        He is in his 79s  . CVA Maternal Grandmother        81s  . Colon cancer Neg Hx   . Esophageal cancer Neg Hx   . Rectal cancer Neg Hx   . Stomach cancer Neg Hx     Social History   Socioeconomic History  . Marital status: Married    Spouse name: Not on file  . Number of children: Not on file  . Years of education: 68  . Highest education level: High school graduate  Occupational History  . Occupation:  Retired  Tobacco Use  . Smoking status: Never Smoker  . Smokeless tobacco: Never Used  Vaping Use  . Vaping Use: Never used  Substance and Sexual Activity  . Alcohol use: Yes    Comment: Rarely  . Drug use: No  . Sexual activity: Not on file  Other Topics Concern  . Not on file  Social History Narrative   He is a married father of 38, grandfather of 1.  Lives with his wife.   He does exercise regularly most days of the week for about 30 minutes.  He walks about 1-3 miles a day.  He will occasionally also walk on the treadmill.  He notes his energy is better if he remains active.   Social Determinants of Health   Financial Resource Strain: Not on file  Food Insecurity: Not on file  Transportation Needs: Not on file  Physical Activity: Not on file  Stress: Not on file  Social Connections: Not on file  Intimate Partner Violence: Not on file    Review of systems: Review of Systems  Constitutional: Negative for fever and chills.  HENT: Negative.   Eyes: Negative for blurred vision.  Respiratory: as per HPI  Cardiovascular: Negative for chest pain and palpitations.  Gastrointestinal: Negative for vomiting, diarrhea, blood per rectum. Genitourinary: Negative for dysuria, urgency, frequency and hematuria.  Musculoskeletal: Negative for myalgias, back pain and joint pain.  Skin: Negative for itching and rash.  Neurological: Negative for dizziness, tremors, focal weakness, seizures and loss of consciousness.  Endo/Heme/Allergies: Negative for environmental allergies.  Psychiatric/Behavioral: Negative for depression, suicidal ideas and hallucinations.  All other systems reviewed and are negative.  Physical Exam: Blood pressure 130/70, pulse 69, height 5\' 9"  (1.753 m), weight 233 lb (105.7 kg), SpO2 96 %. Gen:      No acute distress HEENT:  EOMI, sclera anicteric Neck:     No masses; no thyromegaly Lungs:    Clear to auscultation bilaterally; normal respiratory effort CV:          Regular rate and rhythm; no murmurs Abd:      + bowel sounds; soft, non-tender; no palpable masses, no distension Ext:    No edema; adequate peripheral perfusion  Skin:      Warm and dry; no rash Neuro: alert and oriented x 3 Psych: normal mood and affect  Data Reviewed: Imaging: CT abdomen pelvis 08/04/2017- streaky lung base atelectasis.  No evidence of ILD in the visualized lung base. Chest x-ray 09/11/2020- no airspace disease or lung abnormality. I have reviewed the images personally.  PFTs: 10/15/20 FVC 2.76 (60%), FEV1 2.11 [64%], F/F 77, TLC 6.52 [95%], DLCO 17.34 [68%] Mild diffusion defect  Labs: CBC 09/04/2020-WBC 11.2, eos 3%, absolute eosinophil count 336  Assessment:  Asthma Evaluation for dyspnea He has history of childhood asthma but symptoms are not typical for airway disease Given presentation with orthopnea I suspect dyspnea secondary to CHF  PFTs reviewed with mild diffusion impairment.  He has normal lung volumes and no evidence of ILD on chest imaging We will reevaluate with CBC, IgE and BNP Get chest x-ray today  Trial Symbicort inhaler  Plan/Recommendations: CBC, IgE, BNP Chest x-ray Symbicort  Marshell Garfinkel MD [facility] 03/05/2021, 10:49 AM  CC: Randall Man, MD

## 2021-03-05 NOTE — Addendum Note (Signed)
Addended by: Maren Beach, Johnisha Louks A on: 03/05/2021 09:11 AM   Modules accepted: Orders

## 2021-03-06 LAB — PRO B NATRIURETIC PEPTIDE: NT-Pro BNP: 22 pg/mL (ref 0–376)

## 2021-03-06 LAB — IGE: IgE (Immunoglobulin E), Serum: 13 kU/L (ref <=114)

## 2021-03-13 ENCOUNTER — Encounter: Payer: Self-pay | Admitting: *Deleted

## 2021-03-20 ENCOUNTER — Encounter: Payer: Self-pay | Admitting: Cardiology

## 2021-03-20 ENCOUNTER — Telehealth: Payer: Self-pay | Admitting: *Deleted

## 2021-03-20 ENCOUNTER — Telehealth (INDEPENDENT_AMBULATORY_CARE_PROVIDER_SITE_OTHER): Payer: 59 | Admitting: Cardiology

## 2021-03-20 VITALS — BP 128/83 | HR 73 | Ht 69.0 in | Wt 230.0 lb

## 2021-03-20 DIAGNOSIS — Z7901 Long term (current) use of anticoagulants: Secondary | ICD-10-CM

## 2021-03-20 DIAGNOSIS — I712 Thoracic aortic aneurysm, without rupture, unspecified: Secondary | ICD-10-CM

## 2021-03-20 DIAGNOSIS — Z79899 Other long term (current) drug therapy: Secondary | ICD-10-CM

## 2021-03-20 DIAGNOSIS — I5042 Chronic combined systolic (congestive) and diastolic (congestive) heart failure: Secondary | ICD-10-CM

## 2021-03-20 DIAGNOSIS — I420 Dilated cardiomyopathy: Secondary | ICD-10-CM

## 2021-03-20 DIAGNOSIS — I4819 Other persistent atrial fibrillation: Secondary | ICD-10-CM

## 2021-03-20 DIAGNOSIS — I495 Sick sinus syndrome: Secondary | ICD-10-CM

## 2021-03-20 DIAGNOSIS — E785 Hyperlipidemia, unspecified: Secondary | ICD-10-CM

## 2021-03-20 NOTE — Patient Instructions (Addendum)
Medication Instructions:   NO changes  *If you need a refill on your cardiac medications before your next appointment, please call your pharmacy*   Lab Work:  [facility] in Late June - Early July (prior to appointment with [facility])  If you have labs (blood work) drawn today and your tests are completely normal, you will receive your results only by: [name] MyChart Message (if you have MyChart) OR . A paper copy in the mail If you have any lab test that is abnormal or we need to change your treatment, we will call you to review the results.   Testing/Procedures: Echocardiogram (in December) -- to reassess Heart Function & Thoracic Aorta dilation.   Follow-Up: At [facility], you and your health needs are our priority.  As part of our continuing mission to provide you with exceptional heart care, we have created designated Provider Care Teams.  These Care Teams include your primary Cardiologist (physician) and Advanced Practice Providers (APPs -  Physician Assistants and Nurse Practitioners) who all work together to provide you with the care you need, when you need it.    Your next appointment:   4-5 month(s)  The format for your next appointment:   Virtual Visit   Provider:   [name], MD   Other Instructions Keep working on weight loss.  We will work on the [employer] prescription issue.  Your physician recommends that you schedule a follow-up appointment in [place] with [facility] at [facility] to review cholesterol ( lipid)

## 2021-03-20 NOTE — Progress Notes (Signed)
Virtual Visit via Telephone Note   This visit type was conducted due to national recommendations for restrictions regarding the COVID-19 Pandemic (e.g. social distancing) in an effort to limit this patient's exposure and mitigate transmission in our community.  Due to his co-morbid illnesses, this patient is at least at moderate risk for complications without adequate follow up.  This format is felt to be most appropriate for this patient at this time.  The patient did not have access to video technology/had technical difficulties with video requiring transitioning to audio format only (telephone).  All issues noted in this document were discussed and addressed.  No physical exam could be performed with this format.  Please refer to the patient's chart for his  consent to telehealth for [facility].   Patient has given verbal permission to conduct this visit via virtual appointment and to bill insurance 03/20/2021 6:36 PM     Evaluation Performed:  Follow-up visit  Date:  03/20/2021   ID:  Randall Best, DOB Nov 09, 1951, MRN [id number]  Patient Location: Home Provider Location: Home Office  PCP:  Ramiro Harvest, PA-C  Cardiologist:  [name], MD Electrophysiologist:  Will Meredith Leeds, MD   Chief Complaint:   Chief Complaint  Patient presents with  . Follow-up    Feeling much better.  He has lost 11 pounds.  Much less dyspneic.  Less smothering sensation, less orthopnea.    . Congestive Heart Failure    Minimal edema, less orthopnea and less exertional dyspnea with weight loss.  . Atrial Fibrillation    Remains on amiodarone.  Has not had any breakthrough spells    ====================================  ASSESSMENT & PLAN:    Problem List Items Addressed This Visit    Persistent atrial fibrillation [facility]): CHA2DS2-VASc Score 4 (CHF, HTN, Age 51, Aortic Plaque) (Chronic)    Plan is to try to maintain sinus rhythm on amiodarone.  Continue to monitor his device to  ensure that he is maintaining sinus rhythm.  Plan:  Continue maintenance therapy with amiodarone for rhythm control along with carvedilol and digoxin for rate control.  Remains on Xarelto for DOAC      Relevant Orders   ECHOCARDIOGRAM COMPLETE   Comprehensive metabolic panel   Nonischemic dilated cardiomyopathy (HCC) (Chronic)    Probably related to combination of atrial fibrillation and left bundle branch block.  He has a CRT-D placed, based upon the procedure note, but apparently only in the pacemaker function is activated with no defibrillator therapies programmed.  Will defer to EP      Relevant Orders   ECHOCARDIOGRAM COMPLETE   Lipid panel   Comprehensive metabolic panel   AMB Referral to [facility] Pharm-D   Chronic combined systolic and diastolic heart failure (HCC) - Primary (Chronic)    Definitely improving.  He is not down to his target weight but is definitely improved from his most recent visit with me and Dr. Lovena Le.  He indicates that he is feeling much better with the weight loss and is planning to try to continue with the weight loss having adjusted his diet and exercising more.  Probably more to NYHA class II and potentially even down to class I.  His immediate goal is to get down into the low 220s, but ultimately would like to get back down closer to 200 pounds.  When he was the least symptomatic she was weighing somewhere between 200 to 205 pounds.  This is his ultimate goal.  With weight loss, his orthopnea,,  edema and exertional dyspnea have all notably improved.  He is on relatively optimal medications-his best tolerated from symptom standpoint. He is not on spironolactone because of elevated potassium levels in the past.  However depending on how his next chemistry panel turns out, we may be able to replace the potassium supplementation medication for spironolactone.  Plan:   Continue Entresto with split dosing of 24/26 mg 1 tab in the morning and 49/51 mg  tablet in the evening.  This will require additional preauthorization to get both medications otherwise he is going to take half a tablet of the 49/51 mg in the morning and full tablet in the evening.  Continue low-dose carvedilol, have chosen to avoid titrating further for the same reason that I am not titrating Entresto any further which is intermittent hypotension.  Consider adding spironolactone as noted (this would allow [place] to discontinue potassium supplement)  He is on relatively high-dose Lasix: 80 mg in the morning and 40 mg in the evening.  I recommended he be evening dose closer to 3-4 PM as opposed to 6 PM at dinnertime.  He is also taking metolazone 3 days a week and indicated he can take an additional dose as needed PRN.   Again, and we discussed sliding scale diuretic.  We will follow-up chemistry panel with lipid panel in June.       Relevant Orders   ECHOCARDIOGRAM COMPLETE   Lipid panel   Comprehensive metabolic panel   Hyperlipidemia LDL goal <70 (Chronic)    Is feeling better from a CHF standpoint, I think we need to get a better handle on his LDL level.  Last reading indicated LDL was 121 with a target of at least less than 100.  However, with aortic disease, I would try to target LDL less than 70 as the goal.  He is currently taking rosuvastatin 10 mg 3 days a week and tolerating it relatively well.  He is also now making significant changes to his diet and exercising more.  He has lost weight.  I am hoping that with a combination of higher dose statin and weight loss that he will not require additional therapy, however I suspect that he will likely require additional therapy.  He has had issues with different statins and Zetia in the past which leaves [place] Nexletol and then PCSK9 inhibitors versus inclisiran concerns options.  Plan: Recheck lipids and chemistry panel in June, and referred to [facility] run by our clinical pharmacist to discuss options of  therapy.  I discussed with him the potential of PCSK9 inhibitors as an injection medication which is designed to prevent further dilation of the aorta.      Relevant Orders   Lipid panel   Comprehensive metabolic panel   AMB Referral to [facility] Pharm-D   Thoracic aortic aneurysm ([place]) (Chronic)    We will plan to check a CT Angiogram Chest-Aorta to assess the thoracic aortic dilation, however with the contrast shortage, plan to hold off CT scan and we can reassess the ascending aorta with echocardiogram at the end of the year which should be the 2-year follow-up.  This will also allow [place] to keep an eye on his EF and filling parameters.  Continue to treat blood pressure.  However the other risk factor that needs to be better addressed this is li his lipid management.  Plan to reassess lipids and refer to [facility]      Tachycardia-bradycardia syndrome ([place]) (Chronic)  Status post PPM with new LV/His bundle lead for CRT-D.  Plan is to continue with amiodarone to try to maintain rhythm control along with carvedilol and digoxin for rate control.  Having backup CRT-P will allow [place] to avoid left bundle branch/A. fib combination.      On continuous oral anticoagulation (Chronic)    CHA2DS2-VASc score is 4.- No bleeding issues on Xarelto.  Okay to hold Xarelto 24 to 48 hours prior to minor procedures, and 72 hours for major procedures including spinal or neurologic procedures.  No need to bridge.      Long term current use of amiodarone (Chronic)    He had recent evaluation with PFTs, ESR and CRP  to evaluate for pulmonary toxicity--all normal.  Follow-up LFTs and TFTs as well as annual eye exam.  Most recent levels are within normal limits.         ====================================  History of Present Illness:    GAR GLANCE is a 70 y.o. male with PMH notable for Nonischemic Cardiomyopathy (presumably related to A. fib with Left Bundle Branch Block and who  presents via audio/video conferencing for a telehealth visit today as a 2-3 month f/u.[name]  Complex Cardiac History reviewed in December 2021 note -  January 2019-A. fib RVR diagnosis; failed TEE DCCV (complicated by TIA)  NONISCHEMIC CARDIOMYOPATHY: April February 28, 2019-noted swelling sensation with DOE and dry hacking cough; main symptom is a sensation of "smothering "when returning to sleep/orthopnea ? June 2020-worse symptoms Poor control A. fib RVR with LBBB. =>   Echo (delayed due to COVID-19 shutdown) ; EF <20% with global HK.  LBBB with A. fib RVR. => Seen in urgent follow-up, diltiazem discontinued, converted to carvedilol and Lasix increase.  07/14/2019:R&LHC: Normal coronaries.  PCWP and LVEDP = 17 mmHg.  CO-CI 4.92, 2.34.  Lasix 40 twice daily, digoxin and spironolactone started.  08/02/2019: EP referral to Dr. Curt Bears (seen briefly by Dr. Aundra Dubin In the [facility] => started on amiodarone for rhythm control; consider CRT-D   Facilitated DCCV attempt using amiodarone-> continued carvedilol, digoxin, spironolactone, Entresto.    With past symptoms, home weight was 204 to 205 pounds.   11/02/2019: Follow-up Echo-EF improved up to 35-40%.  Therefore no ICD in place.  However remained in A. Fib.  January 2021-again worsening CHF symptoms.  Weight up to 214 pounds.  PRN Zaroxolyn ordered.  Zio patch ordered to evaluate A. fib burden.   Weight 214 pounds. => Zio patch for A. fib burden; as needed Zaroxolyn. ? No A. fib noted on monitor.  Weaned down off of carvedilol.  Still having swelling and chest discomfort/smothering.  June 2021: EP follow-up with Dr. Curt Bears.  Discussed CRT-P =>  ? 08/01/2020: Attempted BiV PPM (CRT-P) insertion, unable to place LV lead. ->  Referred to Dr. Lovena Le, seen on 09/04/2020: Schedule for reattempted CRT-P, if unable to place LV lead, would place His bundle pacing lead. ? 09/11/2020: LV lead insertion  SHADEED COLBERG was last seen on  January 31, 2021 -> weight was still 238 pounds.  Still feeling the off-and-on "smothering" sensation as well as a dry hacking cough.  Was taking Zaroxolyn 3 times a week but not extra Lasix.  Swelling is down, wearing compression socks.  Still has orthopnea.  Sleeps upright.  Increased abdomen girth.  Does feel better since having the LVAD placed but still has swelling and dyspnea. Cardiovascular ROS: positive for - dyspnea on exertion, edema, orthopnea, paroxysmal nocturnal dyspnea, shortness  of breath and Occasional lightheadedness and dizziness; fatigue, weight gain negative for - chest pain, irregular heartbeat, palpitations, rapid heart rate or Syncope or near syncope, TIA/amaurosis fugax or claudication  I recommended he use PRN albuterol and referred him back to pulmonary medicine.  Had him take double up Lasix for 1 week in addition to his 3 times a week metolazone.  On days without metolazone take additional 20 mg furosemide.  Hospitalizations:  . n/a  He was seen by Dr. Lovena Le on April 22 for follow-up of his BiV ICD upgrade.  Stated he was doing relatively well.  Working out daily but was noting some dietary discretion with gradual increase in diuretic dosing.  No significant peripheral edema.  No syncope.  Despite this, his weight was 241 pounds. => Was continued on amiodarone 200 mg daily. --> At this point, he made a conscious decision to work on weight loss.  He was also seen in consultation by Dr. Vaughan Browner from Pulmonary Medicine -- did not feel like there was any lung component.  PFTs showed mild diffusion impairment.  Normal lung volumes but no evidence of ILD on chest imaging.  Recent - Interim CV studies:   The following studies were reviewed today: . None:  Inerval History   MARVELLE CAUDILL indicates that he is actually doing much better now.  He has started eating better with less snacking and junk food.  He has cut down sodas and other sweets.  He is also exercising more  and pushing himself to do more exercise.  He is doing lots of yard work gardening/landscaping.  He actually says he feels better now that he has in about a year.  He has lost 11pounds, indicates that the edema is significantly improved and as such he is also now not noting as much orthopnea with sleeping only on a couple pillows and no longer sleeping upright.  He says this "swelling sensation has notably improved.  He still has some spells, but is very happy about his progression.  He has no sensation of being in any rate irregular heartbeats.  He says that his blood pressures have been stable.   No chest pain, but does still have a little exertional dyspnea but much improved with weight loss.  Cardiovascular ROS: positive for - Improved exertional dyspnea, edema and orthopnea with no PND. negative for - chest pain, irregular heartbeat, palpitations, paroxysmal nocturnal dyspnea, rapid heart rate or Lightheadedness or dizziness or wooziness, syncope/near syncope or TIA/amaurosis fugax.  Claudication.  Has upcoming procedure for skin cancer - in ear.  Also has been using skin cream for pre-cancerous lesion on face.  Has pending pre-cancerous mole removal on back.   ROS:  Please see the history of present illness.    The patient does not have symptoms concerning for COVID-19 infection (fever, chills, cough, or new shortness of breath).  Review of Systems  Constitutional: Positive for malaise/fatigue (Energy level notably improved.) and weight loss (11 pounds since EP visit).  HENT: Negative for congestion and nosebleeds.   Respiratory: Positive for shortness of breath (Still some smothering shortness of breath, but much less exertional dyspnea.). Negative for cough (No longer having a dry hacking cough) and sputum production.   Cardiovascular: Positive for orthopnea (No longer sleeping upright, sleeping on a couple pillows) and leg swelling (Improved).  Gastrointestinal: Negative for abdominal pain,  blood in stool and melena.  Genitourinary: Negative for hematuria.  Musculoskeletal: Positive for back pain.  Neurological: Positive for dizziness (Off-and-on,  but not significant). Negative for weakness.  Endo/Heme/Allergies: Positive for environmental allergies.  Psychiatric/Behavioral: Negative for depression and memory loss. The patient is not nervous/anxious and does not have insomnia.     Past Medical History:  Diagnosis Date  . Asthma    as child  . Atrial fibrillation, rapid ([place]) 12/08/2017   Diagnosed during colonoscopy -presumably new onset  . BBB (bundle branch block)   . GERD (gastroesophageal reflux disease)    use meds prn  . Glaucoma   . Headache    remote h/o migraines, none in years  . Hyperlipidemia   . NICM (nonischemic cardiomyopathy) ([place]) 04/2019   Echo 05/2019 - EF reduced to <20%.  Has permanent Afib & LBBB (normal Coronaries on Cath). -->  After cardioversion and titration medications, EF up to 35-40% with  global hypokinesis..  . Thoracic aortic aneurysm ([place]) 10/20/2019   Ascending thoracic aortic estimated 44 mm on echo  . TIA (transient ischemic attack) 01/13/2018  . Ventral hernia    Past Surgical History:  Procedure Laterality Date  . BIV PACEMAKER INSERTION CRT-P N/A 08/01/2020   Procedure: BIV PACEMAKER INSERTION CRT-P;  Surgeon: Constance Haw, MD;  Location: [place] [facility];  Service: Cardiovascular;  Laterality: N/A;  . CARDIAC EVENT MONITOR  12/2016   showed persistent Afib -monitor return in early because of persistent A. fib.  Several episodes of severe RVR with rates in the 170s were noted.   [name] CARDIOVERSION N/A 01/13/2018   Procedure: CARDIOVERSION;  Surgeon: Pixie Casino, MD;  Location: [facility] ENDOSCOPY;  Service: Cardiovascular;  Laterality: N/A; unsuccessful.-->  Post-cardioversion, the patient had left-sided facial numbness and weakness.  Ruled out for stroke.  [name] CARDIOVERSION N/A 08/19/2019   Procedure: CARDIOVERSION;  Surgeon:  Larey Dresser, MD;  Location: [facility] ENDOSCOPY;  Service: Cardiovascular;  Laterality: N/A;  . INSERTION OF MESH N/A 07/22/2017   Procedure: INSERTION OF MESH;  Surgeon: [name], MD;  Location: [place];  Service: General;  Laterality: N/A;  BILATERAL TAP BLOCK  . LEAD INSERTION N/A 09/11/2020   Procedure: LEAD INSERTION - LV LEAD;  Surgeon: Evans Lance, MD;  Location: [place] [facility];  Service: Cardiovascular;  Laterality: N/A;  . NM MYOVIEW LTD  12/2017   Shows reduced EF, but no evidence of ischemia or infarction.  EF 40 and 45%.  INTERMEDIATE RISK due to reduced function.  EF notably better on echo (50 and 55%)  . RIGHT/LEFT HEART CATH AND CORONARY ANGIOGRAPHY N/A 07/14/2019   Procedure: RIGHT/LEFT HEART CATH AND CORONARY ANGIOGRAPHY;  Surgeon: [name], MD;  Location: [place] [facility];; Angiographically normal coronary arteries. RHC  - PCWP & LVEDP 17 mmHg.  CO-CI 4.92-2.34  . TEE WITHOUT CARDIOVERSION N/A 01/13/2018   Procedure: TRANSESOPHAGEAL ECHOCARDIOGRAM (TEE) with cardioversion - ;  Surgeon: Pixie Casino, MD;  Location: [facility] ENDOSCOPY;  Service: Cardiovascular;  Laterality: N/A;  Mild concentric LVH.  EF 55 to 60%.  No R WMA.  Mild ascending aortic dilation of 4.2 cm.  Dilated left atrium.  No thrombus.  . TONSILLECTOMY    . TRANSTHORACIC ECHOCARDIOGRAM  10/11/2019   EF up to 35-40%.-Moderate-severely decreased function.  "GR 1 DD ".  Normal atrial sizes.  Moderate ascending aorta-44 mL.  Relatively normal valves.  . TRANSTHORACIC ECHOCARDIOGRAM  06/2019    Severely reduced function with diffuse hypokinesis and septal dyskinesis.  EF <20%.  Cannot exclude LV thrombus.  Reduced RV function with mildly elevated RV P--37 mmHg.  Moderate RA and mild LA dilation.  A centimeter dilation 42 mm  . VENTRAL HERNIA REPAIR N/A 07/22/2017   Procedure: OPEN VENTRAL HERNIA REPAIR WITH MESH ERAS PATHWAY;  Surgeon: [name], MD;  Location: [place];  Service: General;   Laterality: N/A;  BILATERAL TAP BLOCK    Option Current Meds  Medication Sig  . acetaminophen (TYLENOL) 500 MG tablet Take 500 mg by mouth every 6 (six) hours as needed for moderate pain.   [name] albuterol (VENTOLIN HFA) 108 (90 Base) MCG/ACT inhaler Inhale 1 puff into the lungs every 4 (four) hours as needed for wheezing or shortness of breath.   [name] amiodarone (PACERONE) 200 MG tablet Take 1 tablet (200 mg total) by mouth daily.  . benzonatate (TESSALON) 100 MG capsule Take 100 mg by mouth 3 (three) times daily as needed for cough.  . budesonide-formoterol (SYMBICORT) 160-4.5 MCG/ACT inhaler Inhale 2 puffs into the lungs in the morning and at bedtime.  . carvedilol (COREG) 3.125 MG tablet Take 3.125 mg by mouth 2 (two) times daily with a meal.   . Cholecalciferol (VITAMIN D) 50 MCG (2000 UT) tablet Take 2,000 Units by mouth daily.  . digoxin (LANOXIN) 0.125 MG tablet TAKE 1 TABLET BY MOUTH DAILY  . ENTRESTO 24-26 MG Take 1 tablet by mouth every morning.  . furosemide (LASIX) 40 MG tablet TAKE 1 TABLET BY MOUTH 2 TIMES DAILY. MAY TAKE AN ADDITIONAL DOSE IF WT IS GREATER THAN 3 LBS DAILY.  . hydrocortisone (ANUSOL-HC) 25 MG suppository Place 25 mg rectally 2 (two) times daily as needed for hemorrhoids or anal itching.  . metolazone (ZAROXOLYN) 2.5 MG tablet TAKE 1 TABLET BY MOUTH EVERY MONDAY, WEDNESDAY, AND FRIDAY. [place] FUROSEMIDE DOSE  . Multiple Vitamins-Minerals (ULTRA MENS PACK PO) Take 1 tablet by mouth daily.  . nitroGLYCERIN (NITROSTAT) 0.4 MG SL tablet Place 1 tablet (0.4 mg total) under the tongue every 5 (five) minutes as needed for chest pain.  . Omega-3 Fatty Acids (FISH OIL) 1200 MG CAPS Take 1,200 mg by mouth every other day.  [name] omeprazole (PRILOSEC) 20 MG capsule TAKE 1 CAPSULE BY MOUTH EVERY DAY  . potassium chloride SA (KLOR-CON) 20 MEQ tablet Take 20 mEq by mouth 2 (two) times daily.  . rosuvastatin (CRESTOR) 5 MG tablet Take 1 tablet (5 mg total) by mouth at  bedtime.  . sacubitril-valsartan (ENTRESTO) 49-51 MG Take 1 tablet by mouth at bedtime. ,per Dr Ellyn Hack  . XARELTO 20 MG TABS tablet TAKE 1 TABLET (20 MG TOTAL) BY MOUTH DAILY WITH SUPPER.     Allergies:   Prilosec [omeprazole], Statins, Zetia [ezetimibe], Iodine, and Latex   Social History   Tobacco Use  . Smoking status: Never Smoker  . Smokeless tobacco: Never Used  Vaping Use  . Vaping Use: Never used  Substance Use Topics  . Alcohol use: Yes    Comment: Rarely  . Drug use: No    Social History   Social History Narrative   He is a married father of 56, grandfather of 1.     Lives with his wife.      Now started back in an exercise routine trying to exercise least 30 minutes a day, and doing outdoor activities including yard work/landscaping and planting.     Family Hx: The patient's family history includes CVA in his maternal grandmother; Diabetes in his mother; Heart failure in his father; Hypertension in his brother. There is no history of Colon cancer,  Esophageal cancer, Rectal cancer, or Stomach cancer.   Labs/Other Tests and Data Reviewed:    EKG:  No ECG reviewed.  Recent Labs: 10/26/2020: BNP 17.4; Magnesium 2.1 01/31/2021: ALT 38; TSH 1.560 02/08/2021: BUN 20; Creatinine, Ser 1.19; Potassium 4.4; Sodium 139 03/05/2021: Hemoglobin 13.8; NT-Pro BNP 22; Platelets 194.0  Lab Results  Component Value Date   TSH 1.560 01/31/2021    Recent Lipid Panel Lab Results  Component Value Date/Time   CHOL 203 (H) 01/31/2021 11:07 AM   TRIG 185 (H) 01/31/2021 11:07 AM   HDL 49 01/31/2021 11:07 AM   CHOLHDL 4.1 01/31/2021 11:07 AM   CHOLHDL 4.8 01/14/2018 06:31 AM   LDLCALC 121 (H) 01/31/2021 11:07 AM    Wt Readings from Last 3 Encounters:  03/20/21 230 lb (104.3 kg)  03/05/21 233 lb (105.7 kg)  03/01/21 241 lb (109.3 kg)     Objective:    Vital Signs:  BP 128/83   Pulse 73   Ht _0  (1.753 m)   Wt 230 lb (104.3 kg)   BMI 33.97 kg/m   VITAL SIGNS:   reviewed Well nourished, well developed male in  acute distress. A&O x 3.  normal Mood & Affect Non-labored respirations  ==========================================  COVID-19 Education: The signs and symptoms of COVID-19 were discussed with the patient and how to seek care for testing (follow up with PCP or arrange E-visit).   The importance of social distancing was discussed today.  Time:   Today, I have spent 25 minutes with the patient with telehealth technology discussing the above problems.   An additional 8 minutes spent charting (reviewing prior notes, hospital records, studies, labs etc.) Total 19mnutes   Medication Adjustments/Labs and Tests Ordered: Current medicines are reviewed at length with the patient today.  Concerns regarding medicines are outlined above.   Patient Instructions  Medication Instructions:   NO changes  *If you need a refill on your cardiac medications before your next appointment, please call your pharmacy*   Lab Work:  [place] Late June - Early July (prior to appointment with [facility] Pharm D)  If you have labs (blood work) drawn today and your tests are completely normal, you will receive your results only by: .Marland KitchenMyChart Message (if you have MyChart) OR . A paper copy in the mail If you have any lab test that is abnormal or we need to change your treatment, we will call you to review the results.   Testing/Procedures: Echocardiogram (in December) -- to reassess Heart Function & Thoracic Aorta dilation.   Follow-Up: [facility].  As part of our continuing mission to provide you with exceptional heart care, we have created designated Provider Care Teams.  These Care Teams include your primary Cardiologist (physician) and Advanced Practice Providers (APPs -  Physician Assistants and Nurse Practitioners) who all work together to provide you with the care you need, when you  need it.    Your next appointment:   4-5 month(s)  The format for your next appointment:   Virtual Visit   Provider:   DGlenetta Hew MD   Other Instructions Keep working on weight loss.  We will work on the EPraxairprescription issue.  Your physician recommends that you schedule a follow-up appointment [facility] [place] [facility]      Signed, DGlenetta Hew MD  03/20/2021 6:36 PM    [facility]

## 2021-03-20 NOTE — Assessment & Plan Note (Signed)
Plan is to try to maintain sinus rhythm on amiodarone.  Continue to monitor his device to ensure that he is maintaining sinus rhythm.  Plan:  Continue maintenance therapy with amiodarone for rhythm control along with carvedilol and digoxin for rate control.  Remains on Xarelto for DOAC

## 2021-03-20 NOTE — Assessment & Plan Note (Signed)
Status post PPM with new LV/His bundle lead for CRT-D.  Plan is to continue with amiodarone to try to maintain rhythm control along with carvedilol and digoxin for rate control.  Having backup CRT-P will allow [place] to avoid left bundle branch/A. fib combination.

## 2021-03-20 NOTE — Assessment & Plan Note (Signed)
He had recent evaluation with PFTs, ESR and CRP  to evaluate for pulmonary toxicity--all normal.  Follow-up LFTs and TFTs as well as annual eye exam.  Most recent levels are within normal limits.

## 2021-03-20 NOTE — Assessment & Plan Note (Addendum)
CHA2DS2-VASc score is 4.- No bleeding issues on Xarelto.  Okay to hold Xarelto 24 to 48 hours prior to minor procedures, and 72 hours for major procedures including spinal or neurologic procedures.  No need to bridge.

## 2021-03-20 NOTE — Assessment & Plan Note (Signed)
Is feeling better from a CHF standpoint, I think we need to get a better handle on his LDL level.  Last reading indicated LDL was 121 with a target of at least less than 100.  However, with aortic disease, I would try to target LDL less than 70 as the goal.  He is currently taking rosuvastatin 10 mg 3 days a week and tolerating it relatively well.  He is also now making significant changes to his diet and exercising more.  He has lost weight.  I am hoping that with a combination of higher dose statin and weight loss that he will not require additional therapy, however I suspect that he will likely require additional therapy.  He has had issues with different statins and Zetia in the past which leaves [place] Nexletol and then PCSK9 inhibitors versus inclisiran concerns options.  Plan: Recheck lipids and chemistry panel in June, and referred to [facility] run by our clinical pharmacist to discuss options of therapy.  I discussed with him the potential of PCSK9 inhibitors as an injection medication which is designed to prevent further dilation of the aorta.

## 2021-03-20 NOTE — Assessment & Plan Note (Signed)
Probably related to combination of atrial fibrillation and left bundle branch block.  He has a CRT-D placed, based upon the procedure note, but apparently only in the pacemaker function is activated with no defibrillator therapies programmed.  Will defer to EP

## 2021-03-20 NOTE — Assessment & Plan Note (Addendum)
Definitely improving.  He is not down to his target weight but is definitely improved from his most recent visit with me and Dr. Lovena Le.  He indicates that he is feeling much better with the weight loss and is planning to try to continue with the weight loss having adjusted his diet and exercising more.  Probably more to NYHA class II and potentially even down to class I.  His immediate goal is to get down into the low 220s, but ultimately would like to get back down closer to 200 pounds.  When he was the least symptomatic she was weighing somewhere between 200 to 205 pounds.  This is his ultimate goal.  With weight loss, his orthopnea,, edema and exertional dyspnea have all notably improved.  He is on relatively optimal medications-his best tolerated from symptom standpoint. He is not on spironolactone because of elevated potassium levels in the past.  However depending on how his next chemistry panel turns out, we may be able to replace the potassium supplementation medication for spironolactone.  Plan:   Continue Entresto with split dosing of 24/26 mg 1 tab in the morning and 49/51 mg tablet in the evening.  This will require additional preauthorization to get both medications otherwise he is going to take half a tablet of the 49/51 mg in the morning and full tablet in the evening.  Continue low-dose carvedilol, have chosen to avoid titrating further for the same reason that I am not titrating Entresto any further which is intermittent hypotension.  Consider adding spironolactone as noted (this would allow [place] to discontinue potassium supplement)  He is on relatively high-dose Lasix: 80 mg in the morning and 40 mg in the evening.  I recommended he be evening dose closer to 3-4 PM as opposed to 6 PM at dinnertime.  He is also taking metolazone 3 days a week and indicated he can take an additional dose as needed PRN.   Again, and we discussed sliding scale diuretic.  We will follow-up chemistry  panel with lipid panel in June.

## 2021-03-20 NOTE — Telephone Encounter (Signed)
RN spoke to patient. Instruction were given  from today's virtual visit 03/20/21.  AVS SUMMARY has been sent by mychart  and mailed with labslip. Appointment scheduled.   Patient verbalized understanding

## 2021-03-20 NOTE — Assessment & Plan Note (Addendum)
We will plan to check a CT Angiogram Chest-Aorta to assess the thoracic aortic dilation, however with the contrast shortage, plan to hold off CT scan and we can reassess the ascending aorta with echocardiogram at the end of the year which should be the 2-year follow-up.  This will also allow [place] to keep an eye on his EF and filling parameters.  Continue to treat blood pressure.  However the other risk factor that needs to be better addressed this is li his lipid management.  Plan to reassess lipids and refer to [facility]

## 2021-03-28 ENCOUNTER — Telehealth: Payer: Self-pay | Admitting: *Deleted

## 2021-03-28 NOTE — Telephone Encounter (Signed)
Started prior authorization for entresto 24/26 mg to take one tablet in the morning. Patient will be taking 49/51 mg in the evening.  the prior authorization is only for 24/26 at present time   Key [id number] EF 35 TO 40 %  UNABLE TOLERATE FULL STRENGTH 49/51 MG TWICE A DAY  BUT IS MANAGING WELL 24/26 IN MORNING AND 49/51 MG IN THE EVENING  DX I50.42 CHRONIC COMBINED SYSTOLIC DIASTOLIC HEART FAILURE   AND  I42.0 NONISCHEMIC DILATED CARDIOMYOPATHY  Sent office note from 03/20/21 and 324/22 Awaiting response

## 2021-03-29 NOTE — Telephone Encounter (Signed)
Called and informed patient that the 24/26 mg  Entresto  (One tablet in the morning only)  was approved . He should be able to pick up from pharmacy. Patient verbalized understanding.

## 2021-04-12 ENCOUNTER — Other Ambulatory Visit: Payer: Self-pay | Admitting: Cardiology

## 2021-04-12 DIAGNOSIS — I5043 Acute on chronic combined systolic (congestive) and diastolic (congestive) heart failure: Secondary | ICD-10-CM

## 2021-04-12 DIAGNOSIS — I5042 Chronic combined systolic (congestive) and diastolic (congestive) heart failure: Secondary | ICD-10-CM

## 2021-05-02 ENCOUNTER — Ambulatory Visit (INDEPENDENT_AMBULATORY_CARE_PROVIDER_SITE_OTHER): Payer: 59

## 2021-05-02 DIAGNOSIS — I495 Sick sinus syndrome: Secondary | ICD-10-CM

## 2021-05-02 LAB — CUP PACEART REMOTE DEVICE CHECK
Battery Remaining Longevity: 121 mo
Battery Voltage: 3.04 V
Brady Statistic AP VP Percent: 78.8 %
Brady Statistic AP VS Percent: 0.08 %
Brady Statistic AS VP Percent: 21.06 %
Brady Statistic AS VS Percent: 0.06 %
Brady Statistic RA Percent Paced: 78.88 %
Brady Statistic RV Percent Paced: 99.86 %
Date Time Interrogation Session: 20220622211535
Implantable Lead Implant Date: 20210922
Implantable Lead Implant Date: 20211102
Implantable Lead Implant Date: 20211102
Implantable Lead Location: 753858
Implantable Lead Location: 753859
Implantable Lead Location: 753860
Implantable Lead Model: 3830
Implantable Lead Model: 4196
Implantable Lead Model: 5076
Implantable Pulse Generator Implant Date: 20210922
Lead Channel Impedance Value: 323 Ohm
Lead Channel Impedance Value: 380 Ohm
Lead Channel Impedance Value: 418 Ohm
Lead Channel Impedance Value: 513 Ohm
Lead Channel Impedance Value: 513 Ohm
Lead Channel Impedance Value: 551 Ohm
Lead Channel Impedance Value: 570 Ohm
Lead Channel Impedance Value: 722 Ohm
Lead Channel Impedance Value: 874 Ohm
Lead Channel Pacing Threshold Amplitude: 0.75 V
Lead Channel Pacing Threshold Amplitude: 0.75 V
Lead Channel Pacing Threshold Pulse Width: 0.4 ms
Lead Channel Pacing Threshold Pulse Width: 0.4 ms
Lead Channel Sensing Intrinsic Amplitude: 10.75 mV
Lead Channel Sensing Intrinsic Amplitude: 2.375 mV
Lead Channel Sensing Intrinsic Amplitude: 2.375 mV
Lead Channel Sensing Intrinsic Amplitude: 4 mV
Lead Channel Setting Pacing Amplitude: 1.5 V
Lead Channel Setting Pacing Amplitude: 2 V
Lead Channel Setting Pacing Amplitude: 2 V
Lead Channel Setting Pacing Pulse Width: 0.4 ms
Lead Channel Setting Pacing Pulse Width: 0.4 ms
Lead Channel Setting Sensing Sensitivity: 0.9 mV

## 2021-05-07 LAB — COMPREHENSIVE METABOLIC PANEL
ALT: 24 IU/L (ref 0–44)
AST: 21 IU/L (ref 0–40)
Albumin/Globulin Ratio: 2 (ref 1.2–2.2)
Albumin: 4.7 g/dL (ref 3.8–4.8)
Alkaline Phosphatase: 65 IU/L (ref 44–121)
BUN/Creatinine Ratio: 18 (ref 10–24)
BUN: 20 mg/dL (ref 8–27)
Bilirubin Total: 0.4 mg/dL (ref 0.0–1.2)
CO2: 23 mmol/L (ref 20–29)
Calcium: 9.2 mg/dL (ref 8.6–10.2)
Chloride: 104 mmol/L (ref 96–106)
Creatinine, Ser: 1.09 mg/dL (ref 0.76–1.27)
Globulin, Total: 2.4 g/dL (ref 1.5–4.5)
Glucose: 89 mg/dL (ref 65–99)
Potassium: 4.1 mmol/L (ref 3.5–5.2)
Sodium: 142 mmol/L (ref 134–144)
Total Protein: 7.1 g/dL (ref 6.0–8.5)
eGFR: 73 mL/min/{1.73_m2} (ref >59)

## 2021-05-07 LAB — LIPID PANEL
Chol/HDL Ratio: 4 ratio (ref 0.0–5.0)
Cholesterol, Total: 188 mg/dL (ref 100–199)
HDL: 47 mg/dL (ref >39)
LDL Chol Calc (NIH): 113 mg/dL — ABNORMAL HIGH (ref 0–99)
Triglycerides: 161 mg/dL — ABNORMAL HIGH (ref 0–149)
VLDL Cholesterol Cal: 28 mg/dL (ref 5–40)

## 2021-05-21 ENCOUNTER — Other Ambulatory Visit: Payer: Self-pay

## 2021-05-21 ENCOUNTER — Ambulatory Visit (INDEPENDENT_AMBULATORY_CARE_PROVIDER_SITE_OTHER): Payer: Self-pay | Admitting: Pharmacist Clinician (PhC)/ Clinical Pharmacy Specialist

## 2021-05-21 VITALS — BP 156/98 | HR 67 | Resp 16 | Ht 69.0 in | Wt 240.6 lb

## 2021-05-21 DIAGNOSIS — E785 Hyperlipidemia, unspecified: Secondary | ICD-10-CM

## 2021-05-21 DIAGNOSIS — T466X5A Adverse effect of antihyperlipidemic and antiarteriosclerotic drugs, initial encounter: Secondary | ICD-10-CM

## 2021-05-21 DIAGNOSIS — G720 Drug-induced myopathy: Secondary | ICD-10-CM

## 2021-05-21 DIAGNOSIS — E669 Obesity, unspecified: Secondary | ICD-10-CM

## 2021-05-21 NOTE — Progress Notes (Signed)
Remote pacemaker transmission.   

## 2021-05-21 NOTE — Progress Notes (Signed)
05/24/2021 HAYWOOD MEINDERS [birth year] [id number]   HPI:  LYSANDER CALIXTE is a 70 y.o. male patient of Dr Ellyn Hack, who presents today for a lipid clinic evaluation.  See pertinent past medical history below.  He recently had a telehealth visit with Dr. Ellyn Hack and it was noted that because of his aortic aneurysm as well as prior TIA, his goal LDL is < 70.  He has previously had problems with other statin drugs, as well as ezetimibe, but is currently taking rosuvastatin 10 mg daily.  It has started to cause pains in his knees and is limiting activity.    Today he is in the office to discuss options to get cholesterol levels to goal.  States he is feeling mostly well, although still sleeps sitting up, due to a smothering feeling if he lays down.  Currently he is rather frustrated with his inability to stop eating.  He feels as though he could eat 8 meals per day and his weight is fluctuating between 230 and 240 lbs.  BMI today is 35.5.      Past Medical History: CHF Combined systolic/diastolic; EF at < 61% (04/736), improved to 35-40% (10/2019)  AF Persistent, CHADS2-VASc score 4, on Xarelto  Thoracic aortic aneurysm   TIA 3/19 - see hospital note    Current Medications: rosuvastatin 10 mg  Cholesterol Goals: LDL < 70   Intolerant/previously tried: atorvastatin, pitavastatin, simvastatin - myalgias, ezetimibe - broke out in sores  Family history: father with CHF, died at 53; mother with DM, tobacco abuse; sister with CAD - CEA; brother with good health, 1 daughter with migraines, anxiety  Diet: always hungry (worse in past 6-12 months), nibbles throughout the day; not much for fruit, likes vegetables,   Exercise:  Monday exercise routine (squats, resistance exercises), otherwise 6+ hours per day active in yard  Labs: 6/22:  TC 188, TG 161, HDL 47, LDL 113   Current Outpatient Medications  Medication Sig Dispense Refill   acetaminophen (TYLENOL) 500 MG tablet Take 500 mg by mouth  every 6 (six) hours as needed for moderate pain.      albuterol (VENTOLIN HFA) 108 (90 Base) MCG/ACT inhaler Inhale 1 puff into the lungs every 4 (four) hours as needed for wheezing or shortness of breath.      amiodarone (PACERONE) 200 MG tablet Take 1 tablet (200 mg total) by mouth daily. 90 tablet 3   benzonatate (TESSALON) 100 MG capsule Take 100 mg by mouth 3 (three) times daily as needed for cough.     budesonide-formoterol (SYMBICORT) 160-4.5 MCG/ACT inhaler Inhale 2 puffs into the lungs in the morning and at bedtime. 10.2 g 5   carvedilol (COREG) 3.125 MG tablet Take 3.125 mg by mouth 2 (two) times daily with a meal.      Cholecalciferol (VITAMIN D) 50 MCG (2000 UT) tablet Take 2,000 Units by mouth daily.     digoxin (LANOXIN) 0.125 MG tablet TAKE 1 TABLET BY MOUTH DAILY 90 tablet 3   ENTRESTO 24-26 MG Take 1 tablet by mouth every morning. 60 tablet 5   furosemide (LASIX) 40 MG tablet TAKE 1 TABLET BY MOUTH 2 TIMES DAILY. MAY TAKE AN ADDITIONAL DOSE IF WT IS GREATER THAN 3 LBS DAILY. 270 tablet 3   hydrocortisone (ANUSOL-HC) 25 MG suppository Place 25 mg rectally 2 (two) times daily as needed for hemorrhoids or anal itching.     metolazone (ZAROXOLYN) 2.5 MG tablet TAKE 1 TABLET BY MOUTH EVERY MONDAY, WEDNESDAY, AND FRIDAY.  30 MINUTES PRIOR TO YOUR FUROSEMIDE DOSE 45 tablet 3   Multiple Vitamins-Minerals (ULTRA MENS PACK PO) Take 1 tablet by mouth daily.     mupirocin ointment (BACTROBAN) 2 % Apply 1 application topically daily.     nitroGLYCERIN (NITROSTAT) 0.4 MG SL tablet Place 1 tablet (0.4 mg total) under the tongue every 5 (five) minutes as needed for chest pain. 25 tablet 3   Omega-3 Fatty Acids (FISH OIL) 1200 MG CAPS Take 1,200 mg by mouth every other day.     omeprazole (PRILOSEC) 20 MG capsule TAKE 1 CAPSULE BY MOUTH EVERY DAY 90 capsule 3   potassium chloride SA (KLOR-CON) 20 MEQ tablet Take 20 mEq by mouth 2 (two) times daily.     rosuvastatin (CRESTOR) 5 MG tablet Take 1  tablet (5 mg total) by mouth at bedtime. 90 tablet 3   sacubitril-valsartan (ENTRESTO) 49-51 MG Take 1 tablet by mouth at bedtime. ,per Dr Ellyn Hack     triamcinolone cream (KENALOG) 0.1 % Apply 1 application topically as needed.     XARELTO 20 MG TABS tablet TAKE 1 TABLET (20 MG TOTAL) BY MOUTH DAILY WITH SUPPER. 90 tablet 1   No current facility-administered medications for this visit.    Allergies  Allergen Reactions   Statins Other (See Comments)    Muscle pains, mouth sores   Zetia [Ezetimibe] Other (See Comments)    Broke out in sores/ muscle pain   Crestor [Rosuvastatin]     myalgias   Lipitor [Atorvastatin] Other (See Comments)    Knee pain   Livalo [Pitavastatin] Other (See Comments)    Muscle pain   Spironolactone     Soreness   Vaseline [Petrolatum]     Break out   Zocor [Simvastatin]     myalgias   Iodine Swelling and Rash   Latex Rash    Bandages not gloves    Past Medical History:  Diagnosis Date   Asthma    as child   Atrial fibrillation, rapid ([place]) 12/08/2017   Diagnosed during colonoscopy -presumably new onset   BBB (bundle branch block)    GERD (gastroesophageal reflux disease)    use meds prn   Glaucoma    Headache    remote h/o migraines, none in years   Hyperlipidemia    NICM (nonischemic cardiomyopathy) ([place]) 04/2019   Echo 05/2019 - EF reduced to <20%.  Has permanent Afib & LBBB (normal Coronaries on Cath). -->  After cardioversion and titration medications, EF up to 35-40% with  global hypokinesis..   Thoracic aortic aneurysm ([place]) 10/20/2019   Ascending thoracic aortic estimated 44 mm on echo   TIA (transient ischemic attack) 01/13/2018   Ventral hernia     Blood pressure (!) 156/98, pulse 67, resp. rate 16, height 5\' 9"  (1.753 m), weight 240 lb 9.6 oz (109.1 kg), SpO2 95 %.   Hyperlipidemia LDL goal <70 Patient with history of TIA with LDL elevated at 113 and unable to tolerate multiple statin drugs.  Reviewed options for lowering LDL  cholesterol, including ezetimibe, PCSK-9 inhibitors, bempedoic acid and inclisiran.  Discussed mechanisms of action, dosing, side effects and potential decreases in LDL cholesterol.  Answered all patient questions.  Based on this information, patient would prefer to start PCSK-9 inhibitor.  Will also make one last attempt at taking statin, he will stop the rosuvastatin for 2 weeks, then restart with 5 mg just two days per week.     Obesity, Class II, BMI 35-39.9 Patient with ongoing  weight problems, has been unsuccessful with weight loss.  Had discussion with patient on monitoring carbohydrates, meal portions and snacking habits.  He will continue to work on these, however we will start Wegovy.  Patient aware of dosing and side effects.     Tommy Medal PharmD CPP [facility] [address] [place], [place] [address] [phone]

## 2021-05-21 NOTE — Patient Instructions (Addendum)
Your Results:             Your most recent labs Goal  Total Cholesterol 188 < 200  Triglycerides 161 < 150  HDL (happy/good cholesterol) 47 > 40  LDL (lousy/bad cholesterol 113 < 70      Medication changes:  We will start the process to get Repatha (or Praluent) covered by your insurance.  Once approved, [facility] will call you to let you know.    Stop rosuvastatin completely for July.  On August 1 restart at 1 tablet each Monday and Friday only.     I will talk to Dr. Ellyn Hack about the possibility of getting [facility] for you to use for weight loss  Lab orders:  We will have you repeat cholesterol labs after 2-3 months of using Repatha (Praluent).    Thank you for choosing [facility]

## 2021-05-24 DIAGNOSIS — E669 Obesity, unspecified: Secondary | ICD-10-CM | POA: Insufficient documentation

## 2021-05-24 NOTE — Assessment & Plan Note (Signed)
Patient with history of TIA with LDL elevated at 113 and unable to tolerate multiple statin drugs.  Reviewed options for lowering LDL cholesterol, including ezetimibe, PCSK-9 inhibitors, bempedoic acid and inclisiran.  Discussed mechanisms of action, dosing, side effects and potential decreases in LDL cholesterol.  Answered all patient questions.  Based on this information, patient would prefer to start PCSK-9 inhibitor.  Will also make one last attempt at taking statin, he will stop the rosuvastatin for 2 weeks, then restart with 5 mg just two days per week.

## 2021-05-24 NOTE — Assessment & Plan Note (Signed)
Patient with ongoing weight problems, has been unsuccessful with weight loss.  Had discussion with patient on monitoring carbohydrates, meal portions and snacking habits.  He will continue to work on these, however we will start Wegovy.  Patient aware of dosing and side effects.

## 2021-05-28 ENCOUNTER — Telehealth: Payer: Self-pay

## 2021-05-28 NOTE — Telephone Encounter (Signed)
Called and lmomed the pt that they need to try a wt loss program for at least 39months. I offered sending a referral to healthy wt [facility] wellness and will await a phone call back from the pt

## 2021-07-08 ENCOUNTER — Other Ambulatory Visit: Payer: Self-pay | Admitting: Cardiology

## 2021-07-08 DIAGNOSIS — I5042 Chronic combined systolic (congestive) and diastolic (congestive) heart failure: Secondary | ICD-10-CM

## 2021-07-08 DIAGNOSIS — I420 Dilated cardiomyopathy: Secondary | ICD-10-CM

## 2021-07-09 ENCOUNTER — Telehealth: Payer: Self-pay | Admitting: *Deleted

## 2021-07-09 NOTE — Telephone Encounter (Signed)
Left message to call back would like to switch to an in person appt for 07/18/21 with Dr Ellyn Hack .

## 2021-07-17 NOTE — Telephone Encounter (Signed)
LEFT MESSAGE  TO CALL WOULD LIKE TO SCHEDULE AN  IN PERSON VISIT

## 2021-07-18 ENCOUNTER — Ambulatory Visit (INDEPENDENT_AMBULATORY_CARE_PROVIDER_SITE_OTHER): Payer: 59 | Admitting: Cardiology

## 2021-07-18 ENCOUNTER — Other Ambulatory Visit: Payer: Self-pay

## 2021-07-18 VITALS — BP 134/80 | HR 80 | Ht 69.0 in | Wt 240.0 lb

## 2021-07-18 DIAGNOSIS — I495 Sick sinus syndrome: Secondary | ICD-10-CM | POA: Diagnosis not present

## 2021-07-18 DIAGNOSIS — I712 Thoracic aortic aneurysm, without rupture, unspecified: Secondary | ICD-10-CM

## 2021-07-18 DIAGNOSIS — T50905A Adverse effect of unspecified drugs, medicaments and biological substances, initial encounter: Secondary | ICD-10-CM

## 2021-07-18 DIAGNOSIS — Z79899 Other long term (current) drug therapy: Secondary | ICD-10-CM

## 2021-07-18 DIAGNOSIS — I4819 Other persistent atrial fibrillation: Secondary | ICD-10-CM

## 2021-07-18 DIAGNOSIS — Z7901 Long term (current) use of anticoagulants: Secondary | ICD-10-CM

## 2021-07-18 DIAGNOSIS — I5042 Chronic combined systolic (congestive) and diastolic (congestive) heart failure: Secondary | ICD-10-CM

## 2021-07-18 DIAGNOSIS — N62 Hypertrophy of breast: Secondary | ICD-10-CM

## 2021-07-18 DIAGNOSIS — I420 Dilated cardiomyopathy: Secondary | ICD-10-CM

## 2021-07-18 DIAGNOSIS — E785 Hyperlipidemia, unspecified: Secondary | ICD-10-CM

## 2021-07-18 DIAGNOSIS — I872 Venous insufficiency (chronic) (peripheral): Secondary | ICD-10-CM

## 2021-07-18 NOTE — Progress Notes (Signed)
Primary Care Provider: Ramiro Harvest, PA-C Cardiologist: [name], MD Electrophysiologist: Will Meredith Leeds, MD  Clinic Note: Chief Complaint  Patient presents with   Follow-up    4-5 months.   Shortness of Breath    Smothering sensation has definitely gotten better.   Edema    Feet.   Congestive Heart Failure    Weight at home this morning was 235 pounds. Felt a whole lot better from last visit after increasing diuretic.    ===================================  ASSESSMENT/PLAN   Problem List Items Addressed This Visit       Cardiology Problems   Persistent atrial fibrillation [facility]): CHA2DS2-VASc Score 4 (CHF, HTN, Age 45, Aortic Plaque) (Chronic)    Now status post BiV ICD with standing amiodarone to maintain sinus rhythm.  Seems to be doing relatively well along with a combination of digoxin and carvedilol.  On Xarelto with minimal bleeding.  He knows to hold briefly if there are bleeds.      Relevant Medications   EPLERENONE PO   Nonischemic dilated cardiomyopathy (HCC) (Chronic)   Relevant Medications   EPLERENONE PO   Chronic combined systolic and diastolic heart failure ([place]) - Primary (Chronic)    He definitely feels better since he made adjustments, but his weights are still up.  I am still concerned and I do agree that GLP-1 agonist would be great for the cardiovascular benefit.  Perhaps Rybelsus or Ozempic as opposed to [employer]. He is on a pretty stable regimen, but his weight is up today.  I have asked that he double up his furosemide along with metolazone until back down to dry weight these 2 and 30 pounds.  Would like to try to get him down to 220. Symptoms are probably NYHA class II.  Plan: He initially had some issues with split dosing of Entresto at 4950 1 in the evening and 24-26 mg in the daytime.  If necessary we can just have him take full tablet in the evening and one half tab in the morning. He had some lightheadedness dizziness, so I  am reluctant to titrate carvedilol further. Trying to maintain sinus rhythm with amiodarone. He is on digoxin, but no longer on eplerenone.  We could look into this in the future. We talked about sliding scale Lasix.  Since he is weight is up he needs to adjust until his weight goes down.  Gradually need to bring his weight down.  This would include use of Lasix plus or minus metolazone according to previous plan.  (40 mg Lasix twice daily and metolazone on Monday Wednesday and Friday, but also as needed). When he goes back to [facility], need to consider and admit him starting SGLT2 inhibitor Status post CRT-D placement.      Relevant Medications   EPLERENONE PO   Hyperlipidemia LDL goal <70 (Chronic)   Relevant Medications   EPLERENONE PO   Edema of right lower extremity due to peripheral venous insufficiency (Chronic)    Unlikely to have DVT.  He probably has venous stasis.  Discussed wearing knee-high's support stockings.      Relevant Medications   EPLERENONE PO   Thoracic aortic aneurysm (HCC) (Chronic)    Plan to recheck CT in early 2023.  Can order her next follow-up.      Relevant Medications   EPLERENONE PO   Tachycardia-bradycardia syndrome (HCC) (Chronic)    Now status post PPM with CRT-D/His bundle lead. Maintaining sinus rhythm as best possible with amiodarone.  Relevant Medications   EPLERENONE PO     Other   Drug-induced gynecomastia    He was switched from spironolactone to eplerenone.  However he is no longer on eplerenone for some reason.  Can readdress in follow-up.      On continuous oral anticoagulation (Chronic)    Mali Vascor is at least 4.  On Xarelto.  No major bleeding.  He did hold briefly for a few days of blood in his stool.  No longer having blood in the stool.  Okay to hold Xarelto 48 to 72 hours prior to procedures or surgeries.      Long term current use of amiodarone (Chronic)    Labs been followed.  Should be due for follow-up labs to be  ordered next visit with LFTs, TFTs, ESR and CRP.  He knows to have annual eye exams done. Will hold off on PFTs for this year but recheck next year.       ===================================  HPI:    Randall Best is a 70 y.o. male with a complicated cardiac history below who presents today for 60-monthfollow-up.  Jan 2019 - Afib RVR - TEE/DCCV (TIA) Underlying LBBB April 2020 - NONISCHEMIC CARDIOMYOPATHY - Combined CHF -Sx - Smothering @ night & increased edema. - Persistent Afib with LBBB 07/05/2019: Echo EF <20% => DCCV, converted to Carvedilol, Lasix,  07/14/2019: R&LHC -> normal Coronnaries. PCWP/LVEDP 17 mmHg. Lasix 40 mg BID, Digoxin, Spironolactone EP & CHF Referral -Amiodarone (to maintain NSR), Entresto added (Home wgt 204-205 lb) => facilitated DCCV 08/02/2019 F/u Echo 11/02/2019 - EF 35-40%, so NO ICD> Jan 2021 - recurrent HF Sx - added PRN Metolazone. June 2021: EP -> planned BiV PPM  08/01/2020 - ICD placed, but unable to place LV Lea 09/11/20 - (Dr. TLovena Le - LV Lead Inserted (? RVOT).   MELDO UMANZORwas last seen on May 11 via telemedicine in follow-up from April 24 visit where he was up to 238 pounds..[name] He indicated he was feeling much better.  He lost 11 pounds.  Much less dyspneic and the smothering had all that improved..  Minimal edema.  2D echo ordered for end of the year.. ->  Refer to [facility] for lipid management.  Has statin myopathy.  Started on low-dose rosuvastatin 5 mg. Somewhere along the line, he stopped taking eplerenone, and is not sure why.  Was seen on May 21, 2021 with [facility] lipid clinic.  The plan had been to try him started on GL[facility]  This was denied until he has other weight loss attempts plans tried.)  The plan was also to start PCSK9 inhibitor paperwork, but this has not yet occurred.  Recent Hospitalizations:  None  Reviewed  CV studies:    The following studies were reviewed today: (if available,  images/films reviewed: From Epic Chart or Care Everywhere) No new studies:   Interval History:   MDAOUDA LONZOreturns here today overall stating that he is feeling okay.  He says his dry weight at home was 235 pounds.  He says a smothering overall is better.  Just off and on.  Overall the dyspnea has improved but he still has some edema. He has had occasional episodes of dizziness and lightheadedness that is worse after bending down and standing back up again.  But he has not any syncope or near syncope.  He has not any symptoms to suggest recurrence of A. fib with no palpitations or irregular  heartbeats.  He has a little orthopnea but overall less notable smothering/PND symptoms.  Although his weights are up, and he has intermittent smothering, he really tries to exercise and do symptoms of activity at least 6 hours a day.  He is energy these all seem to be doing well and is not really noticing any adverse symptoms, he just is still gaining weight.  CV Review of Symptoms (Summary) Cardiovascular ROS: positive for - dyspnea on exertion, edema, orthopnea, paroxysmal nocturnal dyspnea, and shortness of breath negative for - chest pain, palpitations, rapid heart rate, or although he has some dizziness, no syncope/near syncope or TIA/emergency because, claudication  He also notes to be tied several different types of skin cancers removed over the last couple months.  Some on his back, 1 on his ear nose forehead etc.  Sounds like all of them are basal cells.  A few months ago he had an episode of blood in the stool likely related to a hemorrhoid.  He held his Xarelto for 2 days and stopped.  REVIEWED OF SYSTEMS   Review of Systems  Constitutional:  Negative for malaise/fatigue (Actually feeling pretty good.) and weight loss (Weight gain).  HENT:  Negative for congestion and nosebleeds.   Respiratory:  Positive for cough (Only when he has his "smothering symptoms "from CHF) and shortness of  breath (Per HPI).   Cardiovascular:  Positive for leg swelling and PND.  Gastrointestinal:  Positive for blood in stool (Per HPI.  He held Xarelto x2 days and it resolved.). Negative for abdominal pain and melena.  Genitourinary:  Negative for dysuria and hematuria.  Musculoskeletal:  Negative for joint pain and myalgias (Seems to be tolerating low-dose  Crestor.).  Neurological:  Positive for dizziness (If he bends over too fast.). Negative for focal weakness and seizures.  Psychiatric/Behavioral: Negative.     I have reviewed and (if needed) personally updated the patient's problem list, medications, allergies, past medical and surgical history, social and family history.   PAST MEDICAL HISTORY   Past Medical History:  Diagnosis Date   Asthma    as child   Atrial fibrillation, rapid ([place]) 12/08/2017   Diagnosed during colonoscopy -presumably new onset   BBB (bundle branch block)    GERD (gastroesophageal reflux disease)    use meds prn   Glaucoma    Headache    remote h/o migraines, none in years   Hyperlipidemia    NICM (nonischemic cardiomyopathy) ([place]) 04/2019   Echo 05/2019 - EF reduced to <20%.  Has permanent Afib & LBBB (normal Coronaries on Cath). -->  After cardioversion and titration medications, EF up to 35-40% with  global hypokinesis..   Thoracic aortic aneurysm ([place]) 10/20/2019   Ascending thoracic aortic estimated 44 mm on echo   TIA (transient ischemic attack) 01/13/2018   Ventral hernia     PAST SURGICAL HISTORY   Past Surgical History:  Procedure Laterality Date   BIV PACEMAKER INSERTION CRT-P N/A 08/01/2020   Procedure: BIV PACEMAKER INSERTION CRT-P;  Surgeon: Constance Haw, MD;  Location: [place] [facility];  Service: Cardiovascular;  Laterality: N/A;   CARDIAC EVENT MONITOR  12/2016   showed persistent Afib -monitor return in early because of persistent A. fib.  Several episodes of severe RVR with rates in the 170s were noted.    CARDIOVERSION N/A  01/13/2018   Procedure: CARDIOVERSION;  Surgeon: Pixie Casino, MD;  Location: [facility] ENDOSCOPY;  Service: Cardiovascular;  Laterality: N/A; unsuccessful.-->  Post-cardioversion, the patient  had left-sided facial numbness and weakness.  Ruled out for stroke.   CARDIOVERSION N/A 08/19/2019   Procedure: CARDIOVERSION;  Surgeon: Larey Dresser, MD;  Location: [facility] ENDOSCOPY;  Service: Cardiovascular;  Laterality: N/A;   INSERTION OF MESH N/A 07/22/2017   Procedure: INSERTION OF MESH;  Surgeon: [name], MD;  Location: [place];  Service: General;  Laterality: N/A;  BILATERAL TAP BLOCK   LEAD INSERTION N/A 09/11/2020   Procedure: LEAD INSERTION - LV LEAD;  Surgeon: Evans Lance, MD;  Location: [place] [facility];  Service: Cardiovascular;  Laterality: N/A;   NM MYOVIEW LTD  12/2017   Shows reduced EF, but no evidence of ischemia or infarction.  EF 40 and 45%.  INTERMEDIATE RISK due to reduced function.  EF notably better on echo (50 and 55%)   RIGHT/LEFT HEART CATH AND CORONARY ANGIOGRAPHY N/A 07/14/2019   Procedure: RIGHT/LEFT HEART CATH AND CORONARY ANGIOGRAPHY;  Surgeon: [name], MD;  Location: [place] [facility];; Angiographically normal coronary arteries. RHC  - PCWP & LVEDP 17 mmHg.  CO-CI 4.92-2.34   TEE WITHOUT CARDIOVERSION N/A 01/13/2018   Procedure: TRANSESOPHAGEAL ECHOCARDIOGRAM (TEE) with cardioversion - ;  Surgeon: Pixie Casino, MD;  Location: [facility] ENDOSCOPY;  Service: Cardiovascular;  Laterality: N/A;  Mild concentric LVH.  EF 55 to 60%.  No R WMA.  Mild ascending aortic dilation of 4.2 cm.  Dilated left atrium.  No thrombus.   TONSILLECTOMY     TRANSTHORACIC ECHOCARDIOGRAM  10/11/2019   EF up to 35-40%.-Moderate-severely decreased function.  "GR 1 DD ".  Normal atrial sizes.  Moderate ascending aorta-44 mL.  Relatively normal valves.   TRANSTHORACIC ECHOCARDIOGRAM  06/2019    Severely reduced function with diffuse hypokinesis and septal dyskinesis.  EF <20%.  Cannot exclude  LV thrombus.  Reduced RV function with mildly elevated RV P--37 mmHg.  Moderate RA and mild LA dilation.  A centimeter dilation 42 mm   VENTRAL HERNIA REPAIR N/A 07/22/2017   Procedure: OPEN VENTRAL HERNIA REPAIR WITH MESH ERAS PATHWAY;  Surgeon: [name], MD;  Location: [place];  Service: General;  Laterality: N/A;  BILATERAL TAP BLOCK    Immunization History  Administered Date(s) Administered   Influenza, High Dose Seasonal PF 11/24/2017, 07/15/2019, 07/20/2020   Influenza,inj,Quad PF,6+ Mos 08/04/2016   Influenza-Unspecified 11/24/2017   PFIZER Comirnaty(Gray Top)Covid-19 Tri-Sucrose Vaccine 02/09/2021   PFIZER(Purple Top)SARS-COV-2 Vaccination 06/27/2020   Pneumococcal Conjugate-13 07/27/2017    MEDICATIONS/ALLERGIES   Current Meds  Medication Sig   acetaminophen (TYLENOL) 500 MG tablet Take 500 mg by mouth every 6 (six) hours as needed for moderate pain.    albuterol (VENTOLIN HFA) 108 (90 Base) MCG/ACT inhaler Inhale 1 puff into the lungs every 4 (four) hours as needed for wheezing or shortness of breath.    amiodarone (PACERONE) 200 MG tablet Take 1 tablet (200 mg total) by mouth daily.   benzonatate (TESSALON) 100 MG capsule Take 100 mg by mouth 3 (three) times daily as needed for cough.   budesonide-formoterol (SYMBICORT) 160-4.5 MCG/ACT inhaler Inhale 2 puffs into the lungs in the morning and at bedtime.   carvedilol (COREG) 3.125 MG tablet Take 3.125 mg by mouth 2 (two) times daily with a meal.    Cholecalciferol (VITAMIN D) 50 MCG (2000 UT) tablet Take 2,000 Units by mouth daily.   digoxin (LANOXIN) 0.125 MG tablet TAKE 1 TABLET BY MOUTH DAILY   ENTRESTO 24-26 MG Take 1 tablet by mouth every morning.   furosemide (  LASIX) 40 MG tablet TAKE 1 TABLET BY MOUTH 2 TIMES DAILY. MAY TAKE AN ADDITIONAL DOSE IF WT IS GREATER THAN 3 LBS DAILY.   hydrocortisone (ANUSOL-HC) 25 MG suppository Place 25 mg rectally 2 (two) times daily as needed for hemorrhoids or anal itching.    metolazone (ZAROXOLYN) 2.5 MG tablet TAKE 1 TABLET BY MOUTH EVERY MONDAY, WEDNESDAY, AND FRIDAY. 30 MINUTES PRIOR TO YOUR FUROSEMIDE DOSE   Multiple Vitamins-Minerals (ULTRA MENS PACK PO) Take 1 tablet by mouth daily.   mupirocin ointment (BACTROBAN) 2 % Apply 1 application topically daily.   nitroGLYCERIN (NITROSTAT) 0.4 MG SL tablet Place 1 tablet (0.4 mg total) under the tongue every 5 (five) minutes as needed for chest pain.   Omega-3 Fatty Acids (FISH OIL) 1200 MG CAPS Take 1,200 mg by mouth every other day.   omeprazole (PRILOSEC) 20 MG capsule TAKE 1 CAPSULE BY MOUTH EVERY DAY   potassium chloride SA (KLOR-CON) 20 MEQ tablet Take 20 mEq by mouth 2 (two) times daily.   rosuvastatin (CRESTOR) 5 MG tablet Take 1 tablet (5 mg total) by mouth at bedtime.   sacubitril-valsartan (ENTRESTO) 49-51 MG Take 1 tablet by mouth at bedtime. ,per Dr Ellyn Hack   triamcinolone cream (KENALOG) 0.1 % Apply 1 application topically as needed.   XARELTO 20 MG TABS tablet TAKE 1 TABLET (20 MG TOTAL) BY MOUTH DAILY WITH SUPPER.    Allergies  Allergen Reactions   Statins Other (See Comments)    Muscle pains, mouth sores   Zetia [Ezetimibe] Other (See Comments)    Broke out in sores/ muscle pain   Crestor [Rosuvastatin]     myalgias   Lipitor [Atorvastatin] Other (See Comments)    Knee pain   Livalo [Pitavastatin] Other (See Comments)    Muscle pain   Spironolactone     Soreness   Vaseline [Petrolatum]     Break out   Zocor [Simvastatin]     myalgias   Iodine Swelling and Rash   Latex Rash    Bandages not gloves    SOCIAL HISTORY/FAMILY HISTORY   Reviewed in Epic:  Pertinent findings:  Social History   Tobacco Use   Smoking status: Never   Smokeless tobacco: Never  Vaping Use   Vaping Use: Never used  Substance Use Topics   Alcohol use: Yes    Comment: Rarely   Drug use: No   Social History   Social History Narrative   He is a married father of 42, grandfather of 1.     Lives with  his wife.      Now started back in an exercise routine trying to exercise least 30 minutes a day, and doing outdoor activities including yard work/landscaping and planting.    OBJCTIVE -PE, EKG, labs   Wt Readings from Last 3 Encounters:  07/18/21 240 lb (108.9 kg)  05/21/21 240 lb 9.6 oz (109.1 kg)  03/20/21 230 lb (104.3 kg)    Physical Exam: BP 134/80 (BP Location: Left Arm, Patient Position: Sitting, Cuff Size: Normal)   Pulse 80   Ht _0  (1.753 m)   Wt 240 lb (108.9 kg)   BMI 35.44 kg/m  Physical Exam Vitals reviewed.  Constitutional:      General: He is not in acute distress.    Appearance: He is well-developed. He is obese. He is not ill-appearing.     Comments: From his most recent relatively stable weights of 225 to 230 pounds he is up.  Just a year  and a half ago his dry weight was 205 pounds.  HENT:     Head: Normocephalic and atraumatic.  Neck:     Vascular: JVD (JVP roughly 8-10 cmH2O) present. No carotid bruit.  Cardiovascular:     Rate and Rhythm: Normal rate and regular rhythm. No extrasystoles are present.    Chest Wall: PMI is displaced (Mildly lateral, sustained).     Pulses: Normal pulses.     Heart sounds: S1 normal and S2 normal. Heart sounds are distant. No murmur heard.   Gallop present. S4 sounds present.  Pulmonary:     Effort: Pulmonary effort is normal. No respiratory distress.     Breath sounds: Rales (Trivial bibasal rales/crackles) present. No wheezing or rhonchi.  Musculoskeletal:        General: Swelling (At least 1+ 2+ bilateral ankle edema) present.     Cervical back: Normal range of motion.  Skin:    General: Skin is warm and dry.     Findings: No rash.  Neurological:     General: No focal deficit present.     Mental Status: He is alert and oriented to person, place, and time.     Gait: Gait normal.  Psychiatric:        Mood and Affect: Mood normal.        Behavior: Behavior normal.        Thought Content: Thought content  normal.        Judgment: Judgment normal.     Adult ECG Report N/a  Recent Labs: Reviewed Lab Results  Component Value Date   CHOL 188 05/06/2021   HDL 47 05/06/2021   LDLCALC 113 (H) 05/06/2021   TRIG 161 (H) 05/06/2021   CHOLHDL 4.0 05/06/2021   Lab Results  Component Value Date   CREATININE 1.09 05/06/2021   BUN 20 05/06/2021   NA 142 05/06/2021   K 4.1 05/06/2021   CL 104 05/06/2021   CO2 23 05/06/2021    CBC Latest Ref Rng & Units 03/05/2021 01/31/2021 09/04/2020  WBC 4.0 - 10.5 K/uL 7.4 6.8 11.2(H)  Hemoglobin 13.0 - 17.0 g/dL 13.8 13.6 12.5(L)  Hematocrit 39.0 - 52.0 % 41.4 41.6 38.1  Platelets 150.0 - 400.0 K/uL 194.0 209 173    Lab Results  Component Value Date   HGBA1C 5.6 01/14/2018   Lab Results  Component Value Date   TSH 1.560 01/31/2021    ==================================================  COVID-19 Education: The signs and symptoms of COVID-19 were discussed with the patient and how to seek care for testing (follow up with PCP or arrange E-visit).    I spent a total of 30 minutes with the patient spent in direct patient consultation.  Additional time spent with chart review  / charting (studies, outside notes, etc): 16 min Total Time: 46 min  Current medicines are reviewed at length with the patient today.  (+/- concerns) n/a  This visit occurred during the SARS-CoV-2 public health emergency.  Safety protocols were in place, including screening questions prior to the visit, additional usage of staff PPE, and extensive cleaning of exam room while observing appropriate contact time as indicated for disinfecting solutions.  Notice: This dictation was prepared with Dragon dictation along with smaller phrase technology. Any transcriptional errors that result from this process are unintentional and may not be corrected upon review.  Patient Instructions / Medication Changes & Studies & Tests Ordered   Patient Instructions  Medication Instructions:    You  are 5 lb up from  dry weight  Double taking furosemide   along with  Metolazone prior  If weight is still up on Monday  double furosemide again with Metolazone  Use the sliding scale  previous instructions  If you wake up with smothering sensation - take an extra furosemide the next morning        *If you need a refill on your cardiac medications before your next appointment, please call your pharmacy*   Lab Work: Not needed     Testing/Procedures:  Not needed  Follow-Up: At [facility], you and your health needs are our priority.  As part of our continuing mission to provide you with exceptional heart care, we have created designated Provider Care Teams.  These Care Teams include your primary Cardiologist (physician) and Advanced Practice Providers (APPs -  Physician Assistants and Nurse Practitioners) who all work together to provide you with the care you need, when you need it.     Your next appointment:   4 month(s)  The format for your next appointment:   In Person  Provider:   [name], MD   Other Instructions    Studies Ordered:   No orders of the defined types were placed in this encounter.    [name], M.D., M.S. Interventional Cardiologist   Pager # [phone] Phone # [phone] [address] [place], [place] [address]   Thank you for choosing Heartcare at [facility]!!

## 2021-07-18 NOTE — Patient Instructions (Addendum)
Medication Instructions:   You  are 5 lb up from dry weight  Double taking furosemide   along with  Metolazone prior  If weight is still up on Monday  double furosemide again with Metolazone  Use the sliding scale  previous instructions  If you wake up with smothering sensation - take an extra furosemide the next morning        *If you need a refill on your cardiac medications before your next appointment, please call your pharmacy*   Lab Work: Not needed     Testing/Procedures:  Not needed  Follow-Up: At [facility], you and your health needs are our priority.  As part of our continuing mission to provide you with exceptional heart care, we have created designated Provider Care Teams.  These Care Teams include your primary Cardiologist (physician) and Advanced Practice Providers (APPs -  Physician Assistants and Nurse Practitioners) who all work together to provide you with the care you need, when you need it.     Your next appointment:   4 month(s)  The format for your next appointment:   In Person  Provider:   [name], MD   Other Instructions

## 2021-07-21 ENCOUNTER — Encounter: Payer: Self-pay | Admitting: Cardiology

## 2021-07-21 NOTE — Assessment & Plan Note (Signed)
Now status post PPM with CRT-D/His bundle lead. Maintaining sinus rhythm as best possible with amiodarone.

## 2021-07-21 NOTE — Assessment & Plan Note (Signed)
Plan to recheck CT in early 2023.  Can order her next follow-up.

## 2021-07-22 ENCOUNTER — Other Ambulatory Visit: Payer: Self-pay | Admitting: Cardiology

## 2021-07-22 DIAGNOSIS — I420 Dilated cardiomyopathy: Secondary | ICD-10-CM

## 2021-07-22 DIAGNOSIS — I5042 Chronic combined systolic (congestive) and diastolic (congestive) heart failure: Secondary | ICD-10-CM

## 2021-07-22 NOTE — Assessment & Plan Note (Signed)
Unlikely to have DVT.  He probably has venous stasis.  Discussed wearing knee-high's support stockings.

## 2021-07-22 NOTE — Assessment & Plan Note (Signed)
Now status post BiV ICD with standing amiodarone to maintain sinus rhythm.  Seems to be doing relatively well along with a combination of digoxin and carvedilol.  On Xarelto with minimal bleeding.  He knows to hold briefly if there are bleeds.

## 2021-07-22 NOTE — Assessment & Plan Note (Signed)
Mali Vascor is at least 4.  On Xarelto.  No major bleeding.  He did hold briefly for a few days of blood in his stool.  No longer having blood in the stool.   Okay to hold Xarelto 48 to 72 hours prior to procedures or surgeries.

## 2021-07-22 NOTE — Assessment & Plan Note (Signed)
He definitely feels better since he made adjustments, but his weights are still up.  I am still concerned and I do agree that GLP-1 agonist would be great for the cardiovascular benefit.  Perhaps Rybelsus or Ozempic as opposed to [employer]. He is on a pretty stable regimen, but his weight is up today.  I have asked that he double up his furosemide along with metolazone until back down to dry weight these 2 and 30 pounds.  Would like to try to get him down to 220. Symptoms are probably NYHA class II.  Plan:  He initially had some issues with split dosing of Entresto at 4950 1 in the evening and 24-26 mg in the daytime.  If necessary we can just have him take full tablet in the evening and one half tab in the morning.  He had some lightheadedness dizziness, so I am reluctant to titrate carvedilol further.  Trying to maintain sinus rhythm with amiodarone.  He is on digoxin, but no longer on eplerenone.  We could look into this in the future.  We talked about sliding scale Lasix.  Since he is weight is up he needs to adjust until his weight goes down.  Gradually need to bring his weight down.  This would include use of Lasix plus or minus metolazone according to previous plan.  (40 mg Lasix twice daily and metolazone on Monday Wednesday and Friday, but also as needed).  When he goes back to [facility], need to consider and admit him starting SGLT2 inhibitor  Status post CRT-D placement.

## 2021-07-22 NOTE — Assessment & Plan Note (Signed)
Labs been followed.  Should be due for follow-up labs to be ordered next visit with LFTs, TFTs, ESR and CRP.  He knows to have annual eye exams done. Will hold off on PFTs for this year but recheck next year.

## 2021-07-22 NOTE — Assessment & Plan Note (Signed)
He was switched from spironolactone to eplerenone.  However he is no longer on eplerenone for some reason.  Can readdress in follow-up.

## 2021-07-23 ENCOUNTER — Other Ambulatory Visit: Payer: Self-pay | Admitting: Cardiology

## 2021-07-31 ENCOUNTER — Other Ambulatory Visit: Payer: Self-pay | Admitting: Student

## 2021-08-01 ENCOUNTER — Ambulatory Visit (INDEPENDENT_AMBULATORY_CARE_PROVIDER_SITE_OTHER): Payer: 59

## 2021-08-01 DIAGNOSIS — I495 Sick sinus syndrome: Secondary | ICD-10-CM | POA: Diagnosis not present

## 2021-08-01 LAB — CUP PACEART REMOTE DEVICE CHECK
Battery Remaining Longevity: 117 mo
Battery Voltage: 3.02 V
Brady Statistic AP VP Percent: 81.67 %
Brady Statistic AP VS Percent: 0.11 %
Brady Statistic AS VP Percent: 18.15 %
Brady Statistic AS VS Percent: 0.07 %
Brady Statistic RA Percent Paced: 81.76 %
Brady Statistic RV Percent Paced: 99.82 %
Date Time Interrogation Session: 20220921215640
Implantable Lead Implant Date: 20210922
Implantable Lead Implant Date: 20211102
Implantable Lead Implant Date: 20211102
Implantable Lead Location: 753858
Implantable Lead Location: 753859
Implantable Lead Location: 753860
Implantable Lead Model: 3830
Implantable Lead Model: 4196
Implantable Lead Model: 5076
Implantable Pulse Generator Implant Date: 20210922
Lead Channel Impedance Value: 304 Ohm
Lead Channel Impedance Value: 380 Ohm
Lead Channel Impedance Value: 418 Ohm
Lead Channel Impedance Value: 513 Ohm
Lead Channel Impedance Value: 513 Ohm
Lead Channel Impedance Value: 551 Ohm
Lead Channel Impedance Value: 570 Ohm
Lead Channel Impedance Value: 703 Ohm
Lead Channel Impedance Value: 836 Ohm
Lead Channel Pacing Threshold Amplitude: 0.75 V
Lead Channel Pacing Threshold Amplitude: 0.75 V
Lead Channel Pacing Threshold Pulse Width: 0.4 ms
Lead Channel Pacing Threshold Pulse Width: 0.4 ms
Lead Channel Sensing Intrinsic Amplitude: 2.875 mV
Lead Channel Sensing Intrinsic Amplitude: 2.875 mV
Lead Channel Sensing Intrinsic Amplitude: 9.875 mV
Lead Channel Sensing Intrinsic Amplitude: 9.875 mV
Lead Channel Setting Pacing Amplitude: 1.5 V
Lead Channel Setting Pacing Amplitude: 2 V
Lead Channel Setting Pacing Amplitude: 2 V
Lead Channel Setting Pacing Pulse Width: 0.4 ms
Lead Channel Setting Pacing Pulse Width: 0.4 ms
Lead Channel Setting Sensing Sensitivity: 0.9 mV

## 2021-08-07 NOTE — Progress Notes (Signed)
Remote pacemaker transmission.   

## 2021-08-12 ENCOUNTER — Other Ambulatory Visit: Payer: Self-pay | Admitting: Cardiology

## 2021-08-13 ENCOUNTER — Telehealth: Payer: Self-pay | Admitting: Pharmacist Clinician (PhC)/ Clinical Pharmacy Specialist

## 2021-08-13 NOTE — Telephone Encounter (Signed)
Patient called to report that he was told by insurer they will no longer be covering amiodarone.

## 2021-08-16 NOTE — Telephone Encounter (Signed)
That does not make any sense?  [place]

## 2021-08-21 ENCOUNTER — Other Ambulatory Visit: Payer: Self-pay | Admitting: Cardiology

## 2021-08-21 NOTE — Telephone Encounter (Signed)
RN called and spoke to patient and wife.  Both states the initial call from an automated request for refill of Amiodarone 200 mg daily - one tablet  Patient states he received a letter  about the medication but does not have it at present.  Both states patient has enough medication until NOV 2022. RN informed patient and wife to contact  pharmacy closer to  [facility] to refill ( try to refill at least 2 to 3 weeks if possible)  . If they are unable to refill contact office.  both verbalized understanding

## 2021-08-21 NOTE — Telephone Encounter (Signed)
Called an spoke to [employer] [facility] - she checked patient profile - She was unable to process whether  insurance would pay for Amiodarone. Prescription will not be ready to fill  until NOv. 2023

## 2021-08-26 ENCOUNTER — Other Ambulatory Visit: Payer: Self-pay

## 2021-08-26 DIAGNOSIS — E785 Hyperlipidemia, unspecified: Secondary | ICD-10-CM

## 2021-08-26 MED ORDER — PRALUENT 150 MG/ML ~~LOC~~ SOAJ: 150.0000 mg | SUBCUTANEOUS | 11 refills | Status: DC

## 2021-09-06 ENCOUNTER — Other Ambulatory Visit: Payer: Self-pay | Admitting: Cardiology

## 2021-10-10 DIAGNOSIS — Z85828 Personal history of other malignant neoplasm of skin: Secondary | ICD-10-CM | POA: Diagnosis not present

## 2021-10-10 DIAGNOSIS — R221 Localized swelling, mass and lump, neck: Secondary | ICD-10-CM | POA: Diagnosis not present

## 2021-10-14 ENCOUNTER — Other Ambulatory Visit: Payer: Self-pay | Admitting: Otolaryngology

## 2021-10-14 DIAGNOSIS — Z85828 Personal history of other malignant neoplasm of skin: Secondary | ICD-10-CM

## 2021-10-14 DIAGNOSIS — R221 Localized swelling, mass and lump, neck: Secondary | ICD-10-CM

## 2021-10-23 ENCOUNTER — Ambulatory Visit (HOSPITAL_COMMUNITY): Payer: Medicare Other | Attending: Cardiology

## 2021-10-23 ENCOUNTER — Other Ambulatory Visit: Payer: Self-pay

## 2021-10-23 DIAGNOSIS — I420 Dilated cardiomyopathy: Secondary | ICD-10-CM

## 2021-10-23 DIAGNOSIS — I4819 Other persistent atrial fibrillation: Secondary | ICD-10-CM | POA: Insufficient documentation

## 2021-10-23 DIAGNOSIS — I5042 Chronic combined systolic (congestive) and diastolic (congestive) heart failure: Secondary | ICD-10-CM | POA: Diagnosis not present

## 2021-10-23 LAB — ECHOCARDIOGRAM COMPLETE
Area-P 1/2: 2.5 cm2
S' Lateral: 2.6 cm

## 2021-10-24 DIAGNOSIS — H26491 Other secondary cataract, right eye: Secondary | ICD-10-CM | POA: Diagnosis not present

## 2021-10-24 DIAGNOSIS — H16223 Keratoconjunctivitis sicca, not specified as Sjogren's, bilateral: Secondary | ICD-10-CM | POA: Diagnosis not present

## 2021-10-24 DIAGNOSIS — H43812 Vitreous degeneration, left eye: Secondary | ICD-10-CM | POA: Diagnosis not present

## 2021-10-24 DIAGNOSIS — H35343 Macular cyst, hole, or pseudohole, bilateral: Secondary | ICD-10-CM | POA: Diagnosis not present

## 2021-10-31 ENCOUNTER — Ambulatory Visit (INDEPENDENT_AMBULATORY_CARE_PROVIDER_SITE_OTHER): Payer: Medicare Other

## 2021-10-31 DIAGNOSIS — I495 Sick sinus syndrome: Secondary | ICD-10-CM | POA: Diagnosis not present

## 2021-10-31 LAB — CUP PACEART REMOTE DEVICE CHECK
Battery Remaining Longevity: 112 mo
Battery Voltage: 3.02 V
Brady Statistic AP VP Percent: 77.81 %
Brady Statistic AP VS Percent: 1.08 %
Brady Statistic AS VP Percent: 20.47 %
Brady Statistic AS VS Percent: 0.63 %
Brady Statistic RA Percent Paced: 79 %
Brady Statistic RV Percent Paced: 98.28 %
Date Time Interrogation Session: 20221221213220
Implantable Lead Implant Date: 20210922
Implantable Lead Implant Date: 20211102
Implantable Lead Implant Date: 20211102
Implantable Lead Location: 753858
Implantable Lead Location: 753859
Implantable Lead Location: 753860
Implantable Lead Model: 3830
Implantable Lead Model: 4196
Implantable Lead Model: 5076
Implantable Pulse Generator Implant Date: 20210922
Lead Channel Impedance Value: 342 Ohm
Lead Channel Impedance Value: 418 Ohm
Lead Channel Impedance Value: 418 Ohm
Lead Channel Impedance Value: 437 Ohm
Lead Channel Impedance Value: 532 Ohm
Lead Channel Impedance Value: 570 Ohm
Lead Channel Impedance Value: 608 Ohm
Lead Channel Impedance Value: 741 Ohm
Lead Channel Impedance Value: 893 Ohm
Lead Channel Pacing Threshold Amplitude: 0.5 V
Lead Channel Pacing Threshold Amplitude: 0.75 V
Lead Channel Pacing Threshold Pulse Width: 0.4 ms
Lead Channel Pacing Threshold Pulse Width: 0.4 ms
Lead Channel Sensing Intrinsic Amplitude: 2.125 mV
Lead Channel Sensing Intrinsic Amplitude: 2.125 mV
Lead Channel Sensing Intrinsic Amplitude: 9.875 mV
Lead Channel Sensing Intrinsic Amplitude: 9.875 mV
Lead Channel Setting Pacing Amplitude: 1.5 V
Lead Channel Setting Pacing Amplitude: 2 V
Lead Channel Setting Pacing Amplitude: 2 V
Lead Channel Setting Pacing Pulse Width: 0.4 ms
Lead Channel Setting Pacing Pulse Width: 0.4 ms
Lead Channel Setting Sensing Sensitivity: 0.9 mV

## 2021-11-06 ENCOUNTER — Other Ambulatory Visit: Payer: Self-pay

## 2021-11-06 ENCOUNTER — Ambulatory Visit
Admission: RE | Admit: 2021-11-06 | Discharge: 2021-11-06 | Disposition: A | Discharge status: Home / Self Care | Payer: Medicare Other | Source: Ambulatory Visit | Attending: Otolaryngology | Admitting: Otolaryngology

## 2021-11-06 DIAGNOSIS — R221 Localized swelling, mass and lump, neck: Secondary | ICD-10-CM

## 2021-11-06 DIAGNOSIS — M47812 Spondylosis without myelopathy or radiculopathy, cervical region: Secondary | ICD-10-CM | POA: Diagnosis not present

## 2021-11-06 DIAGNOSIS — Z85828 Personal history of other malignant neoplasm of skin: Secondary | ICD-10-CM

## 2021-11-06 MED ORDER — IOPAMIDOL (ISOVUE-300) INJECTION 61%
70.0000 mL | Freq: Once | INTRAVENOUS | Status: AC | PRN
  Administered 2021-11-06: 17:00:00 70 mL via INTRAVENOUS

## 2021-11-08 NOTE — Progress Notes (Signed)
Remote pacemaker transmission.   

## 2021-11-10 HISTORY — PX: OTHER SURGICAL HISTORY: SHX169

## 2021-11-20 ENCOUNTER — Other Ambulatory Visit: Payer: Self-pay | Admitting: Internal Medicine

## 2021-11-20 DIAGNOSIS — E785 Hyperlipidemia, unspecified: Secondary | ICD-10-CM | POA: Diagnosis not present

## 2021-11-20 DIAGNOSIS — Z7901 Long term (current) use of anticoagulants: Secondary | ICD-10-CM

## 2021-11-20 LAB — LIPID PANEL
Chol/HDL Ratio: 2.3 ratio (ref 0.0–5.0)
Cholesterol, Total: 102 mg/dL (ref 100–199)
HDL: 45 mg/dL (ref >39)
LDL Chol Calc (NIH): 34 mg/dL (ref 0–99)
Triglycerides: 130 mg/dL (ref 0–149)
VLDL Cholesterol Cal: 23 mg/dL (ref 5–40)

## 2021-11-22 DIAGNOSIS — Z85828 Personal history of other malignant neoplasm of skin: Secondary | ICD-10-CM | POA: Diagnosis not present

## 2021-11-22 DIAGNOSIS — R221 Localized swelling, mass and lump, neck: Secondary | ICD-10-CM | POA: Diagnosis not present

## 2021-11-26 DIAGNOSIS — H26491 Other secondary cataract, right eye: Secondary | ICD-10-CM | POA: Diagnosis not present

## 2021-11-26 DIAGNOSIS — H18413 Arcus senilis, bilateral: Secondary | ICD-10-CM | POA: Diagnosis not present

## 2021-11-26 DIAGNOSIS — H02831 Dermatochalasis of right upper eyelid: Secondary | ICD-10-CM | POA: Diagnosis not present

## 2021-11-26 DIAGNOSIS — Z961 Presence of intraocular lens: Secondary | ICD-10-CM | POA: Diagnosis not present

## 2021-11-26 DIAGNOSIS — H26493 Other secondary cataract, bilateral: Secondary | ICD-10-CM | POA: Diagnosis not present

## 2021-12-02 DIAGNOSIS — H26491 Other secondary cataract, right eye: Secondary | ICD-10-CM | POA: Diagnosis not present

## 2021-12-04 ENCOUNTER — Other Ambulatory Visit: Payer: Self-pay

## 2021-12-04 ENCOUNTER — Ambulatory Visit: Payer: Medicare Other | Admitting: Cardiology

## 2021-12-04 ENCOUNTER — Encounter: Payer: Self-pay | Admitting: Cardiology

## 2021-12-04 VITALS — BP 110/78 | HR 65 | Ht 69.0 in | Wt 246.6 lb

## 2021-12-04 DIAGNOSIS — I7121 Aneurysm of the ascending aorta, without rupture: Secondary | ICD-10-CM | POA: Diagnosis not present

## 2021-12-04 DIAGNOSIS — Z7901 Long term (current) use of anticoagulants: Secondary | ICD-10-CM

## 2021-12-04 DIAGNOSIS — D6869 Other thrombophilia: Secondary | ICD-10-CM | POA: Diagnosis not present

## 2021-12-04 DIAGNOSIS — E785 Hyperlipidemia, unspecified: Secondary | ICD-10-CM

## 2021-12-04 DIAGNOSIS — I872 Venous insufficiency (chronic) (peripheral): Secondary | ICD-10-CM | POA: Diagnosis not present

## 2021-12-04 DIAGNOSIS — I495 Sick sinus syndrome: Secondary | ICD-10-CM | POA: Diagnosis not present

## 2021-12-04 DIAGNOSIS — Z79899 Other long term (current) drug therapy: Secondary | ICD-10-CM | POA: Diagnosis not present

## 2021-12-04 DIAGNOSIS — I5042 Chronic combined systolic (congestive) and diastolic (congestive) heart failure: Secondary | ICD-10-CM | POA: Diagnosis not present

## 2021-12-04 DIAGNOSIS — I420 Dilated cardiomyopathy: Secondary | ICD-10-CM

## 2021-12-04 DIAGNOSIS — I4819 Other persistent atrial fibrillation: Secondary | ICD-10-CM | POA: Diagnosis not present

## 2021-12-04 NOTE — Progress Notes (Signed)
Primary Care Provider: Ramiro Harvest, PA-C Cardiologist: [name], MD Electrophysiologist: Constance Haw, MD  Clinic Note: Chief Complaint  Patient presents with   Follow-up    26-month  Cardiomyopathy    Still has "smothering ", but overall exertional dyspnea has improved.    ===================================  ASSESSMENT/PLAN   Problem List Items Addressed This Visit       Cardiology Problems   Persistent atrial fibrillation ([facility]: CHA2DS2-VASc Score 4 (CHF, HTN, Age 71 Aortic Plaque) (Chronic)    CRT-P with HIS bundle lead (unable to place LV lead). Rate/rhythm control with combination of amiodarone and digoxin. Also on carvedilol low-dose for rate control.  (3.25 mg twice daily.  On Xarelto for anticoagulation.  Stable with no bleeding.      Relevant Orders   EKG 12-Lead   C-reactive protein (Completed)   TSH (Completed)   Sedimentation rate (Completed)   Nonischemic dilated cardiomyopathy (HCC) (Chronic)    Thought to be related to combination of LBBB and A. fib.  Unfortunately EF did not improve despite CRT-P.  Blood pressure.  Makes it difficult to titrate up doses of Entresto and carvedilol.  Currently taking low-dose carvedilol 3.25 mg twice daily as well as Entresto with split dosing 24-26 mg in the morning and 49-51 mg in the evening.  Also on digoxin On furosemide plus intermittent dosing and as needed metolazone .  Unfortunate, follow-up echocardiogram still shows EF 40%.      Chronic combined systolic and diastolic heart failure (HCC) - Primary (Chronic)    Chronic HFrEF NYHA II-III symptoms.  Probably still class II based on the lack of hospitalization, however he still has "smothering sensations: / with orthopnea and PND.  Exertional dyspnea is also present.  He has generalized fatigue but he does continue to stay active.  I think a lot of his symptoms are exacerbated by the fact that he is now 40 pounds heavier than he was when we  started all of this.  This weight gain is all old truncal obesity and not necessarily fluid because his edema is pretty well controlled.  Plan: Continue current low-dose carvedilol 3.25 mg twice daily along with digoxin 0.125 mg daily. Continue split dosing of Entresto 24-26 AM & 49-51 mg PM On eplerenone 25 mg Furosemide taking 40 mg twice daily -> also able to take additional dose PRN weight gain greater than 3 pounds. Also uses metolazone 3 days a week prior to furosemide. I have asked that he double up his furosemide dosing at least once a week to take 2 tabs in the morning   Stressed importance of weight loss. -> If we are able to get him covered with Praluent, we will try again to work on getting [employer] for [employer](GLP1-agonists) for Wgt Loss. Really needs appetite suppression.      Relevant Orders   EKG 12-Lead   C-reactive protein (Completed)   TSH (Completed)   Sedimentation rate (Completed)   Hyperlipidemia LDL goal <70 (Chronic)    Labs been outstanding with the addition of Praluent, he is only able to tolerate regular doses of rosuvastatin.  Unfortunately we are now having to go through the process of planning the insurance company game because of changes.  We will have CVRR team reach out to him to ensure that it is covered versus switch to RVienna  I think he may just need a prior authorization resubmitted.      Edema of right lower extremity due to peripheral  venous insufficiency (Chronic)   Relevant Orders   EKG 12-Lead   Thoracic aortic aneurysm (Chronic)    Has been stable on echo.  We will reassess with CTA later this year after next visit.  Respect modification with blood pressure and lipid control.  Also need to monitor for potential development of diabetes given his obesity.      Relevant Orders   EKG 12-Lead   C-reactive protein (Completed)   TSH (Completed)   Sedimentation rate (Completed)   Tachycardia-bradycardia syndrome (HCC) (Chronic)    S/p  ~CRT-P with RVOT (HIS BUNDLE) lead.  On Amiodarone for rhythm/A. fib suppression.        Other   Secondary hypercoagulable state (HCC)-due to atrial fibrillation (Chronic)    On Xarelto for A. fib, but also has had potentially evidence of LV thrombus in the past.  Tolerating well.  No bleeding issues.      On continuous oral anticoagulation (Chronic)   Long term current use of amiodarone (Chronic)    Due for follow-up LFTs, TFTs ESR and CRP.  Will need to also consider PFTs this year.  Order at next visit.      Relevant Orders   EKG 12-Lead   C-reactive protein (Completed)   TSH (Completed)   Sedimentation rate (Completed)    ===================================  HPI:    Randall Best is a 71 y.o. male with a PMH notable for an ICM (thought to be related to A. fib and LBBB, no CAD on cath) with CHRONIC HFrEF, Persistent A. fib with Tachybradycardia Syndrome (s/p PPM, and HLD ) who presents today for 52-monthfollow-up.  Initial diagnosis with A. fib RVR January 2019 -> TEE DCCV complicated by mild TIA Underlying LBBB April 2020: Developed and ICM with combined CHF-symptom is smothering at night with decreasing dyspnea and exertional dyspnea. 07/05/2019 -> noted to have persistent A. fib with LBBB.  EF<20% by Echo => DCCV, added carvedilol and Lasix 07/14/2019:R&LHC -> normal Cors _> initiated amiodarone to maintain NSR, added spironolactone-converted to eplerenone because of gynecomastia (at that time his dry weight was 240 205 pounds) => facilitated DCCV 08/02/2019 Echo 11/09/2019: EF 35 to 40%, therefore no ICD ->  Attempted CRT-P in June 2021, unable to place LV lead -> RVOT lead inserted 09/11/2020 by Dr. TMaryruth Hancockwas last seen on 07/18/2021: Feeling okay.  He was down about 235 pounds at home.  Smothering sensation was still present off and on but overall better.  Exertional dyspnea and edema improving.  Occasional dizziness and lightheadedness-worse with  bending down).  Nothing to suggest recurrence of A. Fib. => NYHA Class II CHF -> Plan was to potentially consider GLP-1 agonist such as [id number] assisted weight loss.  Recent Hospitalizations: none  Reviewed  CV studies:    The following studies were reviewed today: (if available, images/films reviewed: From Epic Chart or Care Everywhere) TTE 10/23/2021: EF 40%.  GR 1 DD.  Mild LVH.  Unable to assess PAP.  Aortic valve sclerosis with no stenosis.  Normal RAP.  Mild TA dilation.:  Interval History:   Randall GINLEYreturns today in his usual positive up the state of mind, but he has been putting on a lot of weight.  She says he just keeps eating and has a hard time curbing his appetite.  As such, he continues to gain weight.  His weight is notably up from where it had been before.  He therefore is admits that he is  a little more sedentary not as active as he had been but is still working and doing things during most of the day.    He does not get little easy fatigue and dyspnea.  He says a smothering sensation, especially at night is notably improved.  Is still there but less frequently maybe 2 or 3 times a week.  He is able to sleep at 45  versus 60 .  He does have exertional dyspnea going up stairs or up an incline.  If he is carrying anything he will also get short of breath.  He denies any sensation of irregular heartbeats or palpitations.  CV Review of Symptoms (Summary) Cardiovascular ROS: positive for - dyspnea on exertion, edema, orthopnea, paroxysmal nocturnal dyspnea, shortness of breath, and positional dizziness negative for - chest pain, irregular heartbeat, palpitations, rapid heart rate, or syncope/near syncope or TIA/amaurosis fugax of claudication  He indicated that he received a letter from his insurance company stating that the Medicare would not cover Praluent any further.  The cost is $400 a month.  Apparently they need prior authorization.  REVIEWED OF SYSTEMS    Pertinent symptoms noted above.  Otherwise: Review of Systems  Constitutional:  Negative for malaise/fatigue (Has reduced exercise tolerance, but relatively stable.) and weight loss (Weight gain).  HENT:  Negative for nosebleeds.   Eyes:  Positive for pain and discharge.  Respiratory:  Positive for cough and shortness of breath (Per HPI).   Cardiovascular:  Positive for leg swelling (Not all that much). Negative for chest pain and claudication.  Gastrointestinal:  Negative for blood in stool and melena.  Genitourinary:  Negative for hematuria.  Musculoskeletal:  Positive for joint pain. Negative for neck pain (Not neck pain, but he has a left-sided neck mass that is being evaluated.).  Neurological:  Positive for dizziness (Positional-worse with bending over and standing back up) and weakness.  Psychiatric/Behavioral:  Negative for depression and memory loss. The patient is nervous/anxious. The patient does not have insomnia.    I have reviewed and (if needed) personally updated the patient's problem list, medications, allergies, past medical and surgical history, social and family history.   PAST MEDICAL HISTORY   Past Medical History:  Diagnosis Date   Asthma    as child   Atrial fibrillation, rapid ([place]) 12/08/2017   Diagnosed during colonoscopy -presumably new onset   BBB (bundle branch block)    GERD (gastroesophageal reflux disease)    use meds prn   Glaucoma    Headache    remote h/o migraines, none in years   Hyperlipidemia    NICM (nonischemic cardiomyopathy) ([place]) 04/2019   Echo 05/2019 - EF reduced to <20%.  Has permanent Afib & LBBB (normal Coronaries on Cath). -->  After cardioversion and titration medications, EF up to 35-40% with  global hypokinesis..   Thoracic aortic aneurysm 10/20/2019   Ascending thoracic aortic estimated 44 mm on echo   TIA (transient ischemic attack) 01/13/2018   Ventral hernia     PAST SURGICAL HISTORY   Past Surgical History:   Procedure Laterality Date   BIV PACEMAKER INSERTION CRT-P N/A 08/01/2020   Procedure: BIV PACEMAKER INSERTION CRT-P;  Surgeon: Constance Haw, MD;  Location: [facility] [facility];  Service: Cardiovascular;  Laterality: N/A;   CARDIAC EVENT MONITOR  12/2016   showed persistent Afib -monitor return in early because of persistent A. fib.  Several episodes of severe RVR with rates in the 170s were noted.    CARDIOVERSION  N/A 01/13/2018   Procedure: CARDIOVERSION;  Surgeon: Pixie Casino, MD;  Location: [facility]COPY;  Service: Cardiovascular;  Laterality: N/A; unsuccessful.-->  Post-cardioversion, the patient had left-sided facial numbness and weakness.  Ruled out for stroke.   CARDIOVERSION N/A 08/19/2019   Procedure: CARDIOVERSION;  Surgeon: Larey Dresser, MD;  Location: [facility] ENDOSCOPY;  Service: Cardiovascular;  Laterality: N/A;   INSERTION OF MESH N/A 07/22/2017   Procedure: INSERTION OF MESH;  Surgeon: [name], MD;  Location: [place];  Service: General;  Laterality: N/A;  BILATERAL TAP BLOCK   LEAD INSERTION N/A 09/11/2020   Procedure: LEAD INSERTION - LV LEAD;  Surgeon: Evans Lance, MD;  Location: [place] [facility];  Service: Cardiovascular;  Laterality: N/A;   NM MYOVIEW LTD  12/2017   Shows reduced EF, but no evidence of ischemia or infarction.  EF 40 and 45%.  INTERMEDIATE RISK due to reduced function.  EF notably better on echo (50 and 55%)   RIGHT/LEFT HEART CATH AND CORONARY ANGIOGRAPHY N/A 07/14/2019   Procedure: RIGHT/LEFT HEART CATH AND CORONARY ANGIOGRAPHY;  Surgeon: [name], MD;  Location: [place] [facility];; Angiographically normal coronary arteries. RHC  - PCWP & LVEDP 17 mmHg.  CO-CI 4.92-2.34   TEE WITHOUT CARDIOVERSION N/A 01/13/2018   Procedure: TRANSESOPHAGEAL ECHOCARDIOGRAM (TEE) with cardioversion - ;  Surgeon: Pixie Casino, MD;  Location: [facility] ENDOSCOPY;  Service: Cardiovascular;  Laterality: N/A;  Mild concentric LVH.  EF 55 to 60%.  No R  WMA.  Mild ascending aortic dilation of 4.2 cm.  Dilated left atrium.  No thrombus.   TONSILLECTOMY     TRANSTHORACIC ECHOCARDIOGRAM  10/11/2019   EF up to 35-40%.-Moderate-severely decreased function.  "GR 1 DD ".  Normal atrial sizes.  Moderate ascending aorta-44 mL.  Relatively normal valves.   TRANSTHORACIC ECHOCARDIOGRAM  06/2019   a) 06/2019: Severely reduced  LV Fxn w/th diffuse  HK & and septal dyskinesis.  EF <20%.  Reduced RV fxn w/ mildly elevated RVP--37 mmHg.  Mod R & Mild L Atrial dilation.  Asc Ao 42 mm; b) 10/2021:  EF 40%.  GR 1 DD.  Mild LVH.  Unable to assess PAP.  AoV sclerosis w/o stenosis.  Nl RAP.  Mild Asc Ao dilation.   VENTRAL HERNIA REPAIR N/A 07/22/2017   Procedure: OPEN VENTRAL HERNIA REPAIR WITH MESH ERAS PATHWAY;  Surgeon: [name], MD;  Location: [facility];  Service: General;  Laterality: N/A;  BILATERAL TAP BLOCK    Immunization History  Administered Date(s) Administered   Influenza, High Dose Seasonal PF 11/24/2017, 07/15/2019, 07/20/2020   Influenza,inj,Quad PF,6+ Mos 08/04/2016   Influenza-Unspecified 11/24/2017   PFIZER Comirnaty(Gray Top)Covid-19 Tri-Sucrose Vaccine 02/09/2021   PFIZER(Purple Top)SARS-COV-2 Vaccination 06/27/2020   Pneumococcal Conjugate-13 07/27/2017    MEDICATIONS/ALLERGIES   Current Meds  Medication Sig   acetaminophen (TYLENOL) 500 MG tablet Take 500 mg by mouth every 6 (six) hours as needed for moderate pain.    albuterol (VENTOLIN HFA) 108 (90 Base) MCG/ACT inhaler Inhale 1 puff into the lungs every 4 (four) hours as needed for wheezing or shortness of breath.    Alirocumab (PRALUENT) 150 MG/ML SOAJ Inject 150 mg into the skin every 14 (fourteen) days.   amiodarone (PACERONE) 200 MG tablet TAKE 1 TABLET BY MOUTH EVERY DAY   benzonatate (TESSALON) 100 MG capsule Take 100 mg by mouth 3 (three) times daily as needed for cough.   carvedilol (COREG) 3.125 MG tablet TAKE 1 TABLET (3.125 MG  TOTAL) BY MOUTH 2 (TWO) TIMES DAILY.    Cholecalciferol (VITAMIN D) 50 MCG (2000 UT) tablet Take 2,000 Units by mouth daily.   digoxin (LANOXIN) 0.125 MG tablet TAKE 1 TABLET BY MOUTH DAILY   ENTRESTO 24-26 MG TAKE 1 TABLET BY MOUTH EVERY DAY IN THE MORNING   EPLERENONE PO Take by mouth.   furosemide (LASIX) 40 MG tablet TAKE 1 TABLET BY MOUTH 2 TIMES DAILY. MAY TAKE AN ADDITIONAL DOSE IF WT IS GREATER THAN 3 LBS DAILY.   hydrocortisone (ANUSOL-HC) 25 MG suppository PLACE 1 SUPPOSITORY (25 MG TOTAL) RECTALLY AT BEDTIME. *NOT COVERED*   KLOR-CON M20 20 MEQ tablet TAKE 1 TABLET BY MOUTH DAILY. MAY TAKE AN ADDITIONAL TABLET IF EXTRA LASIX IS TAKEN AS NEEDED   metolazone (ZAROXOLYN) 2.5 MG tablet TAKE 1 TABLET BY MOUTH EVERY MONDAY, WEDNESDAY, AND FRIDAY. 30 MINUTES PRIOR TO YOUR FUROSEMIDE DOSE   Multiple Vitamins-Minerals (ULTRA MENS PACK PO) Take 1 tablet by mouth daily.   mupirocin ointment (BACTROBAN) 2 % Apply 1 application topically daily.   nitroGLYCERIN (NITROSTAT) 0.4 MG SL tablet Place 1 tablet (0.4 mg total) under the tongue every 5 (five) minutes as needed for chest pain.   Omega-3 Fatty Acids (FISH OIL) 1200 MG CAPS Take 1,200 mg by mouth every other day.   omeprazole (PRILOSEC) 20 MG capsule TAKE 1 CAPSULE BY MOUTH EVERY DAY   rosuvastatin (CRESTOR) 5 MG tablet Take 1 tablet (5 mg total) by mouth at bedtime.   sacubitril-valsartan (ENTRESTO) 49-51 MG Take 1 tablet by mouth at bedtime. ,per Dr Ellyn Hack   triamcinolone cream (KENALOG) 0.1 % Apply 1 application topically as needed.   XARELTO 20 MG TABS tablet TAKE 1 TABLET BY MOUTH DAILY WITH SUPPER.   [DISCONTINUED] budesonide-formoterol (SYMBICORT) 160-4.5 MCG/ACT inhaler Inhale 2 puffs into the lungs in the morning and at bedtime.    Allergies  Allergen Reactions   Crestor [Rosuvastatin] Other (See Comments)    myalgias   Statins Other (See Comments)    Muscle pains, mouth sores   Zetia [Ezetimibe] Other (See Comments)    Broke out in sores/ muscle pain   Iodine  Swelling and Rash    This is with topical betadine only.  No issues, per patient, with CT contrast. 10/16/21 J. Lohr, RN   Latex Rash    Bandages not gloves   Lipitor [Atorvastatin] Other (See Comments)    Knee pain   Livalo [Pitavastatin] Other (See Comments)    Muscle pain   Spironolactone Other (See Comments)    Soreness   Vaseline [Petrolatum] Rash    Break out   Zocor [Simvastatin] Other (See Comments)    myalgias    SOCIAL HISTORY/FAMILY HISTORY   Reviewed in Epic:  Pertinent findings:  Social History   Tobacco Use   Smoking status: Never   Smokeless tobacco: Never  Vaping Use   Vaping Use: Never used  Substance Use Topics   Alcohol use: Yes    Comment: Rarely   Drug use: No   Social History   Social History Narrative   He is a married father of 64, grandfather of 1.     Lives with his wife.      Now started back in an exercise routine trying to exercise least 30 minutes a day, and doing outdoor activities including yard work/landscaping and planting.    OBJCTIVE -PE, EKG, labs   Wt Readings from Last 3 Encounters:  12/04/21 246 lb 9.6 oz (111.9 kg)  07/18/21 240 lb (108.9 kg)  05/21/21 240 lb 9.6 oz (109.1 kg)    Physical Exam: BP 110/78    Pulse 65    Ht 5' 9" (1.753 m)    Wt 246 lb 9.6 oz (111.9 kg)    SpO2 98%    BMI 36.42 kg/m  Physical Exam Vitals reviewed.  Constitutional:      General: He is not in acute distress.    Appearance: Normal appearance. He is not ill-appearing or toxic-appearing.     Comments: More prominently obese.  Notable weight gain.  Mostly truncal well-groomed.  HENT:     Head: Normocephalic and atraumatic.  Neck:     Vascular: JVD (About 8-10 cm water.) present. No carotid bruit.  Cardiovascular:     Rate and Rhythm: Normal rate and regular rhythm. No extrasystoles are present.    Chest Wall: PMI is displaced (Lateral, sustained).     Pulses: Decreased pulses (Decreased pedal pulses, but palpable.).     Heart sounds: S1  normal and S2 normal. Heart sounds are distant.    Gallop present. S4 (Difficult to fully assess this could be split S2 from pacemaker.) sounds present.  Pulmonary:     Effort: Pulmonary effort is normal. No respiratory distress.     Breath sounds: No wheezing, rhonchi or rales.  Musculoskeletal:        General: Swelling present. Normal range of motion.     Cervical back: Normal range of motion and neck supple.  Skin:    General: Skin is warm and dry.     Coloration: Skin is not jaundiced.  Neurological:     General: No focal deficit present.     Mental Status: He is alert and oriented to person, place, and time.     Gait: Gait normal.  Psychiatric:        Mood and Affect: Mood normal.        Behavior: Behavior normal.        Thought Content: Thought content normal.        Judgment: Judgment normal.     Adult ECG Report  Rate: 65;  Rhythm:  A-V pacing stable  ;   Narrative Interpretation: Normal  Recent Labs: Reviewed Lab Results  Component Value Date   CHOL 102 11/20/2021   HDL 45 11/20/2021   LDLCALC 34 11/20/2021   TRIG 130 11/20/2021   CHOLHDL 2.3 11/20/2021   Lab Results  Component Value Date   CREATININE 1.09 05/06/2021   BUN 20 05/06/2021   NA 142 05/06/2021   K 4.1 05/06/2021   CL 104 05/06/2021   CO2 23 05/06/2021   CBC Latest Ref Rng & Units 03/05/2021 01/31/2021 09/04/2020  WBC 4.0 - 10.5 K/uL 7.4 6.8 11.2(H)  Hemoglobin 13.0 - 17.0 g/dL 13.8 13.6 12.5(L)  Hematocrit 39.0 - 52.0 % 41.4 41.6 38.1  Platelets 150.0 - 400.0 K/uL 194.0 209 173    Lab Results  Component Value Date   HGBA1C 5.6 01/14/2018   Lab Results  Component Value Date   TSH 1.940 12/04/2021    ==================================================  COVID-19 Education: The signs and symptoms of COVID-19 were discussed with the patient and how to seek care for testing (follow up with PCP or arrange E-visit).    I spent a total of 26 minutes with the patient spent in direct patient  consultation.  Additional time spent with chart review  / charting (studies, outside notes, etc): 22 min Total Time: 48 min  Current medicines  are reviewed at length with the patient today.  (+/- concerns) --> insurance issue with Praluent.  For this reason, not adjusted in [facility] either.  This visit occurred during the SARS-CoV-2 public health emergency.  Safety protocols were in place, including screening questions prior to the visit, additional usage of staff PPE, and extensive cleaning of exam room while observing appropriate contact time as indicated for disinfecting solutions.  Notice: This dictation was prepared with Dragon dictation along with smart phrase technology. Any transcriptional errors that result from this process are unintentional and may not be corrected upon review.  Studies Ordered:   Orders Placed This Encounter  Procedures   C-reactive protein   TSH   Sedimentation rate   EKG 12-Lead    Patient Instructions / Medication Changes & Studies & Tests Ordered   Patient Instructions  Medication Instructions:  Until resolved about Praulent  take one dose in Feb and one dose in March    Start doubling your diuretic  once week -- 40 mg in the morning and 20 mg in the evening  *If you need a refill on your cardiac medications before your next appointment, please call your pharmacy*   Lab Work: Sed rate CRP TSH  If you have labs (blood work) drawn today and your tests are completely normal, you will receive your results only by: MyChart Message (if you have MyChart) OR A paper copy in the mail If you have any lab test that is abnormal or we need to change your treatment, we will call you to review the results.   Testing/Procedures: Not needed   Follow-Up: At [facility], you and your health needs are our priority.  As part of our continuing mission to provide you with exceptional heart care, we have created designated Provider Care Teams.  These Care  Teams include your primary Cardiologist (physician) and Advanced Practice Providers (APPs -  Physician Assistants and Nurse Practitioners) who all work together to provide you with the care you need, when you need it.     Your next appointment:   6 month(s)  The format for your next appointment:   In Person  Provider:   [name], MD       [name], M.D., M.S. Interventional Cardiologist   Pager # [phone] Phone # [phone] [address] [place], [place] [address]   Thank you for choosing Heartcare at [facility]!!

## 2021-12-04 NOTE — Patient Instructions (Addendum)
Medication Instructions:  Until resolved about Praulent  take one dose in Feb and one dose in March    Start doubling your diuretic  once week -- 40 mg in the morning and 20 mg in the evening  *If you need a refill on your cardiac medications before your next appointment, please call your pharmacy*   Lab Work: Sed rate CRP TSH  If you have labs (blood work) drawn today and your tests are completely normal, you will receive your results only by: [place] (if you have MyChart) OR A paper copy in the mail If you have any lab test that is abnormal or we need to change your treatment, we will call you to review the results.   Testing/Procedures: Not needed   Follow-Up: At [facility], you and your health needs are our priority.  As part of our continuing mission to provide you with exceptional heart care, we have created designated Provider Care Teams.  These Care Teams include your primary Cardiologist (physician) and Advanced Practice Providers (APPs -  Physician Assistants and Nurse Practitioners) who all work together to provide you with the care you need, when you need it.     Your next appointment:   6 month(s)  The format for your next appointment:   In Person  Provider:   [name], MD

## 2021-12-05 ENCOUNTER — Encounter: Payer: Self-pay | Admitting: Cardiology

## 2021-12-05 ENCOUNTER — Other Ambulatory Visit: Payer: Self-pay | Admitting: Pulmonary Disease

## 2021-12-05 LAB — SEDIMENTATION RATE: Sed Rate: 10 mm/hr (ref 0–30)

## 2021-12-05 LAB — TSH: TSH: 1.94 u[IU]/mL (ref 0.450–4.500)

## 2021-12-05 LAB — C-REACTIVE PROTEIN: CRP: 7 mg/L (ref 0–10)

## 2021-12-06 ENCOUNTER — Encounter: Payer: Self-pay | Admitting: Cardiology

## 2021-12-06 DIAGNOSIS — D6869 Other thrombophilia: Secondary | ICD-10-CM | POA: Insufficient documentation

## 2021-12-06 DIAGNOSIS — I4811 Longstanding persistent atrial fibrillation: Secondary | ICD-10-CM | POA: Insufficient documentation

## 2021-12-06 NOTE — Assessment & Plan Note (Signed)
Chronic HFrEF NYHA II-III symptoms.  Probably still class II based on the lack of hospitalization, however he still has "smothering sensations: / with orthopnea and PND.  Exertional dyspnea is also present.  He has generalized fatigue but he does continue to stay active.  I think a lot of his symptoms are exacerbated by the fact that he is now 40 pounds heavier than he was when we started all of this.  This weight gain is all old truncal obesity and not necessarily fluid because his edema is pretty well controlled.  Plan:  Continue current low-dose carvedilol 3.25 mg twice daily along with digoxin 0.125 mg daily.  Continue split dosing of Entresto 24-26 AM & 49-51 mg PM  On eplerenone 25 mg  Furosemide taking 40 mg twice daily -> also able to take additional dose PRN weight gain greater than 3 pounds.  Also uses metolazone 3 days a week prior to furosemide.  I have asked that he double up his furosemide dosing at least once a week to take 2 tabs in the morning   Stressed importance of weight loss. -> If we are able to get him covered with Praluent, we will try again to work on getting [employer] for [employer] (GLP1-agonists) for Wgt Loss. Really needs appetite suppression.

## 2021-12-06 NOTE — Assessment & Plan Note (Signed)
Due for follow-up LFTs, TFTs ESR and CRP.  Will need to also consider PFTs this year.  Order at next visit.

## 2021-12-06 NOTE — Assessment & Plan Note (Signed)
CRT-P with HIS bundle lead (unable to place LV lead). Rate/rhythm control with combination of amiodarone and digoxin. Also on carvedilol low-dose for rate control.  (3.25 mg twice daily.  On Xarelto for anticoagulation.  Stable with no bleeding.

## 2021-12-06 NOTE — Assessment & Plan Note (Signed)
On Xarelto for A. fib, but also has had potentially evidence of LV thrombus in the past.  Tolerating well.  No bleeding issues.

## 2021-12-06 NOTE — Assessment & Plan Note (Signed)
S/p ~CRT-P with RVOT (HIS BUNDLE) lead.  On Amiodarone for rhythm/A. fib suppression.

## 2021-12-06 NOTE — Assessment & Plan Note (Signed)
Labs been outstanding with the addition of Praluent, he is only able to tolerate regular doses of rosuvastatin.  Unfortunately we are now having to go through the process of planning the insurance company game because of changes.  We will have CVRR team reach out to him to ensure that it is covered versus switch to [place].  I think he may just need a prior authorization resubmitted.

## 2021-12-06 NOTE — Assessment & Plan Note (Signed)
Thought to be related to combination of LBBB and A. fib.  Unfortunately EF did not improve despite CRT-P.  Blood pressure.  Makes it difficult to titrate up doses of Entresto and carvedilol.  Currently taking low-dose carvedilol 3.25 mg twice daily as well as Entresto with split dosing 24-26 mg in the morning and 49-51 mg in the evening.  Also on digoxin On furosemide plus intermittent dosing and as needed metolazone .  Unfortunate, follow-up echocardiogram still shows EF 40%.

## 2021-12-06 NOTE — Assessment & Plan Note (Addendum)
Has been stable on echo.  We will reassess with CTA later this year after next visit.  Respect modification with blood pressure and lipid control.  Also need to monitor for potential development of diabetes given his obesity.

## 2021-12-09 NOTE — Progress Notes (Signed)
Looks like he switched to a Medicare plan since last summer, that's probably why the praluent issue.   I'll have [place] fix that.  As for the [facility], unfortunately Medicare won't pay for it.  And since he's not diabetic we can't get him on Ozempic.  If I come up with some way to get it covered I'll let you know.

## 2021-12-30 ENCOUNTER — Other Ambulatory Visit (HOSPITAL_COMMUNITY): Payer: Self-pay

## 2022-01-04 ENCOUNTER — Other Ambulatory Visit: Payer: Self-pay | Admitting: Cardiology

## 2022-01-21 DIAGNOSIS — Z85828 Personal history of other malignant neoplasm of skin: Secondary | ICD-10-CM | POA: Diagnosis not present

## 2022-01-21 DIAGNOSIS — R221 Localized swelling, mass and lump, neck: Secondary | ICD-10-CM | POA: Diagnosis not present

## 2022-01-22 ENCOUNTER — Other Ambulatory Visit: Payer: Self-pay | Admitting: Cardiology

## 2022-01-22 ENCOUNTER — Other Ambulatory Visit: Payer: Self-pay | Admitting: Internal Medicine

## 2022-01-28 ENCOUNTER — Encounter: Payer: Self-pay | Admitting: Cardiology

## 2022-01-28 DIAGNOSIS — L853 Xerosis cutis: Secondary | ICD-10-CM | POA: Diagnosis not present

## 2022-01-28 DIAGNOSIS — I8311 Varicose veins of right lower extremity with inflammation: Secondary | ICD-10-CM | POA: Diagnosis not present

## 2022-01-28 DIAGNOSIS — L720 Epidermal cyst: Secondary | ICD-10-CM | POA: Diagnosis not present

## 2022-01-28 DIAGNOSIS — I872 Venous insufficiency (chronic) (peripheral): Secondary | ICD-10-CM | POA: Diagnosis not present

## 2022-01-28 DIAGNOSIS — D225 Melanocytic nevi of trunk: Secondary | ICD-10-CM | POA: Diagnosis not present

## 2022-01-28 DIAGNOSIS — I8312 Varicose veins of left lower extremity with inflammation: Secondary | ICD-10-CM | POA: Diagnosis not present

## 2022-01-28 DIAGNOSIS — L570 Actinic keratosis: Secondary | ICD-10-CM | POA: Diagnosis not present

## 2022-01-28 DIAGNOSIS — Z85828 Personal history of other malignant neoplasm of skin: Secondary | ICD-10-CM | POA: Diagnosis not present

## 2022-01-28 DIAGNOSIS — D485 Neoplasm of uncertain behavior of skin: Secondary | ICD-10-CM | POA: Diagnosis not present

## 2022-01-28 NOTE — Telephone Encounter (Signed)
Changed pharmacy as requested.

## 2022-02-07 ENCOUNTER — Ambulatory Visit (INDEPENDENT_AMBULATORY_CARE_PROVIDER_SITE_OTHER): Payer: Medicare Other

## 2022-02-07 DIAGNOSIS — I495 Sick sinus syndrome: Secondary | ICD-10-CM

## 2022-02-07 LAB — CUP PACEART REMOTE DEVICE CHECK
Battery Remaining Longevity: 111 mo
Battery Voltage: 3.01 V
Brady Statistic AP VP Percent: 75.4 %
Brady Statistic AP VS Percent: 0.98 %
Brady Statistic AS VP Percent: 22.48 %
Brady Statistic AS VS Percent: 1.15 %
Brady Statistic RA Percent Paced: 76.79 %
Brady Statistic RV Percent Paced: 97.88 %
Date Time Interrogation Session: 20230331003725
Implantable Lead Implant Date: 20210922
Implantable Lead Implant Date: 20211102
Implantable Lead Implant Date: 20211102
Implantable Lead Location: 753858
Implantable Lead Location: 753859
Implantable Lead Location: 753860
Implantable Lead Model: 3830
Implantable Lead Model: 4196
Implantable Lead Model: 5076
Implantable Pulse Generator Implant Date: 20210922
Lead Channel Impedance Value: 380 Ohm
Lead Channel Impedance Value: 418 Ohm
Lead Channel Impedance Value: 475 Ohm
Lead Channel Impedance Value: 475 Ohm
Lead Channel Impedance Value: 532 Ohm
Lead Channel Impedance Value: 589 Ohm
Lead Channel Impedance Value: 646 Ohm
Lead Channel Impedance Value: 722 Ohm
Lead Channel Impedance Value: 893 Ohm
Lead Channel Pacing Threshold Amplitude: 0.5 V
Lead Channel Pacing Threshold Amplitude: 0.875 V
Lead Channel Pacing Threshold Pulse Width: 0.4 ms
Lead Channel Pacing Threshold Pulse Width: 0.4 ms
Lead Channel Sensing Intrinsic Amplitude: 3.375 mV
Lead Channel Sensing Intrinsic Amplitude: 3.375 mV
Lead Channel Sensing Intrinsic Amplitude: 9.875 mV
Lead Channel Sensing Intrinsic Amplitude: 9.875 mV
Lead Channel Setting Pacing Amplitude: 1.5 V
Lead Channel Setting Pacing Amplitude: 2 V
Lead Channel Setting Pacing Amplitude: 2 V
Lead Channel Setting Pacing Pulse Width: 0.4 ms
Lead Channel Setting Pacing Pulse Width: 0.4 ms
Lead Channel Setting Sensing Sensitivity: 0.9 mV

## 2022-02-19 NOTE — Progress Notes (Signed)
Remote pacemaker transmission.   

## 2022-03-06 ENCOUNTER — Other Ambulatory Visit: Payer: Self-pay | Admitting: Cardiology

## 2022-03-11 DIAGNOSIS — I872 Venous insufficiency (chronic) (peripheral): Secondary | ICD-10-CM | POA: Diagnosis not present

## 2022-03-11 DIAGNOSIS — I8312 Varicose veins of left lower extremity with inflammation: Secondary | ICD-10-CM | POA: Diagnosis not present

## 2022-03-11 DIAGNOSIS — Z85828 Personal history of other malignant neoplasm of skin: Secondary | ICD-10-CM | POA: Diagnosis not present

## 2022-03-11 DIAGNOSIS — D485 Neoplasm of uncertain behavior of skin: Secondary | ICD-10-CM | POA: Diagnosis not present

## 2022-03-11 DIAGNOSIS — D170 Benign lipomatous neoplasm of skin and subcutaneous tissue of head, face and neck: Secondary | ICD-10-CM | POA: Diagnosis not present

## 2022-03-11 DIAGNOSIS — I8311 Varicose veins of right lower extremity with inflammation: Secondary | ICD-10-CM | POA: Diagnosis not present

## 2022-03-22 ENCOUNTER — Encounter: Payer: Self-pay | Admitting: Cardiology

## 2022-03-24 ENCOUNTER — Other Ambulatory Visit: Payer: Self-pay

## 2022-03-24 MED ORDER — PRALUENT 150 MG/ML ~~LOC~~ SOAJ: 150.0000 mg | SUBCUTANEOUS | 3 refills | Status: DC

## 2022-04-24 ENCOUNTER — Other Ambulatory Visit: Payer: Self-pay | Admitting: Internal Medicine

## 2022-05-09 ENCOUNTER — Ambulatory Visit (INDEPENDENT_AMBULATORY_CARE_PROVIDER_SITE_OTHER): Payer: Medicare Other

## 2022-05-09 DIAGNOSIS — I495 Sick sinus syndrome: Secondary | ICD-10-CM | POA: Diagnosis not present

## 2022-05-09 LAB — CUP PACEART REMOTE DEVICE CHECK
Battery Remaining Longevity: 104 mo
Battery Voltage: 3.01 V
Brady Statistic AP VP Percent: 60.33 %
Brady Statistic AP VS Percent: 0.68 %
Brady Statistic AS VP Percent: 38.5 %
Brady Statistic AS VS Percent: 0.5 %
Brady Statistic RA Percent Paced: 59.58 %
Brady Statistic RV Percent Paced: 97.85 %
Date Time Interrogation Session: 20230629195202
Implantable Lead Implant Date: 20210922
Implantable Lead Implant Date: 20211102
Implantable Lead Implant Date: 20211102
Implantable Lead Location: 753858
Implantable Lead Location: 753859
Implantable Lead Location: 753860
Implantable Lead Model: 3830
Implantable Lead Model: 4196
Implantable Lead Model: 5076
Implantable Pulse Generator Implant Date: 20210922
Lead Channel Impedance Value: 361 Ohm
Lead Channel Impedance Value: 380 Ohm
Lead Channel Impedance Value: 399 Ohm
Lead Channel Impedance Value: 437 Ohm
Lead Channel Impedance Value: 494 Ohm
Lead Channel Impedance Value: 570 Ohm
Lead Channel Impedance Value: 589 Ohm
Lead Channel Impedance Value: 703 Ohm
Lead Channel Impedance Value: 874 Ohm
Lead Channel Pacing Threshold Amplitude: 0.5 V
Lead Channel Pacing Threshold Amplitude: 0.875 V
Lead Channel Pacing Threshold Pulse Width: 0.4 ms
Lead Channel Pacing Threshold Pulse Width: 0.4 ms
Lead Channel Sensing Intrinsic Amplitude: 3.875 mV
Lead Channel Sensing Intrinsic Amplitude: 3.875 mV
Lead Channel Sensing Intrinsic Amplitude: 9.625 mV
Lead Channel Sensing Intrinsic Amplitude: 9.625 mV
Lead Channel Setting Pacing Amplitude: 1.5 V
Lead Channel Setting Pacing Amplitude: 2 V
Lead Channel Setting Pacing Amplitude: 2 V
Lead Channel Setting Pacing Pulse Width: 0.4 ms
Lead Channel Setting Pacing Pulse Width: 0.4 ms
Lead Channel Setting Sensing Sensitivity: 0.9 mV

## 2022-05-22 NOTE — Progress Notes (Signed)
Remote pacemaker transmission.   

## 2022-05-29 ENCOUNTER — Encounter: Payer: Self-pay | Admitting: Internal Medicine

## 2022-05-30 ENCOUNTER — Other Ambulatory Visit: Payer: Self-pay

## 2022-05-30 DIAGNOSIS — I5042 Chronic combined systolic (congestive) and diastolic (congestive) heart failure: Secondary | ICD-10-CM

## 2022-05-30 DIAGNOSIS — I5043 Acute on chronic combined systolic (congestive) and diastolic (congestive) heart failure: Secondary | ICD-10-CM

## 2022-05-30 MED ORDER — AMIODARONE HCL 200 MG PO TABS: 200.0000 mg | ORAL_TABLET | Freq: Every day | ORAL | 0 refills | Status: DC

## 2022-05-30 MED ORDER — CARVEDILOL 3.125 MG PO TABS: 3.1250 mg | ORAL_TABLET | Freq: Two times a day (BID) | ORAL | 1 refills | Status: DC

## 2022-05-30 MED ORDER — ROSUVASTATIN CALCIUM 5 MG PO TABS: 5.0000 mg | ORAL_TABLET | Freq: Every day | ORAL | 0 refills | Status: DC

## 2022-05-30 MED ORDER — FUROSEMIDE 40 MG PO TABS: 40.0000 mg | ORAL_TABLET | Freq: Every day | ORAL | 0 refills | Status: DC

## 2022-05-30 MED ORDER — POTASSIUM CHLORIDE CRYS ER 20 MEQ PO TBCR: 20.0000 meq | EXTENDED_RELEASE_TABLET | Freq: Every day | ORAL | 1 refills | Status: DC

## 2022-05-30 MED ORDER — DIGOXIN 125 MCG PO TABS: 125.0000 ug | ORAL_TABLET | Freq: Every day | ORAL | 1 refills | Status: DC

## 2022-06-09 ENCOUNTER — Ambulatory Visit: Payer: Medicare Other | Admitting: Cardiology

## 2022-06-09 ENCOUNTER — Encounter: Payer: Self-pay | Admitting: Cardiology

## 2022-06-09 VITALS — BP 118/88 | HR 66 | Ht 69.0 in | Wt 245.0 lb

## 2022-06-09 DIAGNOSIS — N62 Hypertrophy of breast: Secondary | ICD-10-CM | POA: Diagnosis not present

## 2022-06-09 DIAGNOSIS — I4811 Longstanding persistent atrial fibrillation: Secondary | ICD-10-CM | POA: Diagnosis not present

## 2022-06-09 DIAGNOSIS — T50905A Adverse effect of unspecified drugs, medicaments and biological substances, initial encounter: Secondary | ICD-10-CM

## 2022-06-09 DIAGNOSIS — Z79899 Other long term (current) drug therapy: Secondary | ICD-10-CM

## 2022-06-09 DIAGNOSIS — E669 Obesity, unspecified: Secondary | ICD-10-CM

## 2022-06-09 DIAGNOSIS — I4819 Other persistent atrial fibrillation: Secondary | ICD-10-CM | POA: Diagnosis not present

## 2022-06-09 DIAGNOSIS — I7121 Aneurysm of the ascending aorta, without rupture: Secondary | ICD-10-CM | POA: Diagnosis not present

## 2022-06-09 DIAGNOSIS — I420 Dilated cardiomyopathy: Secondary | ICD-10-CM | POA: Diagnosis not present

## 2022-06-09 DIAGNOSIS — D6869 Other thrombophilia: Secondary | ICD-10-CM | POA: Diagnosis not present

## 2022-06-09 DIAGNOSIS — I872 Venous insufficiency (chronic) (peripheral): Secondary | ICD-10-CM

## 2022-06-09 DIAGNOSIS — I495 Sick sinus syndrome: Secondary | ICD-10-CM

## 2022-06-09 DIAGNOSIS — E785 Hyperlipidemia, unspecified: Secondary | ICD-10-CM | POA: Diagnosis not present

## 2022-06-09 DIAGNOSIS — T50905D Adverse effect of unspecified drugs, medicaments and biological substances, subsequent encounter: Secondary | ICD-10-CM

## 2022-06-09 DIAGNOSIS — I5042 Chronic combined systolic (congestive) and diastolic (congestive) heart failure: Secondary | ICD-10-CM

## 2022-06-09 LAB — LIPID PANEL
Chol/HDL Ratio: 3.1 ratio (ref 0.0–5.0)
Cholesterol, Total: 141 mg/dL (ref 100–199)
HDL: 45 mg/dL (ref >39)
LDL Chol Calc (NIH): 68 mg/dL (ref 0–99)
Triglycerides: 167 mg/dL — ABNORMAL HIGH (ref 0–149)
VLDL Cholesterol Cal: 28 mg/dL (ref 5–40)

## 2022-06-09 LAB — COMPREHENSIVE METABOLIC PANEL
ALT: 29 IU/L (ref 0–44)
AST: 24 IU/L (ref 0–40)
Albumin/Globulin Ratio: 1.7 (ref 1.2–2.2)
Albumin: 4.9 g/dL (ref 3.9–4.9)
Alkaline Phosphatase: 66 IU/L (ref 44–121)
BUN/Creatinine Ratio: 11 (ref 10–24)
BUN: 14 mg/dL (ref 8–27)
Bilirubin Total: 0.3 mg/dL (ref 0.0–1.2)
CO2: 29 mmol/L (ref 20–29)
Calcium: 9.9 mg/dL (ref 8.6–10.2)
Chloride: 97 mmol/L (ref 96–106)
Creatinine, Ser: 1.24 mg/dL (ref 0.76–1.27)
Globulin, Total: 2.9 g/dL (ref 1.5–4.5)
Glucose: 110 mg/dL — ABNORMAL HIGH (ref 70–99)
Potassium: 4.5 mmol/L (ref 3.5–5.2)
Sodium: 140 mmol/L (ref 134–144)
Total Protein: 7.8 g/dL (ref 6.0–8.5)
eGFR: 63 mL/min/{1.73_m2} (ref >59)

## 2022-06-09 MED ORDER — NITROGLYCERIN 0.4 MG SL SUBL: 0.4000 mg | SUBLINGUAL_TABLET | SUBLINGUAL | 3 refills | Status: AC | PRN

## 2022-06-09 MED ORDER — RIVAROXABAN 20 MG PO TABS: 20.0000 mg | ORAL_TABLET | Freq: Every day | ORAL | 2 refills | Status: DC

## 2022-06-09 MED ORDER — FUROSEMIDE 80 MG PO TABS: 80.0000 mg | ORAL_TABLET | Freq: Two times a day (BID) | ORAL | 3 refills | Status: DC

## 2022-06-09 MED ORDER — METOLAZONE 2.5 MG PO TABS: ORAL_TABLET | ORAL | 3 refills | Status: DC

## 2022-06-09 MED ORDER — POTASSIUM CHLORIDE CRYS ER 20 MEQ PO TBCR: 20.0000 meq | EXTENDED_RELEASE_TABLET | Freq: Every day | ORAL | 3 refills | Status: DC

## 2022-06-09 MED ORDER — ENTRESTO 49-51 MG PO TABS: 1.0000 | ORAL_TABLET | Freq: Every day | ORAL | 3 refills | Status: DC

## 2022-06-09 MED ORDER — ROSUVASTATIN CALCIUM 5 MG PO TABS: 5.0000 mg | ORAL_TABLET | Freq: Every day | ORAL | 3 refills | Status: DC

## 2022-06-09 NOTE — Assessment & Plan Note (Signed)
He definitely has right greater than left lower extremity edema which is probably related to venous stasis.  He is not wearing his support stockings today, I did recommend he wear them.  Plan: Elevate feet 20 to 30 minutes at a time total of 4 4 times a day which does include when he goes for his nap and with going to bed.  We will get a baseline chemistry panel

## 2022-06-09 NOTE — Assessment & Plan Note (Signed)
He does initially notice that he is in A-fib, he just simply will get short of breath and had worsening smothering.  I think he probably has had breakthrough spells as noted on his device monitor.  He is on amiodarone with hopes to try to maintain as much sinus rhythm as possible. He is on digoxin for additional rate control along with carvedilol.    He is on Xarelto.  No bleeding issues.

## 2022-06-09 NOTE — Progress Notes (Signed)
Primary Care Provider: Ramiro Harvest, PA-C Cardiologist: [name], MD Electrophysiologist: Will Meredith Leeds, MD; Dr. Lovena Le  Clinic Note: Chief Complaint  Patient presents with  . Follow-up    6 months.  Still has "smothering".  Taking a total of 80 mg  Lasix a day.  . Congestive Heart Failure  . Atrial Fibrillation    Has not noticed recently.   ===================================  ASSESSMENT/PLAN   Problem List Items Addressed This Visit       Cardiology Problems   Chronic combined systolic and diastolic heart failure (HCC) - Primary (Chronic)    NYHA Class IIB-IIIA CHF symptoms still with some smothering sensation and inconsistent edema.  No hospitalizations since that time.  Major issues smothering sensations and edema as well as fatigue. He says his weight is up and down, but overall is gradually slowly increasing over the last couple years height-still having issues with controlling his appetite.  At least his weight is stable from last visit.  Unfortunate, he seems taken more Lasix than he had been in the past.  Plan:  He is on a stable dose of carvedilol at 3.25 mg daily.  Blood pressure have not tolerated going further.   On digoxin. => We will need to check labs next visit. Similarly, he is on an interesting dosage of Entresto taking the lower dose 24/26 mg in the morning but taking the next dose up 49 and 51 mg in the evening.  The intention was to have better control at night but less aggressive treatment during the day to allow for more stability and gait and energy level. I am not exactly sure why this is so difficult for insurance companies and pharmacies understand. Increase standing dose Lasix to 80 mg twice daily with additional 40 to 80 mg daily as needed for worsening edema or Weight gain more than 3 pounds. Continue metolazone 3 days a week and additional as needed. Continue eplerenone (intolerant of spironolactone.       Relevant Medications    furosemide (LASIX) 80 MG tablet   metolazone (ZAROXOLYN) 2.5 MG tablet   nitroGLYCERIN (NITROSTAT) 0.4 MG SL tablet   rosuvastatin (CRESTOR) 5 MG tablet   sacubitril-valsartan (ENTRESTO) 49-51 MG   rivaroxaban (XARELTO) 20 MG TABS tablet   Other Relevant Orders   Lipid panel (Completed)   Comprehensive metabolic panel (Completed)   Nonischemic dilated cardiomyopathy (HCC) (Chronic)    Probably related to combination A-fib and LBBB.  Unfortunately, no real improvement in EF with CRT-P.  Blood pressure has been somewhat limiting as far as how aggressive we continue with management: With him being on amiodarone for rate/rhythm control, we have not been on the push carvedilol up beyond 3.125 mg twice daily. He did not tolerate twice daily dosing of the 49 and 51 mg Entresto citing dizziness and fatigue during the day.  We therefore cut his dose down in the morning to the 24-26 mg tablet and he actually is tolerating it well.  (This is the cause of much consternation amongst the pharmacists, pharmacies and insurance company.      Relevant Medications   furosemide (LASIX) 80 MG tablet   metolazone (ZAROXOLYN) 2.5 MG tablet   nitroGLYCERIN (NITROSTAT) 0.4 MG SL tablet   rosuvastatin (CRESTOR) 5 MG tablet   sacubitril-valsartan (ENTRESTO) 49-51 MG   rivaroxaban (XARELTO) 20 MG TABS tablet   Other Relevant Orders   Lipid panel (Completed)   Comprehensive metabolic panel (Completed)   CT ANGIO CHEST AORTA W/CM &  OR WO/CM   Persistent atrial fibrillation ([place]): CHA2DS2-VASc Score 4 (CHF, HTN, Age 35, Aortic Plaque) (Chronic)    He does initially notice that he is in A-fib, he just simply will get short of breath and had worsening smothering.  I think he probably has had breakthrough spells as noted on his device monitor.  He is on amiodarone with hopes to try to maintain as much sinus rhythm as possible. He is on digoxin for additional rate control along with carvedilol.    He is on Xarelto.   No bleeding issues.      Relevant Medications   furosemide (LASIX) 80 MG tablet   metolazone (ZAROXOLYN) 2.5 MG tablet   nitroGLYCERIN (NITROSTAT) 0.4 MG SL tablet   rosuvastatin (CRESTOR) 5 MG tablet   sacubitril-valsartan (ENTRESTO) 49-51 MG   rivaroxaban (XARELTO) 20 MG TABS tablet   Other Relevant Orders   Lipid panel (Completed)   Comprehensive metabolic panel (Completed)   Edema of right lower extremity due to peripheral venous insufficiency (Chronic)    He definitely has right greater than left lower extremity edema which is probably related to venous stasis.  He is not wearing his support stockings today, I did recommend he wear them.  Plan: Elevate feet 20 to 30 minutes at a time total of 4 4 times a day which does include when he goes for his nap and with going to bed.  We will get a baseline chemistry panel      Relevant Medications   furosemide (LASIX) 80 MG tablet   metolazone (ZAROXOLYN) 2.5 MG tablet   nitroGLYCERIN (NITROSTAT) 0.4 MG SL tablet   rosuvastatin (CRESTOR) 5 MG tablet   sacubitril-valsartan (ENTRESTO) 49-51 MG   rivaroxaban (XARELTO) 20 MG TABS tablet   Other Relevant Orders   Lipid panel (Completed)   Comprehensive metabolic panel (Completed)   Hypercoagulable state due to longstanding persistent atrial fibrillation: CHA2DS2-VASc score 4 (Chronic)    On Xarelto. - Okay to hold Xarelto 2 to 3 days preop for surgeries or procedures.  This patients CHA2DS2-VASc Score and unadjusted Ischemic Stroke Rate (% per year) is equal to 4.8 % stroke rate/year from a score of 4  Above score calculated as 1 point each if present [CHF, HTN, DM, Vascular=MI/PAD/Aortic Plaque, Age if 65-74, or Male] Above score calculated as 2 points each if present [Age > 75, or Stroke/TIA/TE]        Relevant Medications   furosemide (LASIX) 80 MG tablet   metolazone (ZAROXOLYN) 2.5 MG tablet   nitroGLYCERIN (NITROSTAT) 0.4 MG SL tablet   rosuvastatin (CRESTOR) 5 MG tablet    sacubitril-valsartan (ENTRESTO) 49-51 MG   rivaroxaban (XARELTO) 20 MG TABS tablet   Other Relevant Orders   Lipid panel (Completed)   Comprehensive metabolic panel (Completed)   Hyperlipidemia LDL goal <70 (Chronic)    Doing well on Praluent and low-dose Crestor.  Plan: Check labs-lipid panel today.      Relevant Medications   furosemide (LASIX) 80 MG tablet   metolazone (ZAROXOLYN) 2.5 MG tablet   nitroGLYCERIN (NITROSTAT) 0.4 MG SL tablet   rosuvastatin (CRESTOR) 5 MG tablet   sacubitril-valsartan (ENTRESTO) 49-51 MG   rivaroxaban (XARELTO) 20 MG TABS tablet   Other Relevant Orders   Lipid panel (Completed)   Comprehensive metabolic panel (Completed)   Thoracic aortic aneurysm (HCC) (Chronic)    Should be due for follow-up CT scan soon.      Relevant Medications   furosemide (LASIX) 80 MG tablet  metolazone (ZAROXOLYN) 2.5 MG tablet   nitroGLYCERIN (NITROSTAT) 0.4 MG SL tablet   rosuvastatin (CRESTOR) 5 MG tablet   sacubitril-valsartan (ENTRESTO) 49-51 MG   rivaroxaban (XARELTO) 20 MG TABS tablet   Other Relevant Orders   Lipid panel (Completed)   Comprehensive metabolic panel (Completed)   CT ANGIO CHEST AORTA W/CM & OR WO/CM   Tachycardia-bradycardia syndrome (HCC) (Chronic)    Status post CRT-P      Relevant Medications   furosemide (LASIX) 80 MG tablet   metolazone (ZAROXOLYN) 2.5 MG tablet   nitroGLYCERIN (NITROSTAT) 0.4 MG SL tablet   rosuvastatin (CRESTOR) 5 MG tablet   sacubitril-valsartan (ENTRESTO) 49-51 MG   rivaroxaban (XARELTO) 20 MG TABS tablet   Other Relevant Orders   Lipid panel (Completed)   Comprehensive metabolic panel (Completed)     Other   Drug-induced gynecomastia    Notably improved having converted from spironolactone to eplerenone.      Long term current use of amiodarone (Chronic)    Recently evaluated ESR and CRP.[name]  Normal. LFTs normal. Needs TFTs checked next time.  We will consider PFTs next visit.      Relevant  Orders   Lipid panel (Completed)   Comprehensive metabolic panel (Completed)   Obesity, Class II, BMI 35-39.9 (Chronic)    Still having issues or weight gain.  Daneil Dan has been stable now for the last 6 months but not any significant gain.  But does not able to lose.  Had considered Wegovy, was not able to tolerate both from a symptom standpoint and cost.  Combination of the cholesterol Wegovy along with Xarelto, Entresto, Eplerenone and Praluent was untenable.       ===================================  HPI:    Randall Best is a 71 y.o. male with a PMH notable for Paroxysmal-Persistent A-fib, chronic LBBB with resultant DCM (NICM) & chronic HFrEF, s/p CRT-P placement For Tachybradycardia syndrome who presents today for 38-month follow-up at the request of Ramiro Harvest, PA-C.  Initial diagnosis with A. fib RVR January 2019 -> TEE DCCV complicated by mild TIA Underlying LBBB April 2020: Developed and DCM w/ Chronic HFrEF-symptom is smothering at night, exertional dyspnea, and edema 07/05/2019 -> noted to have persistent A. fib with LBBB.  EF<20% by Echo => DCCV, added carvedilol and Lasix 07/14/2019:R&LHC -> normal Cors _> initiated amiodarone to maintain NSR, added spironolactone-converted to eplerenone because of gynecomastia (at that time his dry weight was 240 205 pounds) => facilitated DCCV 08/02/2019 Echo 11/09/2019: EF 35 to 40%, therefore no ICD ->  Attempted CRT-P in June 2021, unable to place LV lead -> RVOT lead inserted 09/11/2020 by Dr. Maryruth Hancock was last seen on Jan 25th, 2023 noted to be putting on quite a bit of weight.  Having a hard time curbing his appetite.  Had gained significant amount of weight on the scales.  Also noted to be a little more sedentary, but trying to do the jobs around the house during the day.  Noted a little improvement in his fatigability and dyspnea.  Noted that his nighttime smothering and also improved some.  Less frequent and  less prominent.  Still sleeping at roughly 45  versus 60 .  Noted exertional dyspnea especially with any kind of incline or exertion/carrying anything.  No overt symptoms irregular beats palpitations.[name]  He indicated that he received a letter from his insurance company stating that the Medicare would not cover Praluent any further.  The cost is $400  a month.  Apparently they need prior authorization. Clarified PSCK9-I issue 1 d/week - take doble dose of diuretic  ESR/CRP checked = normal   Recent Hospitalizations: none  Reviewed  CV studies:    The following studies were reviewed today: (if available, images/films reviewed: From Epic Chart or Care Everywhere) None:  Interval History:   VALDIS BEVILL  Still exercising at the gym every other day for about for about an hour.  He also tries stay active about 5 hours a day. He has some good days and otherwise has some bad days.  Does not sleep well at night, is the right leg bothers him with aching and pain.  The right leg is more swollen than the left.  He uses a antibiotic ointment when the skin cracks.  He does try to elevate his legs but has been wearing his support stockings at nighttime because it is so hot.  He still has times when he has the "smothering sensation "(walking up stairs.  Usually is not at rest.  He still sleeps on multiple pillows, but now with 5 as opposed to 7 pillows.  He also notes occasionally having pretty significant dyspnea if he overexerts.  He has been having issues with left arm discomfort and feeling a fluttering sensation lasting few seconds. He is taking 2 of the 40 mg Lasix in the morning and 1 in the evening most days, and takes the metolazone every other day. He actually joined a gym a couple months ago and tries to go at least 3 days a week almost every other day, and is otherwise active at least 5 hours a day when he is not working out at [employer].  He is able to do his exercises, but still gets  winded. He comments a lot about the coverage of the Praluent and eplerenone.  He also says that they continue to give him a hard time about the split dose of Entresto-they do not understand he is taking the low-dose in the morning of the high dose in the evening as opposed to the same dose twice daily.  They simply cannot understand that.  CV Review of Symptoms (Summary) Cardiovascular ROS: positive for - dyspnea on exertion, edema, orthopnea, paroxysmal nocturnal dyspnea, and shortness of breath negative for - chest pain or lightheadedness, dizziness or wooziness, syncope/near syncope TIA/amaurosis fugax.  Claudication.  Melena, hematochezia or hematuria.  Epistaxis.  REVIEWED OF SYSTEMS   Review of Systems  Constitutional:  Positive for malaise/fatigue (Notably reduced exercise tolerance). Negative for weight loss.  HENT:  Negative for nosebleeds.        Eyes are clearing up.  Respiratory:  Positive for shortness of breath. Negative for hemoptysis and wheezing.   Cardiovascular:  Chest pain: Not really more just some discomfort off-and-on.       Per HPI  Gastrointestinal:  Negative for blood in stool and melena.  Musculoskeletal:  Positive for joint pain.  Neurological:  Positive for dizziness (Positional, standing up quickly.) and weakness (Legs get weak). Negative for focal weakness.  Psychiatric/Behavioral:  Negative for depression and memory loss. The patient is nervous/anxious.    I have reviewed and (if needed) personally updated the patient's problem list, medications, allergies, past medical and surgical history, social and family history.   PAST MEDICAL HISTORY   Past Medical History:  Diagnosis Date  . Asthma    as child  . Atrial fibrillation, rapid ([place]) 12/08/2017   Diagnosed during colonoscopy -presumably new onset  .  BBB (bundle branch block)   . GERD (gastroesophageal reflux disease)    use meds prn  . Glaucoma   . Headache    remote h/o migraines, none in years   . Hyperlipidemia   . NICM (nonischemic cardiomyopathy) ([place]) 04/2019   Echo 05/2019 - EF reduced to <20%.  Has permanent Afib & LBBB (normal Coronaries on Cath). -->  After cardioversion and titration medications, EF up to 35-40% with  global hypokinesis..  . Thoracic aortic aneurysm ([place]) 10/20/2019   Ascending thoracic aortic estimated 44 mm on echo  . TIA (transient ischemic attack) 01/13/2018  . Ventral hernia     PAST SURGICAL HISTORY   Past Surgical History:  Procedure Laterality Date  . BIV PACEMAKER INSERTION CRT-P N/A 08/01/2020   Procedure: BIV PACEMAKER INSERTION CRT-P;  Surgeon: Constance Haw, MD;  Location: [facility];  Service: Cardiovascular;  Laterality: N/A;  . CARDIAC EVENT MONITOR  12/2016   showed persistent Afib -monitor return in early because of persistent A. fib.  Several episodes of severe RVR with rates in the 170s were noted.   [name] CARDIOVERSION N/A 01/13/2018   Procedure: CARDIOVERSION;  Surgeon: Pixie Casino, MD;  Location: [facility] ENDOSCOPY;  Service: Cardiovascular;  Laterality: N/A; unsuccessful.-->  Post-cardioversion, the patient had left-sided facial numbness and weakness.  Ruled out for stroke.  [name] CARDIOVERSION N/A 08/19/2019   Procedure: CARDIOVERSION;  Surgeon: Larey Dresser, MD;  Location: [facility] ENDOSCOPY;  Service: Cardiovascular;  Laterality: N/A;  . INSERTION OF MESH N/A 07/22/2017   Procedure: INSERTION OF MESH;  Surgeon: [name], MD;  Location: [facility];  Service: General;  Laterality: N/A;  BILATERAL TAP BLOCK  . LEAD INSERTION N/A 09/11/2020   Procedure: LEAD INSERTION - LV LEAD;  Surgeon: Evans Lance, MD;  Location: [place] [facility];  Service: Cardiovascular;  Laterality: N/A;  . NM MYOVIEW LTD  12/2017   Shows reduced EF, but no evidence of ischemia or infarction.  EF 40 and 45%.  INTERMEDIATE RISK due to reduced function.  EF notably better on echo (50 and 55%)  . RIGHT/LEFT HEART CATH AND CORONARY ANGIOGRAPHY N/A  07/14/2019   Procedure: RIGHT/LEFT HEART CATH AND CORONARY ANGIOGRAPHY;  Surgeon: [name], MD;  Location: [place] [facility];; Angiographically normal coronary arteries. RHC  - PCWP & LVEDP 17 mmHg.  CO-CI 4.92-2.34  . TEE WITHOUT CARDIOVERSION N/A 01/13/2018   Procedure: TRANSESOPHAGEAL ECHOCARDIOGRAM (TEE) with cardioversion - ;  Surgeon: Pixie Casino, MD;  Location: [facility] ENDOSCOPY;  Service: Cardiovascular;  Laterality: N/A;  Mild concentric LVH.  EF 55 to 60%.  No R WMA.  Mild ascending aortic dilation of 4.2 cm.  Dilated left atrium.  No thrombus.  . TONSILLECTOMY    . TRANSTHORACIC ECHOCARDIOGRAM  10/11/2019   EF up to 35-40%.-Moderate-severely decreased function.  "GR 1 DD ".  Normal atrial sizes.  Moderate ascending aorta-44 mL.  Relatively normal valves.  . TRANSTHORACIC ECHOCARDIOGRAM  06/2019   a) 06/2019: Severely reduced  LV Fxn w/th diffuse  HK & and septal dyskinesis.  EF <20%.  Reduced RV fxn w/ mildly elevated RVP--37 mmHg.  Mod R & Mild L Atrial dilation.  Asc Ao 42 mm; b) 10/2021:  EF 40%.  GR 1 DD.  Mild LVH.  Unable to assess PAP.  AoV sclerosis w/o stenosis.  Nl RAP.  Mild Asc Ao dilation.  . VENTRAL HERNIA REPAIR N/A 07/22/2017   Procedure: OPEN VENTRAL HERNIA REPAIR WITH MESH  ERAS PATHWAY;  Surgeon: [name], MD;  Location: [facility];  Service: General;  Laterality: N/A;  BILATERAL TAP BLOCK    Immunization History  Administered Date(s) Administered  . Influenza, High Dose Seasonal PF 11/24/2017, 07/15/2019, 07/20/2020  . Influenza,inj,Quad PF,6+ Mos 08/04/2016  . Influenza-Unspecified 11/24/2017  . PFIZER Comirnaty(Gray Top)Covid-19 Tri-Sucrose Vaccine 02/09/2021  . PFIZER(Purple Top)SARS-COV-2 Vaccination 06/27/2020  . Pneumococcal Conjugate-13 07/27/2017    MEDICATIONS/ALLERGIES   Current Meds  Medication Sig  . acetaminophen (TYLENOL) 500 MG tablet Take 500 mg by mouth every 6 (six) hours as needed for moderate pain.   [name] albuterol (VENTOLIN HFA)  108 (90 Base) MCG/ACT inhaler Inhale 1 puff into the lungs every 4 (four) hours as needed for wheezing or shortness of breath.   . Alirocumab (PRALUENT) 150 MG/ML SOAJ Inject 150 mg into the skin every 14 (fourteen) days.  [name] amiodarone (PACERONE) 200 MG tablet Take 1 tablet (200 mg total) by mouth daily.  . benzonatate (TESSALON) 100 MG capsule Take 100 mg by mouth 3 (three) times daily as needed for cough.  . carvedilol (COREG) 3.125 MG tablet Take 1 tablet (3.125 mg total) by mouth 2 (two) times daily. Keep ov  . Cholecalciferol (VITAMIN D) 50 MCG (2000 UT) tablet Take 2,000 Units by mouth daily.  . digoxin (LANOXIN) 0.125 MG tablet Take 1 tablet (125 mcg total) by mouth daily. Keep OV  . ENTRESTO 24-26 MG TAKE 1 TABLET BY MOUTH EVERY DAY IN THE MORNING  . gabapentin (NEURONTIN) 300 MG capsule 1 capsule  . hydrocortisone (ANUSOL-HC) 25 MG suppository PLACE 1 SUPPOSITORY (25 MG TOTAL) RECTALLY AT BEDTIME. *NOT COVERED*  . Multiple Vitamins-Minerals (ULTRA MENS PACK PO) Take 1 tablet by mouth daily.  . mupirocin ointment (BACTROBAN) 2 % Apply 1 application topically daily.  . Omega-3 Fatty Acids (FISH OIL) 1200 MG CAPS Take 1,200 mg by mouth every other day.  [name] omeprazole (PRILOSEC) 20 MG capsule Take 1 capsule (20 mg total) by mouth daily. Office visit for further refills  . SYMBICORT 160-4.5 MCG/ACT inhaler INHALE 2 PUFFS INTO THE LUNGS IN THE MORNING AND AT BEDTIME.  [name] triamcinolone cream (KENALOG) 0.1 % Apply 1 application topically as needed.  . $'[]'H$  furosemide (LASIX) 40 MG tablet Take 1 tablet (40 mg total) by mouth daily.  . $'[]'N$  metolazone (ZAROXOLYN) 2.5 MG tablet TAKE 1 TABLET BY MOUTH EVERY MONDAY, WEDNESDAY, AND FRIDAY. 30 MINUTES PRIOR TO YOUR FUROSEMIDE DOSE  . $'[]'I$  nitroGLYCERIN (NITROSTAT) 0.4 MG SL tablet Place 1 tablet (0.4 mg total) under the tongue every 5 (five) minutes as needed for chest pain.  . $'[]'e$  potassium chloride SA (KLOR-CON M20) 20 MEQ tablet Take 1 tablet (20 mEq total)  by mouth daily. KEEP OV  . $'[]'A$  rosuvastatin (CRESTOR) 5 MG tablet Take 1 tablet (5 mg total) by mouth daily. Keep OV  . $'[]'v$  sacubitril-valsartan (ENTRESTO) 49-51 MG Take 1 tablet by mouth at bedtime. ,per Dr Ellyn Hack  . $'[]'n$  XARELTO 20 MG TABS tablet TAKE 1 TABLET BY MOUTH DAILY WITH SUPPER.    Allergies  Allergen Reactions  . Crestor [Rosuvastatin] Other (See Comments)    myalgias  . Statins Other (See Comments)    Muscle pains, mouth sores  . Zetia [Ezetimibe] Other (See Comments)    Broke out in sores/ muscle pain  . Iodine Swelling and Rash    This is with topical betadine only.  No issues, per patient, with CT contrast. 10/16/21 J. Gretta Cool, Therapist, sports  . Latex Rash  Bandages not gloves  . Lipitor [Atorvastatin] Other (See Comments)    Knee pain  . Livalo [Pitavastatin] Other (See Comments)    Muscle pain  . Spironolactone Other (See Comments)    Soreness  . Vaseline [Petrolatum] Rash    Break out  . Zocor [Simvastatin] Other (See Comments)    myalgias    SOCIAL HISTORY/FAMILY HISTORY   Reviewed in Epic:  Pertinent findings:  Social History   Tobacco Use  . Smoking status: Never  . Smokeless tobacco: Never  Vaping Use  . Vaping Use: Never used  Substance Use Topics  . Alcohol use: Yes    Comment: Rarely  . Drug use: No   Social History   Social History Narrative   He is a married father of 1, grandfather of 1.     Lives with his wife.      Now started back in an exercise routine trying to exercise least 30 minutes a day, and doing outdoor activities including yard work/landscaping and planting.    OBJCTIVE -PE, EKG, labs   Wt Readings from Last 3 Encounters:  06/09/22 245 lb (111.1 kg)  12/04/21 246 lb 9.6 oz (111.9 kg)  07/18/21 240 lb (108.9 kg)    Physical Exam: BP 118/88   Pulse 66   Ht 5\' 9"  (1.753 m)   Wt 245 lb (111.1 kg)   SpO2 96%   BMI 36.18 kg/m  Physical Exam Vitals reviewed.  Constitutional:      Appearance: He is obese. He is ill-appearing  (Mild, chronic ill-appearing.). He is not toxic-appearing.  HENT:     Head: Normocephalic and atraumatic.  Eyes:     Extraocular Movements: Extraocular movements intact.  Neck:     Vascular: JVD (Roughly 8 cm H2O.) present. No carotid bruit or hepatojugular reflux.  Cardiovascular:     Rate and Rhythm: Normal rate and regular rhythm. Occasional Extrasystoles are present.    Chest Wall: PMI is not displaced.     Pulses: Intact distal pulses. Decreased pulses.     Heart sounds: S1 normal. No murmur heard.    No friction rub. Gallop present. S4 sounds present.  Pulmonary:     Effort: No respiratory distress.     Breath sounds: No stridor. Rales present.  Chest:     Chest wall: No tenderness.  Abdominal:     General: Abdomen is flat. Bowel sounds are normal. There is no distension.     Palpations: Abdomen is soft.     Tenderness: There is no abdominal tenderness. There is no right CVA tenderness or left CVA tenderness.  Musculoskeletal:        General: Swelling present.     Cervical back: Full passive range of motion without pain, normal range of motion and neck supple.     Right lower leg: No edema.  Skin:    General: Skin is warm and dry.  Neurological:     General: No focal deficit present.     Mental Status: He is alert and oriented to person, place, and time. Mental status is at baseline.     Cranial Nerves: No cranial nerve deficit.     Gait: Gait normal.  Psychiatric:        Mood and Affect: Mood normal.        Behavior: Behavior normal.        Thought Content: Thought content normal.        Judgment: Judgment normal.     Comments: anxious  Adult ECG Report Not checked  Recent Labs: Checked today Lab Results  Component Value Date   CHOL 141 06/09/2022   HDL 45 06/09/2022   LDLCALC 68 06/09/2022   TRIG 167 (H) 06/09/2022   CHOLHDL 3.1 06/09/2022   Lab Results  Component Value Date   CREATININE 1.24 06/09/2022   BUN 14 06/09/2022   NA 140 06/09/2022   K 4.5  06/09/2022   CL 97 06/09/2022   CO2 29 06/09/2022   Lab Results  Component Value Date   ALT 29 06/09/2022   AST 24 06/09/2022   ALKPHOS 66 06/09/2022   BILITOT 0.3 06/09/2022        Latest Ref Rng & Units 03/05/2021   12:19 PM 01/31/2021   11:07 AM 09/04/2020    9:36 AM  CBC  WBC 4.0 - 10.5 K/uL 7.4  6.8  11.2   Hemoglobin 13.0 - 17.0 g/dL 13.8  13.6  12.5   Hematocrit 39.0 - 52.0 % 41.4  41.6  38.1   Platelets 150.0 - 400.0 K/uL 194.0  209  173     Lab Results  Component Value Date   HGBA1C 5.6 01/14/2018   Lab Results  Component Value Date   TSH 1.940 12/04/2021    ================================================== I spent a total of 31 minutes with the patient spent in direct patient consultation.  Additional time spent with chart review  / charting (studies, outside notes, etc): 44 min Total Time: 35 min  Current medicines are reviewed at length with the patient today.  (+/- concerns) none  Notice: This dictation was prepared with Dragon dictation along with smart phrase technology. Any transcriptional errors that result from this process are unintentional and may not be corrected upon review.  Studies Ordered:   Orders Placed This Encounter  Procedures  . CT ANGIO CHEST AORTA W/CM & OR WO/CM  . Lipid panel  . Comprehensive metabolic panel   Meds ordered this encounter  Medications  . furosemide (LASIX) 80 MG tablet    Sig: Take 1 tablet (80 mg total) by mouth 2 (two) times daily. May take an additional 80 mg as needed daily for increase swelling or shortness of breathe    Dispense:  195 tablet    Refill:  3  . metolazone (ZAROXOLYN) 2.5 MG tablet    Sig: TAKE 1 TABLET BY MOUTH EVERY MONDAY, WEDNESDAY, AND FRIDAY. 30 MINUTES PRIOR TO YOUR FUROSEMIDE DOSE    Dispense:  45 tablet    Refill:  3  . nitroGLYCERIN (NITROSTAT) 0.4 MG SL tablet    Sig: Place 1 tablet (0.4 mg total) under the tongue every 5 (five) minutes as needed for chest pain.    Dispense:   25 tablet    Refill:  3  . potassium chloride SA (KLOR-CON M20) 20 MEQ tablet    Sig: Take 1 tablet (20 mEq total) by mouth daily.    Dispense:  90 tablet    Refill:  3  . rosuvastatin (CRESTOR) 5 MG tablet    Sig: Take 1 tablet (5 mg total) by mouth daily.    Dispense:  90 tablet    Refill:  3  . sacubitril-valsartan (ENTRESTO) 49-51 MG    Sig: Take 1 tablet by mouth at bedtime. ,per Dr Ellyn Hack  this is in additional dose than his morning dose.    Dispense:  90 tablet    Refill:  3    ,per Dr Ellyn Hack  this is in additional dose than his  morning dose.  . rivaroxaban (XARELTO) 20 MG TABS tablet    Sig: Take 1 tablet (20 mg total) by mouth daily with supper.    Dispense:  90 tablet    Refill:  2    Patient Instructions / Medication Changes & Studies & Tests Ordered   Patient Instructions  Medication Instructions:    Changes Lasix (furosemide)  to 80 mg twice a day  - may take an extra 80 mg if needed daily      *If you need a refill on your cardiac medications before your next appointment, please call your pharmacy*   Lab Work: Lipid CMP  If you have labs (blood work) drawn today and your tests are completely normal, you will receive your results only by: MyChart Message (if you have MyChart) OR A paper copy in the mail If you have any lab test that is abnormal or we need to change your treatment, we will call you to review the results.   Testing/Procedures will be schedule at [facility] - radiology  Non-Cardiac CT scanning, (CAT scanning), Aorta is a noninvasive, special x-ray that produces cross-sectional images of the body using x-rays and a computer. CT scans help physicians diagnose and treat medical conditions. For some CT exams, a contrast material is used to enhance visibility in the area of the body being studied. CT scans provide greater clarity and reveal more details than regular x-ray exams.   Follow-Up: At [facility], you and your health needs are  our priority.  As part of our continuing mission to provide you with exceptional heart care, we have created designated Provider Care Teams.  These Care Teams include your primary Cardiologist (physician) and Advanced Practice Providers (APPs -  Physician Assistants and Nurse Practitioners) who all work together to provide you with the care you need, when you need it.     Your next appointment:   6 month(s)  The format for your next appointment:   In Person  Provider:   [name], MD   Your physician recommends that you schedule a follow-up appointment in:  Dr Inda Merlin, M.D., M.S. Interventional Cardiologist  [place]  Pager # [phone] Phone # [phone] [address] [place], [place] [address]   Thank you for choosing [facility] [place]!!

## 2022-06-09 NOTE — Assessment & Plan Note (Signed)
Still having issues or weight gain.  Randall Best has been stable now for the last 6 months but not any significant gain.  But does not able to lose.  Had considered Wegovy, was not able to tolerate both from a symptom standpoint and cost.  Combination of the cholesterol Wegovy along with Xarelto, Entresto, Eplerenone and Praluent was untenable.

## 2022-06-09 NOTE — Assessment & Plan Note (Signed)
Status post CRT-P

## 2022-06-09 NOTE — Assessment & Plan Note (Signed)
Recently evaluated ESR and CRP.Randall Best  Normal. LFTs normal. Needs TFTs checked next time.  We will consider PFTs next visit.

## 2022-06-09 NOTE — Assessment & Plan Note (Addendum)
On Xarelto. -  Okay to hold Xarelto 2 to 3 days preop for surgeries or procedures.  This patients CHA2DS2-VASc Score and unadjusted Ischemic Stroke Rate (% per year) is equal to 4.8 % stroke rate/year from a score of 4  Above score calculated as 1 point each if present [CHF, HTN, DM, Vascular=MI/PAD/Aortic Plaque, Age if 65-74, or Male] Above score calculated as 2 points each if present [Age > 75, or Stroke/TIA/TE]

## 2022-06-09 NOTE — Assessment & Plan Note (Signed)
NYHA Class IIB-IIIA CHF symptoms still with some smothering sensation and inconsistent edema.  No hospitalizations since that time.  Major issues smothering sensations and edema as well as fatigue. He says his weight is up and down, but overall is gradually slowly increasing over the last couple years height-still having issues with controlling his appetite.  At least his weight is stable from last visit.  Unfortunate, he seems taken more Lasix than he had been in the past.  Plan:   He is on a stable dose of carvedilol at 3.25 mg daily.  Blood pressure have not tolerated going further.    On digoxin. => We will need to check labs next visit.  Similarly, he is on an interesting dosage of Entresto taking the lower dose 24/26 mg in the morning but taking the next dose up 49 and 51 mg in the evening.  The intention was to have better control at night but less aggressive treatment during the day to allow for more stability and gait and energy level.  I am not exactly sure why this is so difficult for insurance companies and pharmacies understand.  Increase standing dose Lasix to 80 mg twice daily with additional 40 to 80 mg daily as needed for worsening edema or Weight gain more than 3 pounds.  Continue metolazone 3 days a week and additional as needed.  Continue eplerenone (intolerant of spironolactone.

## 2022-06-09 NOTE — Assessment & Plan Note (Signed)
Doing well on Praluent and low-dose Crestor.  Plan: Check labs-lipid panel today.

## 2022-06-09 NOTE — Patient Instructions (Addendum)
Medication Instructions:    Changes Lasix (furosemide)  to 80 mg twice a day  - may take an extra 80 mg if needed daily      *If you need a refill on your cardiac medications before your next appointment, please call your pharmacy*   Lab Work: Lipid CMP  If you have labs (blood work) drawn today and your tests are completely normal, you will receive your results only by: [place] (if you have MyChart) OR A paper copy in the mail If you have any lab test that is abnormal or we need to change your treatment, we will call you to review the results.   Testing/Procedures will be schedule at [facility] - radiology  Non-Cardiac CT scanning, (CAT scanning), Aorta is a noninvasive, special x-ray that produces cross-sectional images of the body using x-rays and a computer. CT scans help physicians diagnose and treat medical conditions. For some CT exams, a contrast material is used to enhance visibility in the area of the body being studied. CT scans provide greater clarity and reveal more details than regular x-ray exams.   Follow-Up: At [facility], you and your health needs are our priority.  As part of our continuing mission to provide you with exceptional heart care, we have created designated Provider Care Teams.  These Care Teams include your primary Cardiologist (physician) and Advanced Practice Providers (APPs -  Physician Assistants and Nurse Practitioners) who all work together to provide you with the care you need, when you need it.     Your next appointment:   6 month(s)  The format for your next appointment:   In Person  Provider:   [name], MD   Your physician recommends that you schedule a follow-up appointment in:  Dr Lovena Le

## 2022-06-09 NOTE — Assessment & Plan Note (Addendum)
Probably related to combination A-fib and LBBB.  Unfortunately, no real improvement in EF with CRT-P.  Blood pressure has been somewhat limiting as far as how aggressive we continue with management:  With him being on amiodarone for rate/rhythm control, we have not been on the push carvedilol up beyond 3.125 mg twice daily.  He did not tolerate twice daily dosing of the 49 and 51 mg Entresto citing dizziness and fatigue during the day.  We therefore cut his dose down in the morning to the 24-26 mg tablet and he actually is tolerating it well.  (This is the cause of much consternation amongst the pharmacists, pharmacies and insurance company.

## 2022-06-09 NOTE — Assessment & Plan Note (Signed)
Should be due for follow-up CT scan soon.

## 2022-06-09 NOTE — Assessment & Plan Note (Signed)
Notably improved having converted from spironolactone to eplerenone.

## 2022-06-11 ENCOUNTER — Telehealth: Payer: Self-pay | Admitting: Pharmacist Clinician (PhC)/ Clinical Pharmacy Specialist

## 2022-06-11 NOTE — Telephone Encounter (Signed)
Praluent PA approved to 10/12/2022  Key: [id number]

## 2022-06-14 ENCOUNTER — Other Ambulatory Visit: Payer: Self-pay | Admitting: Cardiology

## 2022-06-14 DIAGNOSIS — I5043 Acute on chronic combined systolic (congestive) and diastolic (congestive) heart failure: Secondary | ICD-10-CM

## 2022-06-14 DIAGNOSIS — I5042 Chronic combined systolic (congestive) and diastolic (congestive) heart failure: Secondary | ICD-10-CM

## 2022-06-27 ENCOUNTER — Ambulatory Visit (HOSPITAL_COMMUNITY)
Admission: RE | Admit: 2022-06-27 | Discharge: 2022-06-27 | Disposition: A | Discharge status: Home / Self Care | Payer: Medicare Other | Source: Ambulatory Visit | Attending: Cardiology | Admitting: Cardiology

## 2022-06-27 DIAGNOSIS — I7121 Aneurysm of the ascending aorta, without rupture: Secondary | ICD-10-CM | POA: Insufficient documentation

## 2022-06-27 DIAGNOSIS — I420 Dilated cardiomyopathy: Secondary | ICD-10-CM | POA: Diagnosis not present

## 2022-06-27 MED ORDER — IOHEXOL 350 MG/ML SOLN
50.0000 mL | Freq: Once | INTRAVENOUS | Status: AC | PRN
  Administered 2022-06-27: 50 mL via INTRAVENOUS

## 2022-07-02 ENCOUNTER — Encounter: Payer: Self-pay | Admitting: Internal Medicine

## 2022-07-02 ENCOUNTER — Ambulatory Visit: Payer: Medicare Other | Admitting: Internal Medicine

## 2022-07-02 VITALS — BP 110/82 | HR 72 | Ht 69.0 in | Wt 241.0 lb

## 2022-07-02 DIAGNOSIS — K219 Gastro-esophageal reflux disease without esophagitis: Secondary | ICD-10-CM

## 2022-07-02 MED ORDER — OMEPRAZOLE 20 MG PO CPDR: 20.0000 mg | DELAYED_RELEASE_CAPSULE | Freq: Every day | ORAL | 3 refills | Status: DC

## 2022-07-02 NOTE — Progress Notes (Signed)
HISTORY OF PRESENT ILLNESS:  Randall Best is a 71 y.o. male with multiple significant medical problems as listed below, including atrial fibrillation for which he is on chronic anticoagulation therapy.  Patient presents today regarding management of his chronic GERD and request medication refill.  He takes omeprazole 20 mg daily.  He tells me that the medication controls his symptoms beautifully.  No appreciable medication side effects.  No dysphagia.  He was last seen in this office February 09, 2020 regarding minor rectal bleeding secondary to hemorrhoids.  He was prescribed Anusol suppositories.  His problem has resolved.  His last complete colonoscopy was January 2019.  1 diminutive adenoma.  Today's GI review of systems is negative except for weight gain  REVIEW OF SYSTEMS:  All non-GI ROS negative unless otherwise stated in the HPI except for irregular heartbeat, ankle edema, forgetfulness, fatigue  Past Medical History:  Diagnosis Date   Asthma    as child   Atrial fibrillation, rapid ([place]) 12/08/2017   Diagnosed during colonoscopy -presumably new onset   BBB (bundle branch block)    GERD (gastroesophageal reflux disease)    use meds prn   Glaucoma    Headache    remote h/o migraines, none in years   Hyperlipidemia    NICM (nonischemic cardiomyopathy) ([place]) 04/2019   Echo 05/2019 - EF reduced to <20%.  Has permanent Afib & LBBB (normal Coronaries on Cath). -->  After cardioversion and titration medications, EF up to 35-40% with  global hypokinesis..   Thoracic aortic aneurysm ([place]) 10/20/2019   Ascending thoracic aortic estimated 44 mm on echo   TIA (transient ischemic attack) 01/13/2018   Ventral hernia     Past Surgical History:  Procedure Laterality Date   BIV PACEMAKER INSERTION CRT-P N/A 08/01/2020   Procedure: BIV PACEMAKER INSERTION CRT-P;  Surgeon: Constance Haw, MD;  Location: [facility];  Service: Cardiovascular;  Laterality: N/A;   CARDIAC EVENT MONITOR   12/2016   showed persistent Afib -monitor return in early because of persistent A. fib.  Several episodes of severe RVR with rates in the 170s were noted.    CARDIOVERSION N/A 01/13/2018   Procedure: CARDIOVERSION;  Surgeon: Pixie Casino, MD;  Location: [facility] ENDOSCOPY;  Service: Cardiovascular;  Laterality: N/A; unsuccessful.-->  Post-cardioversion, the patient had left-sided facial numbness and weakness.  Ruled out for stroke.   CARDIOVERSION N/A 08/19/2019   Procedure: CARDIOVERSION;  Surgeon: Larey Dresser, MD;  Location: [facility] ENDOSCOPY;  Service: Cardiovascular;  Laterality: N/A;   INSERTION OF MESH N/A 07/22/2017   Procedure: INSERTION OF MESH;  Surgeon: [name], MD;  Location: [facility];  Service: General;  Laterality: N/A;  BILATERAL TAP BLOCK   LEAD INSERTION N/A 09/11/2020   Procedure: LEAD INSERTION - LV LEAD;  Surgeon: Evans Lance, MD;  Location: [facility];  Service: Cardiovascular;  Laterality: N/A;   NM MYOVIEW LTD  12/2017   Shows reduced EF, but no evidence of ischemia or infarction.  EF 40 and 45%.  INTERMEDIATE RISK due to reduced function.  EF notably better on echo (50 and 55%)   RIGHT/LEFT HEART CATH AND CORONARY ANGIOGRAPHY N/A 07/14/2019   Procedure: RIGHT/LEFT HEART CATH AND CORONARY ANGIOGRAPHY;  Surgeon: [name], MD;  Location: [facility];; Angiographically normal coronary arteries. RHC  - PCWP & LVEDP 17 mmHg.  CO-CI 4.92-2.34   TEE WITHOUT CARDIOVERSION N/A 01/13/2018   Procedure: TRANSESOPHAGEAL ECHOCARDIOGRAM (TEE) with cardioversion - ;  Surgeon: Pixie Casino, MD;  Location: [facility] ENDOSCOPY;  Service: Cardiovascular;  Laterality: N/A;  Mild concentric LVH.  EF 55 to 60%.  No R WMA.  Mild ascending aortic dilation of 4.2 cm.  Dilated left atrium.  No thrombus.   TONSILLECTOMY     TRANSTHORACIC ECHOCARDIOGRAM  10/11/2019   EF up to 35-40%.-Moderate-severely decreased function.  "GR 1 DD ".  Normal atrial sizes.  Moderate  ascending aorta-44 mL.  Relatively normal valves.   TRANSTHORACIC ECHOCARDIOGRAM  06/2019   a) 06/2019: Severely reduced  LV Fxn w/th diffuse  HK & and septal dyskinesis.  EF <20%.  Reduced RV fxn w/ mildly elevated RVP--37 mmHg.  Mod R & Mild L Atrial dilation.  Asc Ao 42 mm; b) 10/2021:  EF 40%.  GR 1 DD.  Mild LVH.  Unable to assess PAP.  AoV sclerosis w/o stenosis.  Nl RAP.  Mild Asc Ao dilation.   VENTRAL HERNIA REPAIR N/A 07/22/2017   Procedure: OPEN VENTRAL HERNIA REPAIR WITH MESH ERAS PATHWAY;  Surgeon: [name], MD;  Location: [facility];  Service: General;  Laterality: N/A;  BILATERAL TAP BLOCK    Social History Randall Best  reports that he has never smoked. He has never used smokeless tobacco. He reports current alcohol use. He reports that he does not use drugs.  family history includes CVA in his maternal grandmother; Diabetes in his mother; Heart failure in his father; Hypertension in his brother.  Allergies  Allergen Reactions   Crestor [Rosuvastatin] Other (See Comments)    myalgias   Statins Other (See Comments)    Muscle pains, mouth sores   Zetia [Ezetimibe] Other (See Comments)    Broke out in sores/ muscle pain   Iodine Swelling and Rash    This is with topical betadine only.  No issues, per patient, with CT contrast. 10/16/21 J. Lohr, RN   Latex Rash    Bandages not gloves   Lipitor [Atorvastatin] Other (See Comments)    Knee pain   Livalo [Pitavastatin] Other (See Comments)    Muscle pain   Spironolactone Other (See Comments)    Soreness   Vaseline [Petrolatum] Rash    Break out   Zocor [Simvastatin] Other (See Comments)    myalgias       PHYSICAL EXAMINATION: Vital signs: BP 110/82   Pulse 72   Ht '5\' 9"'$  (1.753 m)   Wt 241 lb (109.3 kg)   BMI 35.59 kg/m   Constitutional: generally well-appearing, no acute distress Psychiatric: alert and oriented x3, cooperative Eyes: extraocular movements intact, anicteric, conjunctiva pink Mouth:   mask Neck: supple no lymphadenopathy Cardiovascular: heart regular rate and rhythm, no murmur Lungs: clear to auscultation bilaterally Abdomen: soft, obese, nontender, nondistended, no obvious ascites, no peritoneal signs, normal bowel sounds, no organomegaly Rectal: Omitted Extremities: no clubbing or cyanosis.  Trace lower extremity edema bilaterally Skin: no lesions on visible extremities Neuro: No focal deficits.  Cranial nerves intact  ASSESSMENT:  1.  Chronic GERD without alarm features.  On PPI 2.  Chronic PPI to reduce the risk of GI bleeding and the patient on chronic anticoagulation. 3.  History of diminutive adenoma 2019 4.  Multiple medical problems   PLAN: 1.  Refill omeprazole 20 mg daily.  1 year refills.  Medication risks reviewed 2.  Change colonoscopy recall at 2029 to follow my with most recent guidelines 3.  Resume general medical care with PCP.  GI follow-up as needed

## 2022-07-02 NOTE — Patient Instructions (Addendum)
_______________________________________________________  If you are age 70 or older, your body mass index should be between 23-30. Your Body mass index is 35.59 kg/m. If this is out of the aforementioned range listed, please consider follow up with your Primary Care Provider.  If you are age 82 or younger, your body mass index should be between 19-25. Your Body mass index is 35.59 kg/m. If this is out of the aformentioned range listed, please consider follow up with your Primary Care Provider.   ________________________________________________________  The [place] GI providers would like to encourage you to use [facility] to communicate with providers for non-urgent requests or questions.  Due to long hold times on the telephone, sending your provider a message by [facility] may be a faster and more efficient way to get a response.  Please allow 48 business hours for a response.  Please remember that this is for non-urgent requests.  _______________________________________________________  We have sent the following medications to your pharmacy for you to pick up at your convenience:  Omeprazole

## 2022-07-10 ENCOUNTER — Other Ambulatory Visit: Payer: Self-pay | Admitting: Cardiology

## 2022-07-16 IMAGING — CR DG CHEST 2V
2 series · 2 of 2 positions shown · non-contrast
Comparison: None.

CLINICAL DATA: Pacemaker placement.

EXAM:
CHEST - 2 VIEW

[w chest pa]
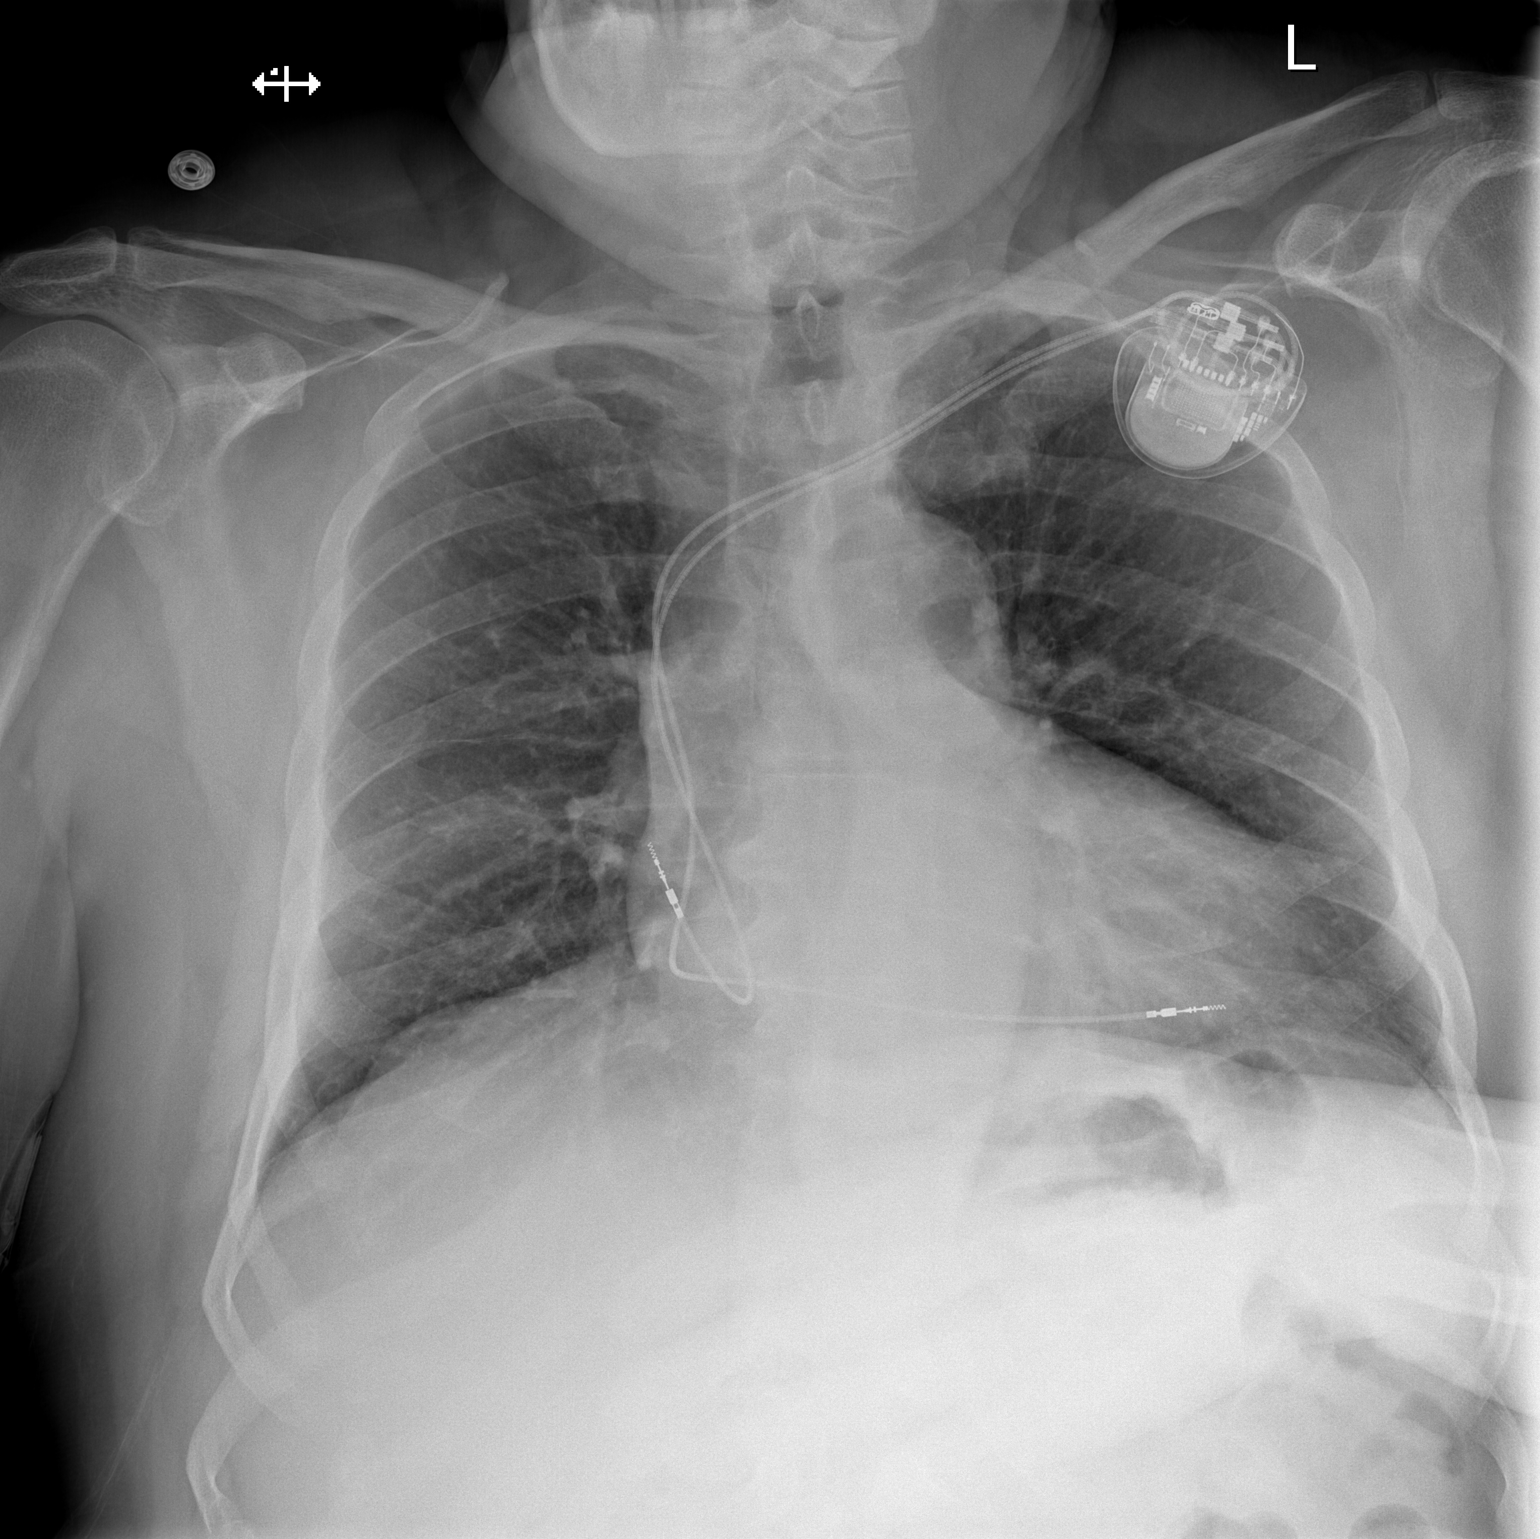

[w chest lat]
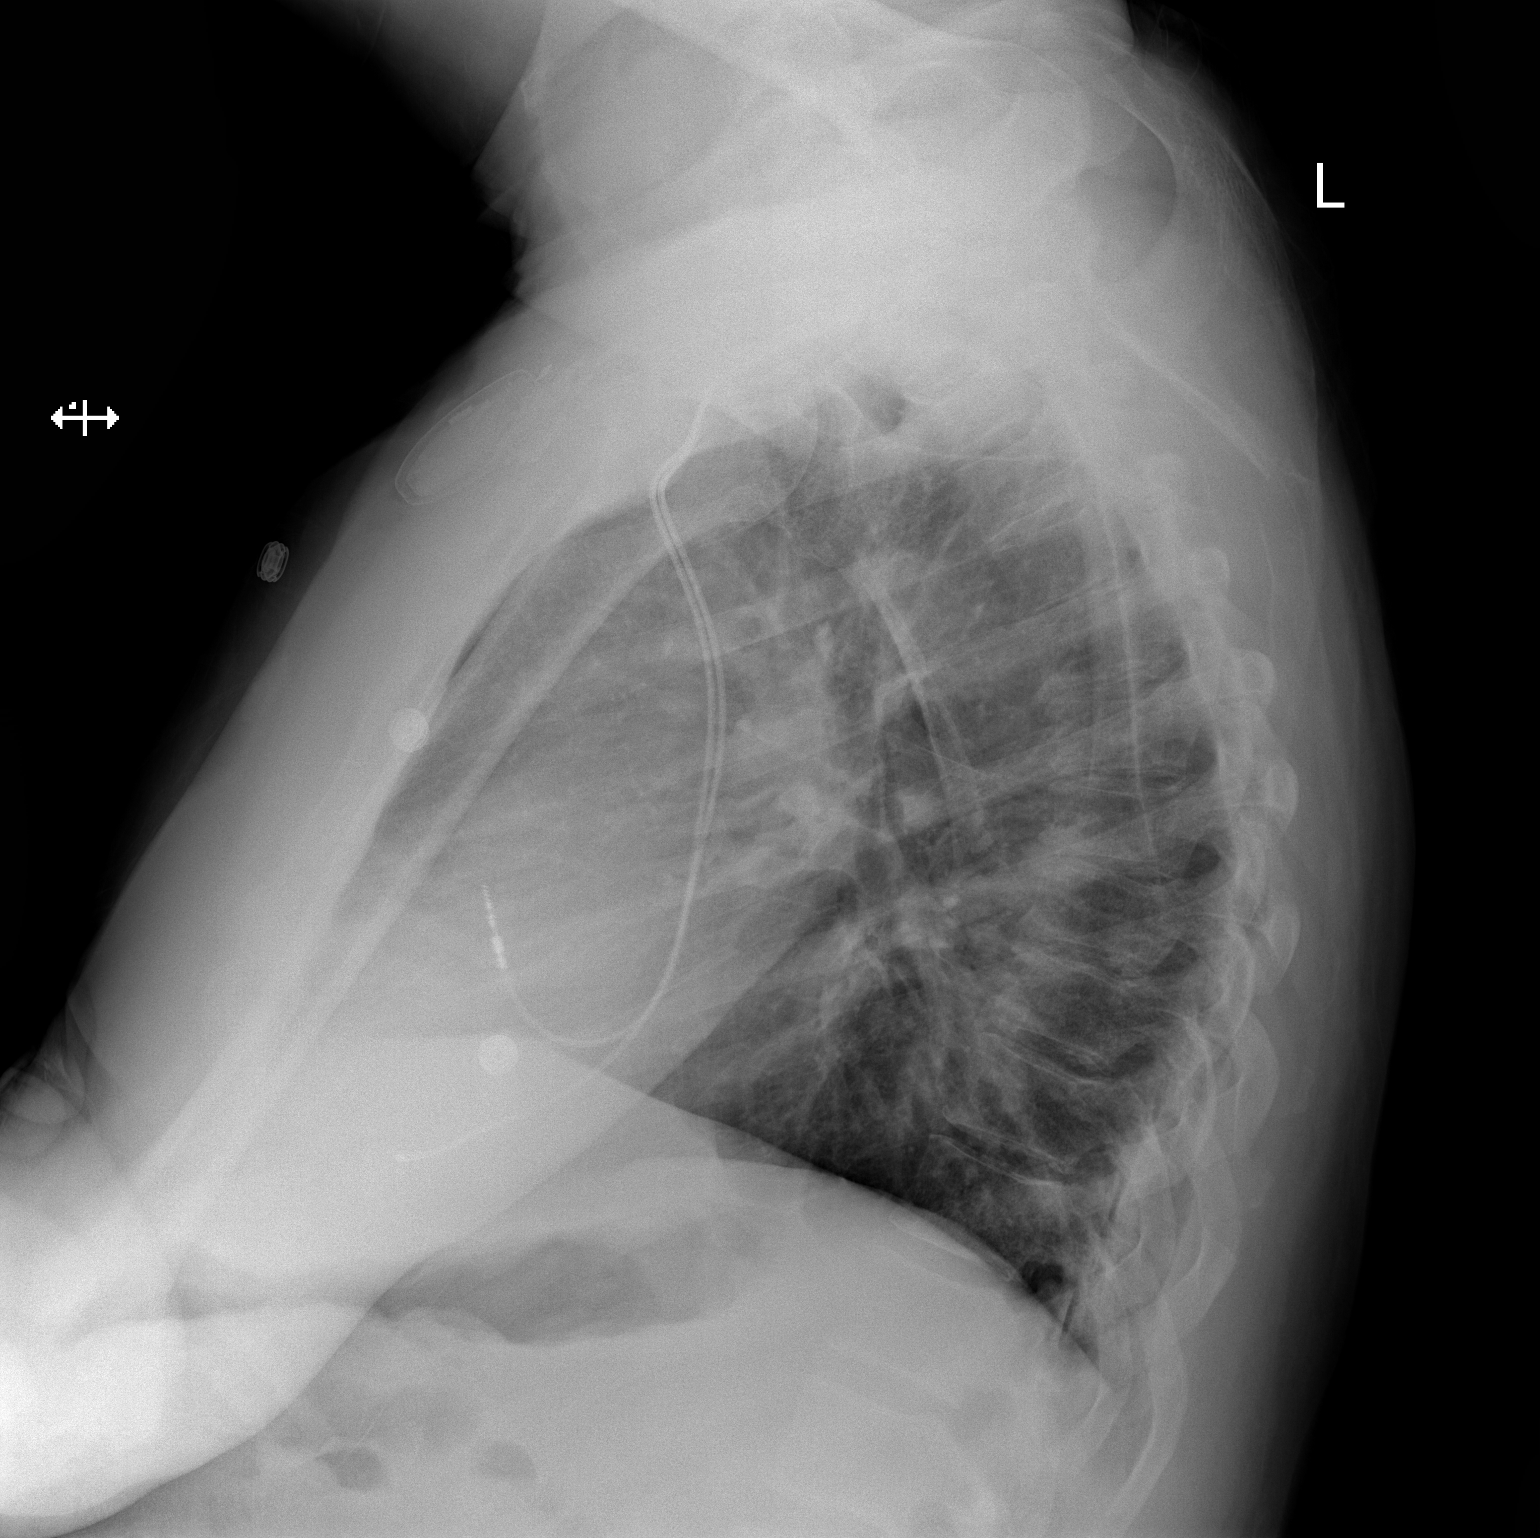

[2 of 2 positions shown; findings below may reference images not displayed]

FINDINGS: Left chest wall pacemaker with leads terminating in the right atrium
and right ventricle. Mild cardiomegaly. Normal pulmonary
vascularity. No focal consolidation, pleural effusion, or
pneumothorax. No acute osseous abnormality.
IMPRESSION: 1. Left chest wall pacemaker placement without complicating feature.

## 2022-08-08 ENCOUNTER — Ambulatory Visit (INDEPENDENT_AMBULATORY_CARE_PROVIDER_SITE_OTHER): Payer: Medicare Other

## 2022-08-08 DIAGNOSIS — E785 Hyperlipidemia, unspecified: Secondary | ICD-10-CM | POA: Diagnosis not present

## 2022-08-08 DIAGNOSIS — D6869 Other thrombophilia: Secondary | ICD-10-CM | POA: Diagnosis not present

## 2022-08-08 DIAGNOSIS — Z7901 Long term (current) use of anticoagulants: Secondary | ICD-10-CM | POA: Diagnosis not present

## 2022-08-08 DIAGNOSIS — I420 Dilated cardiomyopathy: Secondary | ICD-10-CM

## 2022-08-08 DIAGNOSIS — I480 Paroxysmal atrial fibrillation: Secondary | ICD-10-CM | POA: Diagnosis not present

## 2022-08-08 DIAGNOSIS — J0110 Acute frontal sinusitis, unspecified: Secondary | ICD-10-CM | POA: Diagnosis not present

## 2022-08-08 DIAGNOSIS — I7121 Aneurysm of the ascending aorta, without rupture: Secondary | ICD-10-CM | POA: Diagnosis not present

## 2022-08-08 DIAGNOSIS — Z950 Presence of cardiac pacemaker: Secondary | ICD-10-CM | POA: Diagnosis not present

## 2022-08-08 DIAGNOSIS — Z0000 Encounter for general adult medical examination without abnormal findings: Secondary | ICD-10-CM | POA: Diagnosis not present

## 2022-08-08 LAB — CUP PACEART REMOTE DEVICE CHECK
Battery Remaining Longevity: 99 mo
Battery Voltage: 3 V
Brady Statistic AP VP Percent: 64.93 %
Brady Statistic AP VS Percent: 0.64 %
Brady Statistic AS VP Percent: 34.07 %
Brady Statistic AS VS Percent: 0.36 %
Brady Statistic RA Percent Paced: 65.53 %
Brady Statistic RV Percent Paced: 99 %
Date Time Interrogation Session: 20230929013617
Implantable Lead Implant Date: 20210922
Implantable Lead Implant Date: 20211102
Implantable Lead Implant Date: 20211102
Implantable Lead Location: 753858
Implantable Lead Location: 753859
Implantable Lead Location: 753860
Implantable Lead Model: 3830
Implantable Lead Model: 4196
Implantable Lead Model: 5076
Implantable Pulse Generator Implant Date: 20210922
Lead Channel Impedance Value: 323 Ohm
Lead Channel Impedance Value: 342 Ohm
Lead Channel Impedance Value: 342 Ohm
Lead Channel Impedance Value: 399 Ohm
Lead Channel Impedance Value: 456 Ohm
Lead Channel Impedance Value: 494 Ohm
Lead Channel Impedance Value: 551 Ohm
Lead Channel Impedance Value: 646 Ohm
Lead Channel Impedance Value: 760 Ohm
Lead Channel Pacing Threshold Amplitude: 0.5 V
Lead Channel Pacing Threshold Amplitude: 0.875 V
Lead Channel Pacing Threshold Pulse Width: 0.4 ms
Lead Channel Pacing Threshold Pulse Width: 0.4 ms
Lead Channel Sensing Intrinsic Amplitude: 2.375 mV
Lead Channel Sensing Intrinsic Amplitude: 2.375 mV
Lead Channel Sensing Intrinsic Amplitude: 9.625 mV
Lead Channel Sensing Intrinsic Amplitude: 9.625 mV
Lead Channel Setting Pacing Amplitude: 1.5 V
Lead Channel Setting Pacing Amplitude: 2 V
Lead Channel Setting Pacing Amplitude: 2 V
Lead Channel Setting Pacing Pulse Width: 0.4 ms
Lead Channel Setting Pacing Pulse Width: 0.4 ms
Lead Channel Setting Sensing Sensitivity: 0.9 mV

## 2022-08-13 NOTE — Progress Notes (Signed)
Remote pacemaker transmission.   

## 2022-08-20 ENCOUNTER — Encounter: Payer: Self-pay | Admitting: Cardiology

## 2022-08-20 ENCOUNTER — Ambulatory Visit: Payer: Medicare Other | Attending: Cardiology | Admitting: Cardiology

## 2022-08-20 VITALS — BP 130/84 | HR 81 | Ht 69.0 in | Wt 248.8 lb

## 2022-08-20 DIAGNOSIS — R0602 Shortness of breath: Secondary | ICD-10-CM | POA: Diagnosis not present

## 2022-08-20 DIAGNOSIS — I4819 Other persistent atrial fibrillation: Secondary | ICD-10-CM | POA: Diagnosis not present

## 2022-08-20 DIAGNOSIS — D6869 Other thrombophilia: Secondary | ICD-10-CM

## 2022-08-20 DIAGNOSIS — I5022 Chronic systolic (congestive) heart failure: Secondary | ICD-10-CM | POA: Diagnosis not present

## 2022-08-20 NOTE — Patient Instructions (Signed)
Medication Instructions:  Your physician recommends that you continue on your current medications as directed. Please refer to the Current Medication list given to you today.  *If you need a refill on your cardiac medications before your next appointment, please call your pharmacy*   Lab Work: Today:  BMET & BNP  If you have labs (blood work) drawn today and your tests are completely normal, you will receive your results only by: [place] (if you have MyChart) OR A paper copy in the mail If you have any lab test that is abnormal or we need to change your treatment, we will call you to review the results.   Testing/Procedures: None ordered   Follow-Up: At [facility], you and your health needs are our priority.  As part of our continuing mission to provide you with exceptional heart care, we have created designated Provider Care Teams.  These Care Teams include your primary Cardiologist (physician) and Advanced Practice Providers (APPs -  Physician Assistants and Nurse Practitioners) who all work together to provide you with the care you need, when you need it.  Remote monitoring is used to monitor your Pacemaker or ICD from home. This monitoring reduces the number of office visits required to check your device to one time per year. It allows [place] to keep an eye on the functioning of your device to ensure it is working properly. You are scheduled for a device check from home on 11/07/2022. You may send your transmission at any time that day. If you have a wireless device, the transmission will be sent automatically. After your physician reviews your transmission, you will receive a postcard with your next transmission date.  Your next appointment:   1 year(s)  The format for your next appointment:   In Person  Provider:   Allegra Lai, MD    Thank you for choosing [facility]!!   Trinidad Curet, RN [phone]    Other Instructions  Important Information About  [place]

## 2022-08-20 NOTE — Progress Notes (Signed)
Electrophysiology Office Note   Date:  08/20/2022   ID:  Randall Best, DOB 1951-09-24, MRN [id number]  PCP:  Ramiro Harvest PA-C  Cardiologist:  Ellyn Hack Primary Electrophysiologist:  Jamice Carreno Meredith Leeds, MD    Chief Complaint:    History of Present Illness: Randall Best is a 71 y.o. male who is being seen today for the evaluation of AF, CHF at the request of [name]. Presenting today for electrophysiology evaluation.  Has a history stated for nonischemic cardiomyopathy, persistent atrial fibrillation, left bundle branch block.  He had a recent left heart catheterization that showed well compensated cardiomyopathy without evidence of coronary artery disease.  He is now status post Medtronic CRT-P implanted 08/01/2020  Today, denies symptoms of palpitations, chest pain, shortness of breath, PND, lower extremity edema, claudication, dizziness, presyncope, syncope, bleeding, or neurologic sequela. The patient is tolerating medications without difficulties.  Has been having issues with fluid.  He has no chest pain, but does have significant shortness of breath.  He states that he has 3-4 pillow orthopnea.  He is sleeping with his bed up as high as it Kilani Joffe go.  His diuretics were recently adjusted by his primary cardiologist which he says mildly improved his symptoms, though he does continue to have shortness of breath.   Past Medical History:  Diagnosis Date   Asthma    as child   Atrial fibrillation, rapid ([place]) 12/08/2017   Diagnosed during colonoscopy -presumably new onset   BBB (bundle branch block)    GERD (gastroesophageal reflux disease)    use meds prn   Glaucoma    Headache    remote h/o migraines, none in years   Hyperlipidemia    NICM (nonischemic cardiomyopathy) ([place]) 04/2019   Echo 05/2019 - EF reduced to <20%.  Has permanent Afib & LBBB (normal Coronaries on Cath). -->  After cardioversion and titration medications, EF up to 35-40% with  global  hypokinesis..   Thoracic aortic aneurysm ([place]) 10/20/2019   Ascending thoracic aortic estimated 44 mm on echo   TIA (transient ischemic attack) 01/13/2018   Ventral hernia    Past Surgical History:  Procedure Laterality Date   BIV PACEMAKER INSERTION CRT-P N/A 08/01/2020   Procedure: BIV PACEMAKER INSERTION CRT-P;  Surgeon: Constance Haw, MD;  Location: [place] [facility];  Service: Cardiovascular;  Laterality: N/A;   CARDIAC EVENT MONITOR  12/2016   showed persistent Afib -monitor return in early because of persistent A. fib.  Several episodes of severe RVR with rates in the 170s were noted.    CARDIOVERSION N/A 01/13/2018   Procedure: CARDIOVERSION;  Surgeon: Pixie Casino, MD;  Location: [facility] ENDOSCOPY;  Service: Cardiovascular;  Laterality: N/A; unsuccessful.-->  Post-cardioversion, the patient had left-sided facial numbness and weakness.  Ruled out for stroke.   CARDIOVERSION N/A 08/19/2019   Procedure: CARDIOVERSION;  Surgeon: Larey Dresser, MD;  Location[facility] ENDOSCOPY;  Service: Cardiovascular;  Laterality: N/A;   INSERTION OF MESH N/A 07/22/2017   Procedure: INSERTION OF MESH;  Surgeon: [name], MD;  Location: [place];  Service: General;  Laterality: N/A;  BILATERAL TAP BLOCK   LEAD INSERTION N/A 09/11/2020   Procedure: LEAD INSERTION - LV LEAD;  Surgeon: Evans Lance, MD;  Location: [place] [facility];  Service: Cardiovascular;  Laterality: N/A;   NM MYOVIEW LTD  12/2017   Shows reduced EF, but no evidence of ischemia or infarction.  EF 40 and 45%.  INTERMEDIATE RISK due to  reduced function.  EF notably better on echo (50 and 55%)   RIGHT/LEFT HEART CATH AND CORONARY ANGIOGRAPHY N/A 07/14/2019   Procedure: RIGHT/LEFT HEART CATH AND CORONARY ANGIOGRAPHY;  Surgeon: [name], MD;  Location: [place] [facility];; Angiographically normal coronary arteries. RHC  - PCWP & LVEDP 17 mmHg.  CO-CI 4.92-2.34   TEE WITHOUT CARDIOVERSION N/A 01/13/2018   Procedure:  TRANSESOPHAGEAL ECHOCARDIOGRAM (TEE) with cardioversion - ;  Surgeon: Pixie Casino, MD;  Location[facility] END[facility]COPY;  Service: Cardiovascular;  Laterality: N/A;  Mild concentric LVH.  EF 55 to 60%.  No R WMA.  Mild ascending aortic dilation of 4.2 cm.  Dilated left atrium.  No thrombus.   TONSILLECTOMY     TRANSTHORACIC ECHOCARDIOGRAM  10/11/2019   EF up to 35-40%.-Moderate-severely decreased function.  "GR 1 DD ".  Normal atrial sizes.  Moderate ascending aorta-44 mL.  Relatively normal valves.   TRANSTHORACIC ECHOCARDIOGRAM  06/2019   a) 06/2019: Severely reduced  LV Fxn w/th diffuse  HK & and septal dyskinesis.  EF <20%.  Reduced RV fxn w/ mildly elevated RVP--37 mmHg.  Mod R & Mild L Atrial dilation.  Asc Ao 42 mm; b) 10/2021:  EF 40%.  GR 1 DD.  Mild LVH.  Unable to assess PAP.  AoV sclerosis w/o stenosis.  Nl RAP.  Mild Asc Ao dilation.   VENTRAL HERNIA REPAIR N/A 07/22/2017   Procedure: OPEN VENTRAL HERNIA REPAIR WITH MESH ERAS PATHWAY;  Surgeon: [name], MD;  Location: [place];  Service: General;  Laterality: N/A;  BILATERAL TAP BLOCK     Current Outpatient Medications  Medication Sig Dispense Refill   acetaminophen (TYLENOL) 500 MG tablet Take 500 mg by mouth every 6 (six) hours as needed for moderate pain.      Alirocumab (PRALUENT) 150 MG/ML SOAJ Inject 150 mg into the skin every 14 (fourteen) days. 6 mL 3   amiodarone (PACERONE) 200 MG tablet TAKE 1 TABLET BY MOUTH DAILY 60 tablet 5   carvedilol (COREG) 3.125 MG tablet TAKE 1 TABLET BY MOUTH TWICE  DAILY 120 tablet 5   Cholecalciferol (VITAMIN D) 50 MCG (2000 UT) tablet Take 2,000 Units by mouth daily.     digoxin (LANOXIN) 0.125 MG tablet TAKE 1 TABLET (125 MCG TOTAL) BY MOUTH DAILY. KEEP OV 90 tablet 1   ENTRESTO 24-26 MG TAKE 1 TABLET BY MOUTH EVERY DAY IN THE MORNING 90 tablet 1   EPLERENONE PO Take by mouth.     furosemide (LASIX) 80 MG tablet Take 1 tablet (80 mg total) by mouth 2 (two) times daily. May take an  additional 80 mg as needed daily for increase swelling or shortness of breathe 195 tablet 3   gabapentin (NEURONTIN) 300 MG capsule 1 capsule     hydrocortisone (ANUSOL-HC) 25 MG suppository PLACE 1 SUPPOSITORY (25 MG TOTAL) RECTALLY AT BEDTIME. *NOT COVERED* 24 suppository 1   metolazone (ZAROXOLYN) 2.5 MG tablet TAKE 1 TABLET BY MOUTH EVERY MONDAY, WEDNESDAY, AND FRIDAY. 30 MINUTES PRIOR TO YOUR FUROSEMIDE DOSE 45 tablet 3   Multiple Vitamins-Minerals (ULTRA MENS PACK PO) Take 1 tablet by mouth daily.     mupirocin ointment (BACTROBAN) 2 % Apply 1 application topically daily.     nitroGLYCERIN (NITROSTAT) 0.4 MG SL tablet Place 1 tablet (0.4 mg total) under the tongue every 5 (five) minutes as needed for chest pain. 25 tablet 3   Omega-3 Fatty Acids (FISH OIL) 1200 MG CAPS Take 1,200 mg by mouth every  other day.     omeprazole (PRILOSEC) 20 MG capsule Take 1 capsule (20 mg total) by mouth daily. 90 capsule 3   potassium chloride SA (KLOR-CON M20) 20 MEQ tablet Take 1 tablet (20 mEq total) by mouth daily. 90 tablet 3   rivaroxaban (XARELTO) 20 MG TABS tablet Take 1 tablet (20 mg total) by mouth daily with supper. 90 tablet 2   rosuvastatin (CRESTOR) 5 MG tablet Take 1 tablet (5 mg total) by mouth daily. 90 tablet 3   sacubitril-valsartan (ENTRESTO) 49-51 MG Take 1 tablet by mouth at bedtime. ,per Dr Ellyn Hack  this is in additional dose than his morning dose. 90 tablet 3   SYMBICORT 160-4.5 MCG/ACT inhaler INHALE 2 PUFFS INTO THE LUNGS IN THE MORNING AND AT BEDTIME. 30.6 each 1   triamcinolone cream (KENALOG) 0.1 % Apply 1 application topically as needed.     No current facility-administered medications for this visit.    Allergies:   Crestor [rosuvastatin], Statins, Zetia [ezetimibe], Iodine, Latex, Lipitor [atorvastatin], Livalo [pitavastatin], Spironolactone, Vaseline [petrolatum], and Zocor [simvastatin]   Social History:  The patient  reports that he has never smoked. He has never used  smokeless tobacco. He reports current alcohol use. He reports that he does not use drugs.   Family History:  The patient's family history includes CVA in his maternal grandmother; Diabetes in his mother; Heart failure in his father; Hypertension in his brother.   ROS:  Please see the history of present illness.   Otherwise, review of systems is positive for none.   All other systems are reviewed and negative.   PHYSICAL EXAM: VS:  BP 130/84   Pulse 81   Ht '5\' 9"'$  (1.753 m)   Wt 248 lb 12.8 oz (112.9 kg)   SpO2 96%   BMI 36.74 kg/m  , BMI Body mass index is 36.74 kg/m. GEN: Well nourished, well developed, in no acute distress  HEENT: normal  Neck: no JVD, carotid bruits, or masses Cardiac: RRR; no murmurs, rubs, or gallops,no edema  Respiratory:  clear to auscultation bilaterally, normal work of breathing GI: soft, nontender, nondistended, + BS MS: no deformity or atrophy  Skin: warm and dry, device site well healed Neuro:  Strength and sensation are intact Psych: euthymic mood, full affect  EKG:  EKG is ordered today. Personal review of the ekg ordered shows sinus rhythm, ventricular paced, rate 81  Personal review of the device interrogation today. Results in [place]: 12/04/2021: TSH 1.940 06/09/2022: ALT 29; BUN 14; Creatinine, Ser 1.24; Potassium 4.5; Sodium 140    Lipid Panel     Component Value Date/Time   CHOL 141 06/09/2022 0921   TRIG 167 (H) 06/09/2022 0921   HDL 45 06/09/2022 0921   CHOLHDL 3.1 06/09/2022 0921   CHOLHDL 4.8 01/14/2018 0631   VLDL 19 01/14/2018 0631   LDLCALC 68 06/09/2022 0921     Wt Readings from Last 3 Encounters:  08/20/22 248 lb 12.8 oz (112.9 kg)  07/02/22 241 lb (109.3 kg)  06/09/22 245 lb (111.1 kg)      Other studies Reviewed: Additional studies/ records that were reviewed today include: TTE 10/23/21 Review of the above records today demonstrates:   1. Left ventricular ejection fraction, by estimation, is 40%.  The left  ventricle has mild to moderately decreased function. The left ventricle  demonstrates global hypokinesis. There is mild left ventricular  hypertrophy. Left ventricular diastolic  parameters are consistent with Grade I diastolic dysfunction (  impaired  relaxation).   2. Right ventricular systolic function is mildly reduced. The right  ventricular size is mildly enlarged. Tricuspid regurgitation signal is  inadequate for assessing PA pressure.   3. The mitral valve is normal in structure. No evidence of mitral valve  regurgitation. No evidence of mitral stenosis.   4. The aortic valve is tricuspid. Aortic valve regurgitation is not  visualized. Aortic valve sclerosis/calcification is present, without any  evidence of aortic stenosis.   5. Aortic dilatation noted. There is mild dilatation of the ascending  aorta, measuring 42 mm.   6. The inferior vena cava is normal in size with greater than 50%  respiratory variability, suggesting right atrial pressure of 3 mmHg.   RHC/LHC 07/14/19 Angiographically normal coronary arteries --------------------- There is severe left ventricular systolic dysfunction. The left ventricular ejection fraction is less than 25% by visual estimate by visual estimation Relatively compensated right heart cath pressures with PCWP and LVEDP of 17 mmHg. Cardiac Output-Index ([place]ick) 4.92-2.34  ASSESSMENT AND PLAN:  1.  Persistent atrial fibrillation: Currently on Xarelto 20 mg daily, amiodarone 200 mg daily.  CHA2DS2-VASc of 4.  Mains in sinus rhythm.  No changes at this time.  2.  Chronic systolic heart failure: Due to nonischemic cardiomyopathy.  He is now status post Medtronic CRT P 08/01/2020.  Unfortunately, he has continued to have heart failure symptoms.  He has 3-4 pillow orthopnea.  We Shaunte Weissinger check a bmet and BNP.  His diuretics were recently adjusted by his primary cardiologist.  We Mahin Guardia alert his primary cardiologist that he is continuing to feel poorly.   He may benefit from advanced heart failure care.  2.  Secondary hypercoagulable state: Currently on Xarelto for atrial fibrillation as above.  Current medicines are reviewed at length with the patient today.   The patient does not have concerns regarding his medicines.  The following changes were made today: None  Labs/ tests ordered today include:  Orders Placed This Encounter  Procedures   Basic metabolic panel   Pro b natriuretic peptide (BNP)   EKG 12-Lead     Disposition:   FU 12 months  Signed, Waylynn Benefiel Meredith Leeds, MD  08/20/2022 2:49 PM     [place] [address] [place] [address] [phone] (office) [phone] (fax)

## 2022-08-21 LAB — BASIC METABOLIC PANEL
BUN/Creatinine Ratio: 9 — ABNORMAL LOW (ref 10–24)
BUN: 9 mg/dL (ref 8–27)
CO2: 24 mmol/L (ref 20–29)
Calcium: 9.5 mg/dL (ref 8.6–10.2)
Chloride: 103 mmol/L (ref 96–106)
Creatinine, Ser: 1.05 mg/dL (ref 0.76–1.27)
Glucose: 96 mg/dL (ref 70–99)
Potassium: 4.2 mmol/L (ref 3.5–5.2)
Sodium: 142 mmol/L (ref 134–144)
eGFR: 76 mL/min/{1.73_m2} (ref >59)

## 2022-08-21 LAB — PRO B NATRIURETIC PEPTIDE: NT-Pro BNP: 71 pg/mL (ref 0–376)

## 2022-08-26 IMAGING — CR DG CHEST 2V
2 series · 2 of 2 positions shown · non-contrast
Comparison: 08/01/2020

CLINICAL DATA: Pacemaker placement

EXAM:
CHEST - 2 VIEW

[w chest pa]
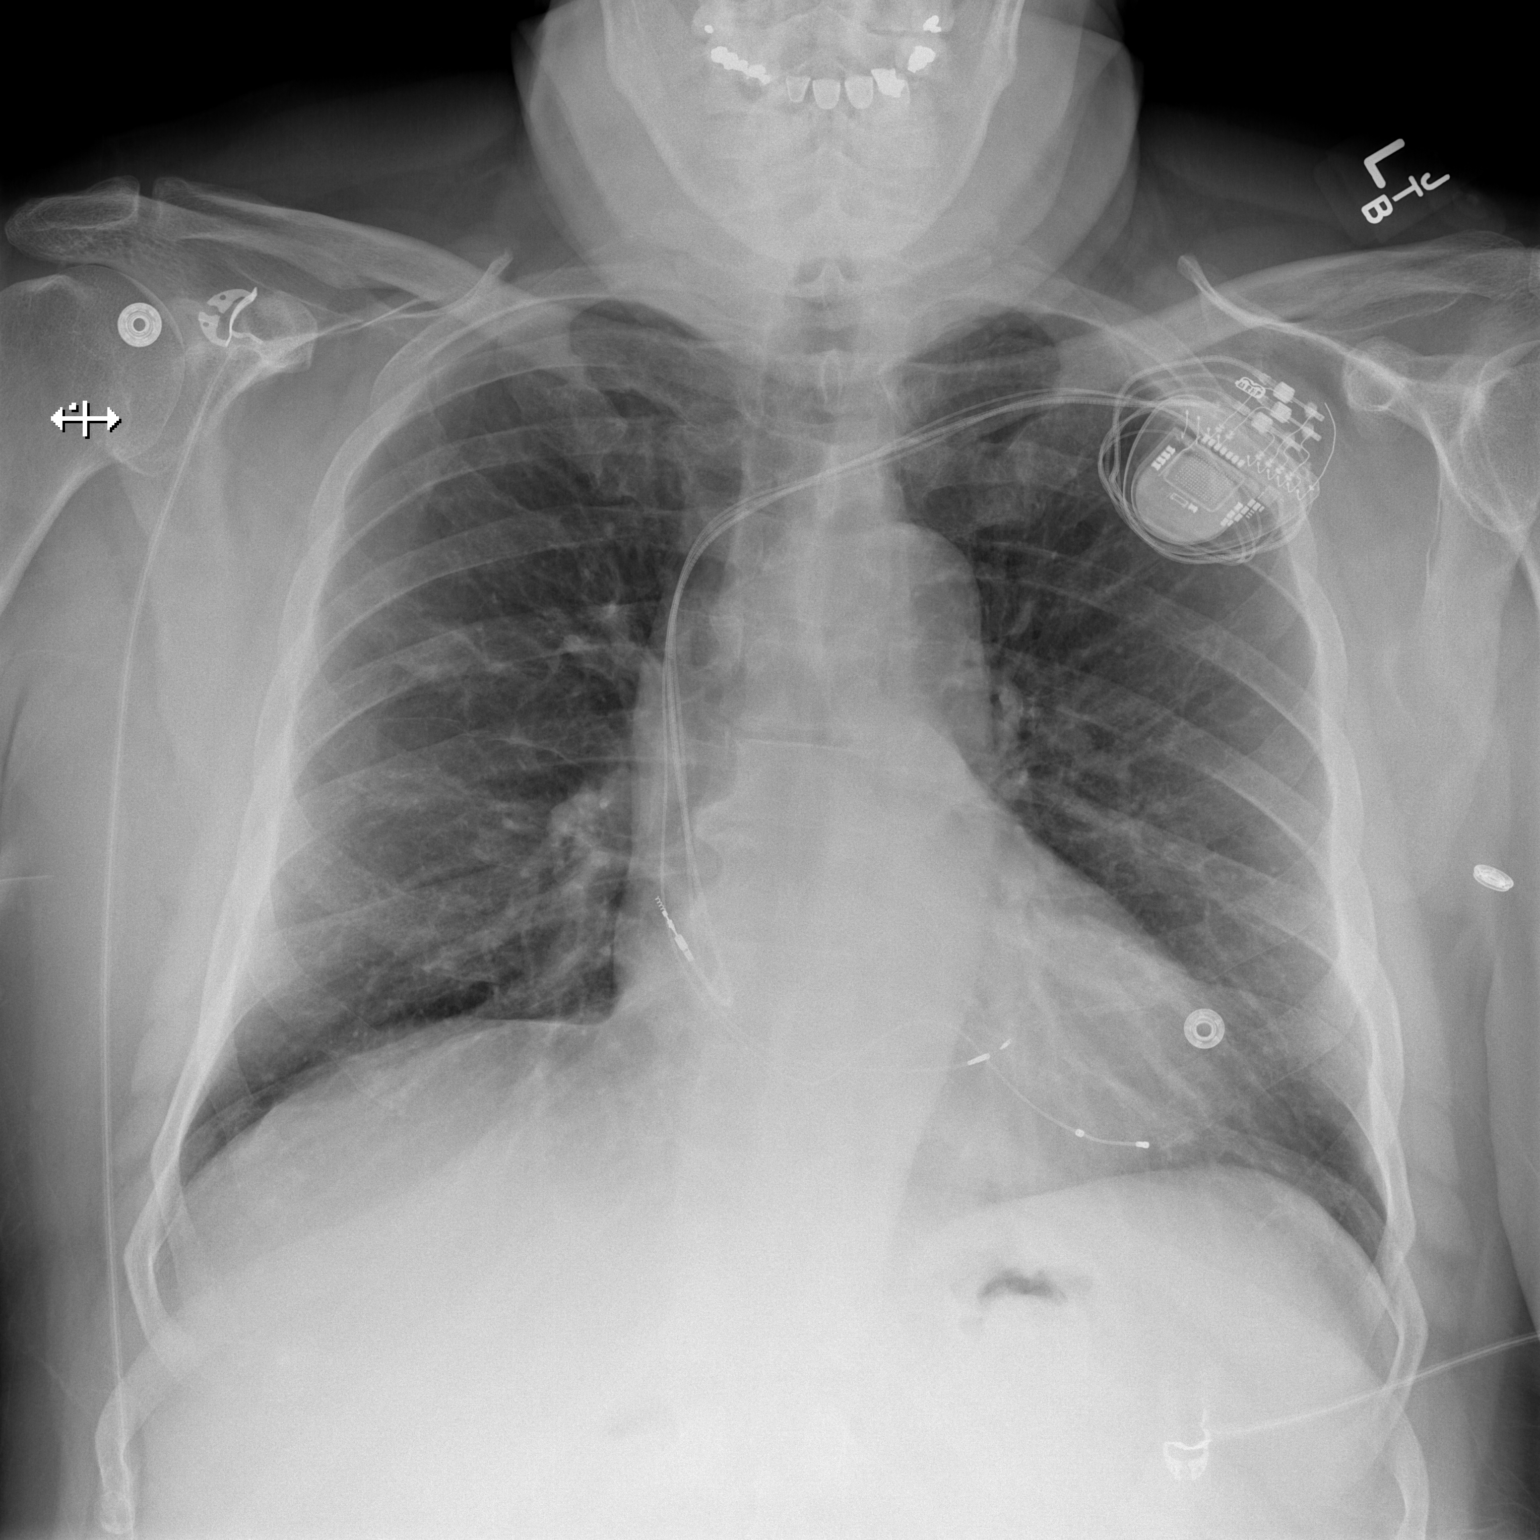

[w chest lat]
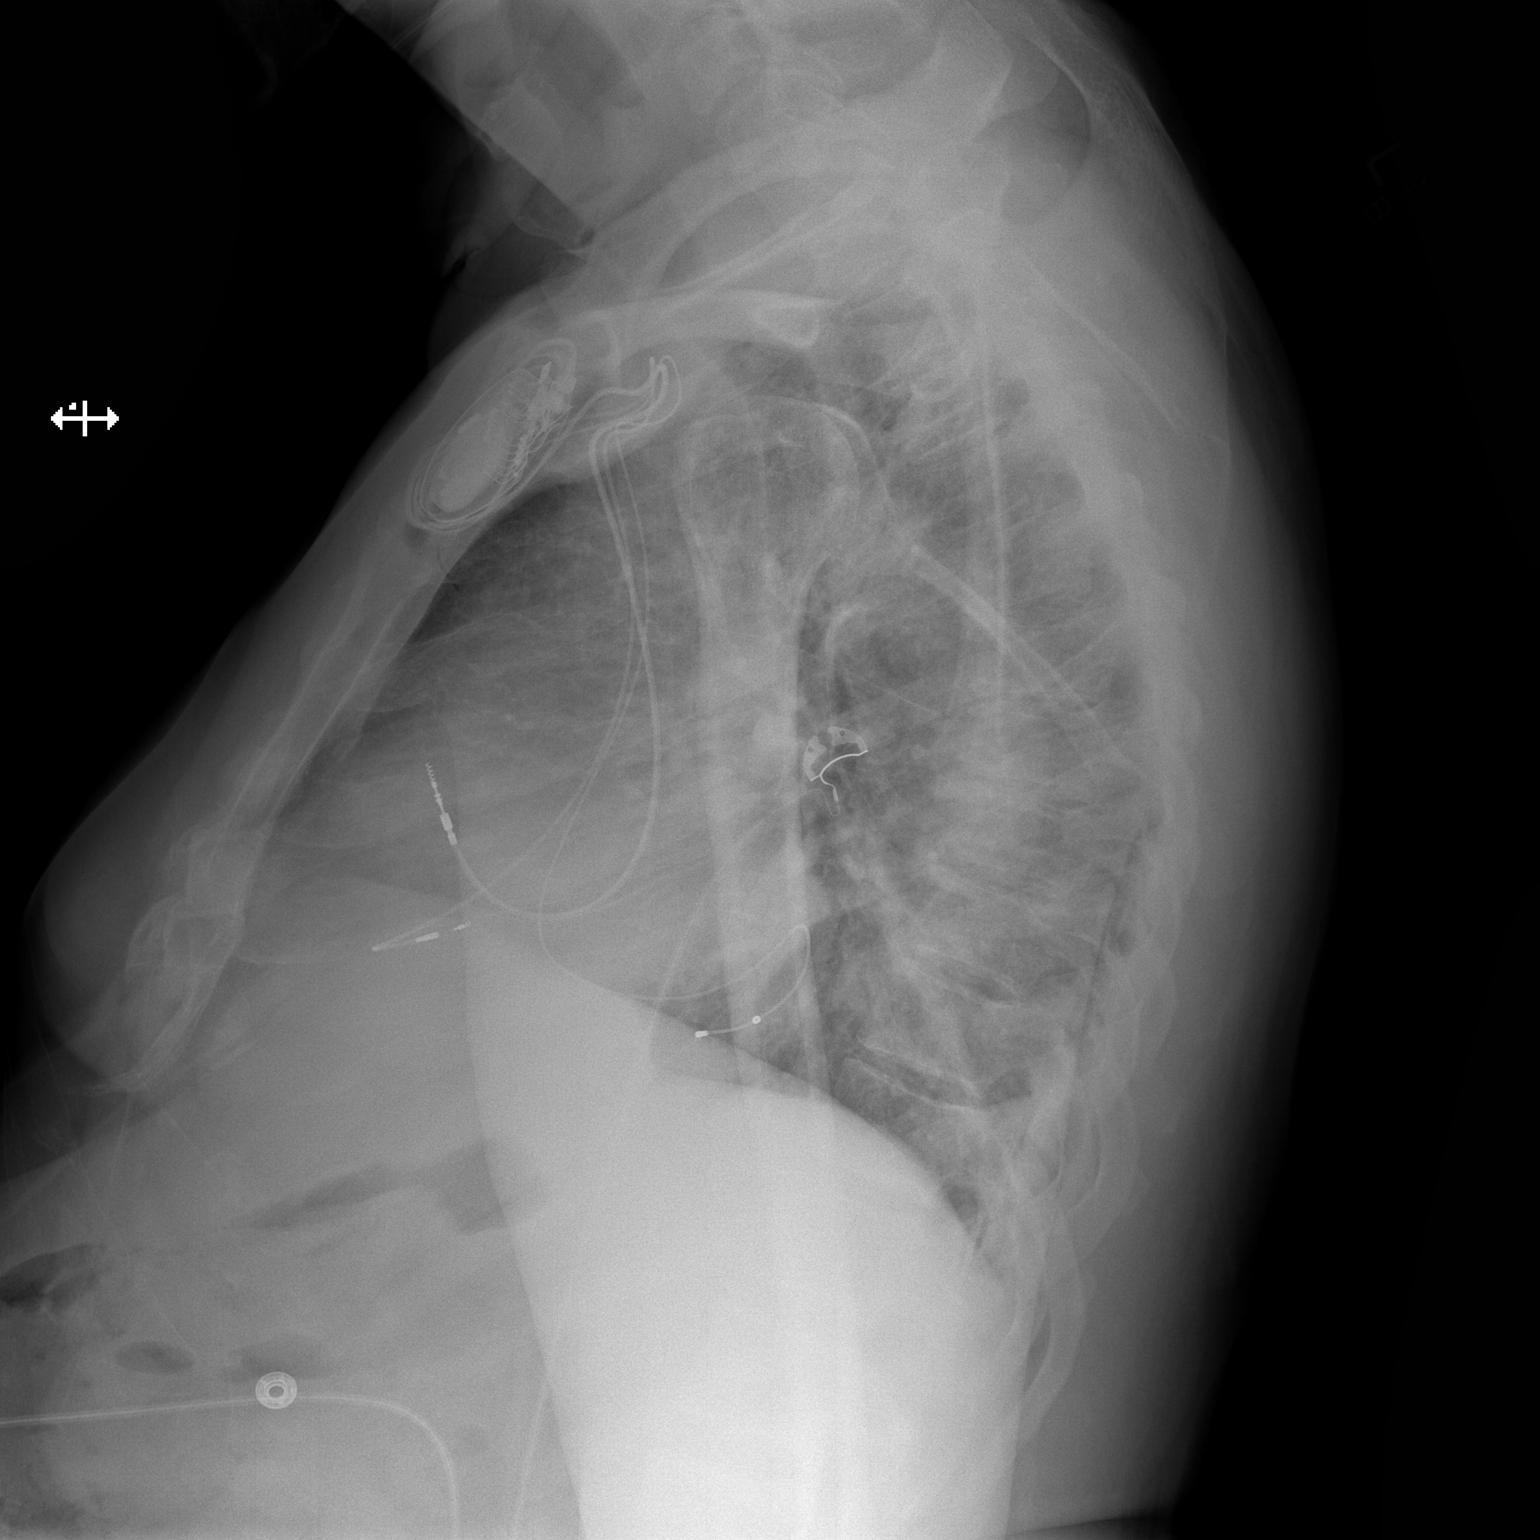

[2 of 2 positions shown; findings below may reference images not displayed]

FINDINGS: Frontal and lateral views of the chest demonstrate pacemaker
revision. Generator within the left anterior chest wall. Proximal
lead within the region of the right atrium, distal lead in the
region of the right ventricle, and third lead in the region of the
coronary sinus. Cardiac silhouette is unremarkable. No airspace
disease, effusion, or pneumothorax. No acute bony abnormality.
IMPRESSION: 1. No complication after pacemaker revision.

## 2022-11-07 LAB — CUP PACEART REMOTE DEVICE CHECK
Battery Remaining Longevity: 99 mo
Battery Voltage: 3 V
Brady Statistic AP VP Percent: 59.87 %
Brady Statistic AP VS Percent: 1.14 %
Brady Statistic AS VP Percent: 38.32 %
Brady Statistic AS VS Percent: 0.67 %
Brady Statistic RA Percent Paced: 60.99 %
Brady Statistic RV Percent Paced: 98.18 %
Date Time Interrogation Session: 20231228231858
Implantable Lead Connection Status: 753985
Implantable Lead Connection Status: 753985
Implantable Lead Connection Status: 753985
Implantable Lead Implant Date: 20210922
Implantable Lead Implant Date: 20211102
Implantable Lead Implant Date: 20211102
Implantable Lead Location: 753858
Implantable Lead Location: 753859
Implantable Lead Location: 753860
Implantable Lead Model: 3830
Implantable Lead Model: 4196
Implantable Lead Model: 5076
Implantable Pulse Generator Implant Date: 20210922
Lead Channel Impedance Value: 380 Ohm
Lead Channel Impedance Value: 380 Ohm
Lead Channel Impedance Value: 399 Ohm
Lead Channel Impedance Value: 475 Ohm
Lead Channel Impedance Value: 494 Ohm
Lead Channel Impedance Value: 608 Ohm
Lead Channel Impedance Value: 627 Ohm
Lead Channel Impedance Value: 760 Ohm
Lead Channel Impedance Value: 931 Ohm
Lead Channel Pacing Threshold Amplitude: 0.5 V
Lead Channel Pacing Threshold Amplitude: 1 V
Lead Channel Pacing Threshold Pulse Width: 0.4 ms
Lead Channel Pacing Threshold Pulse Width: 0.4 ms
Lead Channel Sensing Intrinsic Amplitude: 2.875 mV
Lead Channel Sensing Intrinsic Amplitude: 2.875 mV
Lead Channel Sensing Intrinsic Amplitude: 7.125 mV
Lead Channel Sensing Intrinsic Amplitude: 7.125 mV
Lead Channel Setting Pacing Amplitude: 1.5 V
Lead Channel Setting Pacing Amplitude: 2 V
Lead Channel Setting Pacing Amplitude: 2 V
Lead Channel Setting Pacing Pulse Width: 0.4 ms
Lead Channel Setting Pacing Pulse Width: 0.4 ms
Lead Channel Setting Sensing Sensitivity: 0.9 mV
Zone Setting Status: 755011

## 2022-11-13 ENCOUNTER — Other Ambulatory Visit: Payer: Self-pay | Admitting: Cardiology

## 2022-11-17 DIAGNOSIS — H35343 Macular cyst, hole, or pseudohole, bilateral: Secondary | ICD-10-CM | POA: Diagnosis not present

## 2022-11-17 DIAGNOSIS — Z961 Presence of intraocular lens: Secondary | ICD-10-CM | POA: Diagnosis not present

## 2022-11-17 DIAGNOSIS — H18003 Unspecified corneal deposit, bilateral: Secondary | ICD-10-CM | POA: Diagnosis not present

## 2022-11-17 DIAGNOSIS — H16223 Keratoconjunctivitis sicca, not specified as Sjogren's, bilateral: Secondary | ICD-10-CM | POA: Diagnosis not present

## 2022-11-19 ENCOUNTER — Telehealth: Payer: Self-pay | Admitting: Cardiology

## 2022-11-19 NOTE — Telephone Encounter (Signed)
*  STAT* If patient is at the pharmacy, call can be transferred to refill team.   1. Which medications need to be refilled? (please list name of each medication and dose if known)  sacubitril-valsartan (ENTRESTO) 49-51 MG  ENTRESTO 24-26 MG  EPLERENONE 25 mg digoxin (LANOXIN) 0.125 MG tablet    2. Which pharmacy/location (including street and city if local pharmacy) is medication to be sent to? [place], [place]   3. Do they need a 30 day or 90 day supply? 90 day  Patient is almost out of medication.

## 2022-11-20 ENCOUNTER — Other Ambulatory Visit: Payer: Self-pay

## 2022-11-20 DIAGNOSIS — I5043 Acute on chronic combined systolic (congestive) and diastolic (congestive) heart failure: Secondary | ICD-10-CM

## 2022-11-20 DIAGNOSIS — I5042 Chronic combined systolic (congestive) and diastolic (congestive) heart failure: Secondary | ICD-10-CM

## 2022-11-20 MED ORDER — ENTRESTO 24-26 MG PO TABS: ORAL_TABLET | ORAL | 3 refills | Status: DC

## 2022-11-20 MED ORDER — ENTRESTO 49-51 MG PO TABS: 1.0000 | ORAL_TABLET | Freq: Every day | ORAL | 3 refills | Status: DC

## 2022-11-20 MED ORDER — DIGOXIN 125 MCG PO TABS: 125.0000 ug | ORAL_TABLET | Freq: Every day | ORAL | 3 refills | Status: DC

## 2022-11-25 ENCOUNTER — Other Ambulatory Visit (HOSPITAL_COMMUNITY): Payer: Self-pay

## 2023-01-05 DIAGNOSIS — M545 Low back pain, unspecified: Secondary | ICD-10-CM | POA: Diagnosis not present

## 2023-01-12 DIAGNOSIS — M5416 Radiculopathy, lumbar region: Secondary | ICD-10-CM | POA: Diagnosis not present

## 2023-01-13 ENCOUNTER — Ambulatory Visit: Payer: Medicare Other | Admitting: Physician Assistant

## 2023-01-24 ENCOUNTER — Other Ambulatory Visit: Payer: Self-pay | Admitting: Internal Medicine

## 2023-01-24 ENCOUNTER — Other Ambulatory Visit: Payer: Self-pay | Admitting: Cardiology

## 2023-01-28 DIAGNOSIS — M5442 Lumbago with sciatica, left side: Secondary | ICD-10-CM | POA: Diagnosis not present

## 2023-01-30 DIAGNOSIS — M5442 Lumbago with sciatica, left side: Secondary | ICD-10-CM | POA: Diagnosis not present

## 2023-02-04 DIAGNOSIS — M5442 Lumbago with sciatica, left side: Secondary | ICD-10-CM | POA: Diagnosis not present

## 2023-02-06 ENCOUNTER — Ambulatory Visit (INDEPENDENT_AMBULATORY_CARE_PROVIDER_SITE_OTHER): Payer: Medicare Other

## 2023-02-06 DIAGNOSIS — M5442 Lumbago with sciatica, left side: Secondary | ICD-10-CM | POA: Diagnosis not present

## 2023-02-06 DIAGNOSIS — I420 Dilated cardiomyopathy: Secondary | ICD-10-CM | POA: Diagnosis not present

## 2023-02-06 LAB — CUP PACEART REMOTE DEVICE CHECK
Battery Remaining Longevity: 87 mo
Battery Voltage: 2.99 V
Brady Statistic AP VP Percent: 65.63 %
Brady Statistic AP VS Percent: 2.94 %
Brady Statistic AS VP Percent: 29.72 %
Brady Statistic AS VS Percent: 1.7 %
Brady Statistic RA Percent Paced: 68.52 %
Brady Statistic RV Percent Paced: 95.36 %
Date Time Interrogation Session: 20240328230312
Implantable Lead Connection Status: 753985
Implantable Lead Connection Status: 753985
Implantable Lead Connection Status: 753985
Implantable Lead Implant Date: 20210922
Implantable Lead Implant Date: 20211102
Implantable Lead Implant Date: 20211102
Implantable Lead Location: 753858
Implantable Lead Location: 753859
Implantable Lead Location: 753860
Implantable Lead Model: 3830
Implantable Lead Model: 4196
Implantable Lead Model: 5076
Implantable Pulse Generator Implant Date: 20210922
Lead Channel Impedance Value: 380 Ohm
Lead Channel Impedance Value: 380 Ohm
Lead Channel Impedance Value: 380 Ohm
Lead Channel Impedance Value: 437 Ohm
Lead Channel Impedance Value: 494 Ohm
Lead Channel Impedance Value: 589 Ohm
Lead Channel Impedance Value: 646 Ohm
Lead Channel Impedance Value: 741 Ohm
Lead Channel Impedance Value: 893 Ohm
Lead Channel Pacing Threshold Amplitude: 0.375 V
Lead Channel Pacing Threshold Amplitude: 1.25 V
Lead Channel Pacing Threshold Pulse Width: 0.4 ms
Lead Channel Pacing Threshold Pulse Width: 0.4 ms
Lead Channel Sensing Intrinsic Amplitude: 3 mV
Lead Channel Sensing Intrinsic Amplitude: 3 mV
Lead Channel Sensing Intrinsic Amplitude: 7.125 mV
Lead Channel Sensing Intrinsic Amplitude: 7.125 mV
Lead Channel Setting Pacing Amplitude: 1.5 V
Lead Channel Setting Pacing Amplitude: 2 V
Lead Channel Setting Pacing Amplitude: 2.5 V
Lead Channel Setting Pacing Pulse Width: 0.4 ms
Lead Channel Setting Pacing Pulse Width: 0.4 ms
Lead Channel Setting Sensing Sensitivity: 0.9 mV
Zone Setting Status: 755011

## 2023-02-09 DIAGNOSIS — M5442 Lumbago with sciatica, left side: Secondary | ICD-10-CM | POA: Diagnosis not present

## 2023-02-12 ENCOUNTER — Other Ambulatory Visit: Payer: Self-pay | Admitting: Cardiology

## 2023-02-12 DIAGNOSIS — M5442 Lumbago with sciatica, left side: Secondary | ICD-10-CM | POA: Diagnosis not present

## 2023-02-13 ENCOUNTER — Ambulatory Visit: Payer: Medicare Other | Attending: Physician Assistant | Admitting: Nurse Practitioner

## 2023-02-13 ENCOUNTER — Encounter: Payer: Self-pay | Admitting: Nurse Practitioner

## 2023-02-13 VITALS — BP 122/70 | HR 89 | Ht 69.0 in | Wt 230.0 lb

## 2023-02-13 DIAGNOSIS — E785 Hyperlipidemia, unspecified: Secondary | ICD-10-CM

## 2023-02-13 DIAGNOSIS — M79662 Pain in left lower leg: Secondary | ICD-10-CM | POA: Diagnosis not present

## 2023-02-13 DIAGNOSIS — M79661 Pain in right lower leg: Secondary | ICD-10-CM | POA: Diagnosis not present

## 2023-02-13 DIAGNOSIS — I4819 Other persistent atrial fibrillation: Secondary | ICD-10-CM

## 2023-02-13 DIAGNOSIS — I5042 Chronic combined systolic (congestive) and diastolic (congestive) heart failure: Secondary | ICD-10-CM | POA: Diagnosis not present

## 2023-02-13 DIAGNOSIS — I420 Dilated cardiomyopathy: Secondary | ICD-10-CM

## 2023-02-13 NOTE — Patient Instructions (Addendum)
Medication Instructions:  Your physician recommends that you continue on your current medications as directed. Please refer to the Current Medication list given to you today.   Lab Work: Your physician recommends that you complete fasting lab work today. Lipid panel, CBC, TSH, CMET    If you have labs (blood work) drawn today and your tests are completely normal, you will receive your results only by: MyChart Message (if you have MyChart) OR A paper copy in the mail If you have any lab test that is abnormal or we need to change your treatment, we will call you to review the results.   Testing/Procedures: Your physician has requested that you have an ankle brachial index (ABI). During this test an ultrasound and blood pressure cuff are used to evaluate the arteries that supply the arms and legs with blood. Allow thirty minutes for this exam. There are no restrictions or special instructions.     Follow-Up: At [facility], you and your health needs are our priority.  As part of our continuing mission to provide you with exceptional heart care, we have created designated Provider Care Teams.  These Care Teams include your primary Cardiologist (physician) and Advanced Practice Providers (APPs -  Physician Assistants and Nurse Practitioners) who all work together to provide you with the care you need, when you need it.  We recommend signing up for the patient portal called "MyChart".  Sign up information is provided on this After Visit Summary.  MyChart is used to connect with patients for Virtual Visits (Telemedicine).  Patients are able to view lab/test results, encounter notes, upcoming appointments, etc.  Non-urgent messages can be sent to your provider as well.   To learn more about what you can do with MyChart, go to [email or web address].    Your next appointment:   First Available   Provider:   Bryan Lemma, MD     Other Instructions

## 2023-02-13 NOTE — Progress Notes (Unsigned)
Office Visit    Patient Name: Randall NuttingMitchell L Sutphen Date of Encounter: 02/13/2023  Primary Care Provider:  Sheliah HatchBreedlove, Brent C, PA-C Primary Cardiologist:  Bryan Lemmaavid Harding, MD  Chief Complaint    72 year old male with a history of p persistent atrial fibrillation, chronic combined systolic and diastolic heart failure, NICM, tachycardia bradycardia syndrome, s/p CRT-P in 2021, LBBB, hyperlipidemia, venous insufficiency, and obesity who presents for follow-up related to heart failure and atrial fibrillation.  Past Medical History    Past Medical History:  Diagnosis Date   Asthma    as child   Atrial fibrillation, rapid 12/08/2017   Diagnosed during colonoscopy -presumably new onset   BBB (bundle branch block)    GERD (gastroesophageal reflux disease)    use meds prn   Glaucoma    Headache    remote h/o migraines, none in years   Hyperlipidemia    NICM (nonischemic cardiomyopathy) 04/2019   Echo 05/2019 - EF reduced to <20%.  Has permanent Afib & LBBB (normal Coronaries on Cath). -->  After cardioversion and titration medications, EF up to 35-40% with  global hypokinesis..   Thoracic aortic aneurysm 10/20/2019   Ascending thoracic aortic estimated 44 mm on echo   TIA (transient ischemic attack) 01/13/2018   Ventral hernia    Past Surgical History:  Procedure Laterality Date   BIV PACEMAKER INSERTION CRT-P N/A 08/01/2020   Procedure: BIV PACEMAKER INSERTION CRT-P;  Surgeon: Regan Lemmingamnitz, Will Martin, MD;  Location: [facility];  Service: Cardiovascular;  Laterality: N/A;   CARDIAC EVENT MONITOR  12/2016   showed persistent Afib -monitor return in early because of persistent A. fib.  Several episodes of severe RVR with rates in the 170s were noted.    CARDIOVERSION N/A 01/13/2018   Procedure: CARDIOVERSION;  Surgeon: Chrystie NoseHilty, Kenneth C, MD;  [facility];  Service: Cardiovascular;  Laterality: N/A; unsuccessful.-->  Post-cardioversion, the patient had left-sided facial numbness  and weakness.  Ruled out for stroke.   CARDIOVERSION N/A 08/19/2019   Procedure: CARDIOVERSION;  Surgeon: Laurey MoraleMcLean, Dalton S, MD;  [facility];  Service: Cardiovascular;  Laterality: N/A;   INSERTION OF MESH N/A 07/22/2017   Procedure: INSERTION OF MESH;  Surgeon: Emelia LoronWakefield, Matthew, MD;  [facility];  Service: General;  Laterality: N/A;  BILATERAL TAP BLOCK   LEAD INSERTION N/A 09/11/2020   Procedure: LEAD INSERTION - LV LEAD;  Surgeon: Marinus Mawaylor, Gregg W, MD;  Location: [facility];  Service: Cardiovascular;  Laterality: N/A;   NM MYOVIEW LTD  12/2017   Shows reduced EF, but no evidence of ischemia or infarction.  EF 40 and 45%.  INTERMEDIATE RISK due to reduced function.  EF notably better on echo (50 and 55%)   RIGHT/LEFT HEART CATH AND CORONARY ANGIOGRAPHY N/A 07/14/2019   Procedure: RIGHT/LEFT HEART CATH AND CORONARY ANGIOGRAPHY;  Surgeon: Marykay LexHarding, David W, MD;  [facility];; Angiographically normal coronary arteries. RHC  - PCWP & LVEDP 17 mmHg.  CO-CI 4.92-2.34   TEE WITHOUT CARDIOVERSION N/A 01/13/2018   Procedure: TRANSESOPHAGEAL ECHOCARDIOGRAM (TEE) with cardioversion - ;  Surgeon: Chrystie NoseHilty, Kenneth C, MD;  [facility];  Service: Cardiovascular;  Laterality: N/A;  Mild concentric LVH.  EF 55 to 60%.  No R WMA.  Mild ascending aortic dilation of 4.2 cm.  Dilated left atrium.  No thrombus.   TONSILLECTOMY     TRANSTHORACIC ECHOCARDIOGRAM  10/11/2019   EF up to 35-40%.-Moderate-severely decreased function.  "GR 1 DD ".  Normal atrial sizes.  Moderate ascending aorta-44 mL.  Relatively normal valves.   TRANSTHORACIC ECHOCARDIOGRAM  06/2019   a) 06/2019: Severely reduced  LV Fxn w/th diffuse  HK & and septal dyskinesis.  EF <20%.  Reduced RV fxn w/ mildly elevated RVP--37 mmHg.  Mod R & Mild L Atrial dilation.  Asc Ao 42 mm; b) 10/2021:  EF 40%.  GR 1 DD.  Mild LVH.  Unable to assess PAP.  AoV sclerosis w/o stenosis.  Nl RAP.  Mild Asc Ao dilation.    VENTRAL HERNIA REPAIR N/A 07/22/2017   Procedure: OPEN VENTRAL HERNIA REPAIR WITH MESH ERAS PATHWAY;  Surgeon: Emelia Loron, MD;  Location: [facility];  Service: General;  Laterality: N/A;  BILATERAL TAP BLOCK    Allergies  Allergies  Allergen Reactions   Crestor [Rosuvastatin] Other (See Comments)    myalgias   Statins Other (See Comments)    Muscle pains, mouth sores   Zetia [Ezetimibe] Other (See Comments)    Broke out in sores/ muscle pain   Iodine Swelling and Rash    This is with topical betadine only.  No issues, per patient, with CT contrast. 10/16/21 J. Lohr, RN   Latex Rash    Bandages not gloves   Lipitor [Atorvastatin] Other (See Comments)    Knee pain   Livalo [Pitavastatin] Other (See Comments)    Muscle pain   Spironolactone Other (See Comments)    Soreness   Vaseline [Petrolatum] Rash    Break out   Zocor [Simvastatin] Other (See Comments)    myalgias     Labs/Other Studies Reviewed    The following studies were reviewed today: R/LHC 07/2019:   Angiographically normal coronary arteries --------------------- There is severe left ventricular systolic dysfunction. The left ventricular ejection fraction is less than 25% by visual estimate by visual estimation Relatively compensated right heart cath pressures with PCWP and LVEDP of 17 mmHg. Cardiac Output-Index Hiram Comber) 4.92-2.34   SUMMARY Nonischemic cardiomyopathy with essentially normal coronary arteries Relatively compensated CHF with cardiac output 4.92, index 2.34; PCWP and LVEDP ~17 mmHg   RECOMMENDATIONS Discharge home after bed rest. With borderline blood pressures, no room to start ARB or Entresto at this point. We will initiate 40 mg daily Lasix with twice daily on Tuesdays and Thursdays (he will take 40 twice daily on Saturday, 07/16/2019) Restart Xarelto tonight 07/14/2019 Restart digoxin a.m. 07/15/2019 Patient will be referred to Dr. Loman Brooklyn (08/02/2019, 11 AM -[facility] [place]  office) -plan to discuss antiarrhythmic control of A. fib versus potential ablation given significant drop in EF over the last 18 months being in A. Fib.    Echo 10/2021:  IMPRESSIONS     1. Left ventricular ejection fraction, by estimation, is 40%. The left  ventricle has mild to moderately decreased function. The left ventricle  demonstrates global hypokinesis. There is mild left ventricular  hypertrophy. Left ventricular diastolic  parameters are consistent with Grade I diastolic dysfunction (impaired  relaxation).   2. Right ventricular systolic function is mildly reduced. The right  ventricular size is mildly enlarged. Tricuspid regurgitation signal is  inadequate for assessing PA pressure.   3. The mitral valve is normal in structure. No evidence of mitral valve  regurgitation. No evidence of mitral stenosis.   4. The aortic valve is tricuspid. Aortic valve regurgitation is not  visualized. Aortic valve sclerosis/calcification is present, without any  evidence of aortic stenosis.   5. Aortic dilatation noted. There is mild dilatation of the ascending  aorta, measuring 42 mm.   6. The inferior vena cava is normal in size with greater than 50%  respiratory variability, suggesting right atrial pressure of 3 mmHg.   Recent Labs: 06/09/2022: ALT 29 08/20/2022: BUN 9; Creatinine, Ser 1.05; NT-Pro BNP 71; Potassium 4.2; Sodium 142  Recent Lipid Panel    Component Value Date/Time   CHOL 141 06/09/2022 0921   TRIG 167 (H) 06/09/2022 0921   HDL 45 06/09/2022 0921   CHOLHDL 3.1 06/09/2022 0921   CHOLHDL 4.8 01/14/2018 0631   VLDL 19 01/14/2018 0631   LDLCALC 68 06/09/2022 0921    History of Present Illness    72 year old male with the above past medical history including persistent atrial fibrillation, chronic combined systolic and diastolic heart failure, NICM, tachycardia bradycardia syndrome, s/p CRT-P in 2021, LBBB, hyperlipidemia, venous insufficiency, and obesity.  He  has a history of chronic diastolic heart failure, prior EF as low as less than 20%.  R/LHC in 07/2019 showed normal coronary arteries, NICM.  Most recent echocardiogram in 10/2021 showed EF 40%, mild to moderately reduced LV systolic function, mild LVH, G1 DD mildly reduced RV systolic function.  He is status post [employer] CRT-P in 07/2020.  Follows with EP.  He has a history of PAF on amiodarone and Xarelto.  He has a history of gynecomastia on spironolactone, improved with transition to Eplerenone.  He was last seen by Dr. Elberta Fortis on 08/20/2022 and was stable overall from a cardiac standpoint.  He did note some continued heartburn symptoms, 3 to 4 pillow orthopnea. BNP was normal.   He presents today in follow-up. Since his last visit Wt Readings from Last 3 Encounters:  02/13/23 230 lb (104.3 kg)  08/20/22 248 lb 12.8 oz (112.9 kg)  07/02/22 241 lb (109.3 kg)   He has done well from a cardiac standpoint.  He notes improved shortness of breath, he has lost almost 20 pounds.  With dietary changes and increased activity.  He notes brief episodes of chest discomfort, not associated with activity.  He denies any PND, orthopnea, weight gain.  He does note intermittent burning/bilateral leg pain, suspicious for claudication.  He does have a history of venous insufficiency, however, I do not see where ABIs were checked.  Will check ABIs.  Will check fasting lipid panel, CMET, TSH, CBC.  CT of the chest showed stable 4.3 cm aneurysm of the ascending aorta, no concerning lung changes.  He has had his annual eye exam for amiodarone monitoring.  Follow-up in 6 to 7 months with Dr. Herbie Baltimore.  Concerned about the cost of his medications.  He has a $7000 deductible and his Xarelto and Sherryll Burger and Praluent are costing him thousand dollars every 3 months. Chronic combined systolic and diastolic heart failure/NICM: Most recent device interrogation 01/2023 was stable. Persistent atrial fibrillation: Maintaining NSR on exam.   Hyperlipidemia: Disposition:  Home Medications    Current Outpatient Medications  Medication Sig Dispense Refill   acetaminophen (TYLENOL) 500 MG tablet Take 500 mg by mouth every 6 (six) hours as needed for moderate pain.      Alirocumab (PRALUENT) 150 MG/ML SOAJ Inject 150 mg into the skin every 14 (fourteen) days. 6 mL 3   amiodarone (PACERONE) 200 MG tablet TAKE 1 TABLET BY MOUTH DAILY 100 tablet 2   carvedilol (COREG) 3.125 MG tablet TAKE 1 TABLET BY MOUTH TWICE  DAILY 200 tablet 2   Cholecalciferol (VITAMIN D) 50 MCG (2000 UT) tablet Take 2,000 Units by mouth daily.  digoxin (LANOXIN) 0.125 MG tablet Take 1 tablet (125 mcg total) by mouth daily. 90 tablet 3   DULoxetine (CYMBALTA) 30 MG capsule Take 30 mg by mouth daily.     ENTRESTO 24-26 MG TAKE 1 TABLET BY MOUTH EVERY DAY IN THE MORNING 90 tablet 3   EPLERENONE PO Take by mouth.     furosemide (LASIX) 80 MG tablet TAKE 1 TABLET BY MOUTH TWICE  DAILY, MAY TAKE 1 ADDITIONAL  TABLET DAILY AS NEEDED FOR  INCREASED SWELLING OR SHORTNESS  OF BREATH 135 tablet 0   gabapentin (NEURONTIN) 300 MG capsule 1 capsule     hydrocortisone (ANUSOL-HC) 25 MG suppository PLACE 1 SUPPOSITORY (25 MG TOTAL) RECTALLY AT BEDTIME. *NOT COVERED* 24 suppository 1   metolazone (ZAROXOLYN) 2.5 MG tablet TAKE 1 TABLET BY MOUTH EVERY MONDAY, WEDNESDAY, AND FRIDAY. 30 MINUTES PRIOR TO YOUR FUROSEMIDE DOSE 45 tablet 3   Multiple Vitamins-Minerals (ULTRA MENS PACK PO) Take 1 tablet by mouth daily.     mupirocin ointment (BACTROBAN) 2 % Apply 1 application topically daily.     Omega-3 Fatty Acids (FISH OIL) 1200 MG CAPS Take 1,200 mg by mouth every other day.     omeprazole (PRILOSEC) 20 MG capsule TAKE 1 CAPSULE BY MOUTH DAILY 100 capsule 3   potassium chloride SA (KLOR-CON M) 20 MEQ tablet TAKE 1 TABLET BY MOUTH DAILY 90 tablet 0   rivaroxaban (XARELTO) 20 MG TABS tablet Take 1 tablet (20 mg total) by mouth daily with supper. 90 tablet 2   rosuvastatin  (CRESTOR) 5 MG tablet TAKE 1 TABLET BY MOUTH DAILY 100 tablet 0   sacubitril-valsartan (ENTRESTO) 49-51 MG Take 1 tablet by mouth at bedtime. ,per Dr Herbie Baltimore  this is in additional dose than his morning dose. 90 tablet 3   SYMBICORT 160-4.5 MCG/ACT inhaler INHALE 2 PUFFS INTO THE LUNGS IN THE MORNING AND AT BEDTIME. 30.6 each 1   tizanidine (ZANAFLEX) 2 MG capsule Take 2 mg by mouth at bedtime.     triamcinolone cream (KENALOG) 0.1 % Apply 1 application topically as needed.     nitroGLYCERIN (NITROSTAT) 0.4 MG SL tablet Place 1 tablet (0.4 mg total) under the tongue every 5 (five) minutes as needed for chest pain. 25 tablet 3   No current facility-administered medications for this visit.     Review of Systems    ***.  All other systems reviewed and are otherwise negative except as noted above.    Physical Exam    VS:  BP 122/70   Pulse 89   Ht  (1.753 m)   Wt 230 lb (104.3 kg)   SpO2 95%   BMI 33.97 kg/m  GEN: Well nourished, well developed, in no acute distress. HEENT: normal. Neck: Supple, no JVD, carotid bruits, or masses. Cardiac: RRR, no murmurs, rubs, or gallops. No clubbing, cyanosis, edema.  Radials/DP/PT 2+ and equal bilaterally.  Respiratory:  Respirations regular and unlabored, clear to auscultation bilaterally. GI: Soft, nontender, nondistended, BS + x 4. MS: no deformity or atrophy. Skin: warm and dry, no rash. Neuro:  Strength and sensation are intact. Psych: Normal affect.  Accessory Clinical Findings    ECG personally reviewed by me today -no EKG in office today.    Lab Results  Component Value Date   WBC 7.4 03/05/2021   HGB 13.8 03/05/2021   HCT 41.4 03/05/2021   MCV 88.1 03/05/2021   PLT 194.0 03/05/2021   Lab Results  Component Value Date   CREATININE  1.05 08/20/2022   BUN 9 08/20/2022   NA 142 08/20/2022   K 4.2 08/20/2022   CL 103 08/20/2022   CO2 24 08/20/2022   Lab Results  Component Value Date   ALT 29 06/09/2022   AST 24  06/09/2022   ALKPHOS 66 06/09/2022   BILITOT 0.3 06/09/2022   Lab Results  Component Value Date   CHOL 141 06/09/2022   HDL 45 06/09/2022   LDLCALC 68 06/09/2022   TRIG 167 (H) 06/09/2022   CHOLHDL 3.1 06/09/2022    Lab Results  Component Value Date   HGBA1C 5.6 01/14/2018    Assessment & Plan    1.  ***      Joylene Grapes, NP 02/13/2023, 1:19 PM

## 2023-02-14 LAB — COMPREHENSIVE METABOLIC PANEL
ALT: 36 IU/L (ref 0–44)
AST: 31 IU/L (ref 0–40)
Albumin/Globulin Ratio: 2.1 (ref 1.2–2.2)
Albumin: 4.7 g/dL (ref 3.8–4.8)
Alkaline Phosphatase: 63 IU/L (ref 44–121)
BUN/Creatinine Ratio: 15 (ref 10–24)
BUN: 17 mg/dL (ref 8–27)
Bilirubin Total: 0.5 mg/dL (ref 0.0–1.2)
CO2: 25 mmol/L (ref 20–29)
Calcium: 9.4 mg/dL (ref 8.6–10.2)
Chloride: 99 mmol/L (ref 96–106)
Creatinine, Ser: 1.1 mg/dL (ref 0.76–1.27)
Globulin, Total: 2.2 g/dL (ref 1.5–4.5)
Glucose: 101 mg/dL — ABNORMAL HIGH (ref 70–99)
Potassium: 3.8 mmol/L (ref 3.5–5.2)
Sodium: 142 mmol/L (ref 134–144)
Total Protein: 6.9 g/dL (ref 6.0–8.5)
eGFR: 72 mL/min/{1.73_m2} (ref >59)

## 2023-02-14 LAB — LIPID PANEL
Chol/HDL Ratio: 3 ratio (ref 0.0–5.0)
Cholesterol, Total: 109 mg/dL (ref 100–199)
HDL: 36 mg/dL — ABNORMAL LOW (ref >39)
LDL Chol Calc (NIH): 52 mg/dL (ref 0–99)
Triglycerides: 115 mg/dL (ref 0–149)
VLDL Cholesterol Cal: 21 mg/dL (ref 5–40)

## 2023-02-14 LAB — CBC
Hematocrit: 44.3 % (ref 37.5–51.0)
Hemoglobin: 14.7 g/dL (ref 13.0–17.7)
MCH: 30.2 pg (ref 26.6–33.0)
MCHC: 33.2 g/dL (ref 31.5–35.7)
MCV: 91 fL (ref 79–97)
Platelets: 267 10*3/uL (ref 150–450)
RBC: 4.86 x10E6/uL (ref 4.14–5.80)
RDW: 13.1 % (ref 11.6–15.4)
WBC: 8.7 10*3/uL (ref 3.4–10.8)

## 2023-02-14 LAB — TSH: TSH: 1.32 u[IU]/mL (ref 0.450–4.500)

## 2023-02-15 ENCOUNTER — Encounter: Payer: Self-pay | Admitting: Nurse Practitioner

## 2023-02-18 DIAGNOSIS — M5442 Lumbago with sciatica, left side: Secondary | ICD-10-CM | POA: Diagnosis not present

## 2023-02-19 ENCOUNTER — Other Ambulatory Visit (HOSPITAL_COMMUNITY): Payer: Self-pay | Admitting: Nurse Practitioner

## 2023-02-19 DIAGNOSIS — I739 Peripheral vascular disease, unspecified: Secondary | ICD-10-CM

## 2023-02-20 DIAGNOSIS — M5442 Lumbago with sciatica, left side: Secondary | ICD-10-CM | POA: Diagnosis not present

## 2023-02-24 DIAGNOSIS — M5442 Lumbago with sciatica, left side: Secondary | ICD-10-CM | POA: Diagnosis not present

## 2023-02-26 DIAGNOSIS — M5442 Lumbago with sciatica, left side: Secondary | ICD-10-CM | POA: Diagnosis not present

## 2023-03-01 ENCOUNTER — Other Ambulatory Visit: Payer: Self-pay | Admitting: Cardiology

## 2023-03-03 DIAGNOSIS — M5442 Lumbago with sciatica, left side: Secondary | ICD-10-CM | POA: Diagnosis not present

## 2023-03-06 DIAGNOSIS — I8312 Varicose veins of left lower extremity with inflammation: Secondary | ICD-10-CM | POA: Diagnosis not present

## 2023-03-06 DIAGNOSIS — M5442 Lumbago with sciatica, left side: Secondary | ICD-10-CM | POA: Diagnosis not present

## 2023-03-06 DIAGNOSIS — Z85828 Personal history of other malignant neoplasm of skin: Secondary | ICD-10-CM | POA: Diagnosis not present

## 2023-03-06 DIAGNOSIS — L820 Inflamed seborrheic keratosis: Secondary | ICD-10-CM | POA: Diagnosis not present

## 2023-03-06 DIAGNOSIS — L578 Other skin changes due to chronic exposure to nonionizing radiation: Secondary | ICD-10-CM | POA: Diagnosis not present

## 2023-03-06 DIAGNOSIS — I872 Venous insufficiency (chronic) (peripheral): Secondary | ICD-10-CM | POA: Diagnosis not present

## 2023-03-06 DIAGNOSIS — L720 Epidermal cyst: Secondary | ICD-10-CM | POA: Diagnosis not present

## 2023-03-06 DIAGNOSIS — L918 Other hypertrophic disorders of the skin: Secondary | ICD-10-CM | POA: Diagnosis not present

## 2023-03-06 DIAGNOSIS — I8311 Varicose veins of right lower extremity with inflammation: Secondary | ICD-10-CM | POA: Diagnosis not present

## 2023-03-06 DIAGNOSIS — L570 Actinic keratosis: Secondary | ICD-10-CM | POA: Diagnosis not present

## 2023-03-06 DIAGNOSIS — D225 Melanocytic nevi of trunk: Secondary | ICD-10-CM | POA: Diagnosis not present

## 2023-03-10 ENCOUNTER — Telehealth: Payer: Self-pay

## 2023-03-10 ENCOUNTER — Other Ambulatory Visit: Payer: Self-pay | Admitting: *Deleted

## 2023-03-10 ENCOUNTER — Ambulatory Visit (HOSPITAL_COMMUNITY)
Admission: RE | Admit: 2023-03-10 | Discharge: 2023-03-10 | Disposition: A | Discharge status: Home / Self Care | Payer: Medicare Other | Source: Ambulatory Visit | Attending: Nurse Practitioner | Admitting: Nurse Practitioner

## 2023-03-10 ENCOUNTER — Other Ambulatory Visit: Payer: Self-pay

## 2023-03-10 DIAGNOSIS — M79661 Pain in right lower leg: Secondary | ICD-10-CM | POA: Diagnosis not present

## 2023-03-10 DIAGNOSIS — I739 Peripheral vascular disease, unspecified: Secondary | ICD-10-CM

## 2023-03-10 DIAGNOSIS — M79662 Pain in left lower leg: Secondary | ICD-10-CM | POA: Insufficient documentation

## 2023-03-10 DIAGNOSIS — I4819 Other persistent atrial fibrillation: Secondary | ICD-10-CM

## 2023-03-10 LAB — VAS US ABI WITH/WO TBI
Left ABI: 1.19
Right ABI: 1.28

## 2023-03-10 MED ORDER — RIVAROXABAN 20 MG PO TABS
20.0000 mg | ORAL_TABLET | Freq: Every day | ORAL | 1 refills | Status: DC
Start: 2023-03-10 — End: 2024-03-17

## 2023-03-10 MED ORDER — RIVAROXABAN 20 MG PO TABS: 20.0000 mg | ORAL_TABLET | Freq: Every day | ORAL | 0 refills | Status: DC

## 2023-03-10 NOTE — Progress Notes (Signed)
Remote pacemaker transmission.   

## 2023-03-10 NOTE — Telephone Encounter (Signed)
Received verbal refill request from Triage. Xarelto 20mg  refill requested. Pt is 72 years old, weight-104.3kg, Crea-1.10 on 02/13/23, last seen by Bernadene Person, NP on 02/13/23, Diagnosis-Afib, CrCl-90.87 mL/min; Dose is appropriate based on dosing criteria. Will send in refill to requested pharmacy.     Spoke with pt to clarify where to send refill and [facility] confirmed [facility] [place].

## 2023-03-10 NOTE — Telephone Encounter (Signed)
Pt was in office for vascular study. Pt alerted front staff he was having some numbness and staff witnessed pt drop something while attempting to pick it up. Front staff notified DOD, Dr. Allyson Sabal, about the symptoms. Pt went back to vascular study. Vascular staff, Carlos American, came to triage to request a refill for pt's Xarelto. Pt told her he has been out of this medication for several days. Alecia told this RN when she went to sit this pt up from his vascular study he stated he was dizzy and had a blank stare on his face. She had to shake his shoulder and he "come to" he stated "sorry I was out for a second." Pt's chart and symptoms reviewed by Dr. Allyson Sabal and Dr. Herbie Baltimore. They recommend pt go to the ED to be evaluated. I spoke with this pt in the vascular room, while Alecia was present pt made aware Dr. Allyson Sabal and Dr. Herbie Baltimore would like him to go to the ED to be evaluated. He states "I'm okay. I don't want to go to the ED. I'm okay, I promise. I get numbness all the time." Pt was in a relaxed sitting position while talking to me. I did observe pt putting on his socks and shoes without difficultly after sitting upright. I spoke with pt's wife, to let her know what was going on with her husband and Dr. Allyson Sabal and Dr. Elissa Hefty recommendations. She verbalized understanding. Pt states "that Xarelto is so expensive." Pt states mail order is $400+, [facility]. Pt given samples for Xarelto and patient assistance forms to help reduce the price of his medications. Pt took Xarelto while sitting in the office. Pt states he will go by his PCP after leaving this office to set up and appt and let them know what happened today. Pt was very thankful for the attention, and care given in the office today.   4 bottles of samples given: Lot: [id number] Exp: 10/25

## 2023-03-11 DIAGNOSIS — M5442 Lumbago with sciatica, left side: Secondary | ICD-10-CM | POA: Diagnosis not present

## 2023-03-12 ENCOUNTER — Telehealth: Payer: Self-pay

## 2023-03-12 ENCOUNTER — Other Ambulatory Visit (HOSPITAL_COMMUNITY): Payer: Self-pay | Admitting: Anesthesiology

## 2023-03-12 DIAGNOSIS — M5416 Radiculopathy, lumbar region: Secondary | ICD-10-CM | POA: Diagnosis not present

## 2023-03-12 DIAGNOSIS — M545 Low back pain, unspecified: Secondary | ICD-10-CM

## 2023-03-12 NOTE — Telephone Encounter (Signed)
Attempted to reach pt to discuss VAS [place] ABI results. No VM picked up, phone continued to ring. Will call pt back.

## 2023-03-13 DIAGNOSIS — H9392 Unspecified disorder of left ear: Secondary | ICD-10-CM | POA: Diagnosis not present

## 2023-03-13 DIAGNOSIS — D6869 Other thrombophilia: Secondary | ICD-10-CM | POA: Diagnosis not present

## 2023-03-13 DIAGNOSIS — I420 Dilated cardiomyopathy: Secondary | ICD-10-CM | POA: Diagnosis not present

## 2023-03-13 DIAGNOSIS — R42 Dizziness and giddiness: Secondary | ICD-10-CM | POA: Diagnosis not present

## 2023-03-13 DIAGNOSIS — I480 Paroxysmal atrial fibrillation: Secondary | ICD-10-CM | POA: Diagnosis not present

## 2023-03-13 DIAGNOSIS — I7121 Aneurysm of the ascending aorta, without rupture: Secondary | ICD-10-CM | POA: Diagnosis not present

## 2023-03-16 DIAGNOSIS — M5442 Lumbago with sciatica, left side: Secondary | ICD-10-CM | POA: Diagnosis not present

## 2023-03-17 ENCOUNTER — Telehealth: Payer: Self-pay

## 2023-03-17 NOTE — Telephone Encounter (Signed)
Spoke with pt. Pt was notified of ABI [place] results. Pt will continue current medication and f/u as planned.

## 2023-03-25 DIAGNOSIS — M5442 Lumbago with sciatica, left side: Secondary | ICD-10-CM | POA: Diagnosis not present

## 2023-03-30 DIAGNOSIS — M5442 Lumbago with sciatica, left side: Secondary | ICD-10-CM | POA: Diagnosis not present

## 2023-03-31 DIAGNOSIS — H9392 Unspecified disorder of left ear: Secondary | ICD-10-CM | POA: Diagnosis not present

## 2023-03-31 DIAGNOSIS — R2689 Other abnormalities of gait and mobility: Secondary | ICD-10-CM | POA: Diagnosis not present

## 2023-03-31 DIAGNOSIS — R42 Dizziness and giddiness: Secondary | ICD-10-CM | POA: Diagnosis not present

## 2023-03-31 DIAGNOSIS — I509 Heart failure, unspecified: Secondary | ICD-10-CM | POA: Diagnosis not present

## 2023-04-03 DIAGNOSIS — M5442 Lumbago with sciatica, left side: Secondary | ICD-10-CM | POA: Diagnosis not present

## 2023-04-07 DIAGNOSIS — M5442 Lumbago with sciatica, left side: Secondary | ICD-10-CM | POA: Diagnosis not present

## 2023-04-08 DIAGNOSIS — R42 Dizziness and giddiness: Secondary | ICD-10-CM | POA: Diagnosis not present

## 2023-04-09 DIAGNOSIS — M5442 Lumbago with sciatica, left side: Secondary | ICD-10-CM | POA: Diagnosis not present

## 2023-04-14 ENCOUNTER — Other Ambulatory Visit (HOSPITAL_COMMUNITY): Payer: Self-pay | Admitting: Otolaryngology

## 2023-04-14 DIAGNOSIS — I509 Heart failure, unspecified: Secondary | ICD-10-CM

## 2023-04-16 ENCOUNTER — Other Ambulatory Visit: Payer: Self-pay | Admitting: Cardiology

## 2023-04-16 DIAGNOSIS — M5442 Lumbago with sciatica, left side: Secondary | ICD-10-CM | POA: Diagnosis not present

## 2023-04-17 DIAGNOSIS — R42 Dizziness and giddiness: Secondary | ICD-10-CM | POA: Diagnosis not present

## 2023-04-20 ENCOUNTER — Ambulatory Visit (HOSPITAL_COMMUNITY)
Admission: RE | Admit: 2023-04-20 | Discharge: 2023-04-20 | Disposition: A | Discharge status: Home / Self Care | Payer: Medicare Other | Source: Ambulatory Visit | Attending: Otolaryngology | Admitting: Otolaryngology

## 2023-04-20 DIAGNOSIS — I509 Heart failure, unspecified: Secondary | ICD-10-CM | POA: Insufficient documentation

## 2023-04-20 DIAGNOSIS — H8110 Benign paroxysmal vertigo, unspecified ear: Secondary | ICD-10-CM | POA: Diagnosis not present

## 2023-04-23 ENCOUNTER — Other Ambulatory Visit: Payer: Self-pay | Admitting: Cardiology

## 2023-05-06 DIAGNOSIS — R42 Dizziness and giddiness: Secondary | ICD-10-CM | POA: Diagnosis not present

## 2023-05-06 DIAGNOSIS — I5042 Chronic combined systolic (congestive) and diastolic (congestive) heart failure: Secondary | ICD-10-CM | POA: Diagnosis not present

## 2023-05-06 DIAGNOSIS — Z950 Presence of cardiac pacemaker: Secondary | ICD-10-CM | POA: Diagnosis not present

## 2023-05-08 ENCOUNTER — Ambulatory Visit (INDEPENDENT_AMBULATORY_CARE_PROVIDER_SITE_OTHER): Payer: Medicare Other

## 2023-05-08 DIAGNOSIS — I420 Dilated cardiomyopathy: Secondary | ICD-10-CM | POA: Diagnosis not present

## 2023-05-08 LAB — CUP PACEART REMOTE DEVICE CHECK
Battery Remaining Longevity: 86 mo
Battery Voltage: 2.99 V
Brady Statistic AP VP Percent: 74.28 %
Brady Statistic AP VS Percent: 1.42 %
Brady Statistic AS VP Percent: 23.68 %
Brady Statistic AS VS Percent: 0.62 %
Brady Statistic RA Percent Paced: 75.66 %
Brady Statistic RV Percent Paced: 97.96 %
Date Time Interrogation Session: 20240627213755
Implantable Lead Connection Status: 753985
Implantable Lead Connection Status: 753985
Implantable Lead Connection Status: 753985
Implantable Lead Implant Date: 20210922
Implantable Lead Implant Date: 20211102
Implantable Lead Implant Date: 20211102
Implantable Lead Location: 753858
Implantable Lead Location: 753859
Implantable Lead Location: 753860
Implantable Lead Model: 3830
Implantable Lead Model: 4196
Implantable Lead Model: 5076
Implantable Pulse Generator Implant Date: 20210922
Lead Channel Impedance Value: 342 Ohm
Lead Channel Impedance Value: 342 Ohm
Lead Channel Impedance Value: 380 Ohm
Lead Channel Impedance Value: 456 Ohm
Lead Channel Impedance Value: 475 Ohm
Lead Channel Impedance Value: 570 Ohm
Lead Channel Impedance Value: 608 Ohm
Lead Channel Impedance Value: 741 Ohm
Lead Channel Impedance Value: 893 Ohm
Lead Channel Pacing Threshold Amplitude: 0.5 V
Lead Channel Pacing Threshold Amplitude: 1.125 V
Lead Channel Pacing Threshold Pulse Width: 0.4 ms
Lead Channel Pacing Threshold Pulse Width: 0.4 ms
Lead Channel Sensing Intrinsic Amplitude: 2.875 mV
Lead Channel Sensing Intrinsic Amplitude: 2.875 mV
Lead Channel Sensing Intrinsic Amplitude: 7.125 mV
Lead Channel Sensing Intrinsic Amplitude: 7.125 mV
Lead Channel Setting Pacing Amplitude: 1.5 V
Lead Channel Setting Pacing Amplitude: 2 V
Lead Channel Setting Pacing Amplitude: 2.25 V
Lead Channel Setting Pacing Pulse Width: 0.4 ms
Lead Channel Setting Pacing Pulse Width: 0.4 ms
Lead Channel Setting Sensing Sensitivity: 0.9 mV
Zone Setting Status: 755011

## 2023-05-13 ENCOUNTER — Ambulatory Visit (HOSPITAL_COMMUNITY)
Admission: RE | Admit: 2023-05-13 | Discharge: 2023-05-13 | Disposition: A | Discharge status: Home / Self Care | Payer: Medicare Other | Source: Ambulatory Visit | Attending: Anesthesiology | Admitting: Anesthesiology

## 2023-05-13 DIAGNOSIS — M47816 Spondylosis without myelopathy or radiculopathy, lumbar region: Secondary | ICD-10-CM | POA: Diagnosis not present

## 2023-05-13 DIAGNOSIS — M545 Low back pain, unspecified: Secondary | ICD-10-CM | POA: Diagnosis not present

## 2023-05-13 DIAGNOSIS — M48061 Spinal stenosis, lumbar region without neurogenic claudication: Secondary | ICD-10-CM | POA: Diagnosis not present

## 2023-05-13 DIAGNOSIS — M5137 Other intervertebral disc degeneration, lumbosacral region: Secondary | ICD-10-CM | POA: Diagnosis not present

## 2023-05-13 NOTE — Progress Notes (Signed)
Per order,  Changed device settings for MRI to DOO at 95 bpm MRI mode Will program device back to pre-MRI settings after completion of exam, and send transmission.         

## 2023-05-21 NOTE — Progress Notes (Signed)
Remote pacemaker transmission.   

## 2023-05-27 ENCOUNTER — Encounter: Payer: Self-pay | Admitting: *Deleted

## 2023-05-28 ENCOUNTER — Ambulatory Visit: Payer: Medicare Other | Admitting: Neurology

## 2023-05-28 ENCOUNTER — Telehealth: Payer: Self-pay | Admitting: Neurology

## 2023-05-28 ENCOUNTER — Encounter: Payer: Self-pay | Admitting: Neurology

## 2023-05-28 VITALS — Ht 69.0 in | Wt 228.0 lb

## 2023-05-28 DIAGNOSIS — R2689 Other abnormalities of gait and mobility: Secondary | ICD-10-CM

## 2023-05-28 DIAGNOSIS — R42 Dizziness and giddiness: Secondary | ICD-10-CM | POA: Diagnosis not present

## 2023-05-28 DIAGNOSIS — Z9189 Other specified personal risk factors, not elsewhere classified: Secondary | ICD-10-CM | POA: Diagnosis not present

## 2023-05-28 DIAGNOSIS — Z8489 Family history of other specified conditions: Secondary | ICD-10-CM | POA: Diagnosis not present

## 2023-05-28 DIAGNOSIS — Z950 Presence of cardiac pacemaker: Secondary | ICD-10-CM | POA: Diagnosis not present

## 2023-05-28 NOTE — Telephone Encounter (Signed)
[employer] medicare NPR sent to [employer] [phone]

## 2023-05-28 NOTE — Progress Notes (Signed)
Subjective:    Patient ID: Randall Best is a 72 y.o. male.  HPI    Randall Foley, MD, PhD [name] [address], [address] [address] [name], [place] [address]  Dear Randall Best,  I saw your patient, Randall Best, upon your kind request in my neurologic clinic today for initial consultation of his dizziness. The patient is unaccompanied today. As you know, Randall Best is a 72 year old male with an underlying complex medical history of atrial fibrillation, nonischemic cardiomyopathy, status post pacemaker placement, congestive heart failure, TIA, atrial fibrillation on Eliquis, asthma, reflux disease, hyperlipidemia, neck mass, cataracts, skin cancers, and obesity, who reports an approximately 1 to 22-month history of intermittent dizziness, spells are fleeting, could be seconds at a time, feels like there is a sudden spinning sensation, feels like it starts on the left side, also associated with left-sided tinnitus.  He has not had any significant hearing loss.  He has not recently fallen but walks with a cane.  He takes Ativan at night and meclizine as needed on a day-to-day basis currently.  He has not driven a car in the past month, he reports that his wife drives.  He admits that he does not hydrate well with water, estimates that he drinks only 1 cup of water per day on average.  He feels that he sleeps well.  He had a sleep study 2 or 3 years ago and reports that testing was in the lab at [name], he was not diagnosed with sleep apnea and has not been on CPAP therapy.  He was on gabapentin for nerve pain which was started about 3 months ago but he stopped it about 2 weeks ago and feels that his dizziness has improved a little bit.  He reports a family history of vertigo affecting his mom, sister, niece, and daughter.    I reviewed your office note from 05/06/2023.  He has been on meclizine and Ativan as needed.  He was referred to physical therapy for  vestibular rehab recently.  He had been evaluated by ENT in May 2024, I reviewed the office visit note with Randall Best from 03/31/2023.  He reported vertiginous symptoms at the time lateralizing to the left.  He was advised to proceed with a carotid Doppler ultrasound.  He had a carotid arterial duplex study on 04/20/2023 which showed no significant ICA stenosis on either side, bilateral vertebral arteries showed antegrade flow, normal flow hemodynamics were seen in bilateral subclavian arteries.  A Dix Hallpike test was considered by ENT as a neck step but has not been pursued yet.  He has been seeing Randall Best for the past couple years for a left-sided neck mass, which thankfully turned out to be noncancerous.  Facial cellulitis was suspected.  He had a CT of the soft tissue neck with contrast on 11/06/2021 and I reviewed the results: IMPRESSION: 1. Focal area of skin thickening adjacent to the area marked measures at least 13 x 17 mm and extends up to 6 mm deep. This is nonspecific, but could represent focal cellulitis. Skin neoplasm is not excluded. 2. No additional lesions. 3. No significant cervical adenopathy. 4. Mild degenerative changes of the cervical spine at C6-7. Of note, he is on multiple medications including potentially sedating medications.  He is on duloxetine 30 mg daily, gabapentin 300 mg twice daily, tizanidine 2 mg at bedtime as needed.  He is currently on meclizine 25 mg as needed up to 3 times a day  and lorazepam 0.5 mg strength once daily as needed for severe dizziness.  He is also on amiodarone.  He is on Xarelto.  Full list of medications as below.  He had a brain MRI without contrast on 01/13/2018 for concern for TIA.  I reviewed the results: Impression: Unremarkable exam for age.  Negative for infarct.  He had a head CT without contrast on 01/13/2018 with indication of code stroke, I reviewed the results: Impression: Normal for age noncontrast CT appearance of the brain.  He had a CT  angiogram of the head and neck with and without contrast on 01/13/2018 and I reviewed the results:  IMPRESSION: 1. No emergent large vessel occlusion. 2. No atherosclerosis or stenosis in the neck. 3. Moderate right P2 segment narrowing, nonspecific in the absence of other atherosclerotic manifestations.   He was referred to physical therapy in May 2024.  He had lab work through your office but recent blood test results are not available for my review from your office.  I was able to review some blood test results in his electronic chart, TSH on 02/13/2023 was normal at 1.32.  CBC without differential was unremarkable, CMP benign, lipid panel benign.  Of note, he is also on digoxin. Blood test results included in the referral were from September 2022.    His Past Medical History Is Significant For: Past Medical History:  Diagnosis Date   Asthma    as child   Atrial fibrillation, rapid (HCC) 12/08/2017   Diagnosed during colonoscopy -presumably new onset   BBB (bundle branch block)    GERD (gastroesophageal reflux disease)    use meds prn   Glaucoma    Headache    remote h/o migraines, none in years   Hyperlipidemia    NICM (nonischemic cardiomyopathy) (HCC) 04/2019   Echo 05/2019 - EF reduced to <20%.  Has permanent Afib & LBBB (normal Coronaries on Cath). -->  After cardioversion and titration medications, EF up to 35-40% with  global hypokinesis..   Ringing in ears    bilateral   Skin lesion    "pre skin cancer"   Thoracic aortic aneurysm (HCC) 10/20/2019   Ascending thoracic aortic estimated 44 mm on echo   TIA (transient ischemic attack) 01/13/2018   Ventral hernia     His Past Surgical History Is Significant For: Past Surgical History:  Procedure Laterality Date   BIV PACEMAKER INSERTION CRT-P N/A 08/01/2020   Procedure: BIV PACEMAKER INSERTION CRT-P;  Surgeon: Regan Lemming, MD;  Location: [facility] INVASIVE CV LAB;  Service: Cardiovascular;  Laterality: N/A;   bump removed  from Rt ear  2023   "slow moving cancer"   CARDIAC EVENT MONITOR  12/2016   showed persistent Afib -monitor return in early because of persistent A. fib.  Several episodes of severe RVR with rates in the 170s were noted.    CARDIOVERSION N/A 01/13/2018   Procedure: CARDIOVERSION;  Surgeon: Chrystie Nose, MD;  Location: [facility] ENDOSCOPY;  Service: Cardiovascular;  Laterality: N/A; unsuccessful.-->  Post-cardioversion, the patient had left-sided facial numbness and weakness.  Ruled out for stroke.   CARDIOVERSION N/A 08/19/2019   Procedure: CARDIOVERSION;  Surgeon: Laurey Morale, MD;  Location: [facility] ENDOSCOPY;  Service: Cardiovascular;  Laterality: N/A;   INSERTION OF MESH N/A 07/22/2017   Procedure: INSERTION OF MESH;  Surgeon: Emelia Loron, MD;  Location: [facility] OR;  Service: General;  Laterality: N/A;  BILATERAL TAP BLOCK   LEAD INSERTION N/A 09/11/2020   Procedure: LEAD INSERTION -  LV LEAD;  Surgeon: Marinus Maw, MD;  Location: [facility] INVASIVE CV LAB;  Service: Cardiovascular;  Laterality: N/A;   MOLE REMOVAL     2; hx of pre skin cancer   NM MYOVIEW LTD  12/2017   Shows reduced EF, but no evidence of ischemia or infarction.  EF 40 and 45%.  INTERMEDIATE RISK due to reduced function.  EF notably better on echo (50 and 55%)   RIGHT/LEFT HEART CATH AND CORONARY ANGIOGRAPHY N/A 07/14/2019   Procedure: RIGHT/LEFT HEART CATH AND CORONARY ANGIOGRAPHY;  Surgeon: Marykay Lex, MD;  Location: [facility] INVASIVE CV LAB;; Angiographically normal coronary arteries. RHC  - PCWP & LVEDP 17 mmHg.  CO-CI 4.92-2.34   TEE WITHOUT CARDIOVERSION N/A 01/13/2018   Procedure: TRANSESOPHAGEAL ECHOCARDIOGRAM (TEE) with cardioversion - ;  Surgeon: Chrystie Nose, MD;  Location: [facility] ENDOSCOPY;  Service: Cardiovascular;  Laterality: N/A;  Mild concentric LVH.  EF 55 to 60%.  No R WMA.  Mild ascending aortic dilation of 4.2 cm.  Dilated left atrium.  No thrombus.   TONSILLECTOMY     TRANSTHORACIC ECHOCARDIOGRAM   10/11/2019   EF up to 35-40%.-Moderate-severely decreased function.  "GR 1 DD ".  Normal atrial sizes.  Moderate ascending aorta-44 mL.  Relatively normal valves.   TRANSTHORACIC ECHOCARDIOGRAM  06/2019   a) 06/2019: Severely reduced  LV Fxn w/th diffuse  HK & and septal dyskinesis.  EF <20%.  Reduced RV fxn w/ mildly elevated RVP--37 mmHg.  Mod R & Mild L Atrial dilation.  Asc Ao 42 mm; b) 10/2021:  EF 40%.  GR 1 DD.  Mild LVH.  Unable to assess PAP.  AoV sclerosis w/o stenosis.  Nl RAP.  Mild Asc Ao dilation.   VENTRAL HERNIA REPAIR N/A 07/22/2017   Procedure: OPEN VENTRAL HERNIA REPAIR WITH MESH ERAS PATHWAY;  Surgeon: Emelia Loron, MD;  Location: [facility] OR;  Service: General;  Laterality: N/A;  BILATERAL TAP BLOCK    His Family History Is Significant For: Family History  Problem Relation Age of Onset   Migraines Mother    Diabetes Mother        Died at 45   Other Mother        vertigo   Heart failure Father        03/25/2023 have had A. fib, died at 68   Congestive Heart Failure Father    Peripheral vascular disease Father    Other Sister        vertigo   Hypertension Brother        He is in his 11s   CVA Maternal Grandmother        74s   Other Niece        vertigo   Colon cancer Neg Hx    Esophageal cancer Neg Hx    Rectal cancer Neg Hx    Stomach cancer Neg Hx     His Social History Is Significant For: Social History   Socioeconomic History   Marital status: Married    Spouse name: Not on file   Number of children: Not on file   Years of education: 12   Highest education level: High school graduate  Occupational History   Occupation: Retired  Tobacco Use   Smoking status: Never   Smokeless tobacco: Never  Vaping Use   Vaping status: Never Used  Substance and Sexual Activity   Alcohol use: Not Currently    Comment: Rarely   Drug use: No   Sexual  activity: Not on file  Other Topics Concern   Not on file  Social History Narrative   He is a married father of 1,  grandfather of 1.     Lives with his wife.      Now started back in an exercise routine trying to exercise least 30 minutes a day, and doing outdoor activities including yard work/landscaping and planting.    Social Determinants of Health   Financial Resource Strain: Not on file  Food Insecurity: Low Risk  (03/31/2023)   [facility]   Food vital sign    Within the past 12 months, you worried that your food would run out before you got money to buy more: Never true    Within the past 12 months, the food you bought just didn't last and you didn't have money to get more. : Never true  Transportation Needs: No Transportation Needs (03/31/2023)   [facility]   Transportation    In the past 12 months, has lack of reliable transportation kept you from medical appointments, meetings, work or from getting things needed for daily living? : No  Physical Activity: Not on file  Stress: Not on file  Social Connections: Not on file    His Allergies Are:  Allergies  Allergen Reactions   Crestor [Rosuvastatin] Other (See Comments)    myalgias   Statins Other (See Comments)    Muscle pains, mouth sores   Zetia [Ezetimibe] Other (See Comments)    Broke out in sores/ muscle pain   Iodine Swelling and Rash    This is with topical betadine only.  No issues, per patient, with CT contrast. 10/16/21 J. Lohr, RN   Latex Rash    Bandages not gloves   Lipitor [Atorvastatin] Other (See Comments)    Knee pain   Livalo [Pitavastatin] Other (See Comments)    Muscle pain   Spironolactone Other (See Comments)    Soreness   Vaseline [Petrolatum] Rash    Break out   Zocor [Simvastatin] Other (See Comments)    myalgias  :   His Current Medications Are:  Outpatient Encounter Medications as of 05/28/2023  Medication Sig   acetaminophen (TYLENOL) 500 MG tablet Take 500 mg by mouth every 6 (six) hours as needed for moderate pain.    Alirocumab  (PRALUENT) 150 MG/ML SOAJ Inject 150 mg into the skin every 14 (fourteen) days.   amiodarone (PACERONE) 200 MG tablet TAKE 1 TABLET BY MOUTH DAILY   carvedilol (COREG) 3.125 MG tablet TAKE 1 TABLET BY MOUTH TWICE  DAILY   Cholecalciferol (VITAMIN D) 50 MCG (2000 UT) tablet Take 2,000 Units by mouth daily.   digoxin (LANOXIN) 0.125 MG tablet Take 1 tablet (125 mcg total) by mouth daily.   DULoxetine (CYMBALTA) 30 MG capsule Take 30 mg by mouth daily.   ENTRESTO 24-26 MG TAKE 1 TABLET BY MOUTH EVERY DAY IN THE MORNING   EPLERENONE PO Take by mouth.   furosemide (LASIX) 80 MG tablet TAKE 1 TABLET BY MOUTH TWICE  DAILY MAY TAKE 1 ADDITIONAL  TABLET DAILY AS NEEDED FOR  INCREASED SWELLING OR SHORTNESS  OF BREATH   hydrocortisone (ANUSOL-HC) 25 MG suppository PLACE 1 SUPPOSITORY (25 MG TOTAL) RECTALLY AT BEDTIME. *NOT COVERED*   LORazepam (ATIVAN) 0.5 MG tablet Take 0.5 mg by mouth daily as needed (severe dizziness).   meclizine (ANTIVERT) 25 MG tablet Take 25 mg by mouth 3 (three) times daily as needed  for dizziness.   metolazone (ZAROXOLYN) 2.5 MG tablet TAKE 1 TABLET BY MOUTH EVERY MONDAY, WEDNESDAY, AND FRIDAY. 30 MINUTES PRIOR TO YOUR FUROSEMIDE DOSE   Multiple Vitamins-Minerals (ULTRA MENS PACK PO) Take 1 tablet by mouth daily.   mupirocin ointment (BACTROBAN) 2 % Apply 1 application topically daily.   Omega-3 Fatty Acids (FISH OIL) 1200 MG CAPS Take 1,200 mg by mouth every other day.   omeprazole (PRILOSEC) 20 MG capsule TAKE 1 CAPSULE BY MOUTH DAILY   potassium chloride SA (KLOR-CON M) 20 MEQ tablet TAKE 1 TABLET BY MOUTH DAILY   rivaroxaban (XARELTO) 20 MG TABS tablet Take 1 tablet (20 mg total) by mouth daily with supper.   rosuvastatin (CRESTOR) 5 MG tablet TAKE 1 TABLET BY MOUTH DAILY   sacubitril-valsartan (ENTRESTO) 49-51 MG Take 1 tablet by mouth at bedtime. ,per Dr Herbie Baltimore  this is in additional dose than his morning dose.   SYMBICORT 160-4.5 MCG/ACT inhaler INHALE 2 PUFFS INTO THE  LUNGS IN THE MORNING AND AT BEDTIME.   tizanidine (ZANAFLEX) 2 MG capsule Take 2 mg by mouth at bedtime.   triamcinolone cream (KENALOG) 0.1 % Apply 1 application topically as needed.   gabapentin (NEURONTIN) 300 MG capsule 1 capsule (Patient not taking: Reported on 05/28/2023)   nitroGLYCERIN (NITROSTAT) 0.4 MG SL tablet Place 1 tablet (0.4 mg total) under the tongue every 5 (five) minutes as needed for chest pain.   [DISCONTINUED] rivaroxaban (XARELTO) 20 MG TABS tablet Take 1 tablet (20 mg total) by mouth daily with supper.   No facility-administered encounter medications on file as of 05/28/2023.  :   Review of Systems:  Out of a complete 14 point review of systems, all are reviewed and negative with the exception of these symptoms as listed below:  Review of Systems  Neurological:        Patient is here alone for dizziness. He states he was vacuuming about 2 months ago, turned a certain way, and felt like he did something to his sciatic nerve. His back and leg were bothering him. Shortly after that, he developed dizziness that comes and goes. He states it's only on his left side. Some days he can walk just fine and some days he is walking into walls but again, only on the left side. He states his mother, sister, and niece all have/had vertigo. Meclizine and Lorazepam seems to help him a little. He did physical therapy for a little while and they ended up seeing him once for vertigo but they after one visit he was told he needed to see neurology before physical therapy would see him again. He does not regularly check his BP but he is on Carvedilol.     Objective:  Neurological Exam  Physical Exam Physical Examination:    On orthostatic vital sign testing there was no orthostatic blood pressure drop but he felt dizzy when suddenly sitting up.  General Examination: The patient is a very pleasant 72 y.o. male in no acute distress. He appears well-developed and well-nourished and well  groomed.   HEENT: Normocephalic, atraumatic, pupils are equal, round and reactive to light, extraocular tracking is good without limitation to gaze excursion or nystagmus noted.  Corrective eyeglasses in place.  Upon my examination no vertiginous symptoms upon sudden change in head position, turning head left or right while laying down, when suddenly sitting up, denies dizziness or vertiginous spells upon my exam.   Hearing is grossly intact.  Tympanic membranes are clear bilaterally.  Face is symmetric with normal facial animation. Speech is clear with no dysarthria noted. There is no hypophonia. There is no lip, neck/head, jaw or voice tremor. Neck is supple with full range of passive and active motion. There are no carotid bruits on auscultation. Oropharynx exam reveals: mild mouth dryness, adequate dental hygiene and moderate airway crowding.  Tongue protrudes centrally and palate elevates symmetrically.  Chest: Clear to auscultation without wheezing, rhonchi or crackles noted.  Heart: S1+S2+0, regular and normal without murmurs, rubs or gallops noted.   Abdomen: Soft, non-tender and non-distended.  Extremities: There is no pitting edema in the distal lower extremities bilaterally.   Skin: Warm and dry with significant chronic appearing discoloration in the distal lower extremities bilaterally.   Musculoskeletal: exam reveals no obvious joint deformities.   Neurologically:  Mental status: The patient is awake, alert and oriented in all 4 spheres. His immediate and remote memory, attention, language skills and fund of knowledge are appropriate. There is no evidence of aphasia, agnosia, apraxia or anomia. Speech is clear with normal prosody and enunciation. Thought process is linear. Mood is normal and affect is normal.  Cranial nerves II - XII are as described above under HEENT exam.  Motor exam: Normal bulk, strength and tone is noted. There is no obvious action or resting tremor.  Fine  motor skills and coordination: Normal finger taps and hand movements, normal foot taps bilaterally.  No decrement in amplitude.    Cerebellar testing: No dysmetria or intention tremor. There is no truncal or gait ataxia.  Reflexes are trace in the upper extremities and absent in the lower extremities.  Toes are downgoing bilaterally. No drift or rebound. Sensory exam: intact to light touch in the upper and lower extremities.  Gait, station and balance: He stands with mild difficulty and stands up slowly, he walks slowly and cautiously.  He turns slowly.  Tandem walk is not tested, Romberg is not tested for safety concerns.  He is able to walk some without his cane.  He has a single-point cane.  No shuffling noted, preserved arm swing noted.  Assessment and Plan:  In summary, DAMIAN BUCKLES is a very pleasant 72 y.o.-year old male with an underlying complex medical history of atrial fibrillation, nonischemic cardiomyopathy, status post pacemaker placement, congestive heart failure, TIA, atrial fibrillation on Eliquis, asthma, reflux disease, hyperlipidemia, neck mass, cataracts, skin cancers, and obesity, who presents for evaluation of his intermittent dizziness of approximately 1 to 2 months duration with brief, fleeting symptoms of severe spinning sensation, which comes and goes, without telltale trigger but associated with tinnitus on the left side and feeling of left-sided dizziness.  Neurological exam is largely nonfocal, he had a brief dizzy spell when sitting up while doing orthostatic blood pressure testing with the nurse.  Upon my examination he did not have any vertiginous spells, symptoms could not be reproduced upon my exam.  He is largely reassured, I do not see any obvious neurologic cause of his dizziness, symptoms could be related to recent medications such as gabapentin, suboptimal hydration with water, other medication side effects as he is taking multiple medications.  Further evaluation  with ENT is recommended.  He is encouraged to make a follow-up appointment with his ENT provider.  He is advised to consider using a walker as he has been unsteady suddenly.  He has a cautious gait, which is nonspecific, no obvious evidence of parkinsonism on exam.  Given that he has no morning with his dizziness  he is advised not to drive currently until he becomes symptom-free.  He is advised to stay better hydrated with water and drink about 6 to 8 cups of water per day, 8 ounce size each.  We will pursue a brain MRI with and without contrast to rule out a structural cause of his symptoms.  If his pacemaker allows a brain MRI, I will request a hospital-based MRI in his case.  He is cautioned against using lorazepam daily as it can cause balance issues and sedation, meclizine can also cause sedation.  He is advised to follow-up with you as scheduled and we will keep him posted as to his brain MRI at results by phone call and plan to follow-up in this clinic accordingly.  He has already had physical therapy including vestibular rehab.  I answered all his questions today and he was in agreement with our plan.  He was given detailed written instructions in printed form (after visit summary) as well today.  Thank you very much for allowing me to participate in the care of this nice patient. If I can be of any further assistance to you please do not hesitate to call me at [phone].  Sincerely,   Randall Foley, MD, PhD  This was an extended visit of over 60 minutes with extended chart review, coordination of care and counseling involved and multiple record review.

## 2023-05-28 NOTE — Patient Instructions (Addendum)
  It was nice to meet you today.  I am not sure how to explain your dizziness as it comes on suddenly and may go away quickly.  You may have positional vertigo, and I recommend that you follow-up with your ENT.  Unfortunately, dizziness is a very common complaint but is often not due to a primary neurological reason or single underlying medical problem. Often, there a combination of factors, that result in dizziness. This includes blood pressure fluctuations, medication side effects, blood sugar fluctuations, stress, vertigo, poor sleep with sleep deprivation, dehydration, and electrolyte disturbance or other metabolic and endocrinological reasons, meaning hormone related problems such as thyroid dysfunction.  We will investigate things further with a brain MRI if you can have one (due to your pacemaker).  We will call you with the test results and can plan a follow-up in this clinic accordingly. I would like for you to consider starting to use a walker instead of a cane as you can become off balance suddenly.    Please try to hydrate better with water as you indicated you only drink 1 cup of water per day on average, I would recommend 6 to 8 cups of water per day, 8 ounce size each.  I recommend that you avoid taking lorazepam daily, I would also avoid taking gabapentin as it can make you dizzy.  Meclizine can make you sleepy, use with caution.  I would not recommend that you drive currently until you are symptom-free, as you are suddenly off balance and dizzy without warning.  If this happens while you drive, you can be at risk of losing control of your vehicle.

## 2023-06-15 ENCOUNTER — Other Ambulatory Visit: Payer: Self-pay | Admitting: Cardiology

## 2023-06-23 DIAGNOSIS — D2361 Other benign neoplasm of skin of right upper limb, including shoulder: Secondary | ICD-10-CM | POA: Diagnosis not present

## 2023-06-23 DIAGNOSIS — L245 Irritant contact dermatitis due to other chemical products: Secondary | ICD-10-CM | POA: Diagnosis not present

## 2023-06-23 DIAGNOSIS — L578 Other skin changes due to chronic exposure to nonionizing radiation: Secondary | ICD-10-CM | POA: Diagnosis not present

## 2023-06-23 DIAGNOSIS — Z85828 Personal history of other malignant neoplasm of skin: Secondary | ICD-10-CM | POA: Diagnosis not present

## 2023-06-26 DIAGNOSIS — M7021 Olecranon bursitis, right elbow: Secondary | ICD-10-CM | POA: Diagnosis not present

## 2023-07-17 DIAGNOSIS — M47896 Other spondylosis, lumbar region: Secondary | ICD-10-CM | POA: Diagnosis not present

## 2023-07-17 DIAGNOSIS — M5416 Radiculopathy, lumbar region: Secondary | ICD-10-CM | POA: Diagnosis not present

## 2023-07-17 DIAGNOSIS — M7021 Olecranon bursitis, right elbow: Secondary | ICD-10-CM | POA: Diagnosis not present

## 2023-07-17 DIAGNOSIS — M545 Low back pain, unspecified: Secondary | ICD-10-CM | POA: Diagnosis not present

## 2023-07-29 ENCOUNTER — Other Ambulatory Visit: Payer: Self-pay | Admitting: Cardiology

## 2023-07-29 DIAGNOSIS — I5042 Chronic combined systolic (congestive) and diastolic (congestive) heart failure: Secondary | ICD-10-CM

## 2023-07-29 DIAGNOSIS — I5043 Acute on chronic combined systolic (congestive) and diastolic (congestive) heart failure: Secondary | ICD-10-CM

## 2023-08-05 ENCOUNTER — Ambulatory Visit: Payer: Medicare Other | Admitting: Neurology

## 2023-08-07 ENCOUNTER — Ambulatory Visit: Payer: Medicare Other

## 2023-08-07 DIAGNOSIS — I420 Dilated cardiomyopathy: Secondary | ICD-10-CM | POA: Diagnosis not present

## 2023-08-07 LAB — CUP PACEART REMOTE DEVICE CHECK
Battery Remaining Longevity: 89 mo
Battery Voltage: 2.99 V
Brady Statistic AP VP Percent: 76.99 %
Brady Statistic AP VS Percent: 0.6 %
Brady Statistic AS VP Percent: 22.17 %
Brady Statistic AS VS Percent: 0.24 %
Brady Statistic RA Percent Paced: 77.57 %
Brady Statistic RV Percent Paced: 99.16 %
Date Time Interrogation Session: 20240926215757
Implantable Lead Connection Status: 753985
Implantable Lead Connection Status: 753985
Implantable Lead Connection Status: 753985
Implantable Lead Implant Date: 20210922
Implantable Lead Implant Date: 20211102
Implantable Lead Implant Date: 20211102
Implantable Lead Location: 753858
Implantable Lead Location: 753859
Implantable Lead Location: 753860
Implantable Lead Model: 3830
Implantable Lead Model: 4196
Implantable Lead Model: 5076
Implantable Pulse Generator Implant Date: 20210922
Lead Channel Impedance Value: 342 Ohm
Lead Channel Impedance Value: 361 Ohm
Lead Channel Impedance Value: 399 Ohm
Lead Channel Impedance Value: 437 Ohm
Lead Channel Impedance Value: 475 Ohm
Lead Channel Impedance Value: 475 Ohm
Lead Channel Impedance Value: 570 Ohm
Lead Channel Impedance Value: 627 Ohm
Lead Channel Impedance Value: 779 Ohm
Lead Channel Pacing Threshold Amplitude: 0.5 V
Lead Channel Pacing Threshold Amplitude: 1 V
Lead Channel Pacing Threshold Pulse Width: 0.4 ms
Lead Channel Pacing Threshold Pulse Width: 0.4 ms
Lead Channel Sensing Intrinsic Amplitude: 2.625 mV
Lead Channel Sensing Intrinsic Amplitude: 2.625 mV
Lead Channel Sensing Intrinsic Amplitude: 7.125 mV
Lead Channel Sensing Intrinsic Amplitude: 7.125 mV
Lead Channel Setting Pacing Amplitude: 1.5 V
Lead Channel Setting Pacing Amplitude: 2 V
Lead Channel Setting Pacing Amplitude: 2 V
Lead Channel Setting Pacing Pulse Width: 0.4 ms
Lead Channel Setting Pacing Pulse Width: 0.4 ms
Lead Channel Setting Sensing Sensitivity: 0.9 mV
Zone Setting Status: 755011

## 2023-08-11 DIAGNOSIS — R42 Dizziness and giddiness: Secondary | ICD-10-CM | POA: Diagnosis not present

## 2023-08-11 DIAGNOSIS — Z7901 Long term (current) use of anticoagulants: Secondary | ICD-10-CM | POA: Diagnosis not present

## 2023-08-11 DIAGNOSIS — I480 Paroxysmal atrial fibrillation: Secondary | ICD-10-CM | POA: Diagnosis not present

## 2023-08-11 DIAGNOSIS — Z0000 Encounter for general adult medical examination without abnormal findings: Secondary | ICD-10-CM | POA: Diagnosis not present

## 2023-08-11 DIAGNOSIS — D6869 Other thrombophilia: Secondary | ICD-10-CM | POA: Diagnosis not present

## 2023-08-11 DIAGNOSIS — Z950 Presence of cardiac pacemaker: Secondary | ICD-10-CM | POA: Diagnosis not present

## 2023-08-11 DIAGNOSIS — I420 Dilated cardiomyopathy: Secondary | ICD-10-CM | POA: Diagnosis not present

## 2023-08-11 DIAGNOSIS — E785 Hyperlipidemia, unspecified: Secondary | ICD-10-CM | POA: Diagnosis not present

## 2023-08-11 DIAGNOSIS — I7121 Aneurysm of the ascending aorta, without rupture: Secondary | ICD-10-CM | POA: Diagnosis not present

## 2023-08-13 NOTE — Progress Notes (Signed)
Remote pacemaker transmission.   

## 2023-08-14 DIAGNOSIS — R42 Dizziness and giddiness: Secondary | ICD-10-CM | POA: Diagnosis not present

## 2023-08-17 DIAGNOSIS — M7021 Olecranon bursitis, right elbow: Secondary | ICD-10-CM | POA: Diagnosis not present

## 2023-08-28 ENCOUNTER — Ambulatory Visit: Payer: Medicare Other | Attending: Cardiology | Admitting: Cardiology

## 2023-08-28 ENCOUNTER — Encounter: Payer: Self-pay | Admitting: Cardiology

## 2023-08-28 VITALS — BP 110/78 | HR 62 | Ht 69.0 in | Wt 232.4 lb

## 2023-08-28 DIAGNOSIS — I4811 Longstanding persistent atrial fibrillation: Secondary | ICD-10-CM | POA: Diagnosis not present

## 2023-08-28 DIAGNOSIS — E785 Hyperlipidemia, unspecified: Secondary | ICD-10-CM | POA: Diagnosis not present

## 2023-08-28 DIAGNOSIS — I495 Sick sinus syndrome: Secondary | ICD-10-CM | POA: Diagnosis not present

## 2023-08-28 DIAGNOSIS — T50905D Adverse effect of unspecified drugs, medicaments and biological substances, subsequent encounter: Secondary | ICD-10-CM

## 2023-08-28 DIAGNOSIS — I5042 Chronic combined systolic (congestive) and diastolic (congestive) heart failure: Secondary | ICD-10-CM

## 2023-08-28 DIAGNOSIS — E66812 Obesity, class 2: Secondary | ICD-10-CM

## 2023-08-28 DIAGNOSIS — N62 Hypertrophy of breast: Secondary | ICD-10-CM | POA: Diagnosis not present

## 2023-08-28 DIAGNOSIS — I420 Dilated cardiomyopathy: Secondary | ICD-10-CM

## 2023-08-28 DIAGNOSIS — I7121 Aneurysm of the ascending aorta, without rupture: Secondary | ICD-10-CM | POA: Diagnosis not present

## 2023-08-28 DIAGNOSIS — T50905A Adverse effect of unspecified drugs, medicaments and biological substances, initial encounter: Secondary | ICD-10-CM

## 2023-08-28 DIAGNOSIS — K625 Hemorrhage of anus and rectum: Secondary | ICD-10-CM | POA: Diagnosis not present

## 2023-08-28 DIAGNOSIS — D6869 Other thrombophilia: Secondary | ICD-10-CM | POA: Diagnosis not present

## 2023-08-28 DIAGNOSIS — I4819 Other persistent atrial fibrillation: Secondary | ICD-10-CM

## 2023-08-28 DIAGNOSIS — Z79899 Other long term (current) drug therapy: Secondary | ICD-10-CM | POA: Diagnosis not present

## 2023-08-28 MED ORDER — RIVAROXABAN 20 MG PO TABS: 20.0000 mg | ORAL_TABLET | Freq: Every day | ORAL | 0 refills | Status: DC

## 2023-08-28 NOTE — Assessment & Plan Note (Signed)
Need for routine maintenance surveillance. -Annual eye check, thyroid levels, liver function tests, and lung function tests as part of Amiodarone monitoring. -> Will need to add ESR and CRP to next set of labs and consider PFTs next year

## 2023-08-28 NOTE — Assessment & Plan Note (Signed)
Likely due to internal hemorrhoids. Occasional bright red blood in stool. -Use suppositories as needed. -Hold Xarelto for 1-2 days if bleeding is significant.

## 2023-08-28 NOTE — Assessment & Plan Note (Signed)
Not felt to be ablation candidate. No reported symptoms of Recurrent Afib on maintenance Amiodarone.  -Continue Amiodarone for rhythm control along  - Continue carvedilol and digoxin for rate control.

## 2023-08-28 NOTE — Assessment & Plan Note (Signed)
.    Seems to be relatively euvolemic today.   Improved symptoms with weight loss => NYHA Class IIa symptoms now.  Improved from previously to IIb.   Maintaining his dry weight of about 225 pounds at home.  Usually averaging somewhere tween 222 at 228 on Lasix 80mg  daily & Metolazone used three times weekly. Occasionally requires additional Lasix dose for fluid overload= maybe 2-3 times a week.  Plan: Regimen includes carvedilol 3.125 mg twice daily, digoxin 0.25 mg daily,  Continue Entresto which she takes 24-26 mg in the morning (which allows him to be more mobile during the day), and 49-51 mg in the evening.  This regimen has proven to be very effective. Continue current diuretic regimen as noted above => with sliding scale (Dry weight 225lbs). 80 mg daily furosemide with PRN additional dose Zaroxolyn 2.5 mg Monday Wednesday and Friday Eplerenone-converted because of gynecomastia with spironolactone. Check labs including renal function. For now, I will hold off on SGLT2 inhibitor because of the cost of all of his other medications becoming quite an issue.

## 2023-08-28 NOTE — Assessment & Plan Note (Signed)
Likely combination of longstanding paroxysmal/persistent A-fib with left bundle ranch block. Unfortunately no real improvement with CRT-P although he does have symptomatic improvement with the ability to use amiodarone to suppress A-fib and having the pacemaker pace.  He has atrial kick and that improves symptoms.  Remains on stable regimen.  No change for now.

## 2023-08-28 NOTE — Progress Notes (Signed)
Cardiology Office Note:  .   Date:  08/28/2023  ID:  Randall Best, DOB 12-22-1950, MRN [id number] PCP: Verl Blalock  [facility] Providers Cardiologist:  Bryan Lemma, MD Electrophysiologist:  Will Jorja Loa, MD     Chief Complaint  Patient presents with   New Patient (Initial Visit)    6 month   Congestive Heart Failure   Atrial Fibrillation    Patient Profile: Randall Best     Randall Best is an obese 72 y.o. male with a PMH notable for PERSISTENT A-FIB (amiodarone, Xarelto, digoxin), CHRONIC COMBINED CHF/NICM (on carvedilol, Entresto and digoxin), TACHYBRADYCARDIA SYNDROME-s/p CRT-P in 2021, LBBB, HLD (on Crestor and Praluent), Chronic Venous Sufficiency who presents here for 52-month follow-up at the request of Sheliah Hatch, PA-C.    Randall Best was last seen on 02/13/2023 by Bernadene Person, NP (I had not seen him since July 2023).  Noted that A-fib managed with amiodarone and Xarelto.  Due to gynecomastia on spironolactone he was switched to eplerenone.  He has chronic 3-4 pillow orthopnea.  He was doing well with improved dyspnea.  He lost 20 pounds due to dietary changes.  Occasional fleeting chest discomfort but not with activity.  No PND orthopnea.  Was very concerned about his deductible with Xarelto, Entresto and Praluent.  Euvolemic.  Concerning symptoms for possible claudication with legs burning with walking-ABIs ordered.  He had most recently been seen by Dr. Elberta Fortis from EP in October 2023-stable from a cardiac standpoint at that time.  Still had about 3-4 pillow orthopnea but normal BNP.  Subjective  Discussed the use of AI scribe software for clinical note transcription with the patient, who gave verbal consent to proceed.  History of Present Illness   The patient, with a known history of heart disease and aortic aneurysm, presented for a routine follow-up. He reported significant improvement in his symptoms of dyspnea, particularly at  night, attributing this to a weight loss of about 20 pounds. The patient noted occasional episodes of fluid retention, managed by adjusting his daily dose of Lasix, which he takes regularly along with Metolazone three times a week. He also reported a consistent medication regimen including Digoxin, Amiodarone, and Crestor, the latter of which he has self-adjusted to a higher dose without adverse effects.  The patient mentioned a recent episode of rectal bleeding, which he believes to be due to internal hemorrhoids, as it resolved with the use of suppositories. He also reported a history of a mini-stroke and is currently on Xarelto, which he briefly held during the episode of rectal bleeding.  The patient has been experiencing dizziness, particularly in the early mornings, which is currently under investigation with a scheduled MRI. He also reported a history of pre-skin cancer and is on Prolia, which he takes once a month due to cost considerations.  The patient is highly proactive about his health, engaging in regular physical activity, maintaining a careful diet, and monitoring his weight daily. He has also been compliant with wearing support socks for swelling management. He has an upcoming CT angiogram scheduled for monitoring of his known aortic aneurysm.     He describes his PND/orthopnea symptoms as "smothering ".  He still has occasional episodes but most recently has not been all that common.  Usually he is able to control his volume with diuretics and has not had the significant orthopnea and PND symptoms of late.  Much better since losing his weight with diet and exercise  changes.  He remains active now.  He is trying to do a least 4 hours of activity a day if not more.  He is not having any chest pain or pressure with rest or activity, but will get a little short of breath if he overdoes it.  With adjusting his Lasix doses he has been able to maintain his weight somewhere between 222 to 228  pounds at home.  (This morning was 228 pounds which would mean that he should take extra dose of furosemide today)  Cardiovascular ROS: positive for - orthopnea, paroxysmal nocturnal dyspnea, shortness of breath, and usually controlled with diuretic dosing - sliding scale - home dry wgt btw 222-228 lb. negative for - chest pain, irregular heartbeat, palpitations, rapid heart rate, or syncope/near syncope/TIA/amaurosis fugax, claudication  + Annual Eye exam - OK.  The previous year he had had some "film in his eye "that was "scraped away ".  Since then he is doing well with no further issues.  ROS:  Review of Systems - Negative except Mild hemorrhoidal bleed;~labyrinthitis/dizziness; mild "skin condition "     Objective   Studies Reviewed: Randall Best   EKG Interpretation Date/Time:  Friday August 28 2023 09:01:55 EDT Ventricular Rate:  62 PR Interval:  176 QRS Duration:  176 QT Interval:  468 QTC Calculation: 475 R Axis:   -33  Text Interpretation: AV dual-paced rhythm Biventricular pacemaker detected When compared with ECG of 01-Aug-2020 11:58, Electronic ventricular pacemaker has replaced Sinus rhythm Confirmed by Bryan Lemma ([id number]) on 08/28/2023 9:11:38 AM   Results Lab Results  Component Value Date   CHOL 109 02/13/2023   HDL 36 (L) 02/13/2023   LDLCALC 52 02/13/2023   TRIG 115 02/13/2023   CHOLHDL 3.0 02/13/2023   Lab Results  Component Value Date   NA 142 02/13/2023   K 3.8 02/13/2023   CREATININE 1.10 02/13/2023   EGFR 72 02/13/2023   GLUCOSE 101 (H) 02/13/2023   Lab Results  Component Value Date   ALT 36 02/13/2023   AST 31 02/13/2023   ALKPHOS 63 02/13/2023   BILITOT 0.5 02/13/2023   Lab Results  Component Value Date   TSH 1.320 02/13/2023   RADIOLOGY Carotid Doppler: Both carotids, vertebrals, and subclavians normal (04/20/2023) Lower Extremity Arterial Doppler: ABIs normal (03/10/2023)  DIAGNOSTIC REPORTS ECHO 10/29/2021: EF estimated 40% with mildly  decreased function and global HK.  Also mildly reduced RV function.  AOV sclerosis with no stenosis.  Ascending aorta measured at 42 mm. CATH 07/31/2019: Angiographically normal coronary arteries.  EF roughly 25%.  Compensated RHC numbers.  Risk Assessment/Calculations:    CHA2DS2-VASc Score = 6   This indicates a 9.7% annual risk of stroke. The patient's score is based upon: CHF History: 1 HTN History: 1 Diabetes History: 0 Stroke History: 2 Vascular Disease History: 1 Age Score: 1 Gender Score: 0           Physical Exam:   VS:  BP 110/78 (BP Location: Left Arm, Patient Position: Sitting, Cuff Size: Normal)   Pulse 62   Ht 5\' 9"  (1.753 m)   Wt 232 lb 6.4 oz (105.4 kg)   SpO2 95%   BMI 34.32 kg/m    Wt Readings from Last 3 Encounters:  08/28/23 232 lb 6.4 oz (105.4 kg)  05/28/23 228 lb (103.4 kg)  02/13/23 230 lb (104.3 kg)    GEN: Well nourished, well groomed in no acute distress; relatively healthy appearing.  Moderately obese. NECK: JVD ~7 cm water; No  carotid bruits CARDIAC: Normal S1, S2; RRR with occasional ectopy; S4 gallop.  No murmurs or rubs; mildly diminished pedal pulses. RESPIRATORY:  Clear to auscultation without rales, wheezing or rhonchi ; nonlabored, good air movement. ABDOMEN: Soft, non-tender, non-distended EXTREMITIES:  No edema-wearing support stockings.; No deformity      ASSESSMENT AND PLAN: .    Problem List Items Addressed This Visit       Cardiology Problems   Chronic combined systolic and diastolic heart failure (HCC) (Chronic)    .  Seems to be relatively euvolemic today.   Improved symptoms with weight loss => NYHA Class IIa symptoms now.  Improved from previously to IIb.   Maintaining his dry weight of about 225 pounds at home.  Usually averaging somewhere tween 222 at 228 on Lasix 80mg  daily & Metolazone used three times weekly. Occasionally requires additional Lasix dose for fluid overload= maybe 2-3 times a week.  Plan: Regimen  includes carvedilol 3.125 mg twice daily, digoxin 0.25 mg daily,  Continue Entresto which she takes 24-26 mg in the morning (which allows him to be more mobile during the day), and 49-51 mg in the evening.  This regimen has proven to be very effective. Continue current diuretic regimen as noted above => with sliding scale (Dry weight 225lbs). 80 mg daily furosemide with PRN additional dose Zaroxolyn 2.5 mg Monday Wednesday and Friday Eplerenone-converted because of gynecomastia with spironolactone. Check labs including renal function. For now, I will hold off on SGLT2 inhibitor because of the cost of all of his other medications becoming quite an issue.      Relevant Medications   rivaroxaban (XARELTO) 20 MG TABS tablet   Hypercoagulable state due to longstanding persistent atrial fibrillation: CHA2DS2-VASc score 4 (Chronic)    CHA2DS2-VASc score 6.  Remains on Xarelto and doing pretty well.  The bleeding seems to be more hemorrhoidal.  As he seems to be maintaining sinus rhythm on amiodarone.  Okay to hold Xarelto for procedures or surgeries.-Per protocol without need for bridging.      Relevant Medications   rivaroxaban (XARELTO) 20 MG TABS tablet   Hyperlipidemia LDL goal <70 (Chronic)    On Crestor 10mg  daily. (he increased from 5 mg ) & Praluent 150mg  1 x monthly => he decided to titrate up to 5 mg of Crestor and is able to tolerate the higher dose.  He made this decision when he chose to reduce Praluent to once a month as opposed to twice a month. -Continue combination medicines as is. -Check lipid panel.      Relevant Medications   rivaroxaban (XARELTO) 20 MG TABS tablet   Other Relevant Orders   Lipid panel   Nonischemic dilated cardiomyopathy (HCC) (Chronic)    Likely combination of longstanding paroxysmal/persistent A-fib with left bundle ranch block. Unfortunately no real improvement with CRT-P although he does have symptomatic improvement with the ability to use amiodarone  to suppress A-fib and having the pacemaker pace.  He has atrial kick and that improves symptoms.  Remains on stable regimen.  No change for now.      Relevant Medications   rivaroxaban (XARELTO) 20 MG TABS tablet   Other Relevant Orders   TSH   Comprehensive metabolic panel   Persistent atrial fibrillation (HCC): CHA2DS2-VASc Score 4 (CHF, HTN, Age 89, Aortic Plaque) - Primary (Chronic)    Not felt to be ablation candidate. No reported symptoms of Recurrent Afib on maintenance Amiodarone.  -Continue Amiodarone for rhythm control along  -  Continue carvedilol and digoxin for rate control.       Relevant Medications   rivaroxaban (XARELTO) 20 MG TABS tablet   Other Relevant Orders   EKG 12-Lead (Completed)   TSH   Comprehensive metabolic panel   Tachycardia-bradycardia syndrome (HCC) (Chronic)    Status post CRT-P.  Patient seems to be working well.  This allows [place] to maintain sinus rhythm on amiodarone.      Relevant Medications   rivaroxaban (XARELTO) 20 MG TABS tablet   Thoracic aortic aneurysm (HCC) (Chronic)    Last CT angiogram in August of the previous year showed slight growth. Has previously been measured at 43 mm -Order CT angiogram of the aorta to monitor for stability.      Relevant Medications   rivaroxaban (XARELTO) 20 MG TABS tablet   Other Relevant Orders   CT ANGIO CHEST AORTA W/CM & OR WO/CM     Other   Bright red rectal bleeding (Chronic)    Likely due to internal hemorrhoids. Occasional bright red blood in stool. -Use suppositories as needed. -Hold Xarelto for 1-2 days if bleeding is significant.      Drug-induced gynecomastia    Converted from spironolactone to eplerenone      Long term current use of amiodarone (Chronic)    Need for routine maintenance surveillance. -Annual eye check, thyroid levels, liver function tests, and lung function tests as part of Amiodarone monitoring. -> Will need to add ESR and CRP to next set of labs and consider  PFTs next year      Obesity, Class II, BMI 35-39.9 (Chronic)   Other Visit Diagnoses     Medication management       Relevant Orders   Lipid panel   TSH   Comprehensive metabolic panel      Pre-skin Cancer On Prolia once monthly. -Continue Prolia.  Dizziness Early morning dizziness, Pending MRI scheduled for evaluation. -Follow up on MRI results.        Follow up: Return for Alternate 6 month follow-up with APP & MD. 6 months with Bernadene Person, NP and 1 year with the physician.  Total time spent: 31 min spent with patient + 26 min spent charting = 57 min     Signed, Marykay Lex, MD, MS Bryan Lemma, M.D., M.S. Interventional Cardiologist  [facility]  Pager # [phone] Phone # [phone] [address] [place], [place] [address]

## 2023-08-28 NOTE — Patient Instructions (Addendum)
Medication Instructions:  No changes    *If you need a refill on your cardiac medications before your next appointment, please call your pharmacy*   Lab Work: Your physician recommends that you have these labs drawn today: - CMP - TSH - Lipid panel     If you have labs (blood work) drawn today and your tests are completely normal, you will receive your results only by: MyChart Message (if you have MyChart) OR A paper copy in the mail If you have any lab test that is abnormal or we need to change your treatment, we will call you to review the results.   Testing/Procedures:  Non-Cardiac CT Angiography (CTA), is a special type of CT scan that uses a computer to produce multi-dimensional views of major blood vessels throughout the body. In CT angiography, a contrast material is injected through an IV to help visualize the blood vessels .instct   Follow-Up: At [facility], you and your health needs are our priority.  As part of our continuing mission to provide you with exceptional heart care, we have created designated Provider Care Teams.  These Care Teams include your primary Cardiologist (physician) and Advanced Practice Providers (APPs -  Physician Assistants and Nurse Practitioners) who all work together to provide you with the care you need, when you need it.  We recommend signing up for the patient portal called "MyChart".  Sign up information is provided on this After Visit Summary.  MyChart is used to connect with patients for Virtual Visits (Telemedicine).  Patients are able to view lab/test results, encounter notes, upcoming appointments, etc.  Non-urgent messages can be sent to your provider as well.   To learn more about what you can do with MyChart, go to [email or web address].    Your next appointment:   6 month(s)  The format for your next appointment:   In Person  Provider:   Bernadene Person, NP  Then, Bryan Lemma, MD will plan to see you again in 1  year(s).

## 2023-08-28 NOTE — Assessment & Plan Note (Signed)
Last CT angiogram in August of the previous year showed slight growth. Has previously been measured at 43 mm -Order CT angiogram of the aorta to monitor for stability.

## 2023-08-28 NOTE — Assessment & Plan Note (Signed)
CHA2DS2-VASc score 6.  Remains on Xarelto and doing pretty well.  The bleeding seems to be more hemorrhoidal.  As he seems to be maintaining sinus rhythm on amiodarone.  Okay to hold Xarelto for procedures or surgeries.-Per protocol without need for bridging.

## 2023-08-28 NOTE — Assessment & Plan Note (Signed)
Status post CRT-P.  Patient seems to be working well.  This allows [place] to maintain sinus rhythm on amiodarone.

## 2023-08-28 NOTE — Assessment & Plan Note (Addendum)
On Crestor 10mg  daily. (he increased from 5 mg ) & Praluent 150mg  1 x monthly => he decided to titrate up to 5 mg of Crestor and is able to tolerate the higher dose.  He made this decision when he chose to reduce Praluent to once a month as opposed to twice a month. -Continue combination medicines as is. -Check lipid panel.

## 2023-08-28 NOTE — Assessment & Plan Note (Signed)
Converted from spironolactone to eplerenone

## 2023-08-29 LAB — COMPREHENSIVE METABOLIC PANEL
ALT: 29 [IU]/L (ref 0–44)
AST: 20 [IU]/L (ref 0–40)
Albumin: 5 g/dL — ABNORMAL HIGH (ref 3.8–4.8)
Alkaline Phosphatase: 66 [IU]/L (ref 44–121)
BUN/Creatinine Ratio: 19 (ref 10–24)
BUN: 22 mg/dL (ref 8–27)
Bilirubin Total: 0.3 mg/dL (ref 0.0–1.2)
CO2: 26 mmol/L (ref 20–29)
Calcium: 9.5 mg/dL (ref 8.6–10.2)
Chloride: 97 mmol/L (ref 96–106)
Creatinine, Ser: 1.16 mg/dL (ref 0.76–1.27)
Globulin, Total: 2.5 g/dL (ref 1.5–4.5)
Glucose: 118 mg/dL — ABNORMAL HIGH (ref 70–99)
Potassium: 4.1 mmol/L (ref 3.5–5.2)
Sodium: 142 mmol/L (ref 134–144)
Total Protein: 7.5 g/dL (ref 6.0–8.5)
eGFR: 67 mL/min/{1.73_m2} (ref >59)

## 2023-08-29 LAB — TSH: TSH: 1.34 u[IU]/mL (ref 0.450–4.500)

## 2023-08-29 LAB — LIPID PANEL
Chol/HDL Ratio: 3.9 {ratio} (ref 0.0–5.0)
Cholesterol, Total: 180 mg/dL (ref 100–199)
HDL: 46 mg/dL (ref >39)
LDL Chol Calc (NIH): 96 mg/dL (ref 0–99)
Triglycerides: 221 mg/dL — ABNORMAL HIGH (ref 0–149)
VLDL Cholesterol Cal: 38 mg/dL (ref 5–40)

## 2023-08-31 ENCOUNTER — Ambulatory Visit (HOSPITAL_COMMUNITY)
Admission: RE | Admit: 2023-08-31 | Discharge: 2023-08-31 | Disposition: A | Discharge status: Home / Self Care | Payer: Medicare Other | Source: Ambulatory Visit | Attending: Cardiology | Admitting: Cardiology

## 2023-08-31 DIAGNOSIS — I517 Cardiomegaly: Secondary | ICD-10-CM | POA: Diagnosis not present

## 2023-08-31 DIAGNOSIS — I7121 Aneurysm of the ascending aorta, without rupture: Secondary | ICD-10-CM | POA: Diagnosis not present

## 2023-08-31 MED ORDER — IOHEXOL 350 MG/ML SOLN
75.0000 mL | Freq: Once | INTRAVENOUS | Status: AC | PRN
  Administered 2023-08-31: 75 mL via INTRAVENOUS

## 2023-09-04 ENCOUNTER — Ambulatory Visit (HOSPITAL_COMMUNITY)
Admission: RE | Admit: 2023-09-04 | Discharge: 2023-09-04 | Disposition: A | Discharge status: Home / Self Care | Payer: Medicare Other | Source: Ambulatory Visit | Attending: Neurology | Admitting: Neurology

## 2023-09-04 DIAGNOSIS — R42 Dizziness and giddiness: Secondary | ICD-10-CM | POA: Insufficient documentation

## 2023-09-04 DIAGNOSIS — H9312 Tinnitus, left ear: Secondary | ICD-10-CM | POA: Diagnosis not present

## 2023-09-04 DIAGNOSIS — Z950 Presence of cardiac pacemaker: Secondary | ICD-10-CM | POA: Insufficient documentation

## 2023-09-04 DIAGNOSIS — R55 Syncope and collapse: Secondary | ICD-10-CM | POA: Diagnosis not present

## 2023-09-04 DIAGNOSIS — R2689 Other abnormalities of gait and mobility: Secondary | ICD-10-CM | POA: Insufficient documentation

## 2023-09-04 DIAGNOSIS — Z9189 Other specified personal risk factors, not elsewhere classified: Secondary | ICD-10-CM | POA: Diagnosis not present

## 2023-09-04 DIAGNOSIS — I6782 Cerebral ischemia: Secondary | ICD-10-CM | POA: Diagnosis not present

## 2023-09-04 MED ORDER — GADOBUTROL 1 MMOL/ML IV SOLN
10.0000 mL | Freq: Once | INTRAVENOUS | Status: AC | PRN
  Administered 2023-09-04: 10 mL via INTRAVENOUS

## 2023-09-06 NOTE — Progress Notes (Signed)
CT scan shows maybe slightly increased diameter of the ascending aorta, but read is being pretty much unchanged.  No other abnormal findings noted. Will continue to follow with annual scan but neck she will do a MRA.  Bryan Lemma, MD

## 2023-09-07 ENCOUNTER — Telehealth: Payer: Self-pay | Admitting: *Deleted

## 2023-09-07 NOTE — Telephone Encounter (Signed)
-----   Message from [name] sent at 09/07/2023  7:37 AM EDT ----- Please call patient and advise him that his recent brain MRI with and without contrast did not show any acute findings, chronic and fairly stable findings were seen compared to his MRI from 2019.  There was a little bit of fluid in the sinuses behind the ears, these are called mastoid air cells.  This can eventually lead to inner ear dysfunction but I doubt that this is relevant for his ringing in the ears or dizziness.  I recommend that he discuss this finding with his ENT as well. At this juncture, he can follow-up with primary care and ENT.

## 2023-09-08 ENCOUNTER — Telehealth: Payer: Self-pay | Admitting: *Deleted

## 2023-09-08 NOTE — Telephone Encounter (Signed)
-----   Message from Randall Best sent at 09/07/2023  7:37 AM EDT ----- Please call patient and advise him that his recent brain MRI with and without contrast did not show any acute findings, chronic and fairly stable findings were seen compared to his MRI from 2019.  There was a little bit of fluid in the sinuses behind the ears, these are called mastoid air cells.  This can eventually lead to inner ear dysfunction but I doubt that this is relevant for his ringing in the ears or dizziness.  I recommend that he discuss this finding with his ENT as well. At this juncture, he can follow-up with primary care and ENT.

## 2023-09-08 NOTE — Telephone Encounter (Signed)
I called pt and relayed the results of the MRI to pt as below.  He has appt with vestibular rehab 09-18-2023 at [facility] ([facility]  He is much better other then in AM (on awakening) has some dizziness.  He is well hydrated, which he feels has helped as well.  He was very appreciative and did verbalized understanding.

## 2023-09-14 ENCOUNTER — Encounter: Payer: Self-pay | Admitting: Pharmacist

## 2023-09-18 DIAGNOSIS — R42 Dizziness and giddiness: Secondary | ICD-10-CM | POA: Diagnosis not present

## 2023-10-03 ENCOUNTER — Other Ambulatory Visit: Payer: Self-pay | Admitting: Cardiology

## 2023-11-06 ENCOUNTER — Other Ambulatory Visit: Payer: Self-pay | Admitting: Cardiology

## 2023-11-06 ENCOUNTER — Ambulatory Visit: Payer: Medicare Other

## 2023-11-06 DIAGNOSIS — I420 Dilated cardiomyopathy: Secondary | ICD-10-CM | POA: Diagnosis not present

## 2023-11-09 LAB — CUP PACEART REMOTE DEVICE CHECK
Battery Remaining Longevity: 80 mo
Battery Voltage: 2.98 V
Brady Statistic AP VP Percent: 76.5 %
Brady Statistic AP VS Percent: 0.87 %
Brady Statistic AS VP Percent: 22.14 %
Brady Statistic AS VS Percent: 0.49 %
Brady Statistic RA Percent Paced: 77.37 %
Brady Statistic RV Percent Paced: 98.63 %
Date Time Interrogation Session: 20241227231037
Implantable Lead Connection Status: 753985
Implantable Lead Connection Status: 753985
Implantable Lead Connection Status: 753985
Implantable Lead Implant Date: 20210922
Implantable Lead Implant Date: 20211102
Implantable Lead Implant Date: 20211102
Implantable Lead Location: 753858
Implantable Lead Location: 753859
Implantable Lead Location: 753860
Implantable Lead Model: 3830
Implantable Lead Model: 4196
Implantable Lead Model: 5076
Implantable Pulse Generator Implant Date: 20210922
Lead Channel Impedance Value: 342 Ohm
Lead Channel Impedance Value: 342 Ohm
Lead Channel Impedance Value: 380 Ohm
Lead Channel Impedance Value: 437 Ohm
Lead Channel Impedance Value: 456 Ohm
Lead Channel Impedance Value: 475 Ohm
Lead Channel Impedance Value: 551 Ohm
Lead Channel Impedance Value: 627 Ohm
Lead Channel Impedance Value: 779 Ohm
Lead Channel Pacing Threshold Amplitude: 0.5 V
Lead Channel Pacing Threshold Amplitude: 1.125 V
Lead Channel Pacing Threshold Pulse Width: 0.4 ms
Lead Channel Pacing Threshold Pulse Width: 0.4 ms
Lead Channel Sensing Intrinsic Amplitude: 2.5 mV
Lead Channel Sensing Intrinsic Amplitude: 2.5 mV
Lead Channel Sensing Intrinsic Amplitude: 7.125 mV
Lead Channel Sensing Intrinsic Amplitude: 7.125 mV
Lead Channel Setting Pacing Amplitude: 1.5 V
Lead Channel Setting Pacing Amplitude: 2 V
Lead Channel Setting Pacing Amplitude: 2.25 V
Lead Channel Setting Pacing Pulse Width: 0.4 ms
Lead Channel Setting Pacing Pulse Width: 0.4 ms
Lead Channel Setting Sensing Sensitivity: 0.9 mV
Zone Setting Status: 755011

## 2023-12-10 NOTE — Addendum Note (Signed)
Addended by: Elease Etienne A on: 12/10/2023 01:32 PM   Modules accepted: Orders

## 2023-12-10 NOTE — Progress Notes (Signed)
Remote pacemaker transmission.

## 2023-12-15 ENCOUNTER — Other Ambulatory Visit: Payer: Self-pay | Admitting: Cardiology

## 2023-12-15 DIAGNOSIS — I5042 Chronic combined systolic (congestive) and diastolic (congestive) heart failure: Secondary | ICD-10-CM

## 2023-12-15 DIAGNOSIS — I5043 Acute on chronic combined systolic (congestive) and diastolic (congestive) heart failure: Secondary | ICD-10-CM

## 2024-01-04 DIAGNOSIS — Z961 Presence of intraocular lens: Secondary | ICD-10-CM | POA: Diagnosis not present

## 2024-01-04 DIAGNOSIS — H43813 Vitreous degeneration, bilateral: Secondary | ICD-10-CM | POA: Diagnosis not present

## 2024-01-04 DIAGNOSIS — H16223 Keratoconjunctivitis sicca, not specified as Sjogren's, bilateral: Secondary | ICD-10-CM | POA: Diagnosis not present

## 2024-01-04 DIAGNOSIS — H35343 Macular cyst, hole, or pseudohole, bilateral: Secondary | ICD-10-CM | POA: Diagnosis not present

## 2024-01-07 DIAGNOSIS — L089 Local infection of the skin and subcutaneous tissue, unspecified: Secondary | ICD-10-CM | POA: Diagnosis not present

## 2024-01-18 DIAGNOSIS — L923 Foreign body granuloma of the skin and subcutaneous tissue: Secondary | ICD-10-CM | POA: Diagnosis not present

## 2024-01-21 ENCOUNTER — Ambulatory Visit

## 2024-01-21 ENCOUNTER — Other Ambulatory Visit: Payer: Self-pay

## 2024-01-21 ENCOUNTER — Ambulatory Visit: Admitting: Podiatry

## 2024-01-21 ENCOUNTER — Encounter: Payer: Self-pay | Admitting: Podiatry

## 2024-01-21 DIAGNOSIS — M19071 Primary osteoarthritis, right ankle and foot: Secondary | ICD-10-CM | POA: Diagnosis not present

## 2024-01-21 DIAGNOSIS — M7731 Calcaneal spur, right foot: Secondary | ICD-10-CM | POA: Diagnosis not present

## 2024-01-21 DIAGNOSIS — L03115 Cellulitis of right lower limb: Secondary | ICD-10-CM

## 2024-01-21 DIAGNOSIS — M79671 Pain in right foot: Secondary | ICD-10-CM | POA: Diagnosis not present

## 2024-01-21 DIAGNOSIS — M2141 Flat foot [pes planus] (acquired), right foot: Secondary | ICD-10-CM | POA: Diagnosis not present

## 2024-01-21 DIAGNOSIS — S90851A Superficial foreign body, right foot, initial encounter: Secondary | ICD-10-CM | POA: Diagnosis not present

## 2024-01-21 NOTE — Progress Notes (Signed)
  Subjective:  Patient ID: Randall Best, male    DOB: Mar 23, 1951,   MRN: [id number]  No chief complaint on file.   73 y.o. male presents for concern of possible foreign body. Relates he has been moving and last week noticed something in his shoe. Does mention a paper clip or potential glass that got in his shoe and has been irritated the bottom of his right foot. He was seen in urgent care and relates something was pulled out. He was given antibiotics. Relates improvement but still has some tenderness and was advised to come here to be seen . Denies any other pedal complaints. Denies n/v/f/c.   Past Medical History:  Diagnosis Date   Asthma    as child   Atrial fibrillation, rapid (HCC) 12/08/2017   Diagnosed during colonoscopy -presumably new onset   BBB (bundle branch block)    GERD (gastroesophageal reflux disease)    use meds prn   Glaucoma    Headache    remote h/o migraines, none in years   Hyperlipidemia    NICM (nonischemic cardiomyopathy) (HCC) 04/2019   Echo 05/2019 - EF reduced to <20%.  Has permanent Afib & LBBB (normal Coronaries on Cath). -->  After cardioversion and titration medications, EF up to 35-40% with  global hypokinesis..   Ringing in ears    bilateral   Skin lesion    "pre skin cancer"   Thoracic aortic aneurysm (HCC) 10/20/2019   Ascending thoracic aortic estimated 44 mm on echo   TIA (transient ischemic attack) 01/13/2018   Ventral hernia     Objective:  Physical Exam: Vascular: DP/PT pulses 2/4 bilateral. CFT <3 seconds. Normal hair growth on digits. No edema.  Skin. No lacerations or abrasions bilateral feet. Plantar right medial arch puncture wound area noted. Small piece of potential class about 0.05 cm  Musculoskeletal: MMT 5/5 bilateral lower extremities in DF, PF, Inversion and Eversion. Deceased ROM in DF of ankle joint.  Neurological: Sensation intact to light touch.   Assessment:   1. Foreign body in right foot, initial encounter       Plan:  Patient was evaluated and treated and all questions answered. Foreign body right foot.  -Debridement as below. -Dressed with neosporin and bandaid, DSD. -No abx indicated.  -Discussed if any worsening redness, pain, fever or chills to call or may need to report to the emergency room. Patient expressed understanding.   Procedure: Excisional Debridement of foregin body  Rationale: Removal of foreign body  Anesthesia: none Type of Debridement: Exicision debridement  Tissue Removed: Non-viable soft tissue and small 0.05 cm piece of debris likely glass.  Depth of Debridement: subcutaneous tissue. Dressing: Dry, sterile, compression dressing. Disposition: Patient tolerated procedure well. Patient to return as needed if does not improve.   Return if symptoms worsen or fail to improve.   Louann Sjogren, DPM

## 2024-02-05 ENCOUNTER — Ambulatory Visit (INDEPENDENT_AMBULATORY_CARE_PROVIDER_SITE_OTHER): Payer: Medicare Other

## 2024-02-05 DIAGNOSIS — I420 Dilated cardiomyopathy: Secondary | ICD-10-CM | POA: Diagnosis not present

## 2024-02-05 LAB — CUP PACEART REMOTE DEVICE CHECK
Battery Remaining Longevity: 76 mo
Battery Voltage: 2.98 V
Brady Statistic AP VP Percent: 71.93 %
Brady Statistic AP VS Percent: 1.91 %
Brady Statistic AS VP Percent: 24.17 %
Brady Statistic AS VS Percent: 1.99 %
Brady Statistic RA Percent Paced: 74.18 %
Brady Statistic RV Percent Paced: 96.1 %
Date Time Interrogation Session: 20250327214148
Implantable Lead Connection Status: 753985
Implantable Lead Connection Status: 753985
Implantable Lead Connection Status: 753985
Implantable Lead Implant Date: 20210922
Implantable Lead Implant Date: 20211102
Implantable Lead Implant Date: 20211102
Implantable Lead Location: 753858
Implantable Lead Location: 753859
Implantable Lead Location: 753860
Implantable Lead Model: 3830
Implantable Lead Model: 4196
Implantable Lead Model: 5076
Implantable Pulse Generator Implant Date: 20210922
Lead Channel Impedance Value: 342 Ohm
Lead Channel Impedance Value: 342 Ohm
Lead Channel Impedance Value: 399 Ohm
Lead Channel Impedance Value: 437 Ohm
Lead Channel Impedance Value: 456 Ohm
Lead Channel Impedance Value: 475 Ohm
Lead Channel Impedance Value: 551 Ohm
Lead Channel Impedance Value: 646 Ohm
Lead Channel Impedance Value: 798 Ohm
Lead Channel Pacing Threshold Amplitude: 0.5 V
Lead Channel Pacing Threshold Amplitude: 1.125 V
Lead Channel Pacing Threshold Pulse Width: 0.4 ms
Lead Channel Pacing Threshold Pulse Width: 0.4 ms
Lead Channel Sensing Intrinsic Amplitude: 3.25 mV
Lead Channel Sensing Intrinsic Amplitude: 3.25 mV
Lead Channel Sensing Intrinsic Amplitude: 7.125 mV
Lead Channel Sensing Intrinsic Amplitude: 7.125 mV
Lead Channel Setting Pacing Amplitude: 1.5 V
Lead Channel Setting Pacing Amplitude: 2 V
Lead Channel Setting Pacing Amplitude: 2.5 V
Lead Channel Setting Pacing Pulse Width: 0.4 ms
Lead Channel Setting Pacing Pulse Width: 0.4 ms
Lead Channel Setting Sensing Sensitivity: 0.9 mV
Zone Setting Status: 755011

## 2024-02-12 ENCOUNTER — Encounter: Payer: Self-pay | Admitting: Internal Medicine

## 2024-03-07 ENCOUNTER — Ambulatory Visit: Payer: Medicare Other | Admitting: Nurse Practitioner

## 2024-03-15 NOTE — Progress Notes (Signed)
 Remote pacemaker transmission.

## 2024-03-17 ENCOUNTER — Encounter: Payer: Self-pay | Admitting: Nurse Practitioner

## 2024-03-17 ENCOUNTER — Ambulatory Visit: Attending: Nurse Practitioner | Admitting: Nurse Practitioner

## 2024-03-17 VITALS — BP 110/80 | HR 62 | Ht 69.0 in | Wt 222.0 lb

## 2024-03-17 DIAGNOSIS — I5042 Chronic combined systolic (congestive) and diastolic (congestive) heart failure: Secondary | ICD-10-CM

## 2024-03-17 DIAGNOSIS — I480 Paroxysmal atrial fibrillation: Secondary | ICD-10-CM | POA: Diagnosis not present

## 2024-03-17 DIAGNOSIS — E785 Hyperlipidemia, unspecified: Secondary | ICD-10-CM | POA: Diagnosis not present

## 2024-03-17 DIAGNOSIS — M79661 Pain in right lower leg: Secondary | ICD-10-CM

## 2024-03-17 DIAGNOSIS — I420 Dilated cardiomyopathy: Secondary | ICD-10-CM | POA: Diagnosis not present

## 2024-03-17 DIAGNOSIS — I7121 Aneurysm of the ascending aorta, without rupture: Secondary | ICD-10-CM

## 2024-03-17 DIAGNOSIS — M79662 Pain in left lower leg: Secondary | ICD-10-CM

## 2024-03-17 NOTE — Progress Notes (Signed)
 Office Visit    Patient Name: Randall Best Date of Encounter: 03/17/2024  Primary Care Provider:  Lorenza Romans, FNP Primary Cardiologist:  Randene Bustard, MD  Chief Complaint    73 year old male with a history of p paroxysmal atrial fibrillation, chronic combined systolic and diastolic heart failure, NICM, tachycardia bradycardia syndrome, s/p CRT-P in 2021, LBBB, hyperlipidemia, venous insufficiency, and obesity who presents for follow-up related to heart failure and atrial fibrillation.   Past Medical History    Past Medical History:  Diagnosis Date   Asthma    as child   Atrial fibrillation, rapid (HCC) 12/08/2017   Diagnosed during colonoscopy -presumably new onset   BBB (bundle branch block)    GERD (gastroesophageal reflux disease)    use meds prn   Glaucoma    Headache    remote h/o migraines, none in years   Hyperlipidemia    NICM (nonischemic cardiomyopathy) (HCC) 04/2019   Echo 05/2019 - EF reduced to <20%.  Has permanent Afib & LBBB (normal Coronaries on Cath). -->  After cardioversion and titration medications, EF up to 35-40% with  global hypokinesis..   Ringing in ears    bilateral   Skin lesion    "pre skin cancer"   Thoracic aortic aneurysm (HCC) 10/20/2019   Ascending thoracic aortic estimated 44 mm on echo   TIA (transient ischemic attack) 01/13/2018   Ventral hernia    Past Surgical History:  Procedure Laterality Date   BIV PACEMAKER INSERTION CRT-P N/A 08/01/2020   Procedure: BIV PACEMAKER INSERTION CRT-P;  Surgeon: Lei Pump, MD;  Location: [facility] INVASIVE CV LAB;  Service: Cardiovascular;  Laterality: N/A;   bump removed from Rt ear  2023   "slow moving cancer"   CARDIAC EVENT MONITOR  12/2016   showed persistent Afib -monitor return in early because of persistent A. fib.  Several episodes of severe RVR with rates in the 170s were noted.    CARDIOVERSION N/A 01/13/2018   Procedure: CARDIOVERSION;  Surgeon: Hazle Lites, MD;   Location: [facility] ENDOSCOPY;  Service: Cardiovascular;  Laterality: N/A; unsuccessful.-->  Post-cardioversion, the patient had left-sided facial numbness and weakness.  Ruled out for stroke.   CARDIOVERSION N/A 08/19/2019   Procedure: CARDIOVERSION;  Surgeon: Darlis Eisenmenger, MD;  Location: [facility] ENDOSCOPY;  Service: Cardiovascular;  Laterality: N/A;   INSERTION OF MESH N/A 07/22/2017   Procedure: INSERTION OF MESH;  Surgeon: Enid Harry, MD;  Location: [facility] OR;  Service: General;  Laterality: N/A;  BILATERAL TAP BLOCK   LEAD INSERTION N/A 09/11/2020   Procedure: LEAD INSERTION - LV LEAD;  Surgeon: Tammie Fall, MD;  Location: [facility] INVASIVE CV LAB;  Service: Cardiovascular;  Laterality: N/A;   MOLE REMOVAL     2; hx of pre skin cancer   NM MYOVIEW  LTD  12/2017   Shows reduced EF, but no evidence of ischemia or infarction.  EF 40 and 45%.  INTERMEDIATE RISK due to reduced function.  EF notably better on echo (50 and 55%)   RIGHT/LEFT HEART CATH AND CORONARY ANGIOGRAPHY N/A 07/14/2019   Procedure: RIGHT/LEFT HEART CATH AND CORONARY ANGIOGRAPHY;  Surgeon: Arleen Lacer, MD;  Location: [facility]VASIVE CV LAB;; Angiographically normal coronary arteries. RHC  - PCWP & LVEDP 17 mmHg.  CO-CI 4.92-2.34   TEE WITHOUT CARDIOVERSION N/A 01/13/2018   Procedure: TRANSESOPHAGEAL ECHOCARDIOGRAM (TEE) with cardioversion - ;  Surgeon: Hazle Lites, MD;  Location: [facility] ENDOSCOPY;  Service: Cardiovascular;  Laterality: N/A;  Mild  concentric LVH.  EF 55 to 60%.  No R WMA.  Mild ascending aortic dilation of 4.2 cm.  Dilated left atrium.  No thrombus.   TONSILLECTOMY     TRANSTHORACIC ECHOCARDIOGRAM  10/11/2019   EF up to 35-40%.-Moderate-severely decreased function.  "GR 1 DD ".  Normal atrial sizes.  Moderate ascending aorta-44 mL.  Relatively normal valves.   TRANSTHORACIC ECHOCARDIOGRAM  06/2019   a) 06/2019: Severely reduced  LV Fxn w/th diffuse  HK & and septal dyskinesis.  EF <20%.  Reduced RV fxn w/ mildly  elevated RVP--37 mmHg.  Mod R & Mild L Atrial dilation.  Asc Ao 42 mm; b) 10/2021:  EF 40%.  GR 1 DD.  Mild LVH.  Unable to assess PAP.  AoV sclerosis w/o stenosis.  Nl RAP.  Mild Asc Ao dilation.   VENTRAL HERNIA REPAIR N/A 07/22/2017   Procedure: OPEN VENTRAL HERNIA REPAIR WITH MESH ERAS PATHWAY;  Surgeon: Enid Harry, MD;  Location: [facility] OR;  Service: General;  Laterality: N/A;  BILATERAL TAP BLOCK    Allergies  Allergies  Allergen Reactions   Statins Other (See Comments)    Muscle pains, mouth sores   Zetia [Ezetimibe] Other (See Comments)    Broke out in sores/ muscle pain   Iodine Swelling and Rash    This is with topical betadine only.  No issues, per patient, with CT contrast. 10/16/21 J. Lohr, RN   Latex Rash    Bandages not gloves   Lipitor [Atorvastatin] Other (See Comments)    Knee pain   Livalo [Pitavastatin] Other (See Comments)    Muscle pain   Spironolactone  Other (See Comments)    Soreness   Vaseline [Petrolatum] Rash    Break out   Zocor [Simvastatin] Other (See Comments)    myalgias     Labs/Other Studies Reviewed    The following studies were reviewed today:  Cardiac Studies & Procedures   ______________________________________________________________________________________________ CARDIAC CATHETERIZATION  CARDIAC CATHETERIZATION 07/14/2019  Conclusion  Angiographically normal coronary arteries  ---------------------  There is severe left ventricular systolic dysfunction. The left ventricular ejection fraction is less than 25% by visual estimate by visual estimation  Relatively compensated right heart cath pressures with PCWP and LVEDP of 17 mmHg.  Cardiac Output-Index Albertina Hugger) 4.92-2.34  SUMMARY  Nonischemic cardiomyopathy with essentially normal coronary arteries  Relatively compensated CHF with cardiac output 4.92, index 2.34; PCWP and LVEDP ~17 mmHg  RECOMMENDATIONS  Discharge home after bed rest.  With borderline blood pressures,  no room to start ARB or Entresto  at this point.  We will initiate 40 mg daily Lasix  with twice daily on Tuesdays and Thursdays (he will take 40 twice daily on Saturday, 07/16/2019)  Restart Xarelto  tonight 07/14/2019  Restart digoxin  a.m. 07/15/2019  Patient will be referred to Dr. Agatha Horsfall (08/02/2019, 11 AM -[facility], [facility] office) -plan to discuss antiarrhythmic control of A. fib versus potential ablation given significant drop in EF over the last 18 months being in A. Fib.    Randene Bustard, MD  Findings Coronary Findings Diagnostic  Dominance: Right  Left Main Vessel was injected. Vessel is large. Vessel is angiographically normal.  Left Anterior Descending Vessel was injected. Vessel is normal in caliber. Vessel is angiographically normal.  First Diagonal Branch Vessel was injected. Vessel is normal in size. Vessel is large in size Vessel is angiographically normal.  Lateral First Diagonal Branch Vessel was injected. Vessel is small in size. Vessel is angiographically normal.  First Septal Branch Vessel  was injected. Vessel is small in size. Vessel is angiographically normal.  Second Diagonal Branch Vessel was injected. Vessel is small in size. Vessel is angiographically normal.  Third Diagonal Branch Vessel was injected. Vessel is small in size. Vessel is angiographically normal.  Left Circumflex Vessel was injected. Vessel is large. Vessel is angiographically normal.  First Obtuse Marginal Branch Vessel was injected. Vessel is normal in size. Vessel is large in size Vessel is angiographically normal.  Second Obtuse Marginal Branch Vessel was injected. Vessel is small in size. Vessel is angiographically normal.  Left Posterior Atrioventricular Artery Vessel was injected. Vessel is small in size. Vessel is angiographically normal.  Right Coronary Artery Vessel was injected. Vessel is normal in caliber and large. Vessel is angiographically  normal.  Right Ventricular Branch Vessel was injected. Vessel is small in size. Vessel is angiographically normal.  First Right Posterolateral Branch Vessel was injected. Vessel is small in size. Vessel is angiographically normal.  Intervention  No interventions have been documented.   STRESS TESTS  MYOCARDIAL PERFUSION IMAGING 12/18/2017  Narrative  The left ventricular ejection fraction is moderately decreased (30-44%).  Nuclear stress EF: 43%.  There was no ST segment deviation noted during stress.  This is an intermediate risk study due to reduced systolic function.  No ischemia.   ECHOCARDIOGRAM  ECHOCARDIOGRAM COMPLETE 10/23/2021  Narrative ECHOCARDIOGRAM REPORT    Patient Name:   DENTON LAMARCHE Date of Exam: 10/23/2021 Medical Rec #:  [id number]         Height:       69.0 in Accession #:    [id number]        Weight:       240.0 lb Date of Birth:  1951-01-08          BSA:          2.232 m Patient Age:    70 years          BP:           122/82 mmHg Patient Gender: M                 HR:           78 bpm. Exam Location:  [address]  Procedure: 2D Echo, Cardiac Doppler and Color Doppler  Indications:    I42.9 Cardiomyopathy (unspecified); I48.91* Unspecified atrial fibrillation  History:        Patient has prior history of Echocardiogram examinations, most recent 10/11/2019. CHF, TIA and COPD, Signs/Symptoms:Chest Pain; Risk Factors:Hypertension and Dyslipidemia. NICM. Bundle branch block. Tachycardia-Bradycardia syndrome. Asthma. Right lower extremity edema. Thoracic aortic aneurysm. Obesity.  Sonographer:    Mylinda Asa RCS Referring Phys: [phone] DAVID W HARDING  IMPRESSIONS   1. Left ventricular ejection fraction, by estimation, is 40%. The left ventricle has mild to moderately decreased function. The left ventricle demonstrates global hypokinesis. There is mild left ventricular hypertrophy. Left ventricular diastolic parameters are consistent  with Grade I diastolic dysfunction (impaired relaxation). 2. Right ventricular systolic function is mildly reduced. The right ventricular size is mildly enlarged. Tricuspid regurgitation signal is inadequate for assessing PA pressure. 3. The mitral valve is normal in structure. No evidence of mitral valve regurgitation. No evidence of mitral stenosis. 4. The aortic valve is tricuspid. Aortic valve regurgitation is not visualized. Aortic valve sclerosis/calcification is present, without any evidence of aortic stenosis. 5. Aortic dilatation noted. There is mild dilatation of the ascending aorta, measuring 42 mm. 6. The inferior vena cava is normal in  size with greater than 50% respiratory variability, suggesting right atrial pressure of 3 mmHg.  FINDINGS Left Ventricle: Left ventricular ejection fraction, by estimation, is 40%. The left ventricle has mild to moderately decreased function. The left ventricle demonstrates global hypokinesis. The left ventricular internal cavity size was normal in size. There is mild left ventricular hypertrophy. Left ventricular diastolic parameters are consistent with Grade I diastolic dysfunction (impaired relaxation).  Right Ventricle: The right ventricular size is mildly enlarged. No increase in right ventricular wall thickness. Right ventricular systolic function is mildly reduced. Tricuspid regurgitation signal is inadequate for assessing PA pressure.  Left Atrium: Left atrial size was normal in size.  Right Atrium: Right atrial size was normal in size.  Pericardium: There is no evidence of pericardial effusion.  Mitral Valve: The mitral valve is normal in structure. There is mild calcification of the mitral valve leaflet(s). No evidence of mitral valve regurgitation. No evidence of mitral valve stenosis.  Tricuspid Valve: The tricuspid valve is normal in structure. Tricuspid valve regurgitation is not demonstrated.  Aortic Valve: The aortic valve is  tricuspid. Aortic valve regurgitation is not visualized. Aortic valve sclerosis/calcification is present, without any evidence of aortic stenosis.  Pulmonic Valve: The pulmonic valve was normal in structure. Pulmonic valve regurgitation is trivial.  Aorta: Aortic dilatation noted. There is mild dilatation of the ascending aorta, measuring 42 mm.  Venous: The inferior vena cava is normal in size with greater than 50% respiratory variability, suggesting right atrial pressure of 3 mmHg.  IAS/Shunts: No atrial level shunt detected by color flow Doppler.  Additional Comments: A device lead is visualized in the right ventricle.   LEFT VENTRICLE PLAX 2D LVIDd:         4.70 cm   Diastology LVIDs:         2.60 cm   LV e' medial:    7.78 cm/s LV PW:         1.05 cm   LV E/e' medial:  6.1 LV IVS:        1.25 cm   LV e' lateral:   12.40 cm/s LVOT diam:     2.05 cm   LV E/e' lateral: 3.9 LV SV:         69 LV SV Index:   31 LVOT Area:     3.30 cm   RIGHT VENTRICLE RV Basal diam:  3.00 cm RV S prime:     8.60 cm/s TAPSE (M-mode): 2.1 cm  LEFT ATRIUM             Index        RIGHT ATRIUM           Index LA diam:        3.30 cm 1.48 cm/m   RA Area:     17.80 cm LA Vol (A2C):   62.4 ml 27.95 ml/m  RA Volume:   45.50 ml  20.38 ml/m LA Vol (A4C):   54.7 ml 24.50 ml/m LA Biplane Vol: 58.8 ml 26.34 ml/m AORTIC VALVE LVOT Vmax:   93.50 cm/s LVOT Vmean:  61.400 cm/s LVOT VTI:    0.209 m  AORTA Ao Root diam: 4.00 cm Ao Asc diam:  4.30 cm  MITRAL VALVE MV Area (PHT): 2.50 cm    SHUNTS MV Decel Time: 303 msec    Systemic VTI:  0.21 m MV E velocity: 47.80 cm/s  Systemic Diam: 2.05 cm MV A velocity: 73.90 cm/s MV E/A ratio:  0.65  [employer]  signed by Archer Bear Signature Date/Time: 10/23/2021/1:14:59 PM    Final   TEE  ECHO TEE 01/13/2018  Narrative ** *[employer]* [address] [place], [place]  [address] [phone]  ------------------------------------------------------------------- Transesophageal Echocardiography with Cardioversion  Patient:    Tacorey, Cotrone MR #:       [id number] Study Date: 01/13/2018 Gender:     M Age:        66 Height:     177.8 cm Weight:     97.3 kg BSA:        2.22 m^2 Pt. Status: Room:  ADMITTING    Dinah Franco MD ATTENDING    Dinah Franco MD PERFORMING   Dinah Franco MD ORDERING     Randene Bustard, MD REFERRING    Randene Bustard, MD SONOGRAPHER  Peg Bouton  cc:  ------------------------------------------------------------------- LV EF: 55% -   60%  ------------------------------------------------------------------- Indications:      Atrial fibrillation - 427.31.  ------------------------------------------------------------------- History:   PMH:  Exertional dyspnea  Chest pain.  Atrial fibrillation.  ------------------------------------------------------------------- Study Conclusions  - Left ventricle: The cavity size was normal. There was mild concentric hypertrophy. Systolic function was normal. The estimated ejection fraction was in the range of 55% to 60%. Wall motion was normal; there were no regional wall motion abnormalities. No evidence of thrombus. - Aortic valve: No evidence of vegetation. - Aorta: Dilated ascending aorta to 4.2 cm. - Mitral valve: There was trivial regurgitation. - Left atrium: The atrium was dilated. No evidence of thrombus in the atrial cavity or appendage. - Right atrium: No evidence of thrombus in the atrial cavity or appendage. - Atrial septum: No defect or patent foramen ovale was identified.  Impressions:  - Unsuccessful cardioversion. No cardiac source of emboli was indentified.  ------------------------------------------------------------------- Study data:   Study status:  Routine.  Consent:  The risks, benefits, and alternatives to the procedure were explained to  the patient and informed consent was obtained.  Procedure:  The patient reported no pain pre or post test. Initial setup. The patient was brought to the laboratory in the fasting state. A baseline ECG was recorded. Intravenous access was obtained. Surface ECG leads and pulse oximetric signals were monitored. Self-adhesive anterior-posterior defibrillation pads were applied. Sedation. Moderate sedation was administered during cardioversion by cardiology staff. Transesophageal echocardiography. Topical anesthesia was obtained using viscous lidocaine . An adult multiplane transesophageal probe was inserted by the attending cardiologistwithout difficulty. Image quality was excellent. Images were captured in a quad screen format to simplify data comparison. No intracardiac thrombus was identified. . The rhythm was successfully converted from atrial fibrillation to normal sinus rhythm.  Study completion:  All IVs inserted during the procedure were removed. The patient tolerated the procedure well. There were no complications.          Transesophageal echocardiography with cardioversion.  2D and intravenous contrast injection.  Birthdate: Patient birthdate: 08-Mar-1951.  Age:  Patient is 73 yr old.  Sex: Gender: male.    BMI: 30.8 kg/m^2.  Blood pressure:     131/97 Patient status:  Inpatient.  Study date:  Study date: 01/13/2018. Study time: 01:15 PM.  Location:  Endoscopy.  -------------------------------------------------------------------  ------------------------------------------------------------------- Left ventricle:  The cavity size was normal. There was mild concentric hypertrophy. Systolic function was normal. The estimated ejection fraction was in the range of 55% to 60%. Wall motion was normal; there were no regional wall motion abnormalities.  No evidence of thrombus.  ------------------------------------------------------------------- Aortic valve:   Structurally  normal  valve. Trileaflet. Cusp separation was normal.  No evidence of vegetation.  Doppler:  There was no regurgitation.  ------------------------------------------------------------------- Aorta:  Dilated ascending aorta to 4.2 cm.  ------------------------------------------------------------------- Mitral valve:   Doppler:  There was trivial regurgitation.  ------------------------------------------------------------------- Left atrium:  The atrium was dilated.  No evidence of thrombus in the atrial cavity or appendage.  ------------------------------------------------------------------- Atrial septum:  No defect or patent foramen ovale was identified.  ------------------------------------------------------------------- Pulmonary veins:  No anomaly.  ------------------------------------------------------------------- Right ventricle:  The cavity size was normal. Wall thickness was normal. Systolic function was normal.  ------------------------------------------------------------------- Pulmonic valve:    Doppler:  There was trivial regurgitation.  ------------------------------------------------------------------- Tricuspid valve:   Doppler:  There was trivial regurgitation.  ------------------------------------------------------------------- Pulmonary artery:   The main pulmonary artery was normal-sized.  ------------------------------------------------------------------- Right atrium:  The atrium was normal in size.  No evidence of thrombus in the atrial cavity or appendage.  ------------------------------------------------------------------- Pericardium:  There was no pericardial effusion.  ------------------------------------------------------------------- Post procedure conclusions Ascending Aorta:  - Dilated ascending aorta to 4.2 cm.  ------------------------------------------------------------------- Measurements  Aorta                          Value Ascending  aorta ID, A-P, S     42    mm  Legend: (L)  and  (H)  mark values outside specified reference range.  ------------------------------------------------------------------- Prepared and Electronically Authenticated by  Dinah Franco MD 2019-03-06T14:41:01  MONITORS  LONG TERM MONITOR (3-14 DAYS) 12/08/2019  Narrative  Relatively normal monitor results  Predominantly NSR with 1st Deg AV conduction delay & LBBB: Min HR 38 bpm, Max 100 bpm with avg HR 58 bpm.  Rare, isolated PACs noted (<1%) with occasional Triplets.  Rare isolated PVCs (<1%) withno couplets/triplets or bigeminy pattern'  No arrythmias - bradycardia or tachycardia wtih no pauses.  Relatively normal with no obvious arrhythmias.  Baseline heart rate and resting heart is somewhat slow.  On occasion, able to increase heart rate up to 100 bpm, but not further.  I am a little worried about chronotropic incompetence. ->  Plan: Need to wean off of carvedilol -take 1 tablet once daily for 1 week and then discontinue. Monitor blood pressure control as this may allow us  to titrate other medications  Randene Bustard, MD       ______________________________________________________________________________________________     Recent Labs: 08/28/2023: ALT 29; BUN 22; Creatinine, Ser 1.16; Potassium 4.1; Sodium 142; TSH 1.340  Recent Lipid Panel    Component Value Date/Time   CHOL 180 08/28/2023 1005   TRIG 221 (H) 08/28/2023 1005   HDL 46 08/28/2023 1005   CHOLHDL 3.9 08/28/2023 1005   CHOLHDL 4.8 01/14/2018 0631   VLDL 19 01/14/2018 0631   LDLCALC 96 08/28/2023 1005    History of Present Illness    73 year old male with the above past medical history including paroxysmal atrial fibrillation, chronic combined systolic and diastolic heart failure, NICM, tachycardia bradycardia syndrome, s/p CRT-P in 2021, LBBB, hyperlipidemia, venous insufficiency, and obesity.   He has a history of chronic diastolic heart failure,  prior EF as low as less than 20%.  R/LHC in 07/2019 showed normal coronary arteries, NICM.  Most recent echocardiogram in 10/2021 showed EF 40%, mild to moderately reduced LV systolic function, mild LVH, G1 DD mildly reduced RV systolic function.  He is status post [employer] CRT-P in 07/2020.  Follows with EP.  He has a history of PAF on amiodarone  and  Xarelto .  He has a history of gynecomastia on spironolactone , improved with transition to Eplerenone .  He was last in the office on 08/28/2023 and was stable from a cardiac standpoint.  He was maintaining sinus rhythm.  He reported taking Praluent  1 time per month due to cost.  He self-uptitrated his Crestor  to 5 mg daily and was tolerating this well.  He presents today in follow-up. Since his last visit he has been stable from a cardiac standpoint. He recently sold his home in [place], [place]  and is moving into a townhome in [place],   to be closer to his daughter and granddaughter.  He has noticed rare intermittent left shoulder discomfort, he feels that this is likely musculoskeletal as he has been moving and lifting heavy boxes and furniture.  He stays active, he is walking regularly.  He had 1 episode of dizziness approximately 6 to 8 weeks ago, he felt he was dehydrated at the time.  He denies any chest pain, palpitations, presyncope, syncope, dyspnea, edema, PND, orthopnea, weight gain.  Overall, he reports feeling well.  Home Medications    Current Outpatient Medications  Medication Sig Dispense Refill   acetaminophen  (TYLENOL ) 500 MG tablet Take 500 mg by mouth every 6 (six) hours as needed for moderate pain.      amiodarone  (PACERONE ) 200 MG tablet TAKE 1 TABLET BY MOUTH DAILY 30 tablet 9   carvedilol  (COREG ) 3.125 MG tablet TAKE 1 TABLET BY MOUTH TWICE  DAILY 60 tablet 9   Cholecalciferol (VITAMIN D) 50 MCG (2000 UT) tablet Take 2,000 Units by mouth daily.     digoxin  (LANOXIN ) 0.125 MG tablet TAKE 1 TABLET BY MOUTH DAILY  100 tablet 2   DULoxetine (CYMBALTA) 30 MG capsule Take 30 mg by mouth daily.     ENTRESTO  24-26 MG TAKE 1 TABLET BY MOUTH EVERY DAY IN THE MORNING 90 tablet 3   EPLERENONE  PO Take by mouth daily.     furosemide  (LASIX ) 80 MG tablet TAKE 1 TABLET BY MOUTH TWICE  DAILY MAY TAKE 1 ADDITIONAL  TABLET DAILY AS NEEDED FOR  INCREASED SWELLING OR SHORTNESS  OF BREATH 135 tablet 7   gabapentin  (NEURONTIN ) 300 MG capsule as needed. "Nerves and tingling in legs when sleeping."     hydrocortisone  (ANUSOL -HC) 25 MG suppository PLACE 1 SUPPOSITORY (25 MG TOTAL) RECTALLY AT BEDTIME. *NOT COVERED* 24 suppository 1   LORazepam (ATIVAN) 0.5 MG tablet Take 0.5 mg by mouth daily as needed (severe dizziness).     meclizine (ANTIVERT) 25 MG tablet Take 25 mg by mouth 3 (three) times daily as needed for dizziness.     metolazone  (ZAROXOLYN ) 2.5 MG tablet TAKE 1 TABLET BY MOUTH EVERY  MONDAY, WEDNESDAY, AND FRIDAY.  30 MINUTES PRIOR TO YOUR  FUROSEMIDE  DOSE 43 tablet 1   Multiple Vitamins-Minerals (ULTRA MENS PACK PO) Take 1 tablet by mouth daily.     mupirocin ointment (BACTROBAN) 2 % Apply 1 application topically daily.     Omega-3 Fatty Acids (FISH OIL) 1200 MG CAPS Take 1,200 mg by mouth every other day.     omeprazole  (PRILOSEC) 20 MG capsule TAKE 1 CAPSULE BY MOUTH DAILY 100 capsule 3   potassium chloride  SA (KLOR-CON  M) 20 MEQ tablet TAKE 1 TABLET BY MOUTH DAILY 90 tablet 3   PRALUENT  150 MG/ML SOAJ INJECT 150MG  SUBCUTANEOUSLY  EVERY 2 WEEKS 6 mL 3   rivaroxaban  (XARELTO ) 20 MG TABS tablet Take 1 tablet (20 mg total) by mouth daily  with supper. 7 tablet 0   rosuvastatin  (CRESTOR ) 5 MG tablet TAKE 1 TABLET BY MOUTH DAILY 100 tablet 2   sacubitril-valsartan (ENTRESTO ) 49-51 MG Take 1 tablet by mouth at bedtime. ,per Dr Addie Holstein  this is in additional dose than his morning dose. 90 tablet 3   SYMBICORT  160-4.5 MCG/ACT inhaler INHALE 2 PUFFS INTO THE LUNGS IN THE MORNING AND AT BEDTIME. (Patient taking differently: as  needed.) 30.6 each 1   tizanidine (ZANAFLEX) 2 MG capsule Take 2 mg by mouth at bedtime.     triamcinolone cream (KENALOG) 0.1 % Apply 1 application topically as needed.     nitroGLYCERIN  (NITROSTAT ) 0.4 MG SL tablet Place 1 tablet (0.4 mg total) under the tongue every 5 (five) minutes as needed for chest pain. 25 tablet 3   No current facility-administered medications for this visit.     Review of Systems    He denies chest pain, palpitations, dyspnea, pnd, orthopnea, n, v, dizziness, syncope, edema, weight gain, or early satiety. All other systems reviewed and are otherwise negative except as noted above.   Physical Exam    VS:  BP 110/80   Pulse 62   Ht 5\' 9"  (1.753 m)   Wt 222 lb (100.7 kg)   SpO2 96%   BMI 32.78 kg/m  GEN: Well nourished, well developed, in no acute distress. HEENT: normal. Neck: Supple, no JVD, carotid bruits, or masses. Cardiac: RRR, no murmurs, rubs, or gallops. No clubbing, cyanosis, edema.  Radials/DP/PT 2+ and equal bilaterally.  Respiratory:  Respirations regular and unlabored, clear to auscultation bilaterally. GI: Soft, nontender, nondistended, BS + x 4. MS: no deformity or atrophy. Skin: warm and dry, no rash. Neuro:  Strength and sensation are intact. Psych: Normal affect.  Accessory Clinical Findings    ECG personally reviewed by me today - EKG Interpretation Date/Time:  Thursday Mar 17 2024 15:24:57 EDT Ventricular Rate:  62 PR Interval:  176 QRS Duration:  182 QT Interval:  498 QTC Calculation: 505 R Axis:   151  Text Interpretation: AV dual-paced rhythm with occasional ventricular-paced complexes Biventricular pacemaker detected When compared with ECG of 28-Aug-2023 09:01, No significant change was found Confirmed by Marlana Silvan ([id number]) on 03/17/2024 3:51:06 PM  - no acute changes.   Lab Results  Component Value Date   WBC 8.7 02/13/2023   HGB 14.7 02/13/2023   HCT 44.3 02/13/2023   MCV 91 02/13/2023   PLT 267 02/13/2023   Lab  Results  Component Value Date   CREATININE 1.16 08/28/2023   BUN 22 08/28/2023   NA 142 08/28/2023   K 4.1 08/28/2023   CL 97 08/28/2023   CO2 26 08/28/2023   Lab Results  Component Value Date   ALT 29 08/28/2023   AST 20 08/28/2023   ALKPHOS 66 08/28/2023   BILITOT 0.3 08/28/2023   Lab Results  Component Value Date   CHOL 180 08/28/2023   HDL 46 08/28/2023   LDLCALC 96 08/28/2023   TRIG 221 (H) 08/28/2023   CHOLHDL 3.9 08/28/2023    Lab Results  Component Value Date   HGBA1C 5.6 01/14/2018    Assessment & Plan   1. 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 S/p CRT-P in 2021.  Most recent device interrogation in 01/2024 was stable.  Euvolemic and well compensated on exam. Continue carvedilol , digoxin ,  Entresto , metolazone , and Lasix .     2. Persistent atrial fibrillation: Appears to be maintaining sinus rhythm.  Most recent CT of the chest shows stable 4.5 cm aneurysm of the ascending aorta, no concerning lung changes.  He has had his annual eye exam.  Will update CMET, TSH, CBC for amiodarone  monitoring.  Continue amiodarone , digoxin , carvedilol , Xarelto .   3.  Ascending aortic aneurysm: CT of the chest in 08/2023 showed stable 4.5 cm aneurysm of ascending aorta.  Will review with Dr. Addie Holstein preferred method of imaging (CT or MRA) for ongoing monitoring.  Plan for repeat imaging prior to follow-up visit in 08/2024.  BP well-controlled.  4. Bilateral leg pain: He notes an increase in intermittent burning/bilateral leg pain.  ABIs in 2024 were normal.   5. Hyperlipidemia: LDL was 96 in 08/2023.  Will repeat fasting lipid panel, CMET.  Continue Crestor , Praluent .   6. Disposition: Follow-up in 08/2024 with Dr. Addie Holstein, sooner if needed.        Jude Norton, NP 03/17/2024, 3:51 PM

## 2024-03-17 NOTE — Patient Instructions (Addendum)
 Medication Instructions:  Your physician recommends that you continue on your current medications as directed. Please refer to the Current Medication list given to you today.   *If you need a refill on your cardiac medications before your next appointment, please call your pharmacy*  Lab Work: CBC, CMET, TSH, Lipid panel, TSH   Testing/Procedures: NONE ordered at this time of appointment   Follow-Up: At [facility], you and your health needs are our priority.  As part of our continuing mission to provide you with exceptional heart care, our providers are all part of one team.  This team includes your primary Cardiologist (physician) and Advanced Practice Providers or APPs (Physician Assistants and Nurse Practitioners) who all work together to provide you with the care you need, when you need it.  Your next appointment:    October 2025  Provider:   Randene Bustard, MD    We recommend signing up for the patient portal called "MyChart".  Sign up information is provided on this After Visit Summary.  MyChart is used to connect with patients for Virtual Visits (Telemedicine).  Patients are able to view lab/test results, encounter notes, upcoming appointments, etc.  Non-urgent messages can be sent to your provider as well.   To learn more about what you can do with MyChart, go to [email or web address].

## 2024-03-18 LAB — COMPREHENSIVE METABOLIC PANEL WITH GFR
ALT: 29 IU/L (ref 0–44)
AST: 30 IU/L (ref 0–40)
Albumin: 4.8 g/dL (ref 3.8–4.8)
Alkaline Phosphatase: 67 IU/L (ref 44–121)
BUN/Creatinine Ratio: 19 (ref 10–24)
BUN: 19 mg/dL (ref 8–27)
Bilirubin Total: 0.7 mg/dL (ref 0.0–1.2)
CO2: 23 mmol/L (ref 20–29)
Calcium: 9.7 mg/dL (ref 8.6–10.2)
Chloride: 103 mmol/L (ref 96–106)
Creatinine, Ser: 1.02 mg/dL (ref 0.76–1.27)
Globulin, Total: 2.3 g/dL (ref 1.5–4.5)
Glucose: 87 mg/dL (ref 70–99)
Potassium: 3.7 mmol/L (ref 3.5–5.2)
Sodium: 142 mmol/L (ref 134–144)
Total Protein: 7.1 g/dL (ref 6.0–8.5)
eGFR: 78 mL/min/{1.73_m2} (ref >59)

## 2024-03-18 LAB — CBC
Hematocrit: 41.1 % (ref 37.5–51.0)
Hemoglobin: 13.5 g/dL (ref 13.0–17.7)
MCH: 29.7 pg (ref 26.6–33.0)
MCHC: 32.8 g/dL (ref 31.5–35.7)
MCV: 90 fL (ref 79–97)
Platelets: 168 10*3/uL (ref 150–450)
RBC: 4.55 x10E6/uL (ref 4.14–5.80)
RDW: 13 % (ref 11.6–15.4)
WBC: 7.8 10*3/uL (ref 3.4–10.8)

## 2024-03-18 LAB — LIPID PANEL
Chol/HDL Ratio: 5.5 ratio — ABNORMAL HIGH (ref 0.0–5.0)
Cholesterol, Total: 205 mg/dL — ABNORMAL HIGH (ref 100–199)
HDL: 37 mg/dL — ABNORMAL LOW (ref >39)
LDL Chol Calc (NIH): 137 mg/dL — ABNORMAL HIGH (ref 0–99)
Triglycerides: 170 mg/dL — ABNORMAL HIGH (ref 0–149)
VLDL Cholesterol Cal: 31 mg/dL (ref 5–40)

## 2024-03-18 LAB — TSH: TSH: 1.09 u[IU]/mL (ref 0.450–4.500)

## 2024-03-21 ENCOUNTER — Other Ambulatory Visit: Payer: Self-pay

## 2024-03-21 DIAGNOSIS — I7121 Aneurysm of the ascending aorta, without rupture: Secondary | ICD-10-CM

## 2024-03-21 NOTE — Progress Notes (Signed)
 Noted. MR chest/aorta order placed for [facility].

## 2024-03-22 ENCOUNTER — Ambulatory Visit: Payer: Self-pay

## 2024-03-22 DIAGNOSIS — Z85828 Personal history of other malignant neoplasm of skin: Secondary | ICD-10-CM | POA: Diagnosis not present

## 2024-03-22 DIAGNOSIS — L570 Actinic keratosis: Secondary | ICD-10-CM | POA: Diagnosis not present

## 2024-03-22 DIAGNOSIS — D692 Other nonthrombocytopenic purpura: Secondary | ICD-10-CM | POA: Diagnosis not present

## 2024-03-22 DIAGNOSIS — D225 Melanocytic nevi of trunk: Secondary | ICD-10-CM | POA: Diagnosis not present

## 2024-03-22 DIAGNOSIS — I8311 Varicose veins of right lower extremity with inflammation: Secondary | ICD-10-CM | POA: Diagnosis not present

## 2024-03-22 DIAGNOSIS — L218 Other seborrheic dermatitis: Secondary | ICD-10-CM | POA: Diagnosis not present

## 2024-03-22 DIAGNOSIS — I872 Venous insufficiency (chronic) (peripheral): Secondary | ICD-10-CM | POA: Diagnosis not present

## 2024-03-22 DIAGNOSIS — I8312 Varicose veins of left lower extremity with inflammation: Secondary | ICD-10-CM | POA: Diagnosis not present

## 2024-03-22 DIAGNOSIS — L821 Other seborrheic keratosis: Secondary | ICD-10-CM | POA: Diagnosis not present

## 2024-03-22 NOTE — Telephone Encounter (Signed)
 Lmom to discuss lab results and recommendations. Waiting on a return call.

## 2024-03-23 ENCOUNTER — Other Ambulatory Visit: Payer: Self-pay

## 2024-03-23 ENCOUNTER — Other Ambulatory Visit: Payer: Self-pay | Admitting: Internal Medicine

## 2024-03-23 ENCOUNTER — Other Ambulatory Visit: Payer: Self-pay | Admitting: Cardiology

## 2024-03-23 MED ORDER — RIVAROXABAN 20 MG PO TABS: 20.0000 mg | ORAL_TABLET | Freq: Every day | ORAL | 1 refills | Status: DC

## 2024-03-23 NOTE — Telephone Encounter (Signed)
 Prescription refill request for Xarelto  received.  Indication:afib Last office visit:5/25 Weight:100.7  kg Age:73 Scr:1.02  5/25 CrCl:93.24  ml/min  Prescription refilled

## 2024-03-25 ENCOUNTER — Telehealth: Payer: Self-pay | Admitting: Pharmacy Technician

## 2024-03-25 ENCOUNTER — Other Ambulatory Visit (HOSPITAL_COMMUNITY): Payer: Self-pay

## 2024-03-25 ENCOUNTER — Telehealth: Payer: Self-pay | Admitting: Pharmacist

## 2024-03-25 NOTE — Telephone Encounter (Signed)
 Pharmacy Patient Advocate Encounter   Received notification from Pt Calls Messages that prior authorization for Praluent  is required/requested.   Insurance verification completed.   The patient is insured through [place] .   Per test claim: PA required; PA submitted to above mentioned insurance via CoverMyMeds Key/confirmation #/EOC [id number] Status is pending

## 2024-03-25 NOTE — Telephone Encounter (Signed)
 Pharmacy Patient Advocate Encounter  Received notification from [facility] that Prior Authorization for Praluent  has been APPROVED from 03/25/24 to 11/09/24. Ran test claim, Copay is $152.05- one month. This test claim was processed through [facility]- copay amounts may vary at other pharmacies due to pharmacy/plan contracts, or as the patient moves through the different stages of their insurance plan.   PA #/Case ID/Reference #: [id number]

## 2024-03-30 ENCOUNTER — Ambulatory Visit (HOSPITAL_COMMUNITY)
Admission: RE | Admit: 2024-03-30 | Discharge: 2024-03-30 | Disposition: A | Discharge status: Home / Self Care | Source: Ambulatory Visit | Attending: Nurse Practitioner | Admitting: Nurse Practitioner

## 2024-03-30 ENCOUNTER — Ambulatory Visit: Payer: Self-pay | Admitting: Nurse Practitioner

## 2024-03-30 DIAGNOSIS — I517 Cardiomegaly: Secondary | ICD-10-CM | POA: Diagnosis not present

## 2024-03-30 DIAGNOSIS — I7121 Aneurysm of the ascending aorta, without rupture: Secondary | ICD-10-CM | POA: Insufficient documentation

## 2024-03-30 MED ORDER — GADOBUTROL 1 MMOL/ML IV SOLN
10.0000 mL | Freq: Once | INTRAVENOUS | Status: AC | PRN
  Administered 2024-03-30: 10 mL via INTRAVENOUS

## 2024-03-30 NOTE — Telephone Encounter (Signed)
 PA approved see other encounter for more info

## 2024-03-31 NOTE — Telephone Encounter (Signed)
 Pt cancelled apt

## 2024-05-05 ENCOUNTER — Other Ambulatory Visit: Payer: Self-pay | Admitting: Cardiology

## 2024-05-06 ENCOUNTER — Ambulatory Visit (INDEPENDENT_AMBULATORY_CARE_PROVIDER_SITE_OTHER): Payer: Medicare Other

## 2024-05-06 DIAGNOSIS — I420 Dilated cardiomyopathy: Secondary | ICD-10-CM | POA: Diagnosis not present

## 2024-05-06 LAB — CUP PACEART REMOTE DEVICE CHECK
Battery Remaining Longevity: 75 mo
Battery Voltage: 2.98 V
Brady Statistic AP VP Percent: 78.01 %
Brady Statistic AP VS Percent: 2.25 %
Brady Statistic AS VP Percent: 16.4 %
Brady Statistic AS VS Percent: 3.33 %
Brady Statistic RA Percent Paced: 81.25 %
Brady Statistic RV Percent Paced: 94.42 %
Date Time Interrogation Session: 20250626201357
Implantable Lead Connection Status: 753985
Implantable Lead Connection Status: 753985
Implantable Lead Connection Status: 753985
Implantable Lead Implant Date: 20210922
Implantable Lead Implant Date: 20211102
Implantable Lead Implant Date: 20211102
Implantable Lead Location: 753858
Implantable Lead Location: 753859
Implantable Lead Location: 753860
Implantable Lead Model: 3830
Implantable Lead Model: 4196
Implantable Lead Model: 5076
Implantable Pulse Generator Implant Date: 20210922
Lead Channel Impedance Value: 323 Ohm
Lead Channel Impedance Value: 323 Ohm
Lead Channel Impedance Value: 380 Ohm
Lead Channel Impedance Value: 418 Ohm
Lead Channel Impedance Value: 456 Ohm
Lead Channel Impedance Value: 513 Ohm
Lead Channel Impedance Value: 627 Ohm
Lead Channel Impedance Value: 684 Ohm
Lead Channel Impedance Value: 760 Ohm
Lead Channel Pacing Threshold Amplitude: 0.375 V
Lead Channel Pacing Threshold Amplitude: 1.25 V
Lead Channel Pacing Threshold Pulse Width: 0.4 ms
Lead Channel Pacing Threshold Pulse Width: 0.4 ms
Lead Channel Sensing Intrinsic Amplitude: 2.375 mV
Lead Channel Sensing Intrinsic Amplitude: 2.375 mV
Lead Channel Sensing Intrinsic Amplitude: 6.5 mV
Lead Channel Sensing Intrinsic Amplitude: 6.5 mV
Lead Channel Setting Pacing Amplitude: 1.5 V
Lead Channel Setting Pacing Amplitude: 2 V
Lead Channel Setting Pacing Amplitude: 2.5 V
Lead Channel Setting Pacing Pulse Width: 0.4 ms
Lead Channel Setting Pacing Pulse Width: 0.4 ms
Lead Channel Setting Sensing Sensitivity: 0.9 mV
Zone Setting Status: 755011

## 2024-05-09 ENCOUNTER — Ambulatory Visit: Payer: Self-pay | Admitting: Internal Medicine

## 2024-05-31 ENCOUNTER — Other Ambulatory Visit: Payer: Self-pay | Admitting: Cardiology

## 2024-05-31 NOTE — Telephone Encounter (Signed)
 Prescription refill request for Xarelto  received.  Indication:afib Last office visit:5/25 Weight:100.7  kg Age:73 Scr:1.02  5/25 CrCl:93.24  ml/min  Prescription refilled

## 2024-06-11 ENCOUNTER — Other Ambulatory Visit: Payer: Self-pay | Admitting: Cardiology

## 2024-07-01 ENCOUNTER — Other Ambulatory Visit: Payer: Self-pay | Admitting: Cardiology

## 2024-07-01 DIAGNOSIS — E785 Hyperlipidemia, unspecified: Secondary | ICD-10-CM

## 2024-07-14 NOTE — Progress Notes (Signed)
 Remote pacemaker transmission.

## 2024-07-28 DIAGNOSIS — Z23 Encounter for immunization: Secondary | ICD-10-CM | POA: Diagnosis not present

## 2024-08-01 ENCOUNTER — Other Ambulatory Visit: Payer: Self-pay | Admitting: Cardiology

## 2024-08-05 ENCOUNTER — Ambulatory Visit: Payer: Medicare Other

## 2024-08-05 DIAGNOSIS — I5042 Chronic combined systolic (congestive) and diastolic (congestive) heart failure: Secondary | ICD-10-CM

## 2024-08-05 LAB — CUP PACEART REMOTE DEVICE CHECK
Battery Remaining Longevity: 71 mo
Battery Voltage: 2.97 V
Brady Statistic AP VP Percent: 77.44 %
Brady Statistic AP VS Percent: 2.03 %
Brady Statistic AS VP Percent: 18.27 %
Brady Statistic AS VS Percent: 2.27 %
Brady Statistic RA Percent Paced: 79.95 %
Brady Statistic RV Percent Paced: 95.7 %
Date Time Interrogation Session: 20250925215137
Implantable Lead Connection Status: 753985
Implantable Lead Connection Status: 753985
Implantable Lead Connection Status: 753985
Implantable Lead Implant Date: 20210922
Implantable Lead Implant Date: 20211102
Implantable Lead Implant Date: 20211102
Implantable Lead Location: 753858
Implantable Lead Location: 753859
Implantable Lead Location: 753860
Implantable Lead Model: 3830
Implantable Lead Model: 4196
Implantable Lead Model: 5076
Implantable Pulse Generator Implant Date: 20210922
Lead Channel Impedance Value: 361 Ohm
Lead Channel Impedance Value: 361 Ohm
Lead Channel Impedance Value: 418 Ohm
Lead Channel Impedance Value: 418 Ohm
Lead Channel Impedance Value: 513 Ohm
Lead Channel Impedance Value: 551 Ohm
Lead Channel Impedance Value: 646 Ohm
Lead Channel Impedance Value: 703 Ohm
Lead Channel Impedance Value: 798 Ohm
Lead Channel Pacing Threshold Amplitude: 0.375 V
Lead Channel Pacing Threshold Amplitude: 1.375 V
Lead Channel Pacing Threshold Pulse Width: 0.4 ms
Lead Channel Pacing Threshold Pulse Width: 0.4 ms
Lead Channel Sensing Intrinsic Amplitude: 2.625 mV
Lead Channel Sensing Intrinsic Amplitude: 2.625 mV
Lead Channel Sensing Intrinsic Amplitude: 6.5 mV
Lead Channel Sensing Intrinsic Amplitude: 6.5 mV
Lead Channel Setting Pacing Amplitude: 1.5 V
Lead Channel Setting Pacing Amplitude: 2 V
Lead Channel Setting Pacing Amplitude: 2.75 V
Lead Channel Setting Pacing Pulse Width: 0.4 ms
Lead Channel Setting Pacing Pulse Width: 0.4 ms
Lead Channel Setting Sensing Sensitivity: 0.9 mV
Zone Setting Status: 755011

## 2024-08-07 ENCOUNTER — Ambulatory Visit: Payer: Self-pay | Admitting: Internal Medicine

## 2024-08-08 NOTE — Progress Notes (Signed)
 Remote PPM Transmission

## 2024-10-08 ENCOUNTER — Other Ambulatory Visit: Payer: Self-pay | Admitting: Cardiology

## 2024-10-08 DIAGNOSIS — E785 Hyperlipidemia, unspecified: Secondary | ICD-10-CM

## 2024-10-27 ENCOUNTER — Other Ambulatory Visit: Payer: Self-pay | Admitting: Internal Medicine

## 2024-10-27 ENCOUNTER — Other Ambulatory Visit: Payer: Self-pay | Admitting: Cardiology

## 2024-10-27 DIAGNOSIS — I5043 Acute on chronic combined systolic (congestive) and diastolic (congestive) heart failure: Secondary | ICD-10-CM

## 2024-10-27 DIAGNOSIS — I5042 Chronic combined systolic (congestive) and diastolic (congestive) heart failure: Secondary | ICD-10-CM

## 2024-10-31 NOTE — Telephone Encounter (Signed)
 The chart says pt takes.  24-26 MG Tabs, TAKE 1 TABLET BY MOUTH EVERY DAY IN THE MORNING  49-51 MG Tabs, 1 tablet Oral Daily at bedtime, ,per Dr Anner  this is in additional dose than his morning dose.

## 2024-11-04 ENCOUNTER — Ambulatory Visit

## 2024-11-04 DIAGNOSIS — I5042 Chronic combined systolic (congestive) and diastolic (congestive) heart failure: Secondary | ICD-10-CM

## 2024-11-06 LAB — CUP PACEART REMOTE DEVICE CHECK
Battery Remaining Longevity: 66 mo
Battery Voltage: 2.97 V
Brady Statistic AP VP Percent: 75.82 %
Brady Statistic AP VS Percent: 1.11 %
Brady Statistic AS VP Percent: 21.28 %
Brady Statistic AS VS Percent: 1.79 %
Brady Statistic RA Percent Paced: 77.52 %
Brady Statistic RV Percent Paced: 97.1 %
Date Time Interrogation Session: 20251225222335
Implantable Lead Connection Status: 753985
Implantable Lead Connection Status: 753985
Implantable Lead Connection Status: 753985
Implantable Lead Implant Date: 20210922
Implantable Lead Implant Date: 20211102
Implantable Lead Implant Date: 20211102
Implantable Lead Location: 753858
Implantable Lead Location: 753859
Implantable Lead Location: 753860
Implantable Lead Model: 3830
Implantable Lead Model: 4196
Implantable Lead Model: 5076
Implantable Pulse Generator Implant Date: 20210922
Lead Channel Impedance Value: 323 Ohm
Lead Channel Impedance Value: 342 Ohm
Lead Channel Impedance Value: 380 Ohm
Lead Channel Impedance Value: 437 Ohm
Lead Channel Impedance Value: 494 Ohm
Lead Channel Impedance Value: 513 Ohm
Lead Channel Impedance Value: 627 Ohm
Lead Channel Impedance Value: 703 Ohm
Lead Channel Impedance Value: 779 Ohm
Lead Channel Pacing Threshold Amplitude: 0.5 V
Lead Channel Pacing Threshold Amplitude: 1.25 V
Lead Channel Pacing Threshold Pulse Width: 0.4 ms
Lead Channel Pacing Threshold Pulse Width: 0.4 ms
Lead Channel Sensing Intrinsic Amplitude: 2.625 mV
Lead Channel Sensing Intrinsic Amplitude: 2.625 mV
Lead Channel Sensing Intrinsic Amplitude: 7.75 mV
Lead Channel Sensing Intrinsic Amplitude: 7.75 mV
Lead Channel Setting Pacing Amplitude: 1.5 V
Lead Channel Setting Pacing Amplitude: 2 V
Lead Channel Setting Pacing Amplitude: 2.75 V
Lead Channel Setting Pacing Pulse Width: 0.4 ms
Lead Channel Setting Pacing Pulse Width: 0.4 ms
Lead Channel Setting Sensing Sensitivity: 0.9 mV
Zone Setting Status: 755011

## 2024-11-07 NOTE — Progress Notes (Signed)
 Remote PPM Transmission

## 2024-11-08 ENCOUNTER — Ambulatory Visit: Payer: Self-pay | Admitting: Internal Medicine

## 2025-02-03 ENCOUNTER — Encounter

## 2025-05-05 ENCOUNTER — Encounter

## 2025-08-04 ENCOUNTER — Encounter
